# Patient Record
Sex: Female | Born: 1945 | Race: White | Hispanic: No | Marital: Single | Smoking: Former smoker
Health system: Southern US, Community
[De-identification: ages and names within clinical notes are randomized; demographics above are authoritative.]

## PROBLEM LIST (undated history)

## (undated) DIAGNOSIS — I2510 Atherosclerotic heart disease of native coronary artery without angina pectoris: Secondary | ICD-10-CM

## (undated) DIAGNOSIS — E7800 Pure hypercholesterolemia, unspecified: Secondary | ICD-10-CM

## (undated) DIAGNOSIS — I509 Heart failure, unspecified: Secondary | ICD-10-CM

## (undated) DIAGNOSIS — L409 Psoriasis, unspecified: Secondary | ICD-10-CM

## (undated) DIAGNOSIS — E119 Type 2 diabetes mellitus without complications: Secondary | ICD-10-CM

## (undated) DIAGNOSIS — I10 Essential (primary) hypertension: Secondary | ICD-10-CM

## (undated) HISTORY — PX: BREAST SURGERY: SHX581

## (undated) HISTORY — PX: BREAST EXCISIONAL BIOPSY: SUR124

---

## 1999-08-14 DIAGNOSIS — I428 Other cardiomyopathies: Secondary | ICD-10-CM | POA: Insufficient documentation

## 1999-08-14 DIAGNOSIS — Z7984 Long term (current) use of oral hypoglycemic drugs: Secondary | ICD-10-CM | POA: Insufficient documentation

## 1999-08-14 DIAGNOSIS — Z6830 Body mass index (BMI) 30.0-30.9, adult: Secondary | ICD-10-CM | POA: Insufficient documentation

## 1999-08-14 DIAGNOSIS — Z87891 Personal history of nicotine dependence: Secondary | ICD-10-CM | POA: Insufficient documentation

## 1999-08-14 DIAGNOSIS — I051 Rheumatic mitral insufficiency: Secondary | ICD-10-CM | POA: Insufficient documentation

## 1999-08-14 DIAGNOSIS — I2510 Atherosclerotic heart disease of native coronary artery without angina pectoris: Secondary | ICD-10-CM | POA: Insufficient documentation

## 1999-08-14 DIAGNOSIS — Z7901 Long term (current) use of anticoagulants: Secondary | ICD-10-CM | POA: Insufficient documentation

## 2014-08-25 DIAGNOSIS — B353 Tinea pedis: Secondary | ICD-10-CM | POA: Diagnosis not present

## 2014-08-25 DIAGNOSIS — L409 Psoriasis, unspecified: Secondary | ICD-10-CM | POA: Diagnosis not present

## 2014-08-25 DIAGNOSIS — R4189 Other symptoms and signs involving cognitive functions and awareness: Secondary | ICD-10-CM | POA: Diagnosis not present

## 2014-08-25 DIAGNOSIS — B372 Candidiasis of skin and nail: Secondary | ICD-10-CM | POA: Diagnosis not present

## 2014-08-25 DIAGNOSIS — E785 Hyperlipidemia, unspecified: Secondary | ICD-10-CM | POA: Diagnosis not present

## 2014-08-25 DIAGNOSIS — I8310 Varicose veins of unspecified lower extremity with inflammation: Secondary | ICD-10-CM | POA: Diagnosis not present

## 2014-08-25 DIAGNOSIS — I10 Essential (primary) hypertension: Secondary | ICD-10-CM | POA: Diagnosis not present

## 2014-08-25 DIAGNOSIS — E119 Type 2 diabetes mellitus without complications: Secondary | ICD-10-CM | POA: Diagnosis not present

## 2014-08-25 DIAGNOSIS — Z1389 Encounter for screening for other disorder: Secondary | ICD-10-CM | POA: Diagnosis not present

## 2014-09-30 DIAGNOSIS — L400 Psoriasis vulgaris: Secondary | ICD-10-CM | POA: Diagnosis not present

## 2014-09-30 DIAGNOSIS — L814 Other melanin hyperpigmentation: Secondary | ICD-10-CM | POA: Diagnosis not present

## 2014-09-30 DIAGNOSIS — D1801 Hemangioma of skin and subcutaneous tissue: Secondary | ICD-10-CM | POA: Diagnosis not present

## 2014-09-30 DIAGNOSIS — L821 Other seborrheic keratosis: Secondary | ICD-10-CM | POA: Diagnosis not present

## 2014-10-13 DIAGNOSIS — Z111 Encounter for screening for respiratory tuberculosis: Secondary | ICD-10-CM | POA: Diagnosis not present

## 2014-10-22 DIAGNOSIS — E119 Type 2 diabetes mellitus without complications: Secondary | ICD-10-CM | POA: Diagnosis not present

## 2014-10-22 DIAGNOSIS — H35372 Puckering of macula, left eye: Secondary | ICD-10-CM | POA: Diagnosis not present

## 2014-10-22 DIAGNOSIS — H2513 Age-related nuclear cataract, bilateral: Secondary | ICD-10-CM | POA: Diagnosis not present

## 2014-10-28 DIAGNOSIS — L400 Psoriasis vulgaris: Secondary | ICD-10-CM | POA: Diagnosis not present

## 2014-10-28 DIAGNOSIS — Z79899 Other long term (current) drug therapy: Secondary | ICD-10-CM | POA: Diagnosis not present

## 2014-11-11 ENCOUNTER — Encounter (HOSPITAL_COMMUNITY): Payer: Self-pay | Admitting: Emergency Medicine

## 2014-11-11 ENCOUNTER — Emergency Department (HOSPITAL_COMMUNITY)
Admission: EM | Admit: 2014-11-11 | Discharge: 2014-11-11 | Disposition: A | Discharge status: Home / Self Care | Payer: Medicare Other | Attending: Emergency Medicine | Admitting: Emergency Medicine

## 2014-11-11 DIAGNOSIS — E780 Pure hypercholesterolemia: Secondary | ICD-10-CM | POA: Diagnosis not present

## 2014-11-11 DIAGNOSIS — Z79899 Other long term (current) drug therapy: Secondary | ICD-10-CM | POA: Diagnosis not present

## 2014-11-11 DIAGNOSIS — L509 Urticaria, unspecified: Secondary | ICD-10-CM | POA: Insufficient documentation

## 2014-11-11 DIAGNOSIS — Z7982 Long term (current) use of aspirin: Secondary | ICD-10-CM | POA: Insufficient documentation

## 2014-11-11 DIAGNOSIS — I10 Essential (primary) hypertension: Secondary | ICD-10-CM | POA: Insufficient documentation

## 2014-11-11 DIAGNOSIS — R6889 Other general symptoms and signs: Secondary | ICD-10-CM | POA: Diagnosis not present

## 2014-11-11 DIAGNOSIS — E119 Type 2 diabetes mellitus without complications: Secondary | ICD-10-CM | POA: Diagnosis not present

## 2014-11-11 DIAGNOSIS — R220 Localized swelling, mass and lump, head: Secondary | ICD-10-CM | POA: Diagnosis present

## 2014-11-11 HISTORY — DX: Essential (primary) hypertension: I10

## 2014-11-11 HISTORY — DX: Type 2 diabetes mellitus without complications: E11.9

## 2014-11-11 HISTORY — DX: Pure hypercholesterolemia, unspecified: E78.00

## 2014-11-11 MED ORDER — DIPHENHYDRAMINE HCL 25 MG PO CAPS
ORAL_CAPSULE | ORAL | Status: AC
  Filled 2014-11-11: qty 1

## 2014-11-11 MED ORDER — OXYCODONE-ACETAMINOPHEN 5-325 MG PO TABS: 1.0000 | ORAL_TABLET | ORAL | Status: DC | PRN

## 2014-11-11 MED ORDER — DEXAMETHASONE 4 MG PO TABS
12.0000 mg | ORAL_TABLET | Freq: Once | ORAL | Status: AC
  Administered 2014-11-11: 12 mg via ORAL
  Filled 2014-11-11: qty 3

## 2014-11-11 MED ORDER — FAMOTIDINE 20 MG PO TABS
20.0000 mg | ORAL_TABLET | Freq: Once | ORAL | Status: AC
  Administered 2014-11-11: 20 mg via ORAL
  Filled 2014-11-11: qty 1

## 2014-11-11 MED ORDER — DIPHENHYDRAMINE HCL 25 MG PO CAPS
50.0000 mg | ORAL_CAPSULE | Freq: Once | ORAL | Status: AC
  Administered 2014-11-11: 50 mg via ORAL
  Filled 2014-11-11: qty 2

## 2014-11-11 NOTE — ED Notes (Signed)
Bed: WA20 Expected date:  Expected time:  Means of arrival:  Comments: EMS 

## 2014-11-11 NOTE — ED Provider Notes (Signed)
CSN: [id number]     Arrival date & time 11/11/14  1144 History   First MD Initiated Contact with Patient 11/11/14 1202     Chief Complaint  Patient presents with  . Facial Swelling    Swelling and redness underneath eyes.  . Allergic Reaction     (Consider location/radiation/quality/duration/timing/severity/associated sxs/prior Treatment) HPI   68yF with rash and facial swelling. Onset shortly before arrival. No new exposures identified. Denies any change in visual acuity. No drainage or tearing. Denies any pain. Splotchy red rash over her body.No noticeable forearms. No cough, wheezing or shortness of breath. No dizziness or lightheadedness. Denies any abdominal pain, cramping nausea or diarrhea.   Past Medical History  Diagnosis Date  . Hypertension   . Diabetes mellitus without complication   . High cholesterol    Past Surgical History  Procedure Laterality Date  . Breast surgery     No family history on file. History  Substance Use Topics  . Smoking status: Never Smoker   . Smokeless tobacco: Not on file  . Alcohol Use: No   OB History    No data available     Review of Systems  All systems reviewed and negative, other than as noted in HPI.   Allergies  Review of patient's allergies indicates no known allergies.  Home Medications   Prior to Admission medications   Medication Sig Start Date End Date Taking? Authorizing Provider  aspirin 81 MG tablet Take 81 mg by mouth daily.   Yes Historical Provider, MD  atorvastatin (LIPITOR) 80 MG tablet Take 80 mg by mouth at bedtime.   Yes Historical Provider, MD  beta carotene w/minerals (OCUVITE) tablet Take 1 tablet by mouth daily.   Yes Historical Provider, MD  diphenhydrAMINE (BENADRYL) 25 MG tablet Take 50 mg by mouth every 6 (six) hours as needed for allergies (allergic reaction).   Yes Historical Provider, MD  lisinopril (PRINIVIL,ZESTRIL) 20 MG tablet Take 20 mg by mouth at bedtime.   Yes Historical Provider, MD   metFORMIN (GLUCOPHAGE) 500 MG tablet Take 500 mg by mouth 2 (two) times daily with a meal.   Yes Historical Provider, MD  Multiple Vitamins-Minerals (CENTRUM SILVER ADULT 50+ PO) Take 1 tablet by mouth daily.   Yes Historical Provider, MD   BP 157/85 mmHg  Pulse 73  Temp(Src) 97.9 F (36.6 C) (Oral)  Resp 18  SpO2 98% Physical Exam  Constitutional: She appears well-developed and well-nourished. No distress.  HENT:  Head: Normocephalic and atraumatic.  Eyes: Right eye exhibits no discharge. Left eye exhibits no discharge.  Puffiness of both eyes. Splotchy erythema of face. Conjunctiva clear. No drainage. EOMI.   Neck: Neck supple.  Cardiovascular: Normal rate, regular rhythm and normal heart sounds.  Exam reveals no gallop and no friction rub.   No murmur heard. Pulmonary/Chest: Effort normal and breath sounds normal. No respiratory distress.  Abdominal: Soft. She exhibits no distension. There is no tenderness.  Musculoskeletal: She exhibits no edema or tenderness.  Neurological: She is alert.  Skin: Skin is warm and dry. Rash noted.  Diffuse urticarial rash.   Psychiatric: She has a normal mood and affect. Her behavior is normal. Thought content normal.  Nursing note and vitals reviewed.   ED Course  Procedures (including critical care time) Labs Review Labs Reviewed - No data to display  Imaging Review No results found.   EKG Interpretation None      MDM   Final diagnoses:  Urticarial rash  68yF with urticarial rash. No gi complaints. No respiratory complaints. HD stable. Symptomatic tx. No signs of serious reaction.     Virgel Manifold, MD 11/14/14 [phone]

## 2014-11-11 NOTE — ED Notes (Signed)
According to pt, pt was given benadryl en route by EMS.

## 2014-11-11 NOTE — ED Notes (Signed)
Upon assessment, pt has redness spreading up R arm. Pt also has red spots on L arm beginning to spread. Pt denies itchiness or pain.

## 2014-11-11 NOTE — ED Notes (Addendum)
Per EMS, Pt arrived at the adult [facility] for [facility] and seen by staff due to redness and swelling to both of her eyes. Called EMS for possible allergic reaction. Pt denies any new soaps, perfumes, etc. Pt denies recent sickness. Pt has no complaints or pain. A&Ox4. Pt ambulatory to stretcher. Pt sts she woke up this way.

## 2015-01-25 DIAGNOSIS — E119 Type 2 diabetes mellitus without complications: Secondary | ICD-10-CM | POA: Diagnosis not present

## 2015-01-25 DIAGNOSIS — H35372 Puckering of macula, left eye: Secondary | ICD-10-CM | POA: Diagnosis not present

## 2015-01-31 DIAGNOSIS — L400 Psoriasis vulgaris: Secondary | ICD-10-CM | POA: Diagnosis not present

## 2015-01-31 DIAGNOSIS — Z79899 Other long term (current) drug therapy: Secondary | ICD-10-CM | POA: Diagnosis not present

## 2015-02-07 DIAGNOSIS — H2513 Age-related nuclear cataract, bilateral: Secondary | ICD-10-CM | POA: Diagnosis not present

## 2015-02-07 DIAGNOSIS — H35372 Puckering of macula, left eye: Secondary | ICD-10-CM | POA: Diagnosis not present

## 2015-02-07 DIAGNOSIS — H1859 Other hereditary corneal dystrophies: Secondary | ICD-10-CM | POA: Diagnosis not present

## 2015-02-07 DIAGNOSIS — H04123 Dry eye syndrome of bilateral lacrimal glands: Secondary | ICD-10-CM | POA: Diagnosis not present

## 2015-02-24 DIAGNOSIS — H2511 Age-related nuclear cataract, right eye: Secondary | ICD-10-CM | POA: Diagnosis not present

## 2015-02-25 DIAGNOSIS — E119 Type 2 diabetes mellitus without complications: Secondary | ICD-10-CM | POA: Diagnosis not present

## 2015-02-25 DIAGNOSIS — L409 Psoriasis, unspecified: Secondary | ICD-10-CM | POA: Diagnosis not present

## 2015-02-25 DIAGNOSIS — R4189 Other symptoms and signs involving cognitive functions and awareness: Secondary | ICD-10-CM | POA: Diagnosis not present

## 2015-02-25 DIAGNOSIS — E785 Hyperlipidemia, unspecified: Secondary | ICD-10-CM | POA: Diagnosis not present

## 2015-02-25 DIAGNOSIS — I10 Essential (primary) hypertension: Secondary | ICD-10-CM | POA: Diagnosis not present

## 2015-04-22 DIAGNOSIS — B372 Candidiasis of skin and nail: Secondary | ICD-10-CM | POA: Diagnosis not present

## 2015-04-22 DIAGNOSIS — R42 Dizziness and giddiness: Secondary | ICD-10-CM | POA: Diagnosis not present

## 2015-05-02 DIAGNOSIS — L304 Erythema intertrigo: Secondary | ICD-10-CM | POA: Diagnosis not present

## 2015-05-02 DIAGNOSIS — L400 Psoriasis vulgaris: Secondary | ICD-10-CM | POA: Diagnosis not present

## 2015-05-24 DIAGNOSIS — Z23 Encounter for immunization: Secondary | ICD-10-CM | POA: Diagnosis not present

## 2015-08-01 DIAGNOSIS — L304 Erythema intertrigo: Secondary | ICD-10-CM | POA: Diagnosis not present

## 2015-08-01 DIAGNOSIS — L400 Psoriasis vulgaris: Secondary | ICD-10-CM | POA: Diagnosis not present

## 2015-08-30 DIAGNOSIS — Z0000 Encounter for general adult medical examination without abnormal findings: Secondary | ICD-10-CM | POA: Diagnosis not present

## 2015-08-30 DIAGNOSIS — R4189 Other symptoms and signs involving cognitive functions and awareness: Secondary | ICD-10-CM | POA: Diagnosis not present

## 2015-08-30 DIAGNOSIS — B372 Candidiasis of skin and nail: Secondary | ICD-10-CM | POA: Diagnosis not present

## 2015-08-30 DIAGNOSIS — E119 Type 2 diabetes mellitus without complications: Secondary | ICD-10-CM | POA: Diagnosis not present

## 2015-08-30 DIAGNOSIS — L409 Psoriasis, unspecified: Secondary | ICD-10-CM | POA: Diagnosis not present

## 2015-08-30 DIAGNOSIS — Z1211 Encounter for screening for malignant neoplasm of colon: Secondary | ICD-10-CM | POA: Diagnosis not present

## 2015-08-30 DIAGNOSIS — Z1389 Encounter for screening for other disorder: Secondary | ICD-10-CM | POA: Diagnosis not present

## 2015-08-30 DIAGNOSIS — E785 Hyperlipidemia, unspecified: Secondary | ICD-10-CM | POA: Diagnosis not present

## 2015-08-30 DIAGNOSIS — Z124 Encounter for screening for malignant neoplasm of cervix: Secondary | ICD-10-CM | POA: Diagnosis not present

## 2015-08-30 DIAGNOSIS — I10 Essential (primary) hypertension: Secondary | ICD-10-CM | POA: Diagnosis not present

## 2015-09-01 ENCOUNTER — Other Ambulatory Visit: Payer: Self-pay

## 2015-09-01 DIAGNOSIS — Z1231 Encounter for screening mammogram for malignant neoplasm of breast: Secondary | ICD-10-CM

## 2015-09-26 DIAGNOSIS — Z7984 Long term (current) use of oral hypoglycemic drugs: Secondary | ICD-10-CM | POA: Diagnosis not present

## 2015-09-26 DIAGNOSIS — E119 Type 2 diabetes mellitus without complications: Secondary | ICD-10-CM | POA: Diagnosis not present

## 2015-09-26 DIAGNOSIS — Z1211 Encounter for screening for malignant neoplasm of colon: Secondary | ICD-10-CM | POA: Diagnosis not present

## 2015-10-18 DIAGNOSIS — Z1211 Encounter for screening for malignant neoplasm of colon: Secondary | ICD-10-CM | POA: Diagnosis not present

## 2015-10-18 DIAGNOSIS — Z644 Discord with counselors: Secondary | ICD-10-CM | POA: Diagnosis not present

## 2015-10-31 ENCOUNTER — Ambulatory Visit
Admission: RE | Admit: 2015-10-31 | Discharge: 2015-10-31 | Disposition: A | Discharge status: Home / Self Care | Payer: Medicare Other | Source: Ambulatory Visit

## 2015-10-31 DIAGNOSIS — Z1231 Encounter for screening mammogram for malignant neoplasm of breast: Secondary | ICD-10-CM | POA: Diagnosis not present

## 2015-10-31 DIAGNOSIS — L304 Erythema intertrigo: Secondary | ICD-10-CM | POA: Diagnosis not present

## 2015-10-31 DIAGNOSIS — Z79899 Other long term (current) drug therapy: Secondary | ICD-10-CM | POA: Diagnosis not present

## 2015-10-31 DIAGNOSIS — L400 Psoriasis vulgaris: Secondary | ICD-10-CM | POA: Diagnosis not present

## 2015-12-05 DIAGNOSIS — Z961 Presence of intraocular lens: Secondary | ICD-10-CM | POA: Diagnosis not present

## 2015-12-05 DIAGNOSIS — H35372 Puckering of macula, left eye: Secondary | ICD-10-CM | POA: Diagnosis not present

## 2015-12-05 DIAGNOSIS — H1859 Other hereditary corneal dystrophies: Secondary | ICD-10-CM | POA: Diagnosis not present

## 2015-12-05 DIAGNOSIS — H2512 Age-related nuclear cataract, left eye: Secondary | ICD-10-CM | POA: Diagnosis not present

## 2015-12-17 DIAGNOSIS — N390 Urinary tract infection, site not specified: Secondary | ICD-10-CM | POA: Diagnosis not present

## 2015-12-17 DIAGNOSIS — R300 Dysuria: Secondary | ICD-10-CM | POA: Diagnosis not present

## 2016-02-02 DIAGNOSIS — Z79899 Other long term (current) drug therapy: Secondary | ICD-10-CM | POA: Diagnosis not present

## 2016-02-02 DIAGNOSIS — L400 Psoriasis vulgaris: Secondary | ICD-10-CM | POA: Diagnosis not present

## 2016-03-05 DIAGNOSIS — L409 Psoriasis, unspecified: Secondary | ICD-10-CM | POA: Diagnosis not present

## 2016-03-05 DIAGNOSIS — R4189 Other symptoms and signs involving cognitive functions and awareness: Secondary | ICD-10-CM | POA: Diagnosis not present

## 2016-03-05 DIAGNOSIS — E119 Type 2 diabetes mellitus without complications: Secondary | ICD-10-CM | POA: Diagnosis not present

## 2016-03-05 DIAGNOSIS — E785 Hyperlipidemia, unspecified: Secondary | ICD-10-CM | POA: Diagnosis not present

## 2016-03-05 DIAGNOSIS — I10 Essential (primary) hypertension: Secondary | ICD-10-CM | POA: Diagnosis not present

## 2016-03-05 DIAGNOSIS — Z7984 Long term (current) use of oral hypoglycemic drugs: Secondary | ICD-10-CM | POA: Diagnosis not present

## 2016-03-05 DIAGNOSIS — R21 Rash and other nonspecific skin eruption: Secondary | ICD-10-CM | POA: Diagnosis not present

## 2016-03-20 DIAGNOSIS — L400 Psoriasis vulgaris: Secondary | ICD-10-CM | POA: Diagnosis not present

## 2016-05-01 DIAGNOSIS — L400 Psoriasis vulgaris: Secondary | ICD-10-CM | POA: Diagnosis not present

## 2016-05-01 DIAGNOSIS — Z79899 Other long term (current) drug therapy: Secondary | ICD-10-CM | POA: Diagnosis not present

## 2016-06-11 DIAGNOSIS — Z23 Encounter for immunization: Secondary | ICD-10-CM | POA: Diagnosis not present

## 2016-06-18 DIAGNOSIS — Z1212 Encounter for screening for malignant neoplasm of rectum: Secondary | ICD-10-CM | POA: Diagnosis not present

## 2016-06-18 DIAGNOSIS — Z1211 Encounter for screening for malignant neoplasm of colon: Secondary | ICD-10-CM | POA: Diagnosis not present

## 2016-07-31 DIAGNOSIS — R195 Other fecal abnormalities: Secondary | ICD-10-CM | POA: Diagnosis not present

## 2016-08-01 DIAGNOSIS — Z79899 Other long term (current) drug therapy: Secondary | ICD-10-CM | POA: Diagnosis not present

## 2016-08-01 DIAGNOSIS — L400 Psoriasis vulgaris: Secondary | ICD-10-CM | POA: Diagnosis not present

## 2016-08-29 DIAGNOSIS — K644 Residual hemorrhoidal skin tags: Secondary | ICD-10-CM | POA: Diagnosis not present

## 2016-08-29 DIAGNOSIS — K648 Other hemorrhoids: Secondary | ICD-10-CM | POA: Diagnosis not present

## 2016-08-29 DIAGNOSIS — Q438 Other specified congenital malformations of intestine: Secondary | ICD-10-CM | POA: Diagnosis not present

## 2016-08-29 DIAGNOSIS — Z538 Procedure and treatment not carried out for other reasons: Secondary | ICD-10-CM | POA: Diagnosis not present

## 2016-08-29 DIAGNOSIS — R195 Other fecal abnormalities: Secondary | ICD-10-CM | POA: Diagnosis not present

## 2016-09-10 DIAGNOSIS — Z0000 Encounter for general adult medical examination without abnormal findings: Secondary | ICD-10-CM | POA: Diagnosis not present

## 2016-09-10 DIAGNOSIS — Z1389 Encounter for screening for other disorder: Secondary | ICD-10-CM | POA: Diagnosis not present

## 2016-09-10 DIAGNOSIS — R4189 Other symptoms and signs involving cognitive functions and awareness: Secondary | ICD-10-CM | POA: Diagnosis not present

## 2016-09-10 DIAGNOSIS — L409 Psoriasis, unspecified: Secondary | ICD-10-CM | POA: Diagnosis not present

## 2016-09-10 DIAGNOSIS — M25561 Pain in right knee: Secondary | ICD-10-CM | POA: Diagnosis not present

## 2016-09-10 DIAGNOSIS — E119 Type 2 diabetes mellitus without complications: Secondary | ICD-10-CM | POA: Diagnosis not present

## 2016-09-10 DIAGNOSIS — E785 Hyperlipidemia, unspecified: Secondary | ICD-10-CM | POA: Diagnosis not present

## 2016-09-10 DIAGNOSIS — I10 Essential (primary) hypertension: Secondary | ICD-10-CM | POA: Diagnosis not present

## 2016-09-10 DIAGNOSIS — I8310 Varicose veins of unspecified lower extremity with inflammation: Secondary | ICD-10-CM | POA: Diagnosis not present

## 2016-09-10 DIAGNOSIS — M85852 Other specified disorders of bone density and structure, left thigh: Secondary | ICD-10-CM | POA: Diagnosis not present

## 2016-09-25 ENCOUNTER — Other Ambulatory Visit: Payer: Self-pay | Admitting: Family Medicine

## 2016-09-25 ENCOUNTER — Other Ambulatory Visit: Payer: Self-pay

## 2016-09-25 DIAGNOSIS — Z1231 Encounter for screening mammogram for malignant neoplasm of breast: Secondary | ICD-10-CM

## 2016-10-23 DIAGNOSIS — M8588 Other specified disorders of bone density and structure, other site: Secondary | ICD-10-CM | POA: Diagnosis not present

## 2016-10-23 DIAGNOSIS — R195 Other fecal abnormalities: Secondary | ICD-10-CM | POA: Diagnosis not present

## 2016-10-29 DIAGNOSIS — Z79899 Other long term (current) drug therapy: Secondary | ICD-10-CM | POA: Diagnosis not present

## 2016-10-29 DIAGNOSIS — L400 Psoriasis vulgaris: Secondary | ICD-10-CM | POA: Diagnosis not present

## 2016-10-29 DIAGNOSIS — L570 Actinic keratosis: Secondary | ICD-10-CM | POA: Diagnosis not present

## 2016-10-29 DIAGNOSIS — D485 Neoplasm of uncertain behavior of skin: Secondary | ICD-10-CM | POA: Diagnosis not present

## 2016-11-05 ENCOUNTER — Ambulatory Visit
Admission: RE | Admit: 2016-11-05 | Discharge: 2016-11-05 | Disposition: A | Discharge status: Home / Self Care | Payer: Medicare Other | Source: Ambulatory Visit | Attending: Family Medicine | Admitting: Family Medicine

## 2016-11-05 DIAGNOSIS — M85852 Other specified disorders of bone density and structure, left thigh: Secondary | ICD-10-CM | POA: Diagnosis not present

## 2016-11-05 DIAGNOSIS — Z1231 Encounter for screening mammogram for malignant neoplasm of breast: Secondary | ICD-10-CM | POA: Diagnosis not present

## 2016-12-17 DIAGNOSIS — H1859 Other hereditary corneal dystrophies: Secondary | ICD-10-CM | POA: Diagnosis not present

## 2016-12-17 DIAGNOSIS — Z961 Presence of intraocular lens: Secondary | ICD-10-CM | POA: Diagnosis not present

## 2016-12-17 DIAGNOSIS — E119 Type 2 diabetes mellitus without complications: Secondary | ICD-10-CM | POA: Diagnosis not present

## 2016-12-17 DIAGNOSIS — H35372 Puckering of macula, left eye: Secondary | ICD-10-CM | POA: Diagnosis not present

## 2016-12-17 DIAGNOSIS — H2512 Age-related nuclear cataract, left eye: Secondary | ICD-10-CM | POA: Diagnosis not present

## 2017-01-28 DIAGNOSIS — D1801 Hemangioma of skin and subcutaneous tissue: Secondary | ICD-10-CM | POA: Diagnosis not present

## 2017-01-28 DIAGNOSIS — D225 Melanocytic nevi of trunk: Secondary | ICD-10-CM | POA: Diagnosis not present

## 2017-01-28 DIAGNOSIS — D2272 Melanocytic nevi of left lower limb, including hip: Secondary | ICD-10-CM | POA: Diagnosis not present

## 2017-01-28 DIAGNOSIS — L400 Psoriasis vulgaris: Secondary | ICD-10-CM | POA: Diagnosis not present

## 2017-01-28 DIAGNOSIS — L814 Other melanin hyperpigmentation: Secondary | ICD-10-CM | POA: Diagnosis not present

## 2017-01-28 DIAGNOSIS — L821 Other seborrheic keratosis: Secondary | ICD-10-CM | POA: Diagnosis not present

## 2017-02-19 DIAGNOSIS — K625 Hemorrhage of anus and rectum: Secondary | ICD-10-CM | POA: Diagnosis not present

## 2017-02-19 DIAGNOSIS — K602 Anal fissure, unspecified: Secondary | ICD-10-CM | POA: Diagnosis not present

## 2017-02-19 DIAGNOSIS — R195 Other fecal abnormalities: Secondary | ICD-10-CM | POA: Diagnosis not present

## 2017-02-20 DIAGNOSIS — I10 Essential (primary) hypertension: Secondary | ICD-10-CM | POA: Diagnosis not present

## 2017-02-20 DIAGNOSIS — E119 Type 2 diabetes mellitus without complications: Secondary | ICD-10-CM | POA: Diagnosis not present

## 2017-02-20 DIAGNOSIS — R21 Rash and other nonspecific skin eruption: Secondary | ICD-10-CM | POA: Diagnosis not present

## 2017-02-20 DIAGNOSIS — M85852 Other specified disorders of bone density and structure, left thigh: Secondary | ICD-10-CM | POA: Diagnosis not present

## 2017-02-20 DIAGNOSIS — E559 Vitamin D deficiency, unspecified: Secondary | ICD-10-CM | POA: Diagnosis not present

## 2017-02-20 DIAGNOSIS — G629 Polyneuropathy, unspecified: Secondary | ICD-10-CM | POA: Diagnosis not present

## 2017-02-20 DIAGNOSIS — Z7984 Long term (current) use of oral hypoglycemic drugs: Secondary | ICD-10-CM | POA: Diagnosis not present

## 2017-02-20 DIAGNOSIS — L409 Psoriasis, unspecified: Secondary | ICD-10-CM | POA: Diagnosis not present

## 2017-02-20 DIAGNOSIS — E785 Hyperlipidemia, unspecified: Secondary | ICD-10-CM | POA: Diagnosis not present

## 2017-02-20 DIAGNOSIS — R195 Other fecal abnormalities: Secondary | ICD-10-CM | POA: Diagnosis not present

## 2017-02-20 DIAGNOSIS — R4189 Other symptoms and signs involving cognitive functions and awareness: Secondary | ICD-10-CM | POA: Diagnosis not present

## 2017-02-21 DIAGNOSIS — I10 Essential (primary) hypertension: Secondary | ICD-10-CM | POA: Diagnosis not present

## 2017-02-21 DIAGNOSIS — G629 Polyneuropathy, unspecified: Secondary | ICD-10-CM | POA: Diagnosis not present

## 2017-02-21 DIAGNOSIS — E785 Hyperlipidemia, unspecified: Secondary | ICD-10-CM | POA: Diagnosis not present

## 2017-02-21 DIAGNOSIS — R4189 Other symptoms and signs involving cognitive functions and awareness: Secondary | ICD-10-CM | POA: Diagnosis not present

## 2017-02-21 DIAGNOSIS — K5909 Other constipation: Secondary | ICD-10-CM | POA: Diagnosis not present

## 2017-02-21 DIAGNOSIS — K625 Hemorrhage of anus and rectum: Secondary | ICD-10-CM | POA: Diagnosis not present

## 2017-02-21 DIAGNOSIS — M85852 Other specified disorders of bone density and structure, left thigh: Secondary | ICD-10-CM | POA: Diagnosis not present

## 2017-02-21 DIAGNOSIS — Z7984 Long term (current) use of oral hypoglycemic drugs: Secondary | ICD-10-CM | POA: Diagnosis not present

## 2017-02-21 DIAGNOSIS — L409 Psoriasis, unspecified: Secondary | ICD-10-CM | POA: Diagnosis not present

## 2017-02-21 DIAGNOSIS — E119 Type 2 diabetes mellitus without complications: Secondary | ICD-10-CM | POA: Diagnosis not present

## 2017-02-21 DIAGNOSIS — E559 Vitamin D deficiency, unspecified: Secondary | ICD-10-CM | POA: Diagnosis not present

## 2017-04-23 DIAGNOSIS — E119 Type 2 diabetes mellitus without complications: Secondary | ICD-10-CM | POA: Diagnosis not present

## 2017-04-23 DIAGNOSIS — K625 Hemorrhage of anus and rectum: Secondary | ICD-10-CM | POA: Diagnosis not present

## 2017-04-23 DIAGNOSIS — K5909 Other constipation: Secondary | ICD-10-CM | POA: Diagnosis not present

## 2017-04-23 DIAGNOSIS — I10 Essential (primary) hypertension: Secondary | ICD-10-CM | POA: Diagnosis not present

## 2017-04-23 DIAGNOSIS — G629 Polyneuropathy, unspecified: Secondary | ICD-10-CM | POA: Diagnosis not present

## 2017-04-23 DIAGNOSIS — E559 Vitamin D deficiency, unspecified: Secondary | ICD-10-CM | POA: Diagnosis not present

## 2017-04-23 DIAGNOSIS — Z7984 Long term (current) use of oral hypoglycemic drugs: Secondary | ICD-10-CM | POA: Diagnosis not present

## 2017-04-23 DIAGNOSIS — R4189 Other symptoms and signs involving cognitive functions and awareness: Secondary | ICD-10-CM | POA: Diagnosis not present

## 2017-04-23 DIAGNOSIS — L409 Psoriasis, unspecified: Secondary | ICD-10-CM | POA: Diagnosis not present

## 2017-04-23 DIAGNOSIS — M85852 Other specified disorders of bone density and structure, left thigh: Secondary | ICD-10-CM | POA: Diagnosis not present

## 2017-04-23 DIAGNOSIS — E785 Hyperlipidemia, unspecified: Secondary | ICD-10-CM | POA: Diagnosis not present

## 2017-04-29 DIAGNOSIS — L400 Psoriasis vulgaris: Secondary | ICD-10-CM | POA: Diagnosis not present

## 2017-04-29 DIAGNOSIS — Z79899 Other long term (current) drug therapy: Secondary | ICD-10-CM | POA: Diagnosis not present

## 2017-04-30 ENCOUNTER — Emergency Department (HOSPITAL_COMMUNITY)
Admission: EM | Admit: 2017-04-30 | Discharge: 2017-05-01 | Disposition: A | Discharge status: Home / Self Care | Payer: Medicare Other | Attending: Emergency Medicine | Admitting: Emergency Medicine

## 2017-04-30 ENCOUNTER — Emergency Department (HOSPITAL_COMMUNITY): Payer: Medicare Other

## 2017-04-30 ENCOUNTER — Encounter (HOSPITAL_COMMUNITY): Payer: Self-pay | Admitting: *Deleted

## 2017-04-30 DIAGNOSIS — E860 Dehydration: Secondary | ICD-10-CM | POA: Diagnosis not present

## 2017-04-30 DIAGNOSIS — I480 Paroxysmal atrial fibrillation: Secondary | ICD-10-CM | POA: Insufficient documentation

## 2017-04-30 DIAGNOSIS — Z79899 Other long term (current) drug therapy: Secondary | ICD-10-CM | POA: Diagnosis not present

## 2017-04-30 DIAGNOSIS — I10 Essential (primary) hypertension: Secondary | ICD-10-CM | POA: Diagnosis not present

## 2017-04-30 DIAGNOSIS — I959 Hypotension, unspecified: Secondary | ICD-10-CM | POA: Diagnosis not present

## 2017-04-30 DIAGNOSIS — R000 Tachycardia, unspecified: Secondary | ICD-10-CM | POA: Diagnosis not present

## 2017-04-30 DIAGNOSIS — E119 Type 2 diabetes mellitus without complications: Secondary | ICD-10-CM | POA: Insufficient documentation

## 2017-04-30 DIAGNOSIS — I4891 Unspecified atrial fibrillation: Secondary | ICD-10-CM | POA: Diagnosis not present

## 2017-04-30 DIAGNOSIS — Z7982 Long term (current) use of aspirin: Secondary | ICD-10-CM | POA: Diagnosis not present

## 2017-04-30 DIAGNOSIS — Z7984 Long term (current) use of oral hypoglycemic drugs: Secondary | ICD-10-CM | POA: Insufficient documentation

## 2017-04-30 DIAGNOSIS — R197 Diarrhea, unspecified: Secondary | ICD-10-CM | POA: Diagnosis not present

## 2017-04-30 LAB — BASIC METABOLIC PANEL
Anion gap: 11 (ref 5–15)
BUN: 20 mg/dL (ref 6–20)
CO2: 24 mmol/L (ref 22–32)
CREATININE: 1.19 mg/dL — AB (ref 0.44–1.00)
Calcium: 9.5 mg/dL (ref 8.9–10.3)
Chloride: 105 mmol/L (ref 101–111)
GFR calc non Af Amer: 45 mL/min — ABNORMAL LOW (ref >60)
GFR, EST AFRICAN AMERICAN: 52 mL/min — AB (ref >60)
Glucose, Bld: 163 mg/dL — ABNORMAL HIGH (ref 65–99)
POTASSIUM: 4 mmol/L (ref 3.5–5.1)
Sodium: 140 mmol/L (ref 135–145)

## 2017-04-30 LAB — CBC
HCT: 41.2 % (ref 36.0–46.0)
Hemoglobin: 13.7 g/dL (ref 12.0–15.0)
MCH: 29 pg (ref 26.0–34.0)
MCHC: 33.3 g/dL (ref 30.0–36.0)
MCV: 87.1 fL (ref 78.0–100.0)
Platelets: 265 10*3/uL (ref 150–400)
RBC: 4.73 MIL/uL (ref 3.87–5.11)
RDW: 13.3 % (ref 11.5–15.5)
WBC: 11.3 10*3/uL — ABNORMAL HIGH (ref 4.0–10.5)

## 2017-04-30 LAB — URINALYSIS, ROUTINE W REFLEX MICROSCOPIC
Bilirubin Urine: NEGATIVE
GLUCOSE, UA: NEGATIVE mg/dL
HGB URINE DIPSTICK: NEGATIVE
KETONES UR: 5 mg/dL — AB
Leukocytes, UA: NEGATIVE
Nitrite: NEGATIVE
Protein, ur: NEGATIVE mg/dL
Specific Gravity, Urine: 1.014 (ref 1.005–1.030)
pH: 5 (ref 5.0–8.0)

## 2017-04-30 LAB — LIPASE, BLOOD: LIPASE: 23 U/L (ref 11–51)

## 2017-04-30 LAB — I-STAT TROPONIN, ED
TROPONIN I, POC: 0.06 ng/mL (ref 0.00–0.08)
Troponin i, poc: 0.06 ng/mL (ref 0.00–0.08)

## 2017-04-30 LAB — HEPATIC FUNCTION PANEL
ALBUMIN: 3.5 g/dL (ref 3.5–5.0)
ALK PHOS: 56 U/L (ref 38–126)
ALT: 17 U/L (ref 14–54)
AST: 19 U/L (ref 15–41)
Bilirubin, Direct: 0.1 mg/dL (ref 0.1–0.5)
Indirect Bilirubin: 0.7 mg/dL (ref 0.3–0.9)
Total Bilirubin: 0.8 mg/dL (ref 0.3–1.2)
Total Protein: 5.9 g/dL — ABNORMAL LOW (ref 6.5–8.1)

## 2017-04-30 LAB — PROTIME-INR
INR: 1
Prothrombin Time: 13.1 seconds (ref 11.4–15.2)

## 2017-04-30 LAB — CBG MONITORING, ED: Glucose-Capillary: 142 mg/dL — ABNORMAL HIGH (ref 65–99)

## 2017-04-30 LAB — MAGNESIUM: MAGNESIUM: 1.9 mg/dL (ref 1.7–2.4)

## 2017-04-30 MED ORDER — RIVAROXABAN 15 MG PO TABS: 15.0000 mg | ORAL_TABLET | Freq: Every day | ORAL | Status: DC

## 2017-04-30 MED ORDER — SODIUM CHLORIDE 0.9 % IV BOLUS (SEPSIS)
1000.0000 mL | Freq: Once | INTRAVENOUS | Status: AC
  Administered 2017-04-30: 1000 mL via INTRAVENOUS

## 2017-04-30 NOTE — Discharge Instructions (Signed)
When you arrive today, you were in atrial fibrillation with RVR. This is the fast rhythm. We suspect it was due to your diarrhea and dehydration. We did not find evidence of other infections tonight. We discussed starting you on a blood thinner however, you did not what to stay and have the pharmacist begin that treatment. Please follow-up with your primary care physician in the next several days for reassessment and to discuss anticoagulation needs. If any symptoms change or worsen or you have return of your fast heart rate, please return to the nearest emergency department.

## 2017-04-30 NOTE — ED Triage Notes (Signed)
Pt is here with diarrhea and dehydration.  Pt is here with high heart rate, hypotension, dizziness.  Pt has right sided abdominal pain.  Pt going straight to ED room

## 2017-04-30 NOTE — ED Provider Notes (Signed)
[facility] DEPT Provider Note   CSN: [id number] Arrival date & time: 04/30/17  1545     History   Chief Complaint Chief Complaint  Patient presents with  . Abdominal Pain  . Weakness  . Dizziness    HPI Melissa Bishop is a 71 y.o. female.  The history is provided by the patient, a relative and medical records.  Diarrhea   This is a new problem. The current episode started more than 2 days ago. The problem occurs 5 to 10 times per day. The problem has not changed since onset.The stool consistency is described as watery. There has been no fever. Associated symptoms include abdominal pain, chills and sweats. Pertinent negatives include no vomiting, no headaches, no URI and no cough. She has tried nothing for the symptoms.    Past Medical History:  Diagnosis Date  . Diabetes mellitus without complication ([place])   . High cholesterol   . Hypertension     There are no active problems to display for this patient.   Past Surgical History:  Procedure Laterality Date  . BREAST EXCISIONAL BIOPSY Right ? more than 30 years  . BREAST SURGERY      OB History    No data available       Home Medications    Prior to Admission medications   Medication Sig Start Date End Date Taking? Authorizing Provider  aspirin 81 MG tablet Take 81 mg by mouth daily.    [provider]  atorvastatin (LIPITOR) 80 MG tablet Take 80 mg by mouth at bedtime.    [provider]  beta carotene w/minerals (OCUVITE) tablet Take 1 tablet by mouth daily.    [provider]  diphenhydrAMINE (BENADRYL) 25 MG tablet Take 50 mg by mouth every 6 (six) hours as needed for allergies (allergic reaction).    [provider]  lisinopril (PRINIVIL,ZESTRIL) 20 MG tablet Take 20 mg by mouth at bedtime.    [provider]  metFORMIN (GLUCOPHAGE) 500 MG tablet Take 500 mg by mouth 2 (two) times daily with a meal.    [provider]  Multiple Vitamins-Minerals  (CENTRUM SILVER ADULT 50+ PO) Take 1 tablet by mouth daily.    [provider]  oxyCODONE-acetaminophen (PERCOCET/ROXICET) 5-325 MG per tablet Take 1-2 tablets by mouth every 4 (four) hours as needed for severe pain. 11/11/14   Virgel Manifold, MD    Family History No family history on file.  Social History Social History  Substance Use Topics  . Smoking status: Never Smoker  . Smokeless tobacco: Never Used  . Alcohol use No     Allergies   Patient has no known allergies.   Review of Systems Review of Systems  Constitutional: Positive for chills, diaphoresis and fatigue. Negative for fever.  HENT: Negative for congestion.   Respiratory: Negative for cough, chest tightness, shortness of breath, wheezing and stridor.   Cardiovascular: Negative for chest pain, palpitations and leg swelling.  Gastrointestinal: Positive for abdominal pain, diarrhea and nausea. Negative for constipation and vomiting.  Genitourinary: Negative for enuresis and flank pain.  Musculoskeletal: Negative for back pain and neck pain.  Skin: Negative for rash.  Neurological: Positive for light-headedness. Negative for weakness, numbness and headaches.  Psychiatric/Behavioral: Negative for agitation and confusion.  All other systems reviewed and are negative.    Physical Exam Updated Vital Signs BP 105/65 (BP Location: Left Arm)   Pulse (!) 169   Temp (!) 97.4 F (36.3 C) (Oral)   Resp  18   SpO2 95%   Physical Exam  Constitutional: She appears well-developed and well-nourished. No distress.  HENT:  Head: Normocephalic and atraumatic.  Mouth/Throat: Oropharynx is clear and moist. No oropharyngeal exudate.  Eyes: Pupils are equal, round, and reactive to light. Conjunctivae are normal.  Neck: Normal range of motion. Neck supple.  Cardiovascular: Intact distal pulses.  An irregularly irregular rhythm present. Tachycardia present.   No murmur heard. A. fib with RVR on monitor and EKG.    Pulmonary/Chest: Effort normal and breath sounds normal. No respiratory distress. She has no wheezes. She exhibits no tenderness.  Abdominal: Soft. There is no tenderness.  Musculoskeletal: She exhibits no edema or tenderness.  Neurological: She is alert. No sensory deficit. She exhibits normal muscle tone.  Skin: Skin is warm and dry. Capillary refill takes less than 2 seconds. No rash noted. She is not diaphoretic. No erythema.  Psychiatric: She has a normal mood and affect.  Nursing note and vitals reviewed.    ED Treatments / Results  Labs (all labs ordered are listed, but only abnormal results are displayed) Labs Reviewed  BASIC METABOLIC PANEL - Abnormal; Notable for the following:       Result Value   Glucose, Bld 163 (*)    Creatinine, Ser 1.19 (*)    GFR calc non Af Amer 45 (*)    GFR calc Af Amer 52 (*)    All other components within normal limits  CBC - Abnormal; Notable for the following:    WBC 11.3 (*)    All other components within normal limits  URINALYSIS, ROUTINE W REFLEX MICROSCOPIC - Abnormal; Notable for the following:    Ketones, ur 5 (*)    All other components within normal limits  HEPATIC FUNCTION PANEL - Abnormal; Notable for the following:    Total Protein 5.9 (*)    All other components within normal limits  CBG MONITORING, ED - Abnormal; Notable for the following:    Glucose-Capillary 142 (*)    All other components within normal limits  URINE CULTURE  PROTIME-INR  LIPASE, BLOOD  MAGNESIUM  I-STAT TROPONIN, ED  I-STAT TROPONIN, ED  I-STAT TROPONIN, ED    EKG  EKG Interpretation  Date/Time:  Tuesday April 30 2017 17:22:53 EDT Ventricular Rate:  88 PR Interval:    QRS Duration: 123 QT Interval:  364 QTC Calculation: 441 R Axis:   -39 Text Interpretation:  Sinus rhythm Atrial premature complex Left bundle branch block When compared to prior, now sinus rhythm.  No STEMI Confirmed by Antony Blackbird [phone]) on 04/30/2017 5:34:08 PM        Radiology Dg Chest Portable 1 View  Result Date: 04/30/2017 CLINICAL DATA:  Knee admission and diarrhea. Tachycardia. Hypotension. EXAM: PORTABLE CHEST 1 VIEW COMPARISON:  No recent prior. FINDINGS: Heart size normal. No focal infiltrate. Lungs are clear. No pleural effusion or pneumothorax. No acute bony abnormality. IMPRESSION: 1.  Cardiomegaly.  No pulmonary venous congestion. 2.  No acute pulmonary abnormality identified . Electronically Signed   By: Marcello Moores  Register   On: 04/30/2017 17:30    Procedures Procedures (including critical care time)  CRITICAL CARE Performed by: Gwenyth Allegra Sulamita Lafountain Total critical care time: 35 minutes Critical care time was exclusive of separately billable procedures and treating other patients. Sustained heart rate in the 170s with atrial fibrillation and RVR. Critical care was necessary to treat or prevent imminent or life-threatening deterioration. Critical care was time spent personally by me on the following  activities: development of treatment plan with patient and/or surrogate as well as nursing, discussions with consultants, evaluation of patient's response to treatment, examination of patient, obtaining history from patient or surrogate, ordering and performing treatments and interventions, ordering and review of laboratory studies, ordering and review of radiographic studies, pulse oximetry and re-evaluation of patient's condition.    CHA2Ds2-VASc Score for Atrial Fibrillation    Patient Score  Age <65 = 0 65-74 = 1 > 75 = 2 1  Sex Female = 0 Female = 1 1  CHF History No = 0  Yes = 1 0  HTN History No = 0  Yes = 1 1  Stroke/TIA/TE History No = 0  Yes = 1 0  Vascular Disease History No = 0  Yes = 1 0  Diabetes History No = 0  Yes = 1 1  Total:  4   4.8 % stroke rate/year from a score of 4    Medications Ordered in ED Medications  Rivaroxaban (XARELTO) tablet 15 mg (not administered)  sodium chloride 0.9 % bolus 1,000 mL (0  mLs Intravenous Stopped 04/30/17 1918)  sodium chloride 0.9 % bolus 1,000 mL (0 mLs Intravenous Stopped 04/30/17 1918)     Initial Impression / Assessment and Plan / ED Course  I have reviewed the triage vital signs and the nursing notes.  Pertinent labs & imaging results that were available during my care of the patient were reviewed by me and considered in my medical decision making (see chart for details).     Ann Bohne is a 71 y.o. female with a past medical history significant for hypertension, diabetes, and hypercholesterolemia who presents from her PCP office for fatigue, diarrhea, urinary frequency, as well as tachycardia and hypotension. Patient is a comminuted by family who report that patient has been feeling bad for the last week. Patient has had 4 days of numerous episodes of watery diarrhea. Patient has had some abdominal cramping. Patient has also had urinary frequency but denies dysuria. She denies any chest pain, palpitations, shortness of breath, congestion, or cough. She does report feeling very fatigued. Patient denies any traumatic injuries. She denies any cardiac history.  On arrival, patient found to have a heart rate in the 170s. EKG was performed showing age of fibrillation with RVR. Patient denies history of this.  On exam, patient's lungs are clear. Patient has a tachycardic and irregular pulse on palpation. Patient has symmetric pulses however. Patient's chest and abdomen are nontender. No significant lower extremity edema. Patient's blood pressures in the 90s and low 100s on arrival. Normal oxygen saturation.  Based on her preceding symptoms, suspect her A. fib with RVR is secondary to her dehydration and possible infection. Patient will have workup to look for occult infection as well as for cardiac injury from her A. fib. As she does not have palpitations, chest pain, or shortness of breath, unsure as to time of onset of symptoms.  Patient will be given fluids  during her initial workup.  Wall awaiting lab testing, patient converted to sinus rhythm. Patient reports feeling no different. Patient's EKG showed a sinus rhythm with a rate in the 80s. Patient does have a left bundle branch block.  Cardiology was called and recommended patient be admitted to medicine for discussion of anticoagulation and for further workup. Anticipate speaking with medicine after workup is completed.    Diagnostic testing returned showing negative troponin. Magnesium normal. Urinalysis showed ketones likely dehydration but no evidence of infection. Electrolytes  grossly unremarkable. Chest x-ray shows no pneumonia.  Patient's ChadVASC score was 4. A discussion was held about starting anticoagulation and this was recommended. Initially, patient was receptive to starting an anticoagulant however, while awaiting for pharmacy to see them, they wanted to leave the emergency department. They did not want to start anticoagulation tonight and understanding the risks of stroke for not doing so. They will follow up with her PCP in the next few days to discuss starting blood thinners.   Patient and family had no other questions or concerns and patient was discharged in good condition.   Final Clinical Impressions(s) / ED Diagnoses   Final diagnoses:  Atrial fibrillation with RVR (HCC)  Diarrhea, unspecified type  Dehydration    New Prescriptions Discharge Medication List as of 04/30/2017 11:56 PM      Clinical Impression: 1. Atrial fibrillation with RVR ([place])   2. Diarrhea, unspecified type   3. Dehydration     Disposition: Discharge  Condition: Good  I have discussed the results, Dx and Tx plan with the pt(& family if present). He/she/they expressed understanding and agree(s) with the plan. Discharge instructions discussed at great length. Strict return precautions discussed and pt &/or family have verbalized understanding of the instructions. No further questions at time  of discharge.    Discharge Medication List as of 04/30/2017 11:56 PM      Follow Up: [name], [place] [address] [phone]  Schedule an appointment as soon as possible for a visit    [place] [address] [id number] [place] [phone]  If symptoms worsen     Ollen Rao, Gwenyth Allegra, MD 05/01/17 [phone]

## 2017-04-30 NOTE — ED Notes (Signed)
Xray at the bedside.

## 2017-05-01 ENCOUNTER — Telehealth (HOSPITAL_COMMUNITY): Payer: Self-pay | Admitting: *Deleted

## 2017-05-01 DIAGNOSIS — R4189 Other symptoms and signs involving cognitive functions and awareness: Secondary | ICD-10-CM | POA: Diagnosis not present

## 2017-05-01 DIAGNOSIS — I10 Essential (primary) hypertension: Secondary | ICD-10-CM | POA: Diagnosis not present

## 2017-05-01 DIAGNOSIS — E860 Dehydration: Secondary | ICD-10-CM | POA: Diagnosis not present

## 2017-05-01 DIAGNOSIS — Z7984 Long term (current) use of oral hypoglycemic drugs: Secondary | ICD-10-CM | POA: Diagnosis not present

## 2017-05-01 DIAGNOSIS — I480 Paroxysmal atrial fibrillation: Secondary | ICD-10-CM | POA: Diagnosis not present

## 2017-05-01 DIAGNOSIS — E785 Hyperlipidemia, unspecified: Secondary | ICD-10-CM | POA: Diagnosis not present

## 2017-05-01 DIAGNOSIS — E119 Type 2 diabetes mellitus without complications: Secondary | ICD-10-CM | POA: Diagnosis not present

## 2017-05-01 LAB — URINE CULTURE: Culture: NO GROWTH

## 2017-05-01 MED ORDER — RIVAROXABAN 20 MG PO TABS
20.0000 mg | ORAL_TABLET | Freq: Once | ORAL | Status: DC
  Filled 2017-05-01: qty 1

## 2017-05-01 MED ORDER — RIVAROXABAN (XARELTO) EDUCATION KIT FOR AFIB PATIENTS
PACK | Freq: Once | Status: DC
  Filled 2017-05-01: qty 1

## 2017-05-01 NOTE — Telephone Encounter (Signed)
Referral from ED for afib.  LMOM

## 2017-05-02 ENCOUNTER — Telehealth: Payer: Self-pay

## 2017-05-02 NOTE — Telephone Encounter (Signed)
SENT NOTES TO SCHEDULING 

## 2017-05-06 ENCOUNTER — Ambulatory Visit (HOSPITAL_COMMUNITY)
Admission: RE | Admit: 2017-05-06 | Discharge: 2017-05-06 | Disposition: A | Discharge status: Home / Self Care | Payer: Medicare Other | Source: Ambulatory Visit | Attending: Nurse Practitioner | Admitting: Nurse Practitioner

## 2017-05-06 ENCOUNTER — Encounter (HOSPITAL_COMMUNITY): Payer: Self-pay | Admitting: Nurse Practitioner

## 2017-05-06 VITALS — BP 126/64 | HR 67 | Ht 69.0 in | Wt 235.8 lb

## 2017-05-06 DIAGNOSIS — E7800 Pure hypercholesterolemia, unspecified: Secondary | ICD-10-CM | POA: Diagnosis not present

## 2017-05-06 DIAGNOSIS — I480 Paroxysmal atrial fibrillation: Secondary | ICD-10-CM | POA: Insufficient documentation

## 2017-05-06 DIAGNOSIS — I10 Essential (primary) hypertension: Secondary | ICD-10-CM | POA: Insufficient documentation

## 2017-05-06 DIAGNOSIS — E119 Type 2 diabetes mellitus without complications: Secondary | ICD-10-CM | POA: Diagnosis not present

## 2017-05-06 DIAGNOSIS — Z7982 Long term (current) use of aspirin: Secondary | ICD-10-CM | POA: Diagnosis not present

## 2017-05-06 DIAGNOSIS — Z7901 Long term (current) use of anticoagulants: Secondary | ICD-10-CM | POA: Diagnosis not present

## 2017-05-06 DIAGNOSIS — Z7984 Long term (current) use of oral hypoglycemic drugs: Secondary | ICD-10-CM | POA: Diagnosis not present

## 2017-05-06 DIAGNOSIS — Z79899 Other long term (current) drug therapy: Secondary | ICD-10-CM | POA: Insufficient documentation

## 2017-05-07 NOTE — Progress Notes (Signed)
Primary Care Physician: [name], MD Referring Physician: Dr. Fabio Neighbors Melissa Bishop is a 71 y.o. female with a h/o cognitive impairment, DM,HTN, that is in the afib clinic for f/u of new onset of afib in the setting of diarrhea/dehydration/hypokalemia. Pt converted in the ER with rehydration. She was placed on Eliquis 5 mg bid for a chadsvasc score of at least 4 by her PCP on f/u visit from ER, as pt did not want to wait any longer in the ER for a pharmacist  to discuss with her.She is here with her caregiver niece today as well as her 28 year old mother.  She is in SR today. Has not noted any further irregular heart beat. Diarrhea resolved. She does not drink large amounts of caffeine,no alcohol or tobacco. Does not snore per family.She feels well today.   Today, she denies symptoms of palpitations, chest pain, shortness of breath, orthopnea, PND, lower extremity edema, dizziness, presyncope, syncope, or neurologic sequela. The patient is tolerating medications without difficulties and is otherwise without complaint today.   Past Medical History:  Diagnosis Date  . Diabetes mellitus without complication ([place])   . High cholesterol   . Hypertension    Past Surgical History:  Procedure Laterality Date  . BREAST EXCISIONAL BIOPSY Right ? more than 30 years  . BREAST SURGERY      Current Outpatient Prescriptions  Medication Sig Dispense Refill  . apixaban (ELIQUIS) 5 MG TABS tablet Take 5 mg by mouth 2 (two) times daily.    Marland Kitchen aspirin 81 MG tablet Take 81 mg by mouth daily.    Marland Kitchen atorvastatin (LIPITOR) 80 MG tablet Take 80 mg by mouth at bedtime.    . beta carotene w/minerals (OCUVITE) tablet Take 1 tablet by mouth daily.    . cholecalciferol (VITAMIN D) 1000 units tablet Take 2,000 Units by mouth daily.    . diphenhydrAMINE (BENADRYL) 25 MG tablet Take 50 mg by mouth every 6 (six) hours as needed for allergies (allergic reaction).    Marland Kitchen lisinopril (PRINIVIL,ZESTRIL) 20 MG tablet  Take 20 mg by mouth at bedtime.    . metFORMIN (GLUCOPHAGE) 500 MG tablet Take 500 mg by mouth 2 (two) times daily with a meal.    . Multiple Vitamins-Minerals (CENTRUM SILVER ADULT 50+ PO) Take 1 tablet by mouth daily.    Marland Kitchen Ustekinumab (STELARA) 45 MG/0.5ML SOLN Inject 45 mg into the skin as directed. Every 90 days    . Wheat Dextrin (BENEFIBER DRINK MIX PO) Take 1 Package by mouth as needed (constipation).     No current facility-administered medications for this encounter.     No Known Allergies  Social History   Social History  . Marital status: Single    Spouse name: N/A  . Number of children: N/A  . Years of education: N/A   Occupational History  . Not on file.   Social History Main Topics  . Smoking status: Never Smoker  . Smokeless tobacco: Never Used  . Alcohol use No  . Drug use: No  . Sexual activity: Not on file   Other Topics Concern  . Not on file   Social History Narrative  . No narrative on file    No family history on file.  ROS- All systems are reviewed and negative except as per the HPI above  Physical Exam: Vitals:   05/06/17 1411  BP: 126/64  Pulse: 67  Weight: 235 lb 12.8 oz (107 kg)  Height: 5\' 9"  (  1.753 m)   Wt Readings from Last 3 Encounters:  05/06/17 235 lb 12.8 oz (107 kg)  04/30/17 232 lb (105.2 kg)    Labs: Lab Results  Component Value Date   NA 140 04/30/2017   K 4.0 04/30/2017   CL 105 04/30/2017   CO2 24 04/30/2017   GLUCOSE 163 (H) 04/30/2017   BUN 20 04/30/2017   CREATININE 1.19 (H) 04/30/2017   CALCIUM 9.5 04/30/2017   MG 1.9 04/30/2017   Lab Results  Component Value Date   INR 1.00 04/30/2017   No results found for: CHOL, HDL, LDLCALC, TRIG   GEN- The patient is well appearing, alert and oriented x 3 today.   Head- normocephalic, atraumatic Eyes-  Sclera clear, conjunctiva pink Ears- hearing intact Oropharynx- clear Neck- supple, no JVP Lymph- no cervical lymphadenopathy Lungs- Clear to ausculation  bilaterally, normal work of breathing Heart- Regular rate and rhythm, no murmurs, rubs or gallops, PMI not laterally displaced GI- soft, NT, ND, + BS Extremities- no clubbing, cyanosis, or edema MS- no significant deformity or atrophy Skin- no rash or lesion Psych- euthymic mood, full affect Neuro- strength and sensation are intact  EKG-NSR at 67 bpm, Pr int 136 ms, qrs int 102 ms, qtc 416 ms, IRBBB, LVH with repolarization abnormality Epic records reviewed    Assessment and Plan: 1. Paroxysmal new onset afib  Possibly secondary to dehydration and hypokalemia,resolved General education re afib Triggers discussed No rate control for now unless afib returns Echo   2. Chadsvasc score of at least 4(female,age,htn,dm) Continue eliquis 5 mg bid Denies bleeding history Bleeding precautions discussed Can stop 81 mg asa as it may increase bleeding tendencies  Once echo results are known, will decide if she needs referral to general cardiology or return to PCP  Melissa Bishop, [facility] [address] [place], [place] [address] [phone]

## 2017-05-13 ENCOUNTER — Other Ambulatory Visit (HOSPITAL_COMMUNITY): Payer: Medicare Other

## 2017-05-13 ENCOUNTER — Ambulatory Visit (HOSPITAL_COMMUNITY)
Admission: RE | Admit: 2017-05-13 | Discharge: 2017-05-13 | Disposition: A | Discharge status: Home / Self Care | Payer: Medicare Other | Source: Ambulatory Visit | Attending: Nurse Practitioner | Admitting: Nurse Practitioner

## 2017-05-13 DIAGNOSIS — I480 Paroxysmal atrial fibrillation: Secondary | ICD-10-CM | POA: Insufficient documentation

## 2017-05-13 DIAGNOSIS — E785 Hyperlipidemia, unspecified: Secondary | ICD-10-CM | POA: Insufficient documentation

## 2017-05-13 DIAGNOSIS — E119 Type 2 diabetes mellitus without complications: Secondary | ICD-10-CM | POA: Insufficient documentation

## 2017-05-13 DIAGNOSIS — I119 Hypertensive heart disease without heart failure: Secondary | ICD-10-CM | POA: Insufficient documentation

## 2017-05-17 ENCOUNTER — Encounter (HOSPITAL_COMMUNITY): Payer: Self-pay | Admitting: *Deleted

## 2017-06-11 DIAGNOSIS — Z23 Encounter for immunization: Secondary | ICD-10-CM | POA: Diagnosis not present

## 2017-07-29 DIAGNOSIS — Z79899 Other long term (current) drug therapy: Secondary | ICD-10-CM | POA: Diagnosis not present

## 2017-07-29 DIAGNOSIS — L400 Psoriasis vulgaris: Secondary | ICD-10-CM | POA: Diagnosis not present

## 2017-08-27 ENCOUNTER — Telehealth (HOSPITAL_COMMUNITY): Payer: Self-pay | Admitting: *Deleted

## 2017-08-27 NOTE — Telephone Encounter (Signed)
Pt daughter cld to find out if patient should follow up with general cards or if she should follow up with PCP.  Per Butch Penny if PCP is willing to follow her on her blood thinner she can see PCP and if the primary feels pt needs cardiologist, she will refer to one as well.  She  Understood and they prefer to see PCP as of now

## 2017-08-29 DIAGNOSIS — G629 Polyneuropathy, unspecified: Secondary | ICD-10-CM | POA: Diagnosis not present

## 2017-08-29 DIAGNOSIS — M85852 Other specified disorders of bone density and structure, left thigh: Secondary | ICD-10-CM | POA: Diagnosis not present

## 2017-08-29 DIAGNOSIS — R4189 Other symptoms and signs involving cognitive functions and awareness: Secondary | ICD-10-CM | POA: Diagnosis not present

## 2017-08-29 DIAGNOSIS — Z7984 Long term (current) use of oral hypoglycemic drugs: Secondary | ICD-10-CM | POA: Diagnosis not present

## 2017-08-29 DIAGNOSIS — K625 Hemorrhage of anus and rectum: Secondary | ICD-10-CM | POA: Diagnosis not present

## 2017-08-29 DIAGNOSIS — I10 Essential (primary) hypertension: Secondary | ICD-10-CM | POA: Diagnosis not present

## 2017-08-29 DIAGNOSIS — E119 Type 2 diabetes mellitus without complications: Secondary | ICD-10-CM | POA: Diagnosis not present

## 2017-08-29 DIAGNOSIS — E559 Vitamin D deficiency, unspecified: Secondary | ICD-10-CM | POA: Diagnosis not present

## 2017-08-29 DIAGNOSIS — L409 Psoriasis, unspecified: Secondary | ICD-10-CM | POA: Diagnosis not present

## 2017-08-29 DIAGNOSIS — K5909 Other constipation: Secondary | ICD-10-CM | POA: Diagnosis not present

## 2017-08-29 DIAGNOSIS — E785 Hyperlipidemia, unspecified: Secondary | ICD-10-CM | POA: Diagnosis not present

## 2017-09-03 DIAGNOSIS — Z79899 Other long term (current) drug therapy: Secondary | ICD-10-CM | POA: Diagnosis not present

## 2017-09-03 DIAGNOSIS — I480 Paroxysmal atrial fibrillation: Secondary | ICD-10-CM | POA: Diagnosis not present

## 2017-09-03 DIAGNOSIS — Z9189 Other specified personal risk factors, not elsewhere classified: Secondary | ICD-10-CM | POA: Diagnosis not present

## 2017-09-03 DIAGNOSIS — R4189 Other symptoms and signs involving cognitive functions and awareness: Secondary | ICD-10-CM | POA: Diagnosis not present

## 2017-09-03 DIAGNOSIS — I10 Essential (primary) hypertension: Secondary | ICD-10-CM | POA: Diagnosis not present

## 2017-09-03 DIAGNOSIS — H6121 Impacted cerumen, right ear: Secondary | ICD-10-CM | POA: Diagnosis not present

## 2017-09-03 DIAGNOSIS — E785 Hyperlipidemia, unspecified: Secondary | ICD-10-CM | POA: Diagnosis not present

## 2017-09-03 DIAGNOSIS — E1142 Type 2 diabetes mellitus with diabetic polyneuropathy: Secondary | ICD-10-CM | POA: Diagnosis not present

## 2017-09-03 DIAGNOSIS — M85852 Other specified disorders of bone density and structure, left thigh: Secondary | ICD-10-CM | POA: Diagnosis not present

## 2017-09-03 DIAGNOSIS — E559 Vitamin D deficiency, unspecified: Secondary | ICD-10-CM | POA: Diagnosis not present

## 2017-09-03 DIAGNOSIS — L409 Psoriasis, unspecified: Secondary | ICD-10-CM | POA: Diagnosis not present

## 2017-09-03 DIAGNOSIS — G629 Polyneuropathy, unspecified: Secondary | ICD-10-CM | POA: Diagnosis not present

## 2017-10-16 ENCOUNTER — Other Ambulatory Visit: Payer: Self-pay | Admitting: Family Medicine

## 2017-10-16 DIAGNOSIS — Z139 Encounter for screening, unspecified: Secondary | ICD-10-CM

## 2017-10-28 DIAGNOSIS — L400 Psoriasis vulgaris: Secondary | ICD-10-CM | POA: Diagnosis not present

## 2017-10-28 DIAGNOSIS — Z79899 Other long term (current) drug therapy: Secondary | ICD-10-CM | POA: Diagnosis not present

## 2017-11-12 ENCOUNTER — Ambulatory Visit
Admission: RE | Admit: 2017-11-12 | Discharge: 2017-11-12 | Disposition: A | Discharge status: Home / Self Care | Payer: Medicare Other | Source: Ambulatory Visit | Attending: Family Medicine | Admitting: Family Medicine

## 2017-11-12 DIAGNOSIS — Z139 Encounter for screening, unspecified: Secondary | ICD-10-CM

## 2017-11-12 DIAGNOSIS — Z1231 Encounter for screening mammogram for malignant neoplasm of breast: Secondary | ICD-10-CM | POA: Diagnosis not present

## 2017-11-13 ENCOUNTER — Other Ambulatory Visit: Payer: Self-pay | Admitting: Family Medicine

## 2017-11-13 DIAGNOSIS — R928 Other abnormal and inconclusive findings on diagnostic imaging of breast: Secondary | ICD-10-CM

## 2017-11-18 ENCOUNTER — Ambulatory Visit
Admission: RE | Admit: 2017-11-18 | Discharge: 2017-11-18 | Disposition: A | Discharge status: Home / Self Care | Payer: Medicare Other | Source: Ambulatory Visit | Attending: Family Medicine | Admitting: Family Medicine

## 2017-11-18 DIAGNOSIS — R928 Other abnormal and inconclusive findings on diagnostic imaging of breast: Secondary | ICD-10-CM

## 2017-11-18 DIAGNOSIS — N6001 Solitary cyst of right breast: Secondary | ICD-10-CM | POA: Diagnosis not present

## 2017-12-17 DIAGNOSIS — H35372 Puckering of macula, left eye: Secondary | ICD-10-CM | POA: Diagnosis not present

## 2017-12-17 DIAGNOSIS — E119 Type 2 diabetes mellitus without complications: Secondary | ICD-10-CM | POA: Diagnosis not present

## 2017-12-17 DIAGNOSIS — H1859 Other hereditary corneal dystrophies: Secondary | ICD-10-CM | POA: Diagnosis not present

## 2017-12-17 DIAGNOSIS — Z961 Presence of intraocular lens: Secondary | ICD-10-CM | POA: Diagnosis not present

## 2017-12-17 DIAGNOSIS — H2512 Age-related nuclear cataract, left eye: Secondary | ICD-10-CM | POA: Diagnosis not present

## 2017-12-30 DIAGNOSIS — H2512 Age-related nuclear cataract, left eye: Secondary | ICD-10-CM | POA: Diagnosis not present

## 2018-01-02 DIAGNOSIS — H2512 Age-related nuclear cataract, left eye: Secondary | ICD-10-CM | POA: Diagnosis not present

## 2018-01-30 DIAGNOSIS — Z79899 Other long term (current) drug therapy: Secondary | ICD-10-CM | POA: Diagnosis not present

## 2018-01-30 DIAGNOSIS — L400 Psoriasis vulgaris: Secondary | ICD-10-CM | POA: Diagnosis not present

## 2018-03-10 DIAGNOSIS — E785 Hyperlipidemia, unspecified: Secondary | ICD-10-CM | POA: Diagnosis not present

## 2018-03-10 DIAGNOSIS — M85852 Other specified disorders of bone density and structure, left thigh: Secondary | ICD-10-CM | POA: Diagnosis not present

## 2018-03-10 DIAGNOSIS — E1142 Type 2 diabetes mellitus with diabetic polyneuropathy: Secondary | ICD-10-CM | POA: Diagnosis not present

## 2018-03-10 DIAGNOSIS — I480 Paroxysmal atrial fibrillation: Secondary | ICD-10-CM | POA: Diagnosis not present

## 2018-03-10 DIAGNOSIS — L409 Psoriasis, unspecified: Secondary | ICD-10-CM | POA: Diagnosis not present

## 2018-03-10 DIAGNOSIS — R4189 Other symptoms and signs involving cognitive functions and awareness: Secondary | ICD-10-CM | POA: Diagnosis not present

## 2018-03-10 DIAGNOSIS — I10 Essential (primary) hypertension: Secondary | ICD-10-CM | POA: Diagnosis not present

## 2018-03-10 DIAGNOSIS — E559 Vitamin D deficiency, unspecified: Secondary | ICD-10-CM | POA: Diagnosis not present

## 2018-03-10 DIAGNOSIS — B353 Tinea pedis: Secondary | ICD-10-CM | POA: Diagnosis not present

## 2018-03-10 DIAGNOSIS — Z79899 Other long term (current) drug therapy: Secondary | ICD-10-CM | POA: Diagnosis not present

## 2018-03-17 DIAGNOSIS — Z961 Presence of intraocular lens: Secondary | ICD-10-CM | POA: Diagnosis not present

## 2018-03-17 DIAGNOSIS — H35372 Puckering of macula, left eye: Secondary | ICD-10-CM | POA: Diagnosis not present

## 2018-03-17 DIAGNOSIS — E119 Type 2 diabetes mellitus without complications: Secondary | ICD-10-CM | POA: Diagnosis not present

## 2018-03-25 DIAGNOSIS — L602 Onychogryphosis: Secondary | ICD-10-CM | POA: Diagnosis not present

## 2018-03-25 DIAGNOSIS — E1351 Other specified diabetes mellitus with diabetic peripheral angiopathy without gangrene: Secondary | ICD-10-CM | POA: Diagnosis not present

## 2018-03-25 DIAGNOSIS — B353 Tinea pedis: Secondary | ICD-10-CM | POA: Diagnosis not present

## 2018-03-25 DIAGNOSIS — M21962 Unspecified acquired deformity of left lower leg: Secondary | ICD-10-CM | POA: Diagnosis not present

## 2018-03-25 DIAGNOSIS — M21961 Unspecified acquired deformity of right lower leg: Secondary | ICD-10-CM | POA: Diagnosis not present

## 2018-04-29 DIAGNOSIS — Z79899 Other long term (current) drug therapy: Secondary | ICD-10-CM | POA: Diagnosis not present

## 2018-04-29 DIAGNOSIS — L400 Psoriasis vulgaris: Secondary | ICD-10-CM | POA: Diagnosis not present

## 2018-05-27 DIAGNOSIS — H6691 Otitis media, unspecified, right ear: Secondary | ICD-10-CM | POA: Diagnosis not present

## 2018-06-03 DIAGNOSIS — E1151 Type 2 diabetes mellitus with diabetic peripheral angiopathy without gangrene: Secondary | ICD-10-CM | POA: Diagnosis not present

## 2018-06-03 DIAGNOSIS — L602 Onychogryphosis: Secondary | ICD-10-CM | POA: Diagnosis not present

## 2018-07-02 DIAGNOSIS — Z23 Encounter for immunization: Secondary | ICD-10-CM | POA: Diagnosis not present

## 2018-07-29 DIAGNOSIS — Z79899 Other long term (current) drug therapy: Secondary | ICD-10-CM | POA: Diagnosis not present

## 2018-07-29 DIAGNOSIS — L400 Psoriasis vulgaris: Secondary | ICD-10-CM | POA: Diagnosis not present

## 2018-08-01 DIAGNOSIS — R296 Repeated falls: Secondary | ICD-10-CM | POA: Diagnosis not present

## 2018-08-19 DIAGNOSIS — E1151 Type 2 diabetes mellitus with diabetic peripheral angiopathy without gangrene: Secondary | ICD-10-CM | POA: Diagnosis not present

## 2018-08-19 DIAGNOSIS — L84 Corns and callosities: Secondary | ICD-10-CM | POA: Diagnosis not present

## 2018-08-19 DIAGNOSIS — L602 Onychogryphosis: Secondary | ICD-10-CM | POA: Diagnosis not present

## 2018-08-19 DIAGNOSIS — M21962 Unspecified acquired deformity of left lower leg: Secondary | ICD-10-CM | POA: Diagnosis not present

## 2018-08-19 DIAGNOSIS — R2689 Other abnormalities of gait and mobility: Secondary | ICD-10-CM | POA: Diagnosis not present

## 2018-08-19 DIAGNOSIS — M6281 Muscle weakness (generalized): Secondary | ICD-10-CM | POA: Diagnosis not present

## 2018-08-19 DIAGNOSIS — M21961 Unspecified acquired deformity of right lower leg: Secondary | ICD-10-CM | POA: Diagnosis not present

## 2018-08-26 DIAGNOSIS — L409 Psoriasis, unspecified: Secondary | ICD-10-CM | POA: Diagnosis not present

## 2018-08-26 DIAGNOSIS — Z79899 Other long term (current) drug therapy: Secondary | ICD-10-CM | POA: Diagnosis not present

## 2018-08-26 DIAGNOSIS — R293 Abnormal posture: Secondary | ICD-10-CM | POA: Diagnosis not present

## 2018-08-26 DIAGNOSIS — I10 Essential (primary) hypertension: Secondary | ICD-10-CM | POA: Diagnosis not present

## 2018-08-26 DIAGNOSIS — M85852 Other specified disorders of bone density and structure, left thigh: Secondary | ICD-10-CM | POA: Diagnosis not present

## 2018-08-26 DIAGNOSIS — B353 Tinea pedis: Secondary | ICD-10-CM | POA: Diagnosis not present

## 2018-08-26 DIAGNOSIS — R262 Difficulty in walking, not elsewhere classified: Secondary | ICD-10-CM | POA: Diagnosis not present

## 2018-08-26 DIAGNOSIS — I480 Paroxysmal atrial fibrillation: Secondary | ICD-10-CM | POA: Diagnosis not present

## 2018-08-26 DIAGNOSIS — R2681 Unsteadiness on feet: Secondary | ICD-10-CM | POA: Diagnosis not present

## 2018-08-26 DIAGNOSIS — R4189 Other symptoms and signs involving cognitive functions and awareness: Secondary | ICD-10-CM | POA: Diagnosis not present

## 2018-08-26 DIAGNOSIS — E559 Vitamin D deficiency, unspecified: Secondary | ICD-10-CM | POA: Diagnosis not present

## 2018-08-26 DIAGNOSIS — E785 Hyperlipidemia, unspecified: Secondary | ICD-10-CM | POA: Diagnosis not present

## 2018-08-26 DIAGNOSIS — E1142 Type 2 diabetes mellitus with diabetic polyneuropathy: Secondary | ICD-10-CM | POA: Diagnosis not present

## 2018-08-26 DIAGNOSIS — M6281 Muscle weakness (generalized): Secondary | ICD-10-CM | POA: Diagnosis not present

## 2018-09-01 DIAGNOSIS — M6281 Muscle weakness (generalized): Secondary | ICD-10-CM | POA: Diagnosis not present

## 2018-09-01 DIAGNOSIS — R2681 Unsteadiness on feet: Secondary | ICD-10-CM | POA: Diagnosis not present

## 2018-09-01 DIAGNOSIS — R293 Abnormal posture: Secondary | ICD-10-CM | POA: Diagnosis not present

## 2018-09-01 DIAGNOSIS — R262 Difficulty in walking, not elsewhere classified: Secondary | ICD-10-CM | POA: Diagnosis not present

## 2018-09-02 DIAGNOSIS — I10 Essential (primary) hypertension: Secondary | ICD-10-CM | POA: Diagnosis not present

## 2018-09-02 DIAGNOSIS — R2681 Unsteadiness on feet: Secondary | ICD-10-CM | POA: Diagnosis not present

## 2018-09-02 DIAGNOSIS — E1142 Type 2 diabetes mellitus with diabetic polyneuropathy: Secondary | ICD-10-CM | POA: Diagnosis not present

## 2018-09-02 DIAGNOSIS — L409 Psoriasis, unspecified: Secondary | ICD-10-CM | POA: Diagnosis not present

## 2018-09-02 DIAGNOSIS — R293 Abnormal posture: Secondary | ICD-10-CM | POA: Diagnosis not present

## 2018-09-02 DIAGNOSIS — R296 Repeated falls: Secondary | ICD-10-CM | POA: Diagnosis not present

## 2018-09-02 DIAGNOSIS — I480 Paroxysmal atrial fibrillation: Secondary | ICD-10-CM | POA: Diagnosis not present

## 2018-09-02 DIAGNOSIS — E785 Hyperlipidemia, unspecified: Secondary | ICD-10-CM | POA: Diagnosis not present

## 2018-09-02 DIAGNOSIS — I8310 Varicose veins of unspecified lower extremity with inflammation: Secondary | ICD-10-CM | POA: Diagnosis not present

## 2018-09-02 DIAGNOSIS — M6281 Muscle weakness (generalized): Secondary | ICD-10-CM | POA: Diagnosis not present

## 2018-09-02 DIAGNOSIS — M85852 Other specified disorders of bone density and structure, left thigh: Secondary | ICD-10-CM | POA: Diagnosis not present

## 2018-09-02 DIAGNOSIS — Z79899 Other long term (current) drug therapy: Secondary | ICD-10-CM | POA: Diagnosis not present

## 2018-09-02 DIAGNOSIS — E559 Vitamin D deficiency, unspecified: Secondary | ICD-10-CM | POA: Diagnosis not present

## 2018-09-02 DIAGNOSIS — R4189 Other symptoms and signs involving cognitive functions and awareness: Secondary | ICD-10-CM | POA: Diagnosis not present

## 2018-09-02 DIAGNOSIS — R262 Difficulty in walking, not elsewhere classified: Secondary | ICD-10-CM | POA: Diagnosis not present

## 2018-09-08 DIAGNOSIS — R262 Difficulty in walking, not elsewhere classified: Secondary | ICD-10-CM | POA: Diagnosis not present

## 2018-09-08 DIAGNOSIS — M6281 Muscle weakness (generalized): Secondary | ICD-10-CM | POA: Diagnosis not present

## 2018-09-08 DIAGNOSIS — R2681 Unsteadiness on feet: Secondary | ICD-10-CM | POA: Diagnosis not present

## 2018-09-08 DIAGNOSIS — R293 Abnormal posture: Secondary | ICD-10-CM | POA: Diagnosis not present

## 2018-09-09 DIAGNOSIS — R293 Abnormal posture: Secondary | ICD-10-CM | POA: Diagnosis not present

## 2018-09-09 DIAGNOSIS — M6281 Muscle weakness (generalized): Secondary | ICD-10-CM | POA: Diagnosis not present

## 2018-09-09 DIAGNOSIS — R2681 Unsteadiness on feet: Secondary | ICD-10-CM | POA: Diagnosis not present

## 2018-09-09 DIAGNOSIS — R262 Difficulty in walking, not elsewhere classified: Secondary | ICD-10-CM | POA: Diagnosis not present

## 2018-09-15 DIAGNOSIS — R293 Abnormal posture: Secondary | ICD-10-CM | POA: Diagnosis not present

## 2018-09-15 DIAGNOSIS — R2681 Unsteadiness on feet: Secondary | ICD-10-CM | POA: Diagnosis not present

## 2018-09-15 DIAGNOSIS — M6281 Muscle weakness (generalized): Secondary | ICD-10-CM | POA: Diagnosis not present

## 2018-09-15 DIAGNOSIS — R262 Difficulty in walking, not elsewhere classified: Secondary | ICD-10-CM | POA: Diagnosis not present

## 2018-09-16 DIAGNOSIS — M6281 Muscle weakness (generalized): Secondary | ICD-10-CM | POA: Diagnosis not present

## 2018-09-16 DIAGNOSIS — R293 Abnormal posture: Secondary | ICD-10-CM | POA: Diagnosis not present

## 2018-09-16 DIAGNOSIS — R262 Difficulty in walking, not elsewhere classified: Secondary | ICD-10-CM | POA: Diagnosis not present

## 2018-09-16 DIAGNOSIS — R2681 Unsteadiness on feet: Secondary | ICD-10-CM | POA: Diagnosis not present

## 2018-09-22 DIAGNOSIS — R293 Abnormal posture: Secondary | ICD-10-CM | POA: Diagnosis not present

## 2018-09-22 DIAGNOSIS — R262 Difficulty in walking, not elsewhere classified: Secondary | ICD-10-CM | POA: Diagnosis not present

## 2018-09-22 DIAGNOSIS — M6281 Muscle weakness (generalized): Secondary | ICD-10-CM | POA: Diagnosis not present

## 2018-09-22 DIAGNOSIS — R2681 Unsteadiness on feet: Secondary | ICD-10-CM | POA: Diagnosis not present

## 2018-09-23 DIAGNOSIS — R262 Difficulty in walking, not elsewhere classified: Secondary | ICD-10-CM | POA: Diagnosis not present

## 2018-09-23 DIAGNOSIS — R293 Abnormal posture: Secondary | ICD-10-CM | POA: Diagnosis not present

## 2018-09-23 DIAGNOSIS — M6281 Muscle weakness (generalized): Secondary | ICD-10-CM | POA: Diagnosis not present

## 2018-09-23 DIAGNOSIS — R2681 Unsteadiness on feet: Secondary | ICD-10-CM | POA: Diagnosis not present

## 2018-09-30 DIAGNOSIS — R293 Abnormal posture: Secondary | ICD-10-CM | POA: Diagnosis not present

## 2018-09-30 DIAGNOSIS — R262 Difficulty in walking, not elsewhere classified: Secondary | ICD-10-CM | POA: Diagnosis not present

## 2018-09-30 DIAGNOSIS — M6281 Muscle weakness (generalized): Secondary | ICD-10-CM | POA: Diagnosis not present

## 2018-09-30 DIAGNOSIS — R2681 Unsteadiness on feet: Secondary | ICD-10-CM | POA: Diagnosis not present

## 2018-10-06 DIAGNOSIS — R262 Difficulty in walking, not elsewhere classified: Secondary | ICD-10-CM | POA: Diagnosis not present

## 2018-10-06 DIAGNOSIS — M6281 Muscle weakness (generalized): Secondary | ICD-10-CM | POA: Diagnosis not present

## 2018-10-06 DIAGNOSIS — R2681 Unsteadiness on feet: Secondary | ICD-10-CM | POA: Diagnosis not present

## 2018-10-06 DIAGNOSIS — R293 Abnormal posture: Secondary | ICD-10-CM | POA: Diagnosis not present

## 2018-10-07 DIAGNOSIS — R293 Abnormal posture: Secondary | ICD-10-CM | POA: Diagnosis not present

## 2018-10-07 DIAGNOSIS — M6281 Muscle weakness (generalized): Secondary | ICD-10-CM | POA: Diagnosis not present

## 2018-10-07 DIAGNOSIS — R2681 Unsteadiness on feet: Secondary | ICD-10-CM | POA: Diagnosis not present

## 2018-10-07 DIAGNOSIS — R262 Difficulty in walking, not elsewhere classified: Secondary | ICD-10-CM | POA: Diagnosis not present

## 2018-10-10 ENCOUNTER — Other Ambulatory Visit: Payer: Self-pay | Admitting: Family Medicine

## 2018-10-10 DIAGNOSIS — Z1231 Encounter for screening mammogram for malignant neoplasm of breast: Secondary | ICD-10-CM

## 2018-10-13 DIAGNOSIS — Z83518 Family history of other specified eye disorder: Secondary | ICD-10-CM | POA: Diagnosis not present

## 2018-10-13 DIAGNOSIS — H35372 Puckering of macula, left eye: Secondary | ICD-10-CM | POA: Diagnosis not present

## 2018-10-13 DIAGNOSIS — E119 Type 2 diabetes mellitus without complications: Secondary | ICD-10-CM | POA: Diagnosis not present

## 2018-10-13 DIAGNOSIS — R293 Abnormal posture: Secondary | ICD-10-CM | POA: Diagnosis not present

## 2018-10-13 DIAGNOSIS — H5315 Visual distortions of shape and size: Secondary | ICD-10-CM | POA: Diagnosis not present

## 2018-10-13 DIAGNOSIS — R262 Difficulty in walking, not elsewhere classified: Secondary | ICD-10-CM | POA: Diagnosis not present

## 2018-10-13 DIAGNOSIS — M6281 Muscle weakness (generalized): Secondary | ICD-10-CM | POA: Diagnosis not present

## 2018-10-13 DIAGNOSIS — R2681 Unsteadiness on feet: Secondary | ICD-10-CM | POA: Diagnosis not present

## 2018-10-13 DIAGNOSIS — Z961 Presence of intraocular lens: Secondary | ICD-10-CM | POA: Diagnosis not present

## 2018-10-27 IMAGING — DX DG CHEST 1V PORT
1 series · 1 of 1 positions shown · non-contrast
Comparison: No recent prior.

CLINICAL DATA: Knee admission and diarrhea. Tachycardia.
Hypotension.

EXAM:
PORTABLE CHEST 1 VIEW

[chest]
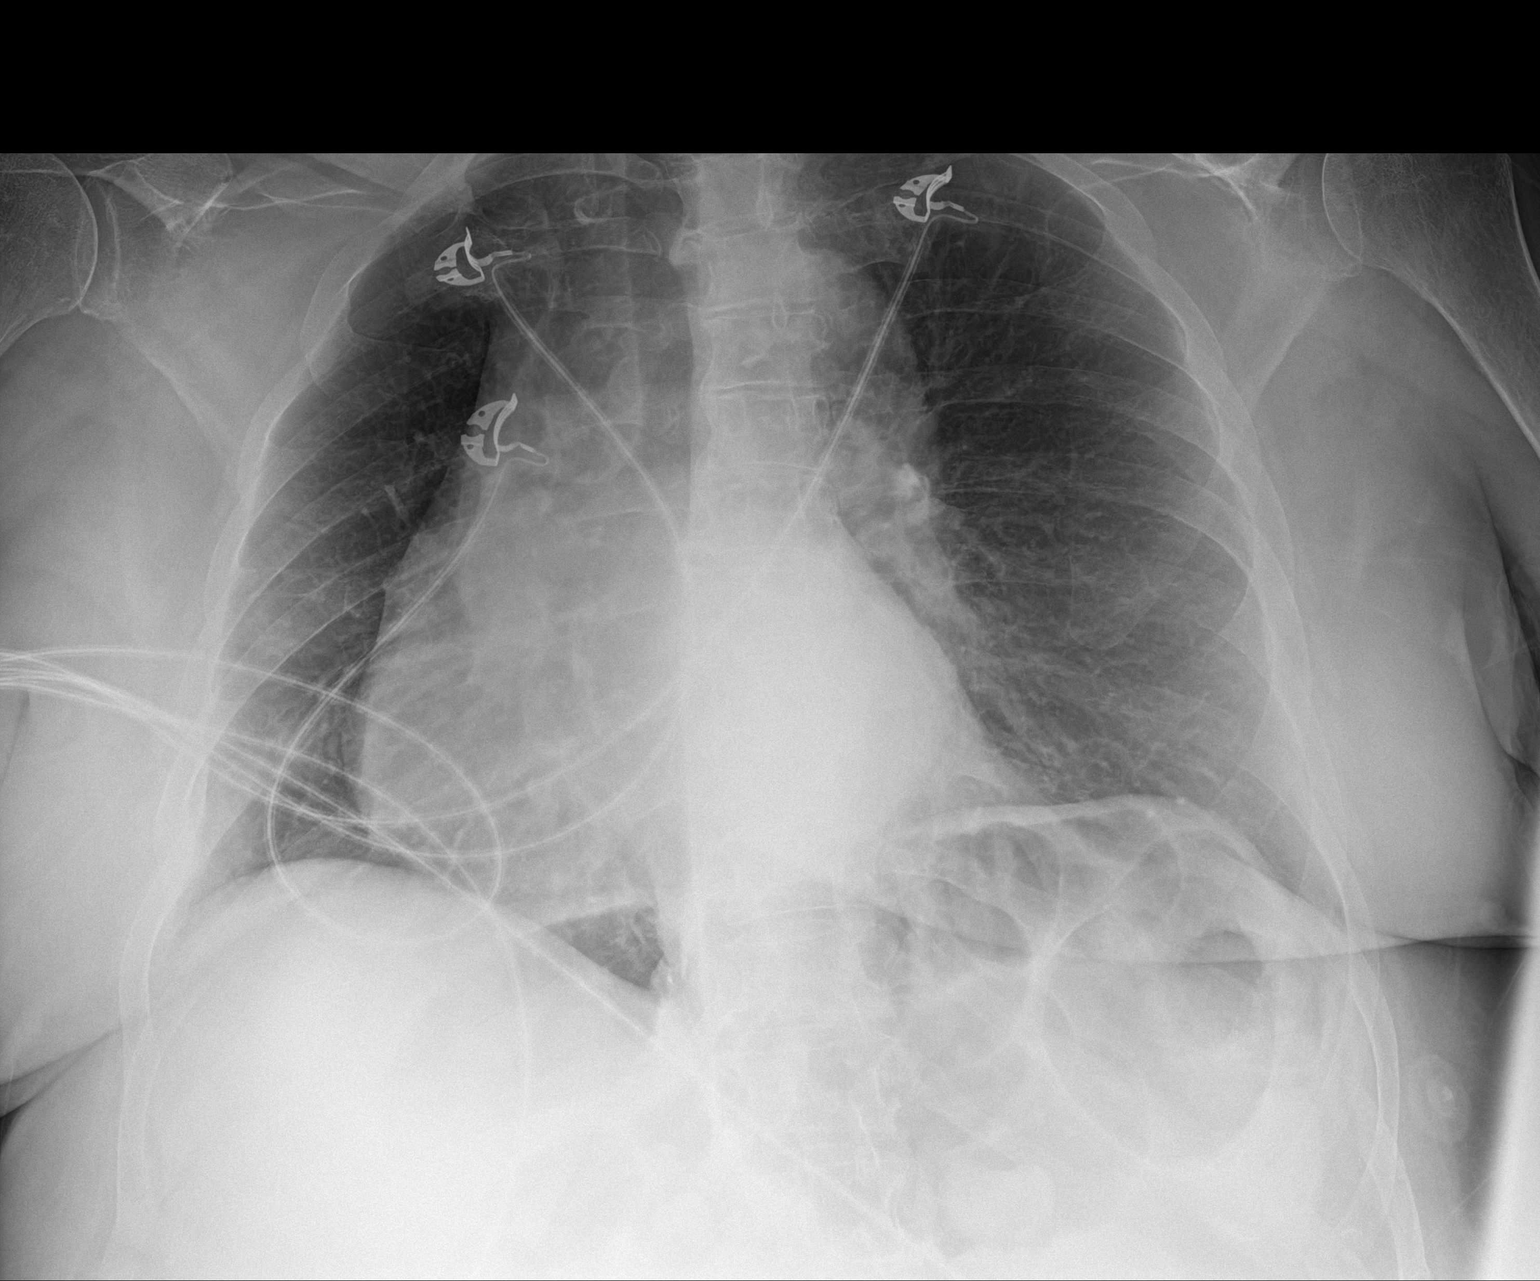

[1 of 1 positions shown; findings below may reference images not displayed]

FINDINGS: Heart size normal. No focal infiltrate. Lungs are clear. No pleural
effusion or pneumothorax. No acute bony abnormality.
IMPRESSION: 1.  Cardiomegaly.  No pulmonary venous congestion.

2.  No acute pulmonary abnormality identified .

## 2018-11-17 ENCOUNTER — Ambulatory Visit: Payer: Medicare Other

## 2018-11-17 DIAGNOSIS — E1151 Type 2 diabetes mellitus with diabetic peripheral angiopathy without gangrene: Secondary | ICD-10-CM | POA: Diagnosis not present

## 2018-11-17 DIAGNOSIS — Z79899 Other long term (current) drug therapy: Secondary | ICD-10-CM | POA: Diagnosis not present

## 2018-11-17 DIAGNOSIS — L602 Onychogryphosis: Secondary | ICD-10-CM | POA: Diagnosis not present

## 2018-11-17 DIAGNOSIS — L400 Psoriasis vulgaris: Secondary | ICD-10-CM | POA: Diagnosis not present

## 2018-11-19 DIAGNOSIS — L400 Psoriasis vulgaris: Secondary | ICD-10-CM | POA: Diagnosis not present

## 2018-11-20 ENCOUNTER — Emergency Department (HOSPITAL_COMMUNITY): Payer: Medicare Other

## 2018-11-20 ENCOUNTER — Encounter (HOSPITAL_COMMUNITY): Payer: Self-pay

## 2018-11-20 ENCOUNTER — Emergency Department (HOSPITAL_COMMUNITY)
Admission: EM | Admit: 2018-11-20 | Discharge: 2018-11-20 | Disposition: A | Discharge status: Home / Self Care | Payer: Medicare Other | Attending: Emergency Medicine | Admitting: Emergency Medicine

## 2018-11-20 ENCOUNTER — Other Ambulatory Visit: Payer: Self-pay

## 2018-11-20 DIAGNOSIS — Z7984 Long term (current) use of oral hypoglycemic drugs: Secondary | ICD-10-CM | POA: Insufficient documentation

## 2018-11-20 DIAGNOSIS — E119 Type 2 diabetes mellitus without complications: Secondary | ICD-10-CM | POA: Insufficient documentation

## 2018-11-20 DIAGNOSIS — N200 Calculus of kidney: Secondary | ICD-10-CM

## 2018-11-20 DIAGNOSIS — I10 Essential (primary) hypertension: Secondary | ICD-10-CM | POA: Insufficient documentation

## 2018-11-20 DIAGNOSIS — Z79899 Other long term (current) drug therapy: Secondary | ICD-10-CM | POA: Diagnosis not present

## 2018-11-20 DIAGNOSIS — R1031 Right lower quadrant pain: Secondary | ICD-10-CM | POA: Diagnosis present

## 2018-11-20 DIAGNOSIS — N201 Calculus of ureter: Secondary | ICD-10-CM | POA: Diagnosis not present

## 2018-11-20 DIAGNOSIS — R1110 Vomiting, unspecified: Secondary | ICD-10-CM | POA: Diagnosis not present

## 2018-11-20 DIAGNOSIS — Z7901 Long term (current) use of anticoagulants: Secondary | ICD-10-CM | POA: Insufficient documentation

## 2018-11-20 LAB — CBC WITH DIFFERENTIAL/PLATELET
Abs Immature Granulocytes: 0.01 10*3/uL (ref 0.00–0.07)
Basophils Absolute: 0 10*3/uL (ref 0.0–0.1)
Basophils Relative: 1 %
Eosinophils Absolute: 0.1 10*3/uL (ref 0.0–0.5)
Eosinophils Relative: 2 %
HCT: 43.6 % (ref 36.0–46.0)
Hemoglobin: 13.9 g/dL (ref 12.0–15.0)
Immature Granulocytes: 0 %
Lymphocytes Relative: 25 %
Lymphs Abs: 1.6 10*3/uL (ref 0.7–4.0)
MCH: 28.4 pg (ref 26.0–34.0)
MCHC: 31.9 g/dL (ref 30.0–36.0)
MCV: 89 fL (ref 80.0–100.0)
Monocytes Absolute: 0.3 10*3/uL (ref 0.1–1.0)
Monocytes Relative: 5 %
Neutro Abs: 4.3 10*3/uL (ref 1.7–7.7)
Neutrophils Relative %: 67 %
Platelets: 204 10*3/uL (ref 150–400)
RBC: 4.9 MIL/uL (ref 3.87–5.11)
RDW: 12.8 % (ref 11.5–15.5)
WBC: 6.3 10*3/uL (ref 4.0–10.5)
nRBC: 0 % (ref 0.0–0.2)

## 2018-11-20 LAB — COMPREHENSIVE METABOLIC PANEL
ALT: 21 U/L (ref 0–44)
AST: 17 U/L (ref 15–41)
Albumin: 4.6 g/dL (ref 3.5–5.0)
Alkaline Phosphatase: 84 U/L (ref 38–126)
Anion gap: 12 (ref 5–15)
BUN: 23 mg/dL (ref 8–23)
CO2: 22 mmol/L (ref 22–32)
Calcium: 9.6 mg/dL (ref 8.9–10.3)
Chloride: 111 mmol/L (ref 98–111)
Creatinine, Ser: 0.8 mg/dL (ref 0.44–1.00)
GFR calc Af Amer: >60 mL/min (ref >60)
GFR calc non Af Amer: >60 mL/min (ref >60)
Glucose, Bld: 144 mg/dL — ABNORMAL HIGH (ref 70–99)
Potassium: 3.8 mmol/L (ref 3.5–5.1)
Sodium: 145 mmol/L (ref 135–145)
Total Bilirubin: 0.7 mg/dL (ref 0.3–1.2)
Total Protein: 7.5 g/dL (ref 6.5–8.1)

## 2018-11-20 LAB — URINALYSIS, ROUTINE W REFLEX MICROSCOPIC
Bilirubin Urine: NEGATIVE
Glucose, UA: 50 mg/dL — AB
Ketones, ur: 20 mg/dL — AB
Leukocytes,Ua: NEGATIVE
Nitrite: NEGATIVE
Protein, ur: NEGATIVE mg/dL
Specific Gravity, Urine: 1.041 — ABNORMAL HIGH (ref 1.005–1.030)
pH: 6 (ref 5.0–8.0)

## 2018-11-20 LAB — LIPASE, BLOOD: Lipase: 26 U/L (ref 11–51)

## 2018-11-20 MED ORDER — ONDANSETRON HCL 4 MG/2ML IJ SOLN
4.0000 mg | Freq: Once | INTRAMUSCULAR | Status: AC
  Administered 2018-11-20: 17:00:00 4 mg via INTRAVENOUS
  Filled 2018-11-20: qty 2

## 2018-11-20 MED ORDER — SODIUM CHLORIDE 0.9 % IV BOLUS: 500.0000 mL | Freq: Once | INTRAVENOUS | Status: DC

## 2018-11-20 MED ORDER — ONDANSETRON HCL 4 MG PO TABS: 4.0000 mg | ORAL_TABLET | Freq: Four times a day (QID) | ORAL | 0 refills | Status: DC | PRN

## 2018-11-20 MED ORDER — HYDROCODONE-ACETAMINOPHEN 5-325 MG PO TABS: 1.0000 | ORAL_TABLET | Freq: Four times a day (QID) | ORAL | 0 refills | Status: DC | PRN

## 2018-11-20 MED ORDER — MORPHINE SULFATE (PF) 2 MG/ML IV SOLN
2.0000 mg | Freq: Once | INTRAVENOUS | Status: AC
  Administered 2018-11-20: 17:00:00 2 mg via INTRAVENOUS
  Filled 2018-11-20: qty 1

## 2018-11-20 MED ORDER — IOHEXOL 300 MG/ML  SOLN
100.0000 mL | Freq: Once | INTRAMUSCULAR | Status: AC | PRN
  Administered 2018-11-20: 100 mL via INTRAVENOUS

## 2018-11-20 NOTE — ED Notes (Signed)
Patient transported to CT 

## 2018-11-20 NOTE — ED Notes (Addendum)
Contact pt's niece Mickel Baas with updates at [phone]

## 2018-11-20 NOTE — ED Provider Notes (Signed)
[facility] DEPARTMENT Provider Note   CSN: [id number] Arrival date & time: 11/20/18  1638    History   Chief Complaint Chief Complaint  Patient presents with  . Flank Pain    right side  . Emesis    4x today    HPI Melissa Bishop is a 73 y.o. female.     HPI Patient presents with right-sided flank pain which started this morning.  Pain radiates to her right abdomen.  Associated with multiple episodes of vomiting.  Movement and palpation exacerbate the pain.  Patient denies diarrhea.  Normal bowel movement today.  No blood in the stool.  Patient did states she has noticed gross hematuria from time to time.  Denies dysuria or frequency.  No fever or chills.  Patient has a rash to the area which she thinks is due to her psoriasis. Past Medical History:  Diagnosis Date  . Diabetes mellitus without complication ([place])   . High cholesterol   . Hypertension     There are no active problems to display for this patient.   Past Surgical History:  Procedure Laterality Date  . BREAST EXCISIONAL BIOPSY Right ? more than 30 years  . BREAST SURGERY       OB History   No obstetric history on file.      Home Medications    Prior to Admission medications   Medication Sig Start Date End Date Taking? Authorizing Provider  apixaban (ELIQUIS) 5 MG TABS tablet Take 5 mg by mouth 2 (two) times daily.   Yes [provider]  atorvastatin (LIPITOR) 40 MG tablet Take 40 mg by mouth daily.   Yes [provider]  docusate sodium (COLACE) 250 MG capsule Take 250 mg by mouth daily as needed for constipation.   Yes [provider]  lisinopril (PRINIVIL,ZESTRIL) 20 MG tablet Take 20 mg by mouth daily.    Yes [provider]  metFORMIN (GLUCOPHAGE) 500 MG tablet Take 500 mg by mouth 2 (two) times daily with a meal.   Yes [provider]  Multiple Vitamins-Minerals (CENTRUM SILVER ADULT 50+ PO) Take 1 tablet by mouth daily.    Yes [provider]  Multiple Vitamins-Minerals (PRESERVISION AREDS 2) CAPS Take 1 capsule by mouth daily.    Yes [provider]  Ustekinumab (STELARA) 45 MG/0.5ML SOLN Inject 45 mg into the skin every 3 (three) months.    Yes [provider]  HYDROcodone-acetaminophen (NORCO) 5-325 MG tablet Take 1 tablet by mouth every 6 (six) hours as needed for severe pain. 11/20/18   Julianne Rice, MD  ondansetron (ZOFRAN) 4 MG tablet Take 1 tablet (4 mg total) by mouth every 6 (six) hours as needed for nausea or vomiting. 11/20/18   Julianne Rice, MD    Family History History reviewed. No pertinent family history.  Social History Social History   Tobacco Use  . Smoking status: Never Smoker  . Smokeless tobacco: Never Used  Substance Use Topics  . Alcohol use: No  . Drug use: No     Allergies   Patient has no known allergies.   Review of Systems Review of Systems  Constitutional: Negative for chills and fever.  Eyes: Negative for visual disturbance.  Respiratory: Negative for cough and shortness of breath.   Cardiovascular: Negative for chest pain.  Gastrointestinal: Positive for abdominal pain, nausea and vomiting. Negative for constipation and diarrhea.  Genitourinary: Positive for flank pain and hematuria. Negative for difficulty urinating, dysuria and  frequency.  Musculoskeletal: Positive for back pain and myalgias. Negative for neck pain.  Skin: Positive for rash. Negative for wound.  Neurological: Negative for dizziness, weakness, light-headedness, numbness and headaches.  All other systems reviewed and are negative.    Physical Exam Updated Vital Signs BP (!) 173/93   Pulse 70   Temp (!) 97.3 F (36.3 C) (Oral)   Resp 20   Ht 5\' 11"  (1.803 m)   Wt 81.6 kg   SpO2 93%   BMI 25.10 kg/m   Physical Exam Vitals signs and nursing note reviewed.  Constitutional:      General: She is not in acute distress.    Appearance: Normal appearance. She  is well-developed. She is not ill-appearing.  HENT:     Head: Normocephalic and atraumatic.     Mouth/Throat:     Mouth: Mucous membranes are moist.  Eyes:     Pupils: Pupils are equal, round, and reactive to light.  Neck:     Musculoskeletal: Normal range of motion and neck supple.  Cardiovascular:     Rate and Rhythm: Normal rate and regular rhythm.     Heart sounds: No murmur. No friction rub. No gallop.   Pulmonary:     Effort: Pulmonary effort is normal. No respiratory distress.     Breath sounds: Normal breath sounds. No wheezing or rales.  Abdominal:     General: Bowel sounds are normal. There is distension.     Palpations: Abdomen is soft.     Tenderness: There is abdominal tenderness.     Comments: Diffuse abdominal tenderness to palpation but appears to be most concentrated in the right upper and right lower quadrants.  There is no rebound or guarding.  Patient does have scaly rash maculopapular over the lateral side of her abdomen.  Musculoskeletal: Normal range of motion.        General: Tenderness present. No swelling or deformity.     Comments: Right CVA tenderness to palpation.  No midline thoracic or lumbar tenderness.  No lower extremity swelling, asymmetry or tenderness.  Skin:    General: Skin is warm and dry.     Findings: No erythema or rash.  Neurological:     General: No focal deficit present.     Mental Status: She is alert and oriented to person, place, and time.     Comments: 5/5 motor in all extremities.  Sensation fully intact.  Psychiatric:        Behavior: Behavior normal.      ED Treatments / Results  Labs (all labs ordered are listed, but only abnormal results are displayed) Labs Reviewed  COMPREHENSIVE METABOLIC PANEL - Abnormal; Notable for the following components:      Result Value   Glucose, Bld 144 (*)    All other components within normal limits  URINALYSIS, ROUTINE W REFLEX MICROSCOPIC - Abnormal; Notable for the following  components:   Color, Urine STRAW (*)    Specific Gravity, Urine 1.041 (*)    Glucose, UA 50 (*)    Hgb urine dipstick SMALL (*)    Ketones, ur 20 (*)    Bacteria, UA RARE (*)    All other components within normal limits  CBC WITH DIFFERENTIAL/PLATELET  LIPASE, BLOOD    EKG None  Radiology Ct Abdomen Pelvis W Contrast  Result Date: 11/20/2018 CLINICAL DATA:  Acute abdominal pain. Vomiting. EXAM: CT ABDOMEN AND PELVIS WITH CONTRAST TECHNIQUE: Multidetector CT imaging of the abdomen and pelvis was performed using  the standard protocol following bolus administration of intravenous contrast. CONTRAST:  179mL OMNIPAQUE IOHEXOL 300 MG/ML  SOLN COMPARISON:  None. FINDINGS: Lower chest: No pleural fluid or consolidation. Patulous distal esophagus without wall thickening. Hepatobiliary: No focal liver abnormality is seen. No gallstones, gallbladder wall thickening, or biliary dilatation. Pancreas: No ductal dilatation or inflammation. Spleen: Normal in size. Tiny subcentimeter hypodensity in the mid spleen is too small to characterize but likely small cyst. Adrenals/Urinary Tract: Normal adrenal glands. Mild right hydroureteronephrosis punctate obstructing stone at the right ureterovesicular junction, image 88 series 3 and image 130 series 6. Mild right perinephric edema. Absent excretion on delayed phase imaging. No left hydronephrosis. No evidence of additional nonobstructing renal stones. Urinary bladder is near completely empty. No bladder wall thickening. Stomach/Bowel: Significant tortuosity of the transverse colon with gaseous distension. Moderate stool in the ascending and descending colon. Mobile cecum located in the pelvis to the left of midline. Appendix courses from the left pelvis into the central abdomen, for example image 79 series 3, and is normal. No small bowel dilatation, inflammation, or obstruction. The stomach is nondistended. Vascular/Lymphatic: Normal caliber abdominal aorta with trace  atherosclerosis. No aneurysm. Few prominent periportal and retroperitoneal nodes which are likely reactive. Portal vein and mesenteric vessels are patent. Reproductive: Uterus and bilateral adnexa are unremarkable. Other: No free air, free fluid, or intra-abdominal fluid collection. Musculoskeletal: Bones are under mineralized with mild degenerative change in the spine. There are no acute or suspicious osseous abnormalities. IMPRESSION: 1. Punctate obstructing stone at the right ureterovesicular junction with mild hydronephrosis. 2. Mesenteric laxity with mobile cecum located in the left lower quadrant and significant transverse colonic tortuosity. No acute bowel inflammation or obstruction. Electronically Signed   By: Keith Rake M.D.   On: 11/20/2018 19:10    Procedures Procedures (including critical care time)  Medications Ordered in ED Medications  sodium chloride 0.9 % bolus 500 mL (has no administration in time range)  ondansetron (ZOFRAN) injection 4 mg (4 mg Intravenous Given 11/20/18 1715)  morphine 2 MG/ML injection 2 mg (2 mg Intravenous Given 11/20/18 1716)  iohexol (OMNIPAQUE) 300 MG/ML solution 100 mL (100 mLs Intravenous Contrast Given 11/20/18 1823)     Initial Impression / Assessment and Plan / ED Course  I have reviewed the triage vital signs and the nursing notes.  Pertinent labs & imaging results that were available during my care of the patient were reviewed by me and considered in my medical decision making (see chart for details).      Patient's pain is completely resolved.  CT demonstrate small right-sided UVJ stone.  No evidence of secondary infection.    Final Clinical Impressions(s) / ED Diagnoses   Final diagnoses:  Right renal stone    ED Discharge Orders         Ordered    HYDROcodone-acetaminophen (NORCO) 5-325 MG tablet  Every 6 hours PRN     11/20/18 2055    ondansetron (ZOFRAN) 4 MG tablet  Every 6 hours PRN     11/20/18 2055            Julianne Rice, MD 11/20/18 2059

## 2018-11-20 NOTE — ED Triage Notes (Signed)
Pain started this morning with right sided flank pain. Followed by 4x occurences of emesis. Took lisinopril this am.

## 2019-01-24 ENCOUNTER — Ambulatory Visit
Admission: RE | Admit: 2019-01-24 | Discharge: 2019-01-24 | Disposition: A | Discharge status: Home / Self Care | Payer: Medicare Other | Source: Ambulatory Visit | Attending: Family Medicine | Admitting: Family Medicine

## 2019-01-24 ENCOUNTER — Other Ambulatory Visit: Payer: Self-pay

## 2019-01-24 DIAGNOSIS — Z1231 Encounter for screening mammogram for malignant neoplasm of breast: Secondary | ICD-10-CM

## 2019-01-27 DIAGNOSIS — L84 Corns and callosities: Secondary | ICD-10-CM | POA: Diagnosis not present

## 2019-01-27 DIAGNOSIS — E1151 Type 2 diabetes mellitus with diabetic peripheral angiopathy without gangrene: Secondary | ICD-10-CM | POA: Diagnosis not present

## 2019-01-27 DIAGNOSIS — L602 Onychogryphosis: Secondary | ICD-10-CM | POA: Diagnosis not present

## 2019-02-16 DIAGNOSIS — Z79899 Other long term (current) drug therapy: Secondary | ICD-10-CM | POA: Diagnosis not present

## 2019-02-16 DIAGNOSIS — L400 Psoriasis vulgaris: Secondary | ICD-10-CM | POA: Diagnosis not present

## 2019-03-09 DIAGNOSIS — E119 Type 2 diabetes mellitus without complications: Secondary | ICD-10-CM | POA: Diagnosis not present

## 2019-03-09 DIAGNOSIS — L409 Psoriasis, unspecified: Secondary | ICD-10-CM | POA: Diagnosis not present

## 2019-03-09 DIAGNOSIS — R4189 Other symptoms and signs involving cognitive functions and awareness: Secondary | ICD-10-CM | POA: Diagnosis not present

## 2019-03-09 DIAGNOSIS — I8310 Varicose veins of unspecified lower extremity with inflammation: Secondary | ICD-10-CM | POA: Diagnosis not present

## 2019-03-09 DIAGNOSIS — E559 Vitamin D deficiency, unspecified: Secondary | ICD-10-CM | POA: Diagnosis not present

## 2019-03-09 DIAGNOSIS — B353 Tinea pedis: Secondary | ICD-10-CM | POA: Diagnosis not present

## 2019-03-09 DIAGNOSIS — I10 Essential (primary) hypertension: Secondary | ICD-10-CM | POA: Diagnosis not present

## 2019-03-09 DIAGNOSIS — M85852 Other specified disorders of bone density and structure, left thigh: Secondary | ICD-10-CM | POA: Diagnosis not present

## 2019-03-09 DIAGNOSIS — Z79899 Other long term (current) drug therapy: Secondary | ICD-10-CM | POA: Diagnosis not present

## 2019-03-09 DIAGNOSIS — E785 Hyperlipidemia, unspecified: Secondary | ICD-10-CM | POA: Diagnosis not present

## 2019-03-09 DIAGNOSIS — E1142 Type 2 diabetes mellitus with diabetic polyneuropathy: Secondary | ICD-10-CM | POA: Diagnosis not present

## 2019-03-09 DIAGNOSIS — I480 Paroxysmal atrial fibrillation: Secondary | ICD-10-CM | POA: Diagnosis not present

## 2019-03-24 DIAGNOSIS — E1142 Type 2 diabetes mellitus with diabetic polyneuropathy: Secondary | ICD-10-CM | POA: Diagnosis not present

## 2019-03-24 DIAGNOSIS — Z0000 Encounter for general adult medical examination without abnormal findings: Secondary | ICD-10-CM | POA: Diagnosis not present

## 2019-03-24 DIAGNOSIS — E1159 Type 2 diabetes mellitus with other circulatory complications: Secondary | ICD-10-CM | POA: Diagnosis not present

## 2019-03-24 DIAGNOSIS — I10 Essential (primary) hypertension: Secondary | ICD-10-CM | POA: Diagnosis not present

## 2019-03-24 DIAGNOSIS — Z1389 Encounter for screening for other disorder: Secondary | ICD-10-CM | POA: Diagnosis not present

## 2019-03-24 DIAGNOSIS — L409 Psoriasis, unspecified: Secondary | ICD-10-CM | POA: Diagnosis not present

## 2019-03-24 DIAGNOSIS — R4189 Other symptoms and signs involving cognitive functions and awareness: Secondary | ICD-10-CM | POA: Diagnosis not present

## 2019-03-24 DIAGNOSIS — R21 Rash and other nonspecific skin eruption: Secondary | ICD-10-CM | POA: Diagnosis not present

## 2019-03-24 DIAGNOSIS — N950 Postmenopausal bleeding: Secondary | ICD-10-CM | POA: Diagnosis not present

## 2019-03-24 DIAGNOSIS — I480 Paroxysmal atrial fibrillation: Secondary | ICD-10-CM | POA: Diagnosis not present

## 2019-03-24 DIAGNOSIS — H60541 Acute eczematoid otitis externa, right ear: Secondary | ICD-10-CM | POA: Diagnosis not present

## 2019-03-24 DIAGNOSIS — G629 Polyneuropathy, unspecified: Secondary | ICD-10-CM | POA: Diagnosis not present

## 2019-03-25 ENCOUNTER — Other Ambulatory Visit: Payer: Self-pay | Admitting: Family Medicine

## 2019-03-25 DIAGNOSIS — M85852 Other specified disorders of bone density and structure, left thigh: Secondary | ICD-10-CM

## 2019-04-07 ENCOUNTER — Ambulatory Visit (INDEPENDENT_AMBULATORY_CARE_PROVIDER_SITE_OTHER): Payer: Medicare Other | Admitting: Podiatry

## 2019-04-07 ENCOUNTER — Other Ambulatory Visit: Payer: Self-pay

## 2019-04-07 ENCOUNTER — Encounter: Payer: Self-pay | Admitting: Podiatry

## 2019-04-07 VITALS — BP 156/81 | HR 67 | Temp 98.4°F

## 2019-04-07 DIAGNOSIS — M79674 Pain in right toe(s): Secondary | ICD-10-CM

## 2019-04-07 DIAGNOSIS — M79675 Pain in left toe(s): Secondary | ICD-10-CM | POA: Diagnosis not present

## 2019-04-07 DIAGNOSIS — E119 Type 2 diabetes mellitus without complications: Secondary | ICD-10-CM | POA: Diagnosis not present

## 2019-04-07 DIAGNOSIS — B351 Tinea unguium: Secondary | ICD-10-CM

## 2019-04-07 NOTE — Patient Instructions (Signed)
Diabetes Mellitus and Foot Care Foot care is an important part of your health, especially when you have diabetes. Diabetes may cause you to have problems because of poor blood flow (circulation) to your feet and legs, which can cause your skin to:  Become thinner and drier.  Break more easily.  Heal more slowly.  Peel and crack. You may also have nerve damage (neuropathy) in your legs and feet, causing decreased feeling in them. This means that you may not notice minor injuries to your feet that could lead to more serious problems. Noticing and addressing any potential problems early is the best way to prevent future foot problems. How to care for your feet Foot hygiene  Wash your feet daily with warm water and mild soap. Do not use hot water. Then, pat your feet and the areas between your toes until they are completely dry. Do not soak your feet as this can dry your skin.  Trim your toenails straight across. Do not dig under them or around the cuticle. File the edges of your nails with an emery board or nail file.  Apply a moisturizing lotion or petroleum jelly to the skin on your feet and to dry, brittle toenails. Use lotion that does not contain alcohol and is unscented. Do not apply lotion between your toes. Shoes and socks  Wear clean socks or stockings every day. Make sure they are not too tight. Do not wear knee-high stockings since they may decrease blood flow to your legs.  Wear shoes that fit properly and have enough cushioning. Always look in your shoes before you put them on to be sure there are no objects inside.  To break in new shoes, wear them for just a few hours a day. This prevents injuries on your feet. Wounds, scrapes, corns, and calluses  Check your feet daily for blisters, cuts, bruises, sores, and redness. If you cannot see the bottom of your feet, use a mirror or ask someone for help.  Do not cut corns or calluses or try to remove them with medicine.  If you  find a minor scrape, cut, or break in the skin on your feet, keep it and the skin around it clean and dry. You may clean these areas with mild soap and water. Do not clean the area with peroxide, alcohol, or iodine.  If you have a wound, scrape, corn, or callus on your foot, look at it several times a day to make sure it is healing and not infected. Check for: ? Redness, swelling, or pain. ? Fluid or blood. ? Warmth. ? Pus or a bad smell. General instructions  Do not cross your legs. This may decrease blood flow to your feet.  Do not use heating pads or hot water bottles on your feet. They may burn your skin. If you have lost feeling in your feet or legs, you may not know this is happening until it is too late.  Protect your feet from hot and cold by wearing shoes, such as at the beach or on hot pavement.  Schedule a complete foot exam at least once a year (annually) or more often if you have foot problems. If you have foot problems, report any cuts, sores, or bruises to your health care provider immediately. Contact a health care provider if:  You have a medical condition that increases your risk of infection and you have any cuts, sores, or bruises on your feet.  You have an injury that is not   healing.  You have redness on your legs or feet.  You feel burning or tingling in your legs or feet.  You have pain or cramps in your legs and feet.  Your legs or feet are numb.  Your feet always feel cold.  You have pain around a toenail. Get help right away if:  You have a wound, scrape, corn, or callus on your foot and: ? You have pain, swelling, or redness that gets worse. ? You have fluid or blood coming from the wound, scrape, corn, or callus. ? Your wound, scrape, corn, or callus feels warm to the touch. ? You have pus or a bad smell coming from the wound, scrape, corn, or callus. ? You have a fever. ? You have a red line going up your leg. Summary  Check your feet every day  for cuts, sores, red spots, swelling, and blisters.  Moisturize feet and legs daily.  Wear shoes that fit properly and have enough cushioning.  If you have foot problems, report any cuts, sores, or bruises to your health care provider immediately.  Schedule a complete foot exam at least once a year (annually) or more often if you have foot problems. This information is not intended to replace advice given to you by your health care provider. Make sure you discuss any questions you have with your health care provider. Document Released: 07/27/2000 Document Revised: 09/11/2017 Document Reviewed: 08/31/2016 Elsevier Patient Education  2020 [employer]   Onychomycosis/Fungal Toenails  WHAT IS IT? An infection that lies within the keratin of your nail plate that is caused by a fungus.  WHY ME? Fungal infections affect all ages, sexes, races, and creeds.  There may be many factors that predispose you to a fungal infection such as age, coexisting medical conditions such as diabetes, or an autoimmune disease; stress, medications, fatigue, genetics, etc.  Bottom line: fungus thrives in a warm, moist environment and your shoes offer such a location.  IS IT CONTAGIOUS? Theoretically, yes.  You do not want to share shoes, nail clippers or files with someone who has fungal toenails.  Walking around barefoot in the same room or sleeping in the same bed is unlikely to transfer the organism.  It is important to realize, however, that fungus can spread easily from one nail to the next on the same foot.  HOW DO WE TREAT THIS?  There are several ways to treat this condition.  Treatment may depend on many factors such as age, medications, pregnancy, liver and kidney conditions, etc.  It is best to ask your doctor which options are available to you.  1. No treatment.   Unlike many other medical concerns, you can live with this condition.  However for many people this can be a painful condition and may lead to  ingrown toenails or a bacterial infection.  It is recommended that you keep the nails cut short to help reduce the amount of fungal nail. 2. Topical treatment.  These range from herbal remedies to prescription strength nail lacquers.  About 40-50% effective, topicals require twice daily application for approximately 9 to 12 months or until an entirely new nail has grown out.  The most effective topicals are medical grade medications available through physicians offices. 3. Oral antifungal medications.  With an 80-90% cure rate, the most common oral medication requires 3 to 4 months of therapy and stays in your system for a year as the new nail grows out.  Oral antifungal medications do require   blood work to make sure it is a safe drug for you.  A liver function panel will be performed prior to starting the medication and after the first month of treatment.  It is important to have the blood work performed to avoid any harmful side effects.  In general, this medication safe but blood work is required. 4. Laser Therapy.  This treatment is performed by applying a specialized laser to the affected nail plate.  This therapy is noninvasive, fast, and non-painful.  It is not covered by insurance and is therefore, out of pocket.  The results have been very good with a 80-95% cure rate.  The [facility] is the only practice in the area to offer this therapy. 5. Permanent Nail Avulsion.  Removing the entire nail so that a new nail will not grow back. 

## 2019-04-13 DIAGNOSIS — Z23 Encounter for immunization: Secondary | ICD-10-CM | POA: Diagnosis not present

## 2019-04-13 DIAGNOSIS — R21 Rash and other nonspecific skin eruption: Secondary | ICD-10-CM | POA: Diagnosis not present

## 2019-04-15 NOTE — Progress Notes (Signed)
Subjective: Melissa Bishop presents today referred by [name], MD for diabetic foot evaluation.  Patient relates 20 year history of diabetes.  Patient denies any history of foot wounds.  Patient denies any history of numbness, tingling, burning, pins/needles sensations.  Today, patient c/o of painful, discolored, thick toenails which interfere with daily activities.  Pain is aggravated when wearing enclosed shoe gear.   Past Medical History:  Diagnosis Date  . Diabetes mellitus without complication ([place])   . High cholesterol   . Hypertension     There are no active problems to display for this patient.   Past Surgical History:  Procedure Laterality Date  . BREAST EXCISIONAL BIOPSY Right ? more than 30 years  . BREAST SURGERY       Current Outpatient Medications:  .  apixaban (ELIQUIS) 5 MG TABS tablet, Take 5 mg by mouth 2 (two) times daily., Disp: , Rfl:  .  atorvastatin (LIPITOR) 40 MG tablet, Take 40 mg by mouth daily., Disp: , Rfl:  .  docusate sodium (COLACE) 250 MG capsule, Take 250 mg by mouth daily as needed for constipation., Disp: , Rfl:  .  HYDROcodone-acetaminophen (NORCO) 5-325 MG tablet, Take 1 tablet by mouth every 6 (six) hours as needed for severe pain., Disp: 10 tablet, Rfl: 0 .  lisinopril (PRINIVIL,ZESTRIL) 20 MG tablet, Take 20 mg by mouth daily. , Disp: , Rfl:  .  metFORMIN (GLUCOPHAGE) 500 MG tablet, Take 500 mg by mouth 2 (two) times daily with a meal., Disp: , Rfl:  .  Multiple Vitamins-Minerals (CENTRUM SILVER ADULT 50+ PO), Take 1 tablet by mouth daily., Disp: , Rfl:  .  Multiple Vitamins-Minerals (PRESERVISION AREDS 2) CAPS, Take 1 capsule by mouth daily. , Disp: , Rfl:  .  ondansetron (ZOFRAN) 4 MG tablet, Take 1 tablet (4 mg total) by mouth every 6 (six) hours as needed for nausea or vomiting., Disp: 12 tablet, Rfl: 0 .  Ustekinumab (STELARA) 45 MG/0.5ML SOLN, Inject 45 mg into the skin every 3 (three) months. , Disp: , Rfl:   No Known  Allergies  Social History   Occupational History  . Not on file  Tobacco Use  . Smoking status: Never Smoker  . Smokeless tobacco: Never Used  Substance and Sexual Activity  . Alcohol use: No  . Drug use: No  . Sexual activity: Not on file    History reviewed. No pertinent family history.   There is no immunization history on file for this patient.  Review of systems: Positive Findings in bold print.  Constitutional:  chills, fatigue, fever, sweats, weight change Communication: Optometrist, sign Ecologist, hand writing, iPad/Android device Head: headaches, head injury Eyes: changes in vision, eye pain, glaucoma, cataracts, macular degeneration, diplopia, glare,  light sensitivity, eyeglasses or contacts, blindness Ears nose mouth throat: hearing impaired, hearing aids,  ringing in ears, deaf, sign language,  vertigo, nosebleeds,  rhinitis,  cold sores, snoring, swollen glands Cardiovascular: HTN, edema, arrhythmia, pacemaker in place, defibrillator in place, chest pain/tightness, chronic anticoagulation, blood clot, heart failure, MI Peripheral Vascular: leg cramps, varicose veins, blood clots, lymphedema, varicosities Respiratory:  difficulty breathing, denies congestion, SOB, wheezing, cough, emphysema Gastrointestinal: change in appetite or weight, abdominal pain, constipation, diarrhea, nausea, vomiting, vomiting blood, change in bowel habits, abdominal pain, jaundice, rectal bleeding, hemorrhoids, GERD Genitourinary:  nocturia,  pain on urination, polyuria,  blood in urine, Foley catheter, urinary urgency, ESRD on hemodialysis Musculoskeletal: amputation, cramping, stiff joints, painful joints, decreased joint motion, fractures, OA, gout,  hemiplegia, paraplegia, uses cane, wheelchair bound, uses walker, uses rollator Skin: +changes in toenails, color change, dryness, itching, mole changes,  rash, wound(s) Neurological: headaches, numbness in feet, paresthesias in feet,  burning in feet, fainting,  seizures, change in speech,  headaches, memory problems/poor historian, cerebral palsy, weakness, paralysis, CVA, TIA Endocrine: diabetes, hypothyroidism, hyperthyroidism,  goiter, dry mouth, flushing, heat intolerance,  cold intolerance,  excessive thirst, denies polyuria,  nocturia Hematological:  easy bleeding, excessive bleeding, easy bruising, enlarged lymph nodes, on long term blood thinner, history of past transusions Allergy/immunological:  hives, eczema, frequent infections, multiple drug allergies, seasonal allergies, transplant recipient, multiple food allergies Psychiatric:  anxiety, depression, mood disorder, suicidal ideations, hallucinations, insomnia  Objective: Vitals:   04/07/19 1023  BP: (!) 156/81  Pulse: 67  Temp: 98.4 F (36.9 C)   Vascular Examination: Capillary refill time less than 3 seconds x 10 digits.  Dorsalis pedis pulses 2/4 b/l.  Posterior tibial pulses 2/4 b/l.  Digital hair sparse x 10 digits.  Skin temperature gradient WNL b/l.  Dermatological Examination: Skin with normal turgor, texture and tone b/l.  Toenails 1-5 b/l discolored, thick, dystrophic with subungual debris and pain with palpation to nailbeds due to thickness of nails.  Musculoskeletal: Muscle strength 5/5 to all LE muscle groups.  Mild hammertoe deformity b/l lesser digits.  No pain, crepitus with active/passive ROM b/l LE.  Neurological: Sensation intact 5/5 b/l with 10 gram monofilament.  Vibratory sensation intact b/l.  Assessment: 1. Painful onychomycosis toenails 1-5 b/l  2. NIDDM  Plan: 1. Discussed diabetic foot care principles. Literature dispensed on today. 2. Toenails 1-5 b/l were debrided in length and girth without iatrogenic bleeding.  Avoid self trimming due to use of blood thinner. 3. Patient to continue soft, supportive shoe gear 4. Patient to report any pedal injuries to medical professional immediately. 5. Follow up 3  months.  6. Patient/POA to call should there be a concern in the interim.

## 2019-05-06 ENCOUNTER — Other Ambulatory Visit: Payer: Self-pay

## 2019-05-06 ENCOUNTER — Ambulatory Visit
Admission: RE | Admit: 2019-05-06 | Discharge: 2019-05-06 | Disposition: A | Discharge status: Home / Self Care | Payer: Medicare Other | Source: Ambulatory Visit | Attending: Family Medicine | Admitting: Family Medicine

## 2019-05-06 DIAGNOSIS — M810 Age-related osteoporosis without current pathological fracture: Secondary | ICD-10-CM | POA: Diagnosis not present

## 2019-05-06 DIAGNOSIS — Z780 Asymptomatic menopausal state: Secondary | ICD-10-CM | POA: Diagnosis not present

## 2019-05-06 DIAGNOSIS — M85852 Other specified disorders of bone density and structure, left thigh: Secondary | ICD-10-CM

## 2019-05-06 DIAGNOSIS — M8588 Other specified disorders of bone density and structure, other site: Secondary | ICD-10-CM | POA: Diagnosis not present

## 2019-05-18 DIAGNOSIS — L400 Psoriasis vulgaris: Secondary | ICD-10-CM | POA: Diagnosis not present

## 2019-05-18 DIAGNOSIS — L738 Other specified follicular disorders: Secondary | ICD-10-CM | POA: Diagnosis not present

## 2019-05-18 DIAGNOSIS — Z79899 Other long term (current) drug therapy: Secondary | ICD-10-CM | POA: Diagnosis not present

## 2019-06-12 DIAGNOSIS — M810 Age-related osteoporosis without current pathological fracture: Secondary | ICD-10-CM | POA: Diagnosis not present

## 2019-07-07 ENCOUNTER — Ambulatory Visit: Payer: Medicare Other | Admitting: Podiatry

## 2019-07-20 ENCOUNTER — Ambulatory Visit (INDEPENDENT_AMBULATORY_CARE_PROVIDER_SITE_OTHER): Payer: Medicare Other | Admitting: Podiatry

## 2019-07-20 ENCOUNTER — Encounter: Payer: Self-pay | Admitting: Podiatry

## 2019-07-20 ENCOUNTER — Other Ambulatory Visit: Payer: Self-pay

## 2019-07-20 DIAGNOSIS — L84 Corns and callosities: Secondary | ICD-10-CM

## 2019-07-20 DIAGNOSIS — B351 Tinea unguium: Secondary | ICD-10-CM

## 2019-07-20 DIAGNOSIS — E119 Type 2 diabetes mellitus without complications: Secondary | ICD-10-CM | POA: Diagnosis not present

## 2019-07-20 DIAGNOSIS — M79674 Pain in right toe(s): Secondary | ICD-10-CM | POA: Diagnosis not present

## 2019-07-20 DIAGNOSIS — M79675 Pain in left toe(s): Secondary | ICD-10-CM | POA: Diagnosis not present

## 2019-07-26 NOTE — Progress Notes (Signed)
Subjective: Melissa Bishop is a 73 y.o. y.o. female who presents today with cc of painful, discolored, thick toenails and calluses b/l which interfere with daily activities. Pain is aggravated when wearing enclosed shoe gear and relieved with periodic professional debridement.  [name], MD is her PCP.   Medications reviewed in chart.  No Known Allergies  Objective: There were no vitals filed for this visit.  Vascular Examination: Capillary refill time to digits <3 seconds b/l.  Dorsalis pedis and posterior tibial pulses palpable b/l.  Digital hair sparse b/l.  Skin temperature gradient WNL b/l.  Dermatological Examination: Skin with normal turgor, texture and tone b/l.  Toenails 1-5 b/l discolored, thick, dystrophic with subungual debris and pain with palpation to nailbeds due to thickness of nails.  Hyperkeratotic lesion b/l hallux with tenderness to palpation. No edema, no erythema, no drainage, no flocculence.  Musculoskeletal: Muscle strength 5/5 to all LE muscle groups b/l.  Hammertoe deformity lesser digits b/l.   Neurological: Sensation intact 5/5 with 10 gram monofilament b/l.  Vibratory sensation intact b/l.  Assessment: 1. Painful onychomycosis toenails 1-5 b/l 2.  Calluses b/l hallux 3.  NIDDM   Plan: 1. Continue diabetic foot care principles. Literature dispensed on today. 2. Toenails 1-5 b/l were debrided in length and girth without iatrogenic bleeding. 3. Calluses pared b/l hallux utilizing sterile scalpel blade without incident.  4. Patient to continue soft, supportive shoe gear daily. 5. Patient to report any pedal injuries to medical professional immediately. 6. Follow up 3 months.  7. Patient/POA to call should there be a concern in the interim.

## 2019-08-20 DIAGNOSIS — H35372 Puckering of macula, left eye: Secondary | ICD-10-CM | POA: Diagnosis not present

## 2019-08-20 DIAGNOSIS — Z83518 Family history of other specified eye disorder: Secondary | ICD-10-CM | POA: Diagnosis not present

## 2019-08-20 DIAGNOSIS — H5315 Visual distortions of shape and size: Secondary | ICD-10-CM | POA: Diagnosis not present

## 2019-08-20 DIAGNOSIS — E119 Type 2 diabetes mellitus without complications: Secondary | ICD-10-CM | POA: Diagnosis not present

## 2019-08-20 DIAGNOSIS — Z961 Presence of intraocular lens: Secondary | ICD-10-CM | POA: Diagnosis not present

## 2019-09-04 ENCOUNTER — Ambulatory Visit: Payer: Medicare Other | Attending: Internal Medicine

## 2019-09-04 DIAGNOSIS — Z23 Encounter for immunization: Secondary | ICD-10-CM | POA: Insufficient documentation

## 2019-09-04 NOTE — Progress Notes (Signed)
   Covid-19 Vaccination Clinic  Name:  Hermine Depena    MRN: [id number] DOB: Aug 12, 1946  09/04/2019  Ms. Buttrum was observed post Covid-19 immunization for 15 minutes without incidence. She was provided with Vaccine Information Sheet and instruction to access the V-Safe system.   Ms. Grimsrud was instructed to call 911 with any severe reactions post vaccine: Marland Kitchen Difficulty breathing  . Swelling of your face and throat  . A fast heartbeat  . A bad rash all over your body  . Dizziness and weakness    Immunizations Administered    Name Date Dose VIS Date Route   Pfizer COVID-19 Vaccine 09/04/2019  6:01 PM 0.3 mL 07/24/2019 Intramuscular   Manufacturer: [place]   Lot: [id number]   [place]: [id number]

## 2019-09-25 ENCOUNTER — Ambulatory Visit: Payer: Medicare Other | Attending: Internal Medicine

## 2019-09-25 DIAGNOSIS — Z23 Encounter for immunization: Secondary | ICD-10-CM | POA: Insufficient documentation

## 2019-09-25 NOTE — Progress Notes (Signed)
   Covid-19 Vaccination Clinic  Name:  Melissa Bishop    MRN: [id number] DOB: 10-05-1945  09/25/2019  [name] was observed post Covid-19 immunization for 15 minutes without incidence. She was provided with Vaccine Information Sheet and instruction to access the V-Safe system.   [name] was instructed to call 911 with any severe reactions post vaccine: Marland Kitchen Difficulty breathing  . Swelling of your face and throat  . A fast heartbeat  . A bad rash all over your body  . Dizziness and weakness    Immunizations Administered    Name Date Dose VIS Date Route   Pfizer COVID-19 Vaccine 09/25/2019  4:24 PM 0.3 mL 07/24/2019 Intramuscular   Manufacturer: [place]   Lot: [id number]   [place]: [id number]

## 2019-09-29 DIAGNOSIS — I10 Essential (primary) hypertension: Secondary | ICD-10-CM | POA: Diagnosis not present

## 2019-09-29 DIAGNOSIS — E1142 Type 2 diabetes mellitus with diabetic polyneuropathy: Secondary | ICD-10-CM | POA: Diagnosis not present

## 2019-09-29 DIAGNOSIS — E785 Hyperlipidemia, unspecified: Secondary | ICD-10-CM | POA: Diagnosis not present

## 2019-09-29 DIAGNOSIS — Z79899 Other long term (current) drug therapy: Secondary | ICD-10-CM | POA: Diagnosis not present

## 2019-09-29 DIAGNOSIS — I480 Paroxysmal atrial fibrillation: Secondary | ICD-10-CM | POA: Diagnosis not present

## 2019-09-29 DIAGNOSIS — R4189 Other symptoms and signs involving cognitive functions and awareness: Secondary | ICD-10-CM | POA: Diagnosis not present

## 2019-09-29 DIAGNOSIS — M810 Age-related osteoporosis without current pathological fracture: Secondary | ICD-10-CM | POA: Diagnosis not present

## 2019-09-29 DIAGNOSIS — Z1159 Encounter for screening for other viral diseases: Secondary | ICD-10-CM | POA: Diagnosis not present

## 2019-09-29 DIAGNOSIS — G629 Polyneuropathy, unspecified: Secondary | ICD-10-CM | POA: Diagnosis not present

## 2019-09-29 DIAGNOSIS — Z7984 Long term (current) use of oral hypoglycemic drugs: Secondary | ICD-10-CM | POA: Diagnosis not present

## 2019-09-29 DIAGNOSIS — E559 Vitamin D deficiency, unspecified: Secondary | ICD-10-CM | POA: Diagnosis not present

## 2019-09-29 DIAGNOSIS — H9193 Unspecified hearing loss, bilateral: Secondary | ICD-10-CM | POA: Diagnosis not present

## 2019-09-29 DIAGNOSIS — E1159 Type 2 diabetes mellitus with other circulatory complications: Secondary | ICD-10-CM | POA: Diagnosis not present

## 2019-10-07 ENCOUNTER — Ambulatory Visit: Payer: Medicare Other | Admitting: Podiatry

## 2019-10-14 DIAGNOSIS — H903 Sensorineural hearing loss, bilateral: Secondary | ICD-10-CM | POA: Diagnosis not present

## 2019-10-19 ENCOUNTER — Encounter: Payer: Self-pay | Admitting: Podiatry

## 2019-10-19 ENCOUNTER — Ambulatory Visit (INDEPENDENT_AMBULATORY_CARE_PROVIDER_SITE_OTHER): Payer: Medicare Other | Admitting: Podiatry

## 2019-10-19 ENCOUNTER — Other Ambulatory Visit: Payer: Self-pay

## 2019-10-19 VITALS — Temp 97.3°F

## 2019-10-19 DIAGNOSIS — B351 Tinea unguium: Secondary | ICD-10-CM | POA: Diagnosis not present

## 2019-10-19 DIAGNOSIS — L84 Corns and callosities: Secondary | ICD-10-CM | POA: Diagnosis not present

## 2019-10-19 DIAGNOSIS — E119 Type 2 diabetes mellitus without complications: Secondary | ICD-10-CM

## 2019-10-19 DIAGNOSIS — M79675 Pain in left toe(s): Secondary | ICD-10-CM

## 2019-10-19 DIAGNOSIS — M79674 Pain in right toe(s): Secondary | ICD-10-CM | POA: Diagnosis not present

## 2019-10-19 NOTE — Patient Instructions (Signed)
Diabetes Mellitus and Foot Care Foot care is an important part of your health, especially when you have diabetes. Diabetes may cause you to have problems because of poor blood flow (circulation) to your feet and legs, which can cause your skin to:  Become thinner and drier.  Break more easily.  Heal more slowly.  Peel and crack. You may also have nerve damage (neuropathy) in your legs and feet, causing decreased feeling in them. This means that you may not notice minor injuries to your feet that could lead to more serious problems. Noticing and addressing any potential problems early is the best way to prevent future foot problems. How to care for your feet Foot hygiene  Wash your feet daily with warm water and mild soap. Do not use hot water. Then, pat your feet and the areas between your toes until they are completely dry. Do not soak your feet as this can dry your skin.  Trim your toenails straight across. Do not dig under them or around the cuticle. File the edges of your nails with an emery board or nail file.  Apply a moisturizing lotion or petroleum jelly to the skin on your feet and to dry, brittle toenails. Use lotion that does not contain alcohol and is unscented. Do not apply lotion between your toes. Shoes and socks  Wear clean socks or stockings every day. Make sure they are not too tight. Do not wear knee-high stockings since they may decrease blood flow to your legs.  Wear shoes that fit properly and have enough cushioning. Always look in your shoes before you put them on to be sure there are no objects inside.  To break in new shoes, wear them for just a few hours a day. This prevents injuries on your feet. Wounds, scrapes, corns, and calluses  Check your feet daily for blisters, cuts, bruises, sores, and redness. If you cannot see the bottom of your feet, use a mirror or ask someone for help.  Do not cut corns or calluses or try to remove them with medicine.  If you  find a minor scrape, cut, or break in the skin on your feet, keep it and the skin around it clean and dry. You may clean these areas with mild soap and water. Do not clean the area with peroxide, alcohol, or iodine.  If you have a wound, scrape, corn, or callus on your foot, look at it several times a day to make sure it is healing and not infected. Check for: ? Redness, swelling, or pain. ? Fluid or blood. ? Warmth. ? Pus or a bad smell. General instructions  Do not cross your legs. This may decrease blood flow to your feet.  Do not use heating pads or hot water bottles on your feet. They may burn your skin. If you have lost feeling in your feet or legs, you may not know this is happening until it is too late.  Protect your feet from hot and cold by wearing shoes, such as at the beach or on hot pavement.  Schedule a complete foot exam at least once a year (annually) or more often if you have foot problems. If you have foot problems, report any cuts, sores, or bruises to your health care provider immediately. Contact a health care provider if:  You have a medical condition that increases your risk of infection and you have any cuts, sores, or bruises on your feet.  You have an injury that is not   healing.  You have redness on your legs or feet.  You feel burning or tingling in your legs or feet.  You have pain or cramps in your legs and feet.  Your legs or feet are numb.  Your feet always feel cold.  You have pain around a toenail. Get help right away if:  You have a wound, scrape, corn, or callus on your foot and: ? You have pain, swelling, or redness that gets worse. ? You have fluid or blood coming from the wound, scrape, corn, or callus. ? Your wound, scrape, corn, or callus feels warm to the touch. ? You have pus or a bad smell coming from the wound, scrape, corn, or callus. ? You have a fever. ? You have a red line going up your leg. Summary  Check your feet every day  for cuts, sores, red spots, swelling, and blisters.  Moisturize feet and legs daily.  Wear shoes that fit properly and have enough cushioning.  If you have foot problems, report any cuts, sores, or bruises to your health care provider immediately.  Schedule a complete foot exam at least once a year (annually) or more often if you have foot problems. This information is not intended to replace advice given to you by your health care provider. Make sure you discuss any questions you have with your health care provider. Document Revised: 04/22/2019 Document Reviewed: 08/31/2016 Elsevier Patient Education  2020 Elsevier Inc.  Corns and Calluses Corns are small areas of thickened skin that occur on the top, sides, or tip of a toe. They contain a cone-shaped core with a point that can press on a nerve below. This causes pain.  Calluses are areas of thickened skin that can occur anywhere on the body, including the hands, fingers, palms, soles of the feet, and heels. Calluses are usually larger than corns. What are the causes? Corns and calluses are caused by rubbing (friction) or pressure, such as from shoes that are too tight or do not fit properly. What increases the risk? Corns are more likely to develop in people who have misshapen toes (toe deformities), such as hammer toes. Calluses can occur with friction to any area of the skin. They are more likely to develop in people who:  Work with their hands.  Wear shoes that fit poorly, are too tight, or are high-heeled.  Have toe deformities. What are the signs or symptoms? Symptoms of a corn or callus include:  A hard growth on the skin.  Pain or tenderness under the skin.  Redness and swelling.  Increased discomfort while wearing tight-fitting shoes, if your feet are affected. If a corn or callus becomes infected, symptoms may include:  Redness and swelling that gets worse.  Pain.  Fluid, blood, or pus draining from the corn or  callus. How is this diagnosed? Corns and calluses may be diagnosed based on your symptoms, your medical history, and a physical exam. How is this treated? Treatment for corns and calluses may include:  Removing the cause of the friction or pressure. This may involve: ? Changing your shoes. ? Wearing shoe inserts (orthotics) or other protective layers in your shoes, such as a corn pad. ? Wearing gloves.  Applying medicine to the skin (topical medicine) to help soften skin in the hardened, thickened areas.  Removing layers of dead skin with a file to reduce the size of the corn or callus.  Removing the corn or callus with a scalpel or laser.  Taking   antibiotic medicines, if your corn or callus is infected.  Having surgery, if a toe deformity is the cause. Follow these instructions at home:   Take over-the-counter and prescription medicines only as told by your health care provider.  If you were prescribed an antibiotic, take it as told by your health care provider. Do not stop taking it even if your condition starts to improve.  Wear shoes that fit well. Avoid wearing high-heeled shoes and shoes that are too tight or too loose.  Wear any padding, protective layers, gloves, or orthotics as told by your health care provider.  Soak your hands or feet and then use a file or pumice stone to soften your corn or callus. Do this as told by your health care provider.  Check your corn or callus every day for symptoms of infection. Contact a health care provider if you:  Notice that your symptoms do not improve with treatment.  Have redness or swelling that gets worse.  Notice that your corn or callus becomes painful.  Have fluid, blood, or pus coming from your corn or callus.  Have new symptoms. Summary  Corns are small areas of thickened skin that occur on the top, sides, or tip of a toe.  Calluses are areas of thickened skin that can occur anywhere on the body, including the  hands, fingers, palms, and soles of the feet. Calluses are usually larger than corns.  Corns and calluses are caused by rubbing (friction) or pressure, such as from shoes that are too tight or do not fit properly.  Treatment may include wearing any padding, protective layers, gloves, or orthotics as told by your health care provider. This information is not intended to replace advice given to you by your health care provider. Make sure you discuss any questions you have with your health care provider. Document Revised: 11/19/2018 Document Reviewed: 06/12/2017 Elsevier Patient Education  2020 Elsevier Inc.  

## 2019-10-22 NOTE — Progress Notes (Signed)
Subjective: Melissa Bishop presents today for follow up of preventative diabetic foot care and callus(es) b/l hallux and painful mycotic toenails b/l that are difficult to trim. Pain interferes with ambulation. Aggravating factors include wearing enclosed shoe gear. Pain is relieved with periodic professional debridement..   No Known Allergies   Objective: Vitals:   10/19/19 1340  Temp: (!) 97.3 F (36.3 C)    Pt 74 y.o. year old female  in NAD. AAO x 3.   Vascular Examination:  Capillary fill time to digits <3 seconds b/l. Palpable DP pulses b/l. Palpable PT pulses b/l. Pedal hair sparse b/l. Skin temperature gradient within normal limits b/l.  Dermatological Examination: Pedal skin with normal turgor, texture and tone bilaterally. No open wounds bilaterally. No interdigital macerations bilaterally. Toenails 1-5 b/l elongated, dystrophic, thickened, crumbly with subungual debris and tenderness to dorsal palpation. Hyperkeratotic lesion(s) b/l hallux IPJ.  No erythema, no edema, no drainage, no flocculence.  Musculoskeletal: Normal muscle strength 5/5 to all lower extremity muscle groups bilaterally, no pain crepitus or joint limitation noted with ROM b/l and hammertoes noted to the  2-5 bilaterally  Neurological: Protective sensation intact 5/5 intact bilaterally with 10g monofilament b/l Vibratory sensation intact b/l  Assessment: 1. Pain due to onychomycosis of toenails of both feet   2. Callus   3. Controlled type 2 diabetes mellitus without complication, without long-term current use of insulin ([place])    Plan: -Continue diabetic foot care principles. Literature dispensed on today.  -Toenails 1-5 b/l were debrided in length and girth with sterile nail nippers and dremel without iatrogenic bleeding.  -Calluses b/l hallux IPJs were debrided with sterile scalpel without complication or incident. Total number debrided =2. -Patient to continue soft, supportive shoe gear  daily. -Patient to report any pedal injuries to medical professional immediately. -Patient/POA to call should there be question/concern in the interim.  Return in about 3 months (around 01/19/2020) for diabetic nail and callus trim/ Eliquis.

## 2019-11-16 DIAGNOSIS — Z79899 Other long term (current) drug therapy: Secondary | ICD-10-CM | POA: Diagnosis not present

## 2019-11-16 DIAGNOSIS — L853 Xerosis cutis: Secondary | ICD-10-CM | POA: Diagnosis not present

## 2019-11-16 DIAGNOSIS — L400 Psoriasis vulgaris: Secondary | ICD-10-CM | POA: Diagnosis not present

## 2019-12-11 DIAGNOSIS — Z1379 Encounter for other screening for genetic and chromosomal anomalies: Secondary | ICD-10-CM | POA: Diagnosis not present

## 2019-12-11 DIAGNOSIS — Z8639 Personal history of other endocrine, nutritional and metabolic disease: Secondary | ICD-10-CM | POA: Diagnosis not present

## 2019-12-22 ENCOUNTER — Other Ambulatory Visit: Payer: Self-pay | Admitting: Family Medicine

## 2019-12-22 DIAGNOSIS — Z1231 Encounter for screening mammogram for malignant neoplasm of breast: Secondary | ICD-10-CM

## 2020-01-06 DIAGNOSIS — E785 Hyperlipidemia, unspecified: Secondary | ICD-10-CM | POA: Diagnosis not present

## 2020-01-13 ENCOUNTER — Encounter: Payer: Self-pay | Admitting: Podiatry

## 2020-01-13 ENCOUNTER — Ambulatory Visit (INDEPENDENT_AMBULATORY_CARE_PROVIDER_SITE_OTHER): Payer: Medicare Other | Admitting: Podiatry

## 2020-01-13 ENCOUNTER — Other Ambulatory Visit: Payer: Self-pay

## 2020-01-13 DIAGNOSIS — M79674 Pain in right toe(s): Secondary | ICD-10-CM | POA: Diagnosis not present

## 2020-01-13 DIAGNOSIS — L84 Corns and callosities: Secondary | ICD-10-CM

## 2020-01-13 DIAGNOSIS — E119 Type 2 diabetes mellitus without complications: Secondary | ICD-10-CM

## 2020-01-13 DIAGNOSIS — B351 Tinea unguium: Secondary | ICD-10-CM

## 2020-01-13 DIAGNOSIS — M79675 Pain in left toe(s): Secondary | ICD-10-CM

## 2020-01-13 NOTE — Patient Instructions (Addendum)
Apply Neosporin to left 2nd toe once daily for one week. Call office if you have any problems.     Diabetes Mellitus and Foot Care Foot care is an important part of your health, especially when you have diabetes. Diabetes may cause you to have problems because of poor blood flow (circulation) to your feet and legs, which can cause your skin to:  Become thinner and drier.  Break more easily.  Heal more slowly.  Peel and crack. You may also have nerve damage (neuropathy) in your legs and feet, causing decreased feeling in them. This means that you may not notice minor injuries to your feet that could lead to more serious problems. Noticing and addressing any potential problems early is the best way to prevent future foot problems. How to care for your feet Foot hygiene  Wash your feet daily with warm water and mild soap. Do not use hot water. Then, pat your feet and the areas between your toes until they are completely dry. Do not soak your feet as this can dry your skin.  Trim your toenails straight across. Do not dig under them or around the cuticle. File the edges of your nails with an emery board or nail file.  Apply a moisturizing lotion or petroleum jelly to the skin on your feet and to dry, brittle toenails. Use lotion that does not contain alcohol and is unscented. Do not apply lotion between your toes. Shoes and socks  Wear clean socks or stockings every day. Make sure they are not too tight. Do not wear knee-high stockings since they may decrease blood flow to your legs.  Wear shoes that fit properly and have enough cushioning. Always look in your shoes before you put them on to be sure there are no objects inside.  To break in new shoes, wear them for just a few hours a day. This prevents injuries on your feet. Wounds, scrapes, corns, and calluses  Check your feet daily for blisters, cuts, bruises, sores, and redness. If you cannot see the bottom of your feet, use a mirror  or ask someone for help.  Do not cut corns or calluses or try to remove them with medicine.  If you find a minor scrape, cut, or break in the skin on your feet, keep it and the skin around it clean and dry. You may clean these areas with mild soap and water. Do not clean the area with peroxide, alcohol, or iodine.  If you have a wound, scrape, corn, or callus on your foot, look at it several times a day to make sure it is healing and not infected. Check for: ? Redness, swelling, or pain. ? Fluid or blood. ? Warmth. ? Pus or a bad smell. General instructions  Do not cross your legs. This may decrease blood flow to your feet.  Do not use heating pads or hot water bottles on your feet. They may burn your skin. If you have lost feeling in your feet or legs, you may not know this is happening until it is too late.  Protect your feet from hot and cold by wearing shoes, such as at the beach or on hot pavement.  Schedule a complete foot exam at least once a year (annually) or more often if you have foot problems. If you have foot problems, report any cuts, sores, or bruises to your health care provider immediately. Contact a health care provider if:  You have a medical condition that increases your  risk of infection and you have any cuts, sores, or bruises on your feet.  You have an injury that is not healing.  You have redness on your legs or feet.  You feel burning or tingling in your legs or feet.  You have pain or cramps in your legs and feet.  Your legs or feet are numb.  Your feet always feel cold.  You have pain around a toenail. Get help right away if:  You have a wound, scrape, corn, or callus on your foot and: ? You have pain, swelling, or redness that gets worse. ? You have fluid or blood coming from the wound, scrape, corn, or callus. ? Your wound, scrape, corn, or callus feels warm to the touch. ? You have pus or a bad smell coming from the wound, scrape, corn, or  callus. ? You have a fever. ? You have a red line going up your leg. Summary  Check your feet every day for cuts, sores, red spots, swelling, and blisters.  Moisturize feet and legs daily.  Wear shoes that fit properly and have enough cushioning.  If you have foot problems, report any cuts, sores, or bruises to your health care provider immediately.  Schedule a complete foot exam at least once a year (annually) or more often if you have foot problems. This information is not intended to replace advice given to you by your health care provider. Make sure you discuss any questions you have with your health care provider. Document Revised: 04/22/2019 Document Reviewed: 08/31/2016 Elsevier Patient Education  [place].

## 2020-01-17 NOTE — Progress Notes (Signed)
Subjective: Melissa Bishop is a pleasant 74 y.o. female patient seen today preventative diabetic foot care and painful callus(es) b/l great toes and painful mycotic toenails b/l that are difficult to trim. Pain interferes with ambulation. Aggravating factors include wearing enclosed shoe gear. Pain is relieved with periodic professional debridement.  There are no problems to display for this patient.   Current Outpatient Medications on File Prior to Visit  Medication Sig Dispense Refill  . apixaban (ELIQUIS) 5 MG TABS tablet Take 5 mg by mouth 2 (two) times daily.    Marland Kitchen atorvastatin (LIPITOR) 40 MG tablet Take 40 mg by mouth daily.    Marland Kitchen docusate sodium (COLACE) 250 MG capsule Take 250 mg by mouth daily as needed for constipation.    . Fluocinolone Acetonide 0.01 % OIL     . HYDROcodone-acetaminophen (NORCO) 5-325 MG tablet Take 1 tablet by mouth every 6 (six) hours as needed for severe pain. 10 tablet 0  . lisinopril (PRINIVIL,ZESTRIL) 20 MG tablet Take 20 mg by mouth daily.     . metFORMIN (GLUCOPHAGE) 500 MG tablet Take 500 mg by mouth 2 (two) times daily with a meal.    . Multiple Vitamins-Minerals (CENTRUM SILVER ADULT 50+ PO) Take 1 tablet by mouth daily.    . Multiple Vitamins-Minerals (PRESERVISION AREDS 2) CAPS Take 1 capsule by mouth daily.     Marland Kitchen nystatin cream (MYCOSTATIN)     . ondansetron (ZOFRAN) 4 MG tablet Take 1 tablet (4 mg total) by mouth every 6 (six) hours as needed for nausea or vomiting. 12 tablet 0  . SHINGRIX injection     . tacrolimus (PROTOPIC) 0.1 % ointment     . Ustekinumab (STELARA) 45 MG/0.5ML SOLN Inject 45 mg into the skin every 3 (three) months.      No current facility-administered medications on file prior to visit.    No Known Allergies  Objective: Physical Exam  General: Melissa Bishop is a pleasant 74 y.o.  Caucasian female, in NAD. AAO x 3.   Vascular:  Neurovascular status unchanged b/l lower extremities. Capillary fill time to digits <3 seconds  b/l lower extremities. Palpable DP pulses b/l. Palpable PT pulses b/l. Pedal hair sparse b/l. Skin temperature gradient within normal limits b/l.  Dermatological:  Pedal skin with normal turgor, texture and tone bilaterally. No open wounds bilaterally. No interdigital macerations bilaterally. Toenails 1-5 b/l elongated, discolored, dystrophic, thickened, crumbly with subungual debris and tenderness to dorsal palpation. Hyperkeratotic lesion(s) L hallux and R hallux.  No erythema, no edema, no drainage, no flocculence.  Musculoskeletal:  Normal muscle strength 5/5 to all lower extremity muscle groups bilaterally. No pain crepitus or joint limitation noted with ROM b/l. No gross bony deformities bilaterally.  Neurological:  Protective sensation intact 5/5 intact bilaterally with 10g monofilament b/l. Vibratory sensation intact b/l.  Assessment and Plan:  1. Pain due to onychomycosis of toenails of both feet   2. Callus   3. Controlled type 2 diabetes mellitus without complication, without long-term current use of insulin ([facility])    -Examined patient. -Toenails 1-5 b/l were debrided in length and girth with sterile nail nippers and dremel without iatrogenic bleeding.  -Callus(es) L hallux and R hallux pared utilizing sterile scalpel blade without complication or incident. Total number debrided =2. -Patient to continue soft, supportive shoe gear daily. -Patient to report any pedal injuries to medical professional immediately. -Iatrogenic laceration sustained during L 2nd toe.  Treated with Lumicain Hemostatic Solution and alcohol. Patient instructed to apply Neosporin  to L 2nd toe once daily for 7 days. -Patient/POA to call should there be question/concern in the interim.  Return in about 3 months (around 04/14/2020) for diabetic nail and callus trim/ Eliquis.  Marzetta Board, DPM

## 2020-01-28 ENCOUNTER — Ambulatory Visit
Admission: RE | Admit: 2020-01-28 | Discharge: 2020-01-28 | Disposition: A | Discharge status: Home / Self Care | Payer: Medicare Other | Source: Ambulatory Visit | Attending: Family Medicine | Admitting: Family Medicine

## 2020-01-28 ENCOUNTER — Other Ambulatory Visit: Payer: Self-pay

## 2020-01-28 ENCOUNTER — Ambulatory Visit: Payer: Medicare Other

## 2020-01-28 DIAGNOSIS — Z1231 Encounter for screening mammogram for malignant neoplasm of breast: Secondary | ICD-10-CM

## 2020-02-03 ENCOUNTER — Other Ambulatory Visit: Payer: Self-pay | Admitting: Family Medicine

## 2020-02-03 DIAGNOSIS — R928 Other abnormal and inconclusive findings on diagnostic imaging of breast: Secondary | ICD-10-CM

## 2020-02-11 ENCOUNTER — Other Ambulatory Visit: Payer: Self-pay

## 2020-02-11 ENCOUNTER — Ambulatory Visit
Admission: RE | Admit: 2020-02-11 | Discharge: 2020-02-11 | Disposition: A | Discharge status: Home / Self Care | Payer: Medicare Other | Source: Ambulatory Visit | Attending: Family Medicine | Admitting: Family Medicine

## 2020-02-11 DIAGNOSIS — R928 Other abnormal and inconclusive findings on diagnostic imaging of breast: Secondary | ICD-10-CM

## 2020-02-16 DIAGNOSIS — E119 Type 2 diabetes mellitus without complications: Secondary | ICD-10-CM | POA: Diagnosis not present

## 2020-02-16 DIAGNOSIS — Z961 Presence of intraocular lens: Secondary | ICD-10-CM | POA: Diagnosis not present

## 2020-02-16 DIAGNOSIS — H35372 Puckering of macula, left eye: Secondary | ICD-10-CM | POA: Diagnosis not present

## 2020-02-16 DIAGNOSIS — H5315 Visual distortions of shape and size: Secondary | ICD-10-CM | POA: Diagnosis not present

## 2020-02-16 DIAGNOSIS — Z83518 Family history of other specified eye disorder: Secondary | ICD-10-CM | POA: Diagnosis not present

## 2020-03-17 DIAGNOSIS — Z79899 Other long term (current) drug therapy: Secondary | ICD-10-CM | POA: Diagnosis not present

## 2020-03-17 DIAGNOSIS — B353 Tinea pedis: Secondary | ICD-10-CM | POA: Diagnosis not present

## 2020-03-17 DIAGNOSIS — I10 Essential (primary) hypertension: Secondary | ICD-10-CM | POA: Diagnosis not present

## 2020-03-17 DIAGNOSIS — M810 Age-related osteoporosis without current pathological fracture: Secondary | ICD-10-CM | POA: Diagnosis not present

## 2020-03-17 DIAGNOSIS — R4189 Other symptoms and signs involving cognitive functions and awareness: Secondary | ICD-10-CM | POA: Diagnosis not present

## 2020-03-17 DIAGNOSIS — L409 Psoriasis, unspecified: Secondary | ICD-10-CM | POA: Diagnosis not present

## 2020-03-17 DIAGNOSIS — G629 Polyneuropathy, unspecified: Secondary | ICD-10-CM | POA: Diagnosis not present

## 2020-03-17 DIAGNOSIS — E1159 Type 2 diabetes mellitus with other circulatory complications: Secondary | ICD-10-CM | POA: Diagnosis not present

## 2020-03-17 DIAGNOSIS — E559 Vitamin D deficiency, unspecified: Secondary | ICD-10-CM | POA: Diagnosis not present

## 2020-03-17 DIAGNOSIS — E1142 Type 2 diabetes mellitus with diabetic polyneuropathy: Secondary | ICD-10-CM | POA: Diagnosis not present

## 2020-03-17 DIAGNOSIS — E785 Hyperlipidemia, unspecified: Secondary | ICD-10-CM | POA: Diagnosis not present

## 2020-03-17 DIAGNOSIS — I480 Paroxysmal atrial fibrillation: Secondary | ICD-10-CM | POA: Diagnosis not present

## 2020-03-22 DIAGNOSIS — L400 Psoriasis vulgaris: Secondary | ICD-10-CM | POA: Diagnosis not present

## 2020-03-22 DIAGNOSIS — Z79899 Other long term (current) drug therapy: Secondary | ICD-10-CM | POA: Diagnosis not present

## 2020-03-31 ENCOUNTER — Emergency Department (HOSPITAL_COMMUNITY): Payer: Medicare Other

## 2020-03-31 ENCOUNTER — Inpatient Hospital Stay (HOSPITAL_COMMUNITY)
Admission: EM | Admit: 2020-03-31 | Discharge: 2020-04-10 | DRG: 308 | Disposition: A | Discharge status: Home Health | Payer: Medicare Other | Source: Ambulatory Visit | Attending: Internal Medicine | Admitting: Internal Medicine

## 2020-03-31 DIAGNOSIS — I2721 Secondary pulmonary arterial hypertension: Secondary | ICD-10-CM | POA: Diagnosis present

## 2020-03-31 DIAGNOSIS — I2510 Atherosclerotic heart disease of native coronary artery without angina pectoris: Secondary | ICD-10-CM | POA: Diagnosis present

## 2020-03-31 DIAGNOSIS — R000 Tachycardia, unspecified: Secondary | ICD-10-CM | POA: Diagnosis not present

## 2020-03-31 DIAGNOSIS — E7800 Pure hypercholesterolemia, unspecified: Secondary | ICD-10-CM | POA: Diagnosis present

## 2020-03-31 DIAGNOSIS — I4891 Unspecified atrial fibrillation: Secondary | ICD-10-CM | POA: Diagnosis not present

## 2020-03-31 DIAGNOSIS — I700 Atherosclerosis of aorta: Secondary | ICD-10-CM | POA: Diagnosis present

## 2020-03-31 DIAGNOSIS — I10 Essential (primary) hypertension: Secondary | ICD-10-CM | POA: Diagnosis present

## 2020-03-31 DIAGNOSIS — Z20822 Contact with and (suspected) exposure to covid-19: Secondary | ICD-10-CM | POA: Diagnosis not present

## 2020-03-31 DIAGNOSIS — R0902 Hypoxemia: Secondary | ICD-10-CM | POA: Diagnosis not present

## 2020-03-31 DIAGNOSIS — E785 Hyperlipidemia, unspecified: Secondary | ICD-10-CM | POA: Diagnosis present

## 2020-03-31 DIAGNOSIS — I5021 Acute systolic (congestive) heart failure: Secondary | ICD-10-CM | POA: Diagnosis present

## 2020-03-31 DIAGNOSIS — Z87891 Personal history of nicotine dependence: Secondary | ICD-10-CM

## 2020-03-31 DIAGNOSIS — Z7984 Long term (current) use of oral hypoglycemic drugs: Secondary | ICD-10-CM

## 2020-03-31 DIAGNOSIS — J90 Pleural effusion, not elsewhere classified: Secondary | ICD-10-CM | POA: Diagnosis not present

## 2020-03-31 DIAGNOSIS — L409 Psoriasis, unspecified: Secondary | ICD-10-CM | POA: Diagnosis present

## 2020-03-31 DIAGNOSIS — I447 Left bundle-branch block, unspecified: Secondary | ICD-10-CM | POA: Diagnosis present

## 2020-03-31 DIAGNOSIS — Z79899 Other long term (current) drug therapy: Secondary | ICD-10-CM

## 2020-03-31 DIAGNOSIS — I110 Hypertensive heart disease with heart failure: Secondary | ICD-10-CM | POA: Diagnosis present

## 2020-03-31 DIAGNOSIS — Z7901 Long term (current) use of anticoagulants: Secondary | ICD-10-CM

## 2020-03-31 DIAGNOSIS — Z8249 Family history of ischemic heart disease and other diseases of the circulatory system: Secondary | ICD-10-CM

## 2020-03-31 DIAGNOSIS — E669 Obesity, unspecified: Secondary | ICD-10-CM | POA: Diagnosis present

## 2020-03-31 DIAGNOSIS — R808 Other proteinuria: Secondary | ICD-10-CM | POA: Diagnosis not present

## 2020-03-31 DIAGNOSIS — E119 Type 2 diabetes mellitus without complications: Secondary | ICD-10-CM | POA: Diagnosis present

## 2020-03-31 DIAGNOSIS — H9190 Unspecified hearing loss, unspecified ear: Secondary | ICD-10-CM | POA: Diagnosis present

## 2020-03-31 DIAGNOSIS — I480 Paroxysmal atrial fibrillation: Principal | ICD-10-CM | POA: Diagnosis present

## 2020-03-31 DIAGNOSIS — E1159 Type 2 diabetes mellitus with other circulatory complications: Secondary | ICD-10-CM | POA: Diagnosis not present

## 2020-03-31 DIAGNOSIS — R001 Bradycardia, unspecified: Secondary | ICD-10-CM | POA: Diagnosis not present

## 2020-03-31 DIAGNOSIS — E876 Hypokalemia: Secondary | ICD-10-CM | POA: Diagnosis not present

## 2020-03-31 DIAGNOSIS — R0602 Shortness of breath: Secondary | ICD-10-CM | POA: Diagnosis not present

## 2020-03-31 DIAGNOSIS — Z6831 Body mass index (BMI) 31.0-31.9, adult: Secondary | ICD-10-CM

## 2020-03-31 DIAGNOSIS — I4892 Unspecified atrial flutter: Secondary | ICD-10-CM | POA: Diagnosis present

## 2020-03-31 DIAGNOSIS — I959 Hypotension, unspecified: Secondary | ICD-10-CM | POA: Diagnosis not present

## 2020-03-31 DIAGNOSIS — I499 Cardiac arrhythmia, unspecified: Secondary | ICD-10-CM | POA: Diagnosis not present

## 2020-03-31 DIAGNOSIS — K5900 Constipation, unspecified: Secondary | ICD-10-CM | POA: Diagnosis not present

## 2020-03-31 DIAGNOSIS — R0600 Dyspnea, unspecified: Secondary | ICD-10-CM | POA: Diagnosis present

## 2020-03-31 HISTORY — DX: Atherosclerotic heart disease of native coronary artery without angina pectoris: I25.10

## 2020-03-31 HISTORY — DX: Heart failure, unspecified: I50.9

## 2020-03-31 HISTORY — DX: Psoriasis, unspecified: L40.9

## 2020-03-31 LAB — BASIC METABOLIC PANEL
Anion gap: 15 (ref 5–15)
BUN: 23 mg/dL (ref 8–23)
CO2: 21 mmol/L — ABNORMAL LOW (ref 22–32)
Calcium: 9.3 mg/dL (ref 8.9–10.3)
Chloride: 106 mmol/L (ref 98–111)
Creatinine, Ser: 0.69 mg/dL (ref 0.44–1.00)
GFR calc Af Amer: >60 mL/min (ref >60)
GFR calc non Af Amer: >60 mL/min (ref >60)
Glucose, Bld: 150 mg/dL — ABNORMAL HIGH (ref 70–99)
Potassium: 4 mmol/L (ref 3.5–5.1)
Sodium: 142 mmol/L (ref 135–145)

## 2020-03-31 LAB — TROPONIN I (HIGH SENSITIVITY)
Troponin I (High Sensitivity): 16 ng/L (ref <18)
Troponin I (High Sensitivity): 17 ng/L (ref <18)

## 2020-03-31 LAB — CBC
HCT: 43.8 % (ref 36.0–46.0)
Hemoglobin: 13.7 g/dL (ref 12.0–15.0)
MCH: 28.5 pg (ref 26.0–34.0)
MCHC: 31.3 g/dL (ref 30.0–36.0)
MCV: 91.1 fL (ref 80.0–100.0)
Platelets: 228 10*3/uL (ref 150–400)
RBC: 4.81 MIL/uL (ref 3.87–5.11)
RDW: 13.9 % (ref 11.5–15.5)
WBC: 5.8 10*3/uL (ref 4.0–10.5)
nRBC: 0 % (ref 0.0–0.2)

## 2020-03-31 NOTE — ED Triage Notes (Signed)
BIB EMS from PCP. Patient reports exertional SOB X2 weeks, noted to be in afib RVR and sent over for further eval. Denies pain.

## 2020-04-01 ENCOUNTER — Other Ambulatory Visit: Payer: Self-pay

## 2020-04-01 ENCOUNTER — Inpatient Hospital Stay (HOSPITAL_COMMUNITY): Payer: Medicare Other

## 2020-04-01 ENCOUNTER — Observation Stay (HOSPITAL_COMMUNITY): Payer: Medicare Other

## 2020-04-01 ENCOUNTER — Encounter (HOSPITAL_COMMUNITY): Payer: Self-pay | Admitting: Emergency Medicine

## 2020-04-01 DIAGNOSIS — I5021 Acute systolic (congestive) heart failure: Secondary | ICD-10-CM | POA: Diagnosis present

## 2020-04-01 DIAGNOSIS — Z8249 Family history of ischemic heart disease and other diseases of the circulatory system: Secondary | ICD-10-CM | POA: Diagnosis not present

## 2020-04-01 DIAGNOSIS — E785 Hyperlipidemia, unspecified: Secondary | ICD-10-CM | POA: Diagnosis present

## 2020-04-01 DIAGNOSIS — L308 Other specified dermatitis: Secondary | ICD-10-CM | POA: Insufficient documentation

## 2020-04-01 DIAGNOSIS — K5900 Constipation, unspecified: Secondary | ICD-10-CM | POA: Diagnosis present

## 2020-04-01 DIAGNOSIS — E119 Type 2 diabetes mellitus without complications: Secondary | ICD-10-CM | POA: Diagnosis present

## 2020-04-01 DIAGNOSIS — E876 Hypokalemia: Secondary | ICD-10-CM | POA: Diagnosis not present

## 2020-04-01 DIAGNOSIS — I447 Left bundle-branch block, unspecified: Secondary | ICD-10-CM | POA: Diagnosis present

## 2020-04-01 DIAGNOSIS — R0602 Shortness of breath: Secondary | ICD-10-CM | POA: Diagnosis not present

## 2020-04-01 DIAGNOSIS — I4891 Unspecified atrial fibrillation: Secondary | ICD-10-CM | POA: Diagnosis not present

## 2020-04-01 DIAGNOSIS — L409 Psoriasis, unspecified: Secondary | ICD-10-CM | POA: Diagnosis present

## 2020-04-01 DIAGNOSIS — H9190 Unspecified hearing loss, unspecified ear: Secondary | ICD-10-CM | POA: Diagnosis present

## 2020-04-01 DIAGNOSIS — R001 Bradycardia, unspecified: Secondary | ICD-10-CM | POA: Diagnosis not present

## 2020-04-01 DIAGNOSIS — I340 Nonrheumatic mitral (valve) insufficiency: Secondary | ICD-10-CM | POA: Diagnosis not present

## 2020-04-01 DIAGNOSIS — E7800 Pure hypercholesterolemia, unspecified: Secondary | ICD-10-CM | POA: Diagnosis present

## 2020-04-01 DIAGNOSIS — I110 Hypertensive heart disease with heart failure: Secondary | ICD-10-CM | POA: Diagnosis present

## 2020-04-01 DIAGNOSIS — Z6831 Body mass index (BMI) 31.0-31.9, adult: Secondary | ICD-10-CM | POA: Diagnosis not present

## 2020-04-01 DIAGNOSIS — I480 Paroxysmal atrial fibrillation: Secondary | ICD-10-CM | POA: Diagnosis present

## 2020-04-01 DIAGNOSIS — Z7901 Long term (current) use of anticoagulants: Secondary | ICD-10-CM | POA: Diagnosis not present

## 2020-04-01 DIAGNOSIS — R0600 Dyspnea, unspecified: Secondary | ICD-10-CM | POA: Diagnosis present

## 2020-04-01 DIAGNOSIS — I10 Essential (primary) hypertension: Secondary | ICD-10-CM | POA: Diagnosis present

## 2020-04-01 DIAGNOSIS — I5020 Unspecified systolic (congestive) heart failure: Secondary | ICD-10-CM

## 2020-04-01 DIAGNOSIS — I700 Atherosclerosis of aorta: Secondary | ICD-10-CM | POA: Diagnosis present

## 2020-04-01 DIAGNOSIS — I4892 Unspecified atrial flutter: Secondary | ICD-10-CM | POA: Diagnosis present

## 2020-04-01 DIAGNOSIS — I361 Nonrheumatic tricuspid (valve) insufficiency: Secondary | ICD-10-CM | POA: Diagnosis not present

## 2020-04-01 DIAGNOSIS — I2721 Secondary pulmonary arterial hypertension: Secondary | ICD-10-CM | POA: Diagnosis present

## 2020-04-01 DIAGNOSIS — Z87891 Personal history of nicotine dependence: Secondary | ICD-10-CM | POA: Diagnosis not present

## 2020-04-01 DIAGNOSIS — Z20822 Contact with and (suspected) exposure to covid-19: Secondary | ICD-10-CM | POA: Diagnosis present

## 2020-04-01 DIAGNOSIS — E669 Obesity, unspecified: Secondary | ICD-10-CM | POA: Diagnosis present

## 2020-04-01 DIAGNOSIS — I2510 Atherosclerotic heart disease of native coronary artery without angina pectoris: Secondary | ICD-10-CM | POA: Diagnosis present

## 2020-04-01 DIAGNOSIS — I959 Hypotension, unspecified: Secondary | ICD-10-CM | POA: Diagnosis not present

## 2020-04-01 DIAGNOSIS — Z7984 Long term (current) use of oral hypoglycemic drugs: Secondary | ICD-10-CM | POA: Diagnosis not present

## 2020-04-01 LAB — HEMOGLOBIN A1C
Hgb A1c MFr Bld: 7.5 % — ABNORMAL HIGH (ref 4.8–5.6)
Mean Plasma Glucose: 168.55 mg/dL

## 2020-04-01 LAB — MAGNESIUM: Magnesium: 2 mg/dL (ref 1.7–2.4)

## 2020-04-01 LAB — ECHOCARDIOGRAM COMPLETE
Area-P 1/2: 4.83 cm2
Height: 71 in
Weight: 2878.33 oz

## 2020-04-01 LAB — SARS CORONAVIRUS 2 BY RT PCR (HOSPITAL ORDER, PERFORMED IN ~~LOC~~ HOSPITAL LAB): SARS Coronavirus 2: NEGATIVE

## 2020-04-01 LAB — BRAIN NATRIURETIC PEPTIDE: B Natriuretic Peptide: 180.8 pg/mL — ABNORMAL HIGH (ref 0.0–100.0)

## 2020-04-01 LAB — PROCALCITONIN: Procalcitonin: <0.1 ng/mL

## 2020-04-01 LAB — GLUCOSE, CAPILLARY
Glucose-Capillary: 178 mg/dL — ABNORMAL HIGH (ref 70–99)
Glucose-Capillary: 142 mg/dL — ABNORMAL HIGH (ref 70–99)
Glucose-Capillary: 174 mg/dL — ABNORMAL HIGH (ref 70–99)

## 2020-04-01 LAB — TSH: TSH: 1.866 u[IU]/mL (ref 0.350–4.500)

## 2020-04-01 MED ORDER — LISINOPRIL 40 MG PO TABS
40.0000 mg | ORAL_TABLET | Freq: Every day | ORAL | Status: DC
  Administered 2020-04-03: 40 mg via ORAL
  Filled 2020-04-01 (×3): qty 1

## 2020-04-01 MED ORDER — DILTIAZEM HCL-DEXTROSE 125-5 MG/125ML-% IV SOLN (PREMIX)
5.0000 mg/h | INTRAVENOUS | Status: DC
  Administered 2020-04-01: 5 mg/h via INTRAVENOUS
  Filled 2020-04-01 (×2): qty 125

## 2020-04-01 MED ORDER — DILTIAZEM LOAD VIA INFUSION
10.0000 mg | Freq: Once | INTRAVENOUS | Status: AC
  Administered 2020-04-01: 10 mg via INTRAVENOUS
  Filled 2020-04-01: qty 10

## 2020-04-01 MED ORDER — SODIUM CHLORIDE 0.9% FLUSH
3.0000 mL | INTRAVENOUS | Status: DC | PRN
  Administered 2020-04-01: 3 mL via INTRAVENOUS

## 2020-04-01 MED ORDER — INSULIN ASPART 100 UNIT/ML ~~LOC~~ SOLN
0.0000 [IU] | Freq: Three times a day (TID) | SUBCUTANEOUS | Status: DC
  Administered 2020-04-01 – 2020-04-02 (×4): 3 [IU] via SUBCUTANEOUS
  Administered 2020-04-03: 2 [IU] via SUBCUTANEOUS
  Administered 2020-04-03 (×2): 3 [IU] via SUBCUTANEOUS
  Administered 2020-04-04: 5 [IU] via SUBCUTANEOUS
  Administered 2020-04-04: 2 [IU] via SUBCUTANEOUS
  Administered 2020-04-04: 3 [IU] via SUBCUTANEOUS
  Administered 2020-04-05: 8 [IU] via SUBCUTANEOUS
  Administered 2020-04-06: 2 [IU] via SUBCUTANEOUS
  Administered 2020-04-06 – 2020-04-07 (×2): 3 [IU] via SUBCUTANEOUS
  Administered 2020-04-07 – 2020-04-08 (×3): 2 [IU] via SUBCUTANEOUS
  Administered 2020-04-08: 3 [IU] via SUBCUTANEOUS

## 2020-04-01 MED ORDER — SODIUM CHLORIDE 0.9 % IV SOLN: 250.0000 mL | INTRAVENOUS | Status: DC | PRN

## 2020-04-01 MED ORDER — ACETAMINOPHEN 325 MG PO TABS: 650.0000 mg | ORAL_TABLET | ORAL | Status: DC | PRN

## 2020-04-01 MED ORDER — FUROSEMIDE 10 MG/ML IJ SOLN
80.0000 mg | Freq: Two times a day (BID) | INTRAMUSCULAR | Status: DC
  Administered 2020-04-01 – 2020-04-04 (×6): 80 mg via INTRAVENOUS
  Filled 2020-04-01 (×7): qty 8

## 2020-04-01 MED ORDER — SPIRONOLACTONE 12.5 MG HALF TABLET
12.5000 mg | ORAL_TABLET | Freq: Every day | ORAL | Status: DC
  Administered 2020-04-01 – 2020-04-03 (×2): 12.5 mg via ORAL
  Filled 2020-04-01 (×5): qty 1

## 2020-04-01 MED ORDER — PERFLUTREN LIPID MICROSPHERE
1.0000 mL | INTRAVENOUS | Status: AC | PRN
  Administered 2020-04-01: 3 mL via INTRAVENOUS
  Filled 2020-04-01: qty 10

## 2020-04-01 MED ORDER — DIGOXIN 0.25 MG/ML IJ SOLN
0.2500 mg | Freq: Once | INTRAMUSCULAR | Status: DC
  Filled 2020-04-01: qty 2

## 2020-04-01 MED ORDER — METOPROLOL TARTRATE 25 MG PO TABS
25.0000 mg | ORAL_TABLET | Freq: Four times a day (QID) | ORAL | Status: DC
  Administered 2020-04-01 – 2020-04-03 (×5): 25 mg via ORAL
  Filled 2020-04-01 (×6): qty 1

## 2020-04-01 MED ORDER — FUROSEMIDE 10 MG/ML IJ SOLN
40.0000 mg | Freq: Once | INTRAMUSCULAR | Status: AC
  Administered 2020-04-01: 40 mg via INTRAVENOUS
  Filled 2020-04-01: qty 4

## 2020-04-01 MED ORDER — ONDANSETRON HCL 4 MG/2ML IJ SOLN: 4.0000 mg | Freq: Four times a day (QID) | INTRAMUSCULAR | Status: DC | PRN

## 2020-04-01 MED ORDER — SODIUM CHLORIDE 0.9% FLUSH
3.0000 mL | Freq: Two times a day (BID) | INTRAVENOUS | Status: DC
  Administered 2020-04-01 – 2020-04-10 (×14): 3 mL via INTRAVENOUS

## 2020-04-01 MED ORDER — APIXABAN 5 MG PO TABS
5.0000 mg | ORAL_TABLET | Freq: Two times a day (BID) | ORAL | Status: DC
  Administered 2020-04-01 – 2020-04-10 (×19): 5 mg via ORAL
  Filled 2020-04-01 (×19): qty 1

## 2020-04-01 MED ORDER — FUROSEMIDE 10 MG/ML IJ SOLN
40.0000 mg | Freq: Two times a day (BID) | INTRAMUSCULAR | Status: DC
  Administered 2020-04-01: 40 mg via INTRAVENOUS
  Filled 2020-04-01: qty 4

## 2020-04-01 MED ORDER — IOHEXOL 350 MG/ML SOLN
80.0000 mL | Freq: Once | INTRAVENOUS | Status: AC | PRN
  Administered 2020-04-01: 58 mL via INTRAVENOUS

## 2020-04-01 MED ORDER — INSULIN ASPART 100 UNIT/ML ~~LOC~~ SOLN
0.0000 [IU] | Freq: Every day | SUBCUTANEOUS | Status: DC
  Administered 2020-04-02: 2 [IU] via SUBCUTANEOUS

## 2020-04-01 MED ORDER — ATORVASTATIN CALCIUM 80 MG PO TABS
80.0000 mg | ORAL_TABLET | Freq: Every day | ORAL | Status: DC
  Administered 2020-04-01 – 2020-04-10 (×10): 80 mg via ORAL
  Filled 2020-04-01 (×10): qty 1

## 2020-04-01 MED ORDER — INSULIN ASPART 100 UNIT/ML ~~LOC~~ SOLN: 0.0000 [IU] | Freq: Three times a day (TID) | SUBCUTANEOUS | Status: DC

## 2020-04-01 MED ORDER — LISINOPRIL 20 MG PO TABS
20.0000 mg | ORAL_TABLET | Freq: Every day | ORAL | Status: DC
  Administered 2020-04-01: 20 mg via ORAL
  Filled 2020-04-01: qty 1

## 2020-04-01 NOTE — Progress Notes (Signed)
Echocardiogram 2D Echocardiogram has been performed.  Oneal Deputy Aaran Enberg 04/01/2020, 12:40 PM

## 2020-04-01 NOTE — ED Notes (Signed)
Echo in process 

## 2020-04-01 NOTE — ED Notes (Signed)
Please update niece Mickel Baas with updates...[phone]

## 2020-04-01 NOTE — Progress Notes (Signed)
   04/01/20 1942  Assess: MEWS Score  Temp 97.8 F (36.6 C)  BP 106/74  Pulse Rate (!) 145  ECG Heart Rate (!) 117  Resp 14  SpO2 96 %  O2 Device Room Air  Assess: MEWS Score  MEWS Temp 0  MEWS Systolic 0  MEWS Pulse 2  MEWS RR 0  MEWS LOC 0  MEWS Score 2  MEWS Score Color Yellow  Assess: if the MEWS score is Yellow or Red  Were vital signs taken at a resting state? Yes  Focused Assessment No change from prior assessment  Early Detection of Sepsis Score *See Row Information* Low  MEWS guidelines implemented *See Row Information* No, previously yellow, continue vital signs every 4 hours  Treat  Pain Scale 0-10  Pain Score 0  Notify: Charge Nurse/RN  Name of Charge Nurse/RN Notified Jequetta RN  Date Charge Nurse/RN Notified 04/01/20  Time Charge Nurse/RN Notified 2000  Notify: Provider  Provider Name/Title n/a  Notify: Rapid Response  Name of Rapid Response RN Notified n/a  Document  Patient Outcome Not stable and remains on department  Progress note created (see row info) Yes   Previous yellow MEWS. Pt asymptomatic, resting comfortably in bed. Administered scheduled meds. No complains at this time. Will continue to monitor.

## 2020-04-01 NOTE — H&P (Signed)
History and Physical    Melissa Bishop HUD:[id number] DOB: 09-28-1945 DOA: 03/31/2020  Referring MD/NP/PA: Shela Leff, MD PCP: [name], MD  Patient coming from: PCP office via EMS  Chief Complaint: Shortness of breath  I have personally briefly reviewed patient's old medical records in [place]   HPI: Melissa Bishop is a 74 y.o. female with medical history significant of hypertension, hyperlipidemia, psoriasis on Stelara, and diabetes mellitus type 2 presents with complaints of progressively worsening shortness of breath over the last 2 weeks.  History is obtained from the patient, but seems to possibly be unreliable at times as she answers questions differently when repeatedly asked.  Notes associated symptoms of a nonproductive cough, leg swelling,  intermittent palpitations, and constipation.  Notes that she has not had a bowel movement in 2 weeks.  Denies any prior history of having atrial fibrillation before in the past, but is on Eliquis which she reports taking for her blood pressure.  Denies any recent fever, chills, significant change in weight, chest pain, nausea, vomiting, diarrhea, dysuria, headaches, or muscle aches.  She reports that she does not currently smoke and quit several years ago, but does not quantify.  She had been seen in her primary care's office yesterday for symptoms and sent in via EMS.   ED Course: Upon admission into the emergency department patient was seen to be afebrile, with heart rates elevated up to 160s and A. fib with RVR, respirations 19-29, and all other vital signs maintained.  Labs were relatively unremarkable CBC, BMP, and troponin.  Chest x-ray showed bilateral opacities right lung greater than left along with bilateral pleural effusions.  COVID-19 screening was negative.  She has been started on a Cardizem drip IV.  TRH called to admit.  Review of Systems  Constitutional: Negative for chills and fever.  HENT: Negative for  congestion and nosebleeds.   Eyes: Negative for double vision and photophobia.  Respiratory: Positive for cough and shortness of breath. Negative for sputum production.   Cardiovascular: Positive for leg swelling.  Gastrointestinal: Positive for constipation. Negative for abdominal pain, nausea and vomiting.  Genitourinary: Negative for dysuria and hematuria.  Musculoskeletal: Negative for falls and myalgias.  Skin: Positive for rash.  Neurological: Negative for loss of consciousness and headaches.  Psychiatric/Behavioral: Negative for substance abuse. The patient is not nervous/anxious.     Past Medical History:  Diagnosis Date  . Diabetes mellitus without complication ([place])   . High cholesterol   . Hypertension   . Psoriasis     Past Surgical History:  Procedure Laterality Date  . BREAST EXCISIONAL BIOPSY Right ? more than 30 years  . BREAST SURGERY       reports that she has quit smoking. She has never used smokeless tobacco. She reports that she does not drink alcohol and does not use drugs.  No Known Allergies  Family History  Problem Relation Age of Onset  . Heart disease Mother        Required pacemaker in her 39s    Prior to Admission medications   Medication Sig Start Date End Date Taking? Authorizing Provider  apixaban (ELIQUIS) 5 MG TABS tablet Take 5 mg by mouth 2 (two) times daily.   Yes [provider]  atorvastatin (LIPITOR) 80 MG tablet Take 80 mg by mouth daily. 12/24/19  Yes [provider]  lisinopril (PRINIVIL,ZESTRIL) 20 MG tablet Take 20 mg by mouth daily.    Yes [provider]  metFORMIN (GLUCOPHAGE) 500  MG tablet Take 500 mg by mouth 2 (two) times daily with a meal.   Yes [provider]  Multiple Vitamins-Minerals (CENTRUM SILVER ADULT 50+ PO) Take 1 tablet by mouth daily.   Yes [provider]  Multiple Vitamins-Minerals (PRESERVISION AREDS 2) CAPS Take 1 capsule by mouth daily.    Yes [provider]  Ustekinumab (STELARA) 45 MG/0.5ML SOLN Inject 45 mg into the skin every 3 (three) months.    Yes [provider]  HYDROcodone-acetaminophen (NORCO) 5-325 MG tablet Take 1 tablet by mouth every 6 (six) hours as needed for severe pain. Patient not taking: Reported on 04/01/2020 11/20/18   Julianne Rice, MD  ondansetron (ZOFRAN) 4 MG tablet Take 1 tablet (4 mg total) by mouth every 6 (six) hours as needed for nausea or vomiting. Patient not taking: Reported on 04/01/2020 11/20/18   Julianne Rice, MD    Physical Exam:  Constitutional: Elderly female who appears to be in no acute distress Vitals:   04/01/20 0645 04/01/20 0700 04/01/20 0730 04/01/20 0745  BP: (!) 131/104 116/85    Pulse: (!) 147 (!) 147 (!) 145 (!) 145  Resp: (!) 28 19 (!) 29   Temp:      TempSrc:      SpO2: 95% 94% 94% 95%  Weight:      Height:       Eyes: PERRL, lids and conjunctivae normal ENMT: Mucous membranes are moist. Posterior pharynx clear of any exudate or lesions.  Hard of hearing. Neck: normal, supple, no masses, no thyromegaly.  JVD appreciated. Respiratory: Mildly tachypneic with positive crackles appreciated both lung fields.  Currently maintaining O2 saturations on room air. Cardiovascular: Irregular irregular and fast, no murmurs / rubs / gallops.  At least 2+ pitting bilateral lower extremity edema. 2+ pedal pulses. No carotid bruits.  Abdomen: Protuberant abdomen.  No masses palpated. No hepatosplenomegaly.  Musculoskeletal: no clubbing / cyanosis. No joint deformity upper and lower extremities. Good ROM, no contractures. Normal muscle tone.  Skin: no rashes, lesions, ulcers. No induration Neurologic: CN 2-12 grossly intact. Sensation intact, DTR normal. Strength 5/5 in all 4.  Tremor present. Psychiatric: Alert and appears oriented x3.  However, mainly answers questions differently   Labs on Admission: I have personally reviewed following labs and imaging  studies  CBC: Recent Labs  Lab 03/31/20 1759  WBC 5.8  HGB 13.7  HCT 43.8  MCV 91.1  PLT 937   Basic Metabolic Panel: Recent Labs  Lab 03/31/20 1759  NA 142  K 4.0  CL 106  CO2 21*  GLUCOSE 150*  BUN 23  CREATININE 0.69  CALCIUM 9.3   GFR: Estimated Creatinine Clearance: 69 mL/min (by C-G formula based on SCr of 0.69 mg/dL). Liver Function Tests: No results for input(s): AST, ALT, ALKPHOS, BILITOT, PROT, ALBUMIN in the last 168 hours. No results for input(s): LIPASE, AMYLASE in the last 168 hours. No results for input(s): AMMONIA in the last 168 hours. Coagulation Profile: No results for input(s): INR, PROTIME in the last 168 hours. Cardiac Enzymes: No results for input(s): CKTOTAL, CKMB, CKMBINDEX, TROPONINI in the last 168 hours. BNP (last 3 results) No results for input(s): PROBNP in the last 8760 hours. HbA1C: No results for input(s): HGBA1C in the last 72 hours. CBG: No results for input(s): GLUCAP in the last 168 hours. Lipid Profile: No results for input(s): CHOL, HDL, LDLCALC, TRIG, CHOLHDL, LDLDIRECT in the last 72 hours. Thyroid Function Tests: No results for input(s): TSH, T4TOTAL,  FREET4, T3FREE, THYROIDAB in the last 72 hours. Anemia Panel: No results for input(s): VITAMINB12, FOLATE, FERRITIN, TIBC, IRON, RETICCTPCT in the last 72 hours. Urine analysis:    Component Value Date/Time   COLORURINE STRAW (A) 11/20/2018 1702   APPEARANCEUR CLEAR 11/20/2018 1702   LABSPEC 1.041 (H) 11/20/2018 1702   PHURINE 6.0 11/20/2018 1702   GLUCOSEU 50 (A) 11/20/2018 1702   HGBUR SMALL (A) 11/20/2018 1702   BILIRUBINUR NEGATIVE 11/20/2018 1702   KETONESUR 20 (A) 11/20/2018 1702   PROTEINUR NEGATIVE 11/20/2018 1702   NITRITE NEGATIVE 11/20/2018 1702   LEUKOCYTESUR NEGATIVE 11/20/2018 1702   Sepsis Labs: Recent Results (from the past 240 hour(s))  SARS Coronavirus 2 by RT PCR (hospital order, performed in [place] hospital lab) Nasopharyngeal  Nasopharyngeal Swab     Status: None   Collection Time: 04/01/20  5:49 AM   Specimen: Nasopharyngeal Swab  Result Value Ref Range Status   SARS Coronavirus 2 NEGATIVE NEGATIVE Final    Comment: (NOTE) SARS-CoV-2 target nucleic acids are NOT DETECTED.  The SARS-CoV-2 RNA is generally detectable in upper and lower respiratory specimens during the acute phase of infection. The lowest concentration of SARS-CoV-2 viral copies this assay can detect is 250 copies / mL. A negative result does not preclude SARS-CoV-2 infection and should not be used as the sole basis for treatment or other patient management decisions.  A negative result may occur with improper specimen collection / handling, submission of specimen other than nasopharyngeal swab, presence of viral mutation(s) within the areas targeted by this assay, and inadequate number of viral copies (<250 copies / mL). A negative result must be combined with clinical observations, patient history, and epidemiological information.  Fact Sheet for Patients:   StrictlyIdeas.no  Fact Sheet for Healthcare Providers: BankingDealers.co.za  This test is not yet approved or  cleared by the [place] FDA and has been authorized for detection and/or diagnosis of SARS-CoV-2 by FDA under an Emergency Use Authorization (EUA).  This EUA will remain in effect (meaning this test can be used) for the duration of the COVID-19 declaration under Section 564(b)(1) of the Act, 21 U.S.C. section 360bbb-3(b)(1), unless the authorization is terminated or revoked sooner.  Performed at [facility], [place] [address] [place], [place] [address]      Radiological Exams on Admission: DG Chest 2 View  Result Date: 03/31/2020 CLINICAL DATA:  Shortness of breath x2 weeks. EXAM: CHEST - 2 VIEW COMPARISON:  March 31, 2020 (3:41 p.m.) FINDINGS: Mild, diffuse, chronic appearing increased interstitial lung  markings are noted. Predominant stable mild to moderate severity areas of atelectasis and/or infiltrate are seen within the bilateral lung bases, right greater than left. Small bilateral pleural effusions are seen. No pneumothorax is identified. The heart size and mediastinal contours are within normal limits. The visualized skeletal structures are unremarkable. IMPRESSION: 1. Mild to moderate severity areas of atelectasis and/or infiltrate within the bilateral lung bases, right greater than left. 2. Small bilateral pleural effusions. Electronically Signed   By: Virgina Norfolk M.D.   On: 03/31/2020 17:38    EKG: Independently reviewed.  Atrial fibrillation with RVR at 165 bpm  Assessment/Plan  Atrial fibrillation with rapid ventricular response: Acute.  Patient presented in atrial fibrillation with heart rates into the 165s. CHA2DS2-VASc score = 5(age, gender, HTN, DM, what appears to be new onset CHF.  She was started on a diltiazem drip, but heart rate still elevated into the 130s.  It appears she has been  on Eliquis, but patient does not know the reason. -Admit to progressive bed -Continue diltiazem drip -Goal potassium 4 and magnesium 2 -Check TSH and BNP  -Check echocardiogram  -Give digoxin 0.25 mg IV x1 dose now -Continue Eliquis -Cardiology consulted, will follow for further recommendation  Diastolic CHF exacerbation: Acute.  Patient noted to have least 2+ pitting edema the bilateral lower extremities.  Last echocardiogram revealed EF of 60 -65% in 2018 with LVH.  CT angiogram of the chest showing small bilateral pleural effusions with moderate to severe areas of atelectasis and/or infiltrate with right side being larger than the left. -Heart failure order set utilized  -Intake and output -Daily weights -Follow-up BNP -Lasix 40 mg IV twice daily -Reassess for the need of diuresis  Dyspnea: Patient reported complaints of a cough over the last 2 weeks and shortness of breath.   Currently maintaining O2 saturations on room air.  Imaging studies concerning for atelectasis and/or possible infiltrates.  Otherwise patient appears to be afebrile without elevated white blood cell count -Add-on procalcitonin   Diabetes mellitus type 2, non-insulin-dependent: Chronic.  Patient presents with initial blood glucose of 150.  No recent hemoglobin A1c is available.  Home medications include Metformin 500 mg twice daily.  -Hypoglycemic protocol -Hold Metformin -Check hemoglobin A1c -CBGs before every meal and nightly with moderate SSI -Adjust insulin regimen as needed  Essential hypertension: Blood pressures currently maintained Home blood pressure medications include lisinopril 20 mg daily. -Continue lisinopril  Psoriasis on immunosuppressive therapy: Home medications include Stelara.  -Continue Stelara in outpatient setting   Constipation: Patient reportedly has not had a bowel movement in 2 weeks. -Check abdominal x-ray  DVT prophylaxis: Eliquis Code Status: Full Family Communication: Patient declined need to update any family at this time. Disposition Plan: Likely discharge home in 2 to 3 days Consults called: Cardiology Admission status: Inpatient  Norval Morton MD [facility] Pager [phone]   If 7PM-7AM, please contact night-coverage [email or web address] Password [email or web address]  04/01/2020, 8:04 AM

## 2020-04-01 NOTE — Progress Notes (Signed)
   04/01/20 1322  Assess: MEWS Score  Temp (!) 97.4 F (36.3 C)  BP (!) 117/94  Pulse Rate (!) 143  Resp 20  Level of Consciousness Alert  SpO2 95 %  O2 Device Room Air  Patient Activity (if Appropriate) In bed  Assess: MEWS Score  MEWS Temp 0  MEWS Systolic 0  MEWS Pulse 3  MEWS RR 0  MEWS LOC 0  MEWS Score 3  MEWS Score Color Yellow  Assess: if the MEWS score is Yellow or Red  Were vital signs taken at a resting state? Yes  Focused Assessment No change from prior assessment  Early Detection of Sepsis Score *See Row Information* Low  MEWS guidelines implemented *See Row Information* No, vital signs rechecked  Treat  Pain Scale 0-10  Pain Score 0  Take Vital Signs  Increase Vital Sign Frequency  Yellow: Q 2hr X 2 then Q 4hr X 2, if remains yellow, continue Q 4hrs  Escalate  MEWS: Escalate Red: discuss with charge nurse/RN and provider, consider discussing with RRT  Notify: Charge Nurse/RN  Name of Charge Nurse/RN Notified Eun Rn  Notify: Estate agent Name/Title n/a  Notify: Rapid Response  Name of Rapid Response RN Notified n/a  Document  Patient Outcome Not stable and remains on department  Progress note created (see row info) Yes  Patient was new admit with high Heart rate MD aware gave meds to help lower HR.

## 2020-04-01 NOTE — ED Notes (Signed)
Pt back from CT

## 2020-04-01 NOTE — Progress Notes (Signed)
Niece the patient POA states that pt has legal guardian papers in the chart.

## 2020-04-01 NOTE — Consult Note (Signed)
Cardiology Consultation:   Patient ID: Melissa Bishop MRN: [id number]; DOB: [birth year]  Admit date: 03/31/2020 Date of Consult: 04/01/2020  Primary Care Provider: [name], MD [facility] Cardiologist: No primary care provider on file.  [facility] Electrophysiologist:  None    Patient Profile:   Melissa Bishop is a 74 y.o. female with a hx of cognitive impairment, diabetes type 2, psoriasis, hypertension, hyperlipidemia, A. fib on Eliquis who is being seen today for the evaluation of CHF and A. fib at the request of Dr. Tamala Julian.  History of Present Illness:   Melissa Bishop is been seen in the A. fib clinic office in the past.  It appears she had an appointment in September 2018 for follow-up of new onset A. fib.  He was started on Eliquis with a CHA2DS2-VASc of 4.  At the follow-up patient was in sinus rhythm.  Echo was ordered which showed LVEF 60 to 65% mild LVH.    Patient presented to the ED 04/01/20 for coughing for the last 2 weeks. She says coughing has gotten worse and persistent and that's why she came in. She denies associated fever, chills. No abd pain, nausea, or vomiting. Denied chest pain, sob, palpitations, dizziness, lightheadedness. She did note some swelling on her lower legs. The patient lives with her mother who she helps care for. No tobacco, drug, use. No prior history of stroke, or MI. No bleeding issues on Eliquis, and denies missing doses.   In the ED blood pressure 151/97, pulse 75, afebrile, respiratory rate 17, 98% O2.  Labs showed sodium 142, potassium 4.0, chloride 106, CO2 21, glucose 150, creatinine 0.69, BUN 23, calcium 9.3.  WBC 5.8, hemoglobin 13.7, platelets 228. HS troponin 16>17.  BNP 180.  EKG showed A. fib RVR whith IVCD. Tele shows aflutter with rates in the 140s. CXR x-ray showed mild to moderate severity of atelectasis versus infiltrate right greater than left, small bilateral pleural effusions.  CTA chest was negative for PE no aortic aneurysm or  dissection possible pneumonia and pulmonary edema.  Patient was started on dilt drip and admitted for further work-up.    Past Medical History:  Diagnosis Date   Diabetes mellitus without complication (HCC)    High cholesterol    Hypertension    Psoriasis     Past Surgical History:  Procedure Laterality Date   BREAST EXCISIONAL BIOPSY Right ? more than 30 years   BREAST SURGERY       Home Medications:  Prior to Admission medications   Medication Sig Start Date End Date Taking? Authorizing Provider  apixaban (ELIQUIS) 5 MG TABS tablet Take 5 mg by mouth 2 (two) times daily.   Yes [provider]  atorvastatin (LIPITOR) 80 MG tablet Take 80 mg by mouth daily. 12/24/19  Yes [provider]  lisinopril (PRINIVIL,ZESTRIL) 20 MG tablet Take 20 mg by mouth daily.    Yes [provider]  metFORMIN (GLUCOPHAGE) 500 MG tablet Take 500 mg by mouth 2 (two) times daily with a meal.   Yes [provider]  Multiple Vitamins-Minerals (CENTRUM SILVER ADULT 50+ PO) Take 1 tablet by mouth daily.   Yes [provider]  Multiple Vitamins-Minerals (PRESERVISION AREDS 2) CAPS Take 1 capsule by mouth daily.    Yes [provider]  Ustekinumab (STELARA) 45 MG/0.5ML SOLN Inject 45 mg into the skin every 3 (three) months.    Yes [provider]  HYDROcodone-acetaminophen (NORCO) 5-325 MG tablet Take 1 tablet by mouth every 6 (  six) hours as needed for severe pain. Patient not taking: Reported on 04/01/2020 11/20/18   Julianne Rice, MD  ondansetron (ZOFRAN) 4 MG tablet Take 1 tablet (4 mg total) by mouth every 6 (six) hours as needed for nausea or vomiting. Patient not taking: Reported on 04/01/2020 11/20/18   Julianne Rice, MD    Inpatient Medications: Scheduled Meds:  apixaban  5 mg Oral BID   atorvastatin  80 mg Oral Daily   furosemide  40 mg Intravenous BID   insulin aspart  0-9 Units Subcutaneous TID WC   lisinopril  20 mg Oral  Daily   sodium chloride flush  3 mL Intravenous Q12H   Continuous Infusions:  sodium chloride     diltiazem (CARDIZEM) infusion 15 mg/hr (04/01/20 0702)   PRN Meds: sodium chloride, acetaminophen, ondansetron (ZOFRAN) IV, sodium chloride flush  Allergies:   No Known Allergies  Social History:   Social History   Socioeconomic History   Marital status: Single    Spouse name: Not on file   Number of children: Not on file   Years of education: Not on file   Highest education level: Not on file  Occupational History   Not on file  Tobacco Use   Smoking status: Former Smoker   Smokeless tobacco: Never Used  Substance and Sexual Activity   Alcohol use: No   Drug use: No   Sexual activity: Not on file  Other Topics Concern   Not on file  Social History Narrative   Not on file   Social Determinants of Health   Financial Resource Strain:    Difficulty of Paying Living Expenses: Not on file  Food Insecurity:    Worried About Estate manager/land agent of Food in the Last Year: Not on file   [employer] of Food in the Last Year: Not on file  Transportation Needs:    Lack of Transportation (Medical): Not on file   Lack of Transportation (Non-Medical): Not on file  Physical Activity:    Days of Exercise per Week: Not on file   Minutes of Exercise per Session: Not on file  Stress:    Feeling of Stress : Not on file  Social Connections:    Frequency of Communication with Friends and Family: Not on file   Frequency of Social Gatherings with Friends and Family: Not on file   Attends Religious Services: Not on file   Active Member of Clubs or Organizations: Not on file   Attends Archivist Meetings: Not on file   Marital Status: Not on file  Intimate Partner Violence:    Fear of Current or Ex-Partner: Not on file   Emotionally Abused: Not on file   Physically Abused: Not on file   Sexually Abused: Not on file    Family History:   *History reviewed.  No pertinent family history.   ROS:  Please see the history of present illness.  All other ROS reviewed and negative.     Physical Exam/Data:   Vitals:   04/01/20 0815 04/01/20 0915 04/01/20 1000 04/01/20 1030  BP: 129/81 (!) 128/95 (!) 112/49 119/74  Pulse: (!) 144 (!) 186 (!) 144 (!) 144  Resp: (!) 23 (!) 22 (!) 30 18  Temp:      TempSrc:      SpO2: 97% 94% 93% 93%  Weight:      Height:       No intake or output data in the 24 hours ending 04/01/20 1126 Last 3  Weights 03/31/2020 11/20/2018 05/06/2017  Weight (lbs) 179 lb 14.3 oz 180 lb 235 lb 12.8 oz  Weight (kg) 81.6 kg 81.647 kg 106.958 kg     Body mass index is 25.09 kg/m.  General:  Well nourished, well developed, in no acute distress HEENT: normal Lymph: no adenopathy Neck: no JVD Endocrine:  No thryomegaly Vascular: No carotid bruits; FA pulses 2+ bilaterally without bruits  Cardiac:  normal S1, S2; Reg Ireg, tachycardic; no murmur  Lungs:  Diminished at bases  Abd: soft, nontender, no hepatomegaly  Ext: 2+B/L edema Musculoskeletal:  No deformities, BUE and BLE strength normal and equal Skin: warm and dry  Neuro:  CNs 2-12 intact, no focal abnormalities noted Psych:  Normal affect   EKG:  The EKG was personally reviewed and demonstrates:  Afib, RVR 128, bpm, IVCD Telemetry:  Telemetry was personally reviewed and demonstrates:  Aflutter, HR 140s  Relevant CV Studies:  Echo 05/2017 Study Conclusions   - Left ventricle: The cavity size was normal. Wall thickness was  increased in a pattern of mild LVH. Systolic function was normal.  The estimated ejection fraction was in the range of 60% to 65%.  The study is not technically sufficient to allow evaluation of LV  diastolic function.   Laboratory Data:  High Sensitivity Troponin:   Recent Labs  Lab 03/31/20 1759 03/31/20 2028  TROPONINIHS 16 17     Chemistry Recent Labs  Lab 03/31/20 1759  NA 142  K 4.0  CL 106  CO2 21*  GLUCOSE 150*    BUN 23  CREATININE 0.69  CALCIUM 9.3  GFRNONAA >60  GFRAA >60  ANIONGAP 15    No results for input(s): PROT, ALBUMIN, AST, ALT, ALKPHOS, BILITOT in the last 168 hours. Hematology Recent Labs  Lab 03/31/20 1759  WBC 5.8  RBC 4.81  HGB 13.7  HCT 43.8  MCV 91.1  MCH 28.5  MCHC 31.3  RDW 13.9  PLT 228   BNP Recent Labs  Lab 04/01/20 0924  BNP 180.8*    DDimer No results for input(s): DDIMER in the last 168 hours.   Radiology/Studies:  DG Chest 2 View  Result Date: 03/31/2020 CLINICAL DATA:  Shortness of breath x2 weeks. EXAM: CHEST - 2 VIEW COMPARISON:  March 31, 2020 (3:41 p.m.) FINDINGS: Mild, diffuse, chronic appearing increased interstitial lung markings are noted. Predominant stable mild to moderate severity areas of atelectasis and/or infiltrate are seen within the bilateral lung bases, right greater than left. Small bilateral pleural effusions are seen. No pneumothorax is identified. The heart size and mediastinal contours are within normal limits. The visualized skeletal structures are unremarkable. IMPRESSION: 1. Mild to moderate severity areas of atelectasis and/or infiltrate within the bilateral lung bases, right greater than left. 2. Small bilateral pleural effusions. Electronically Signed   By: Virgina Norfolk M.D.   On: 03/31/2020 17:38   CT Angio Chest PE W and/or Wo Contrast  Result Date: 04/01/2020 CLINICAL DATA:  Shortness of breath with tachycardia EXAM: CT ANGIOGRAPHY CHEST WITH CONTRAST TECHNIQUE: Multidetector CT imaging of the chest was performed using the standard protocol during bolus administration of intravenous contrast. Multiplanar CT image reconstructions and MIPs were obtained to evaluate the vascular anatomy. CONTRAST:  9mL OMNIPAQUE IOHEXOL 350 MG/ML SOLN COMPARISON:  Chest radiograph March 31, 2020. Right breast mammogram and right breast ultrasound February 11, 2020 FINDINGS: Cardiovascular: There is no demonstrable pulmonary embolus. There is no  thoracic aortic aneurysm. No dissection seen. Note that the contrast bolus  in the aorta is not sufficient for potential thoracic aortic dissection assessment. The visualized great vessels appear unremarkable. There is aortic atherosclerosis. There are foci of coronary artery calcification. There is no pericardial effusion or pericardial thickening. Heart is enlarged. Mediastinum/Nodes: Thyroid is slightly inhomogeneous without well-defined focal mass. There are subcentimeter mediastinal lymph nodes. There is no frank adenopathy by size criteria in the thoracic region. There is a small hiatal hernia. Lungs/Pleura: There is underlying centrilobular emphysematous change. There are moderate free-flowing pleural effusions bilaterally. There is atelectatic change in both lower lung regions with patchy superimposed airspace opacity concerning for a degree of pneumonia in the lower lung regions as well. Elsewhere, there are areas of somewhat mosaic attenuation. Upper Abdomen: There is upper abdominal aortic atherosclerosis. There is slight reflux of contrast into the inferior vena cava and hepatic veins. Visualized upper abdominal structures otherwise appear normal. Musculoskeletal: There are foci of degenerative change in the thoracic spine. No blastic or lytic bone lesions. No chest wall lesions are evident. There is a focal area of asymmetric opacity in the upper outer quadrant of the right breast containing a small calcification. This opacity measures 2.4 x 1.8 cm. Patient has had recent mammography and ultrasound of the right breast. Review of the MIP images confirms the above findings. IMPRESSION: 1. No evident pulmonary embolus. No thoracic aortic aneurysm. No dissection evident. Note that the contrast bolus in the aorta is not sufficient for confident dissection assessment. There is aortic atherosclerosis as well as foci of coronary artery calcification. There is cardiomegaly. 2. Free-flowing pleural effusions  bilaterally with compressive atelectasis in both lower lung zones. There may well be associated pneumonia in these areas, particularly on the right. 3. Areas of patchy ill-defined mosaic attenuation may represent a degree of underlying pulmonary edema. Given the cardiomegaly and pleural effusions as well as this appearance, a degree of congestive heart failure is question. There may also be a degree of underlying small airways obstructive disease. 4. Slight reflux of contrast into the inferior vena cava and hepatic veins may be indicative of a degree of increase in right heart pressure. 5. There is a degree of underlying centrilobular emphysematous change. 6.  No adenopathy evident. 7.  Small hiatal hernia. Aortic Atherosclerosis (ICD10-I70.0) and Emphysema (ICD10-J43.9). Electronically Signed   By: Lowella Grip III M.D.   On: 04/01/2020 08:34    Assessment and Plan:   A. Fib/flutter with RVR -Diagnosed back in 2018 and started on Eliquis 5 mg twice daily. Echo at that time showed preserved EF -Patient presents with shortness of breath, lower leg edema, palpitations for the last 2 weeks found to be in A. fib RVR.  Troponin negative x2.  Imaging showing small bilateral pleural effusions possible infection.  Patient was started on IV diltiazem. - patient missed a dose of Eliquis yesterday - CHADSVASC = 4 (HTN, female x 2, age, DM2) - TSH normal. Mag 2, K+4 - Rates are still elevated despite IV dilt. Patient is asymptomatic.  - F/u echo pending>>unofficial read by Dr. Audie Box: EF 30%. Stop dilt. Start metoprolol for rate control. Likely needs cardioversion. Will need TEE before. Plan to diuresis over the weekend and cardiovert Monday.   Volume overload - in the setting of above. -Admission BNP 180.  Chest x-ray with possible pleural effusions vs infiltrates. WBC normal and pt is afebrile. Procal ordered -Started on IV Lasix 40 mg twice daily>>will increase -Echo in 2018 showed preserved EF. Repeat  echo showing LV dysfunction -Daily weights and  strict I's and O's -Creatinine stable  Hypertension - pta lisinopril 20 mg daily - IV diltiazem>will stop  Hyperlipidemia - pts atorvastatin 80 mg -Lipid panel tomorrow AM - pressures reasonable  Diabetes type 2 -SSI -Per primary team   For questions or updates, please contact [facility] Please consult [email or web address] for contact info under    Signed, Kymberlie Brazeau Ninfa Meeker, PA-C  04/01/2020 11:26 AM

## 2020-04-01 NOTE — ED Provider Notes (Addendum)
[facility] EMERGENCY DEPARTMENT Provider Note   CSN: [id number] Arrival date & time: 03/31/20  1705     History Chief Complaint  Patient presents with  . Atrial Fibrillation    Melissa Bishop is a 74 y.o. female.  The history is provided by the patient. The history is limited by the condition of the patient.  Shortness of Breath Severity:  Moderate Onset quality:  Gradual Duration:  2 weeks Timing:  Constant Progression:  Worsening Chronicity:  New Context comment:  Cough Relieved by:  Nothing Worsened by:  Nothing Ineffective treatments:  None tried Associated symptoms: cough   Associated symptoms: no abdominal pain, no chest pain, no diaphoresis, no fever, no rash and no wheezing   Risk factors: no recent alcohol use   Patient presents with 2 weeks of SOB and was seen by PMD yesterday and found to be in AFIB with RVR and had an abnormal chest Xray.  Was sent to the ED.  Patient is unsure when she went into AFIB.       Past Medical History:  Diagnosis Date  . Diabetes mellitus without complication ([place])   . High cholesterol   . Hypertension     Patient Active Problem List   Diagnosis Date Noted  . Atrial fibrillation with rapid ventricular response ([place]) 04/01/2020    Past Surgical History:  Procedure Laterality Date  . BREAST EXCISIONAL BIOPSY Right ? more than 30 years  . BREAST SURGERY       OB History   No obstetric history on file.     History reviewed. No pertinent family history.  Social History   Tobacco Use  . Smoking status: Never Smoker  . Smokeless tobacco: Never Used  Substance Use Topics  . Alcohol use: No  . Drug use: No    Home Medications Prior to Admission medications   Medication Sig Start Date End Date Taking? Authorizing Provider  apixaban (ELIQUIS) 5 MG TABS tablet Take 5 mg by mouth 2 (two) times daily.    [provider]  atorvastatin (LIPITOR) 40 MG tablet Take 40 mg by mouth daily.     [provider]  docusate sodium (COLACE) 250 MG capsule Take 250 mg by mouth daily as needed for constipation.    [provider]  Fluocinolone Acetonide 0.01 % OIL  03/25/19   [provider]  HYDROcodone-acetaminophen (NORCO) 5-325 MG tablet Take 1 tablet by mouth every 6 (six) hours as needed for severe pain. 11/20/18   Julianne Rice, MD  lisinopril (PRINIVIL,ZESTRIL) 20 MG tablet Take 20 mg by mouth daily.     [provider]  metFORMIN (GLUCOPHAGE) 500 MG tablet Take 500 mg by mouth 2 (two) times daily with a meal.    [provider]  Multiple Vitamins-Minerals (CENTRUM SILVER ADULT 50+ PO) Take 1 tablet by mouth daily.    [provider]  Multiple Vitamins-Minerals (PRESERVISION AREDS 2) CAPS Take 1 capsule by mouth daily.     [provider]  nystatin cream (MYCOSTATIN)  03/24/19   [provider]  ondansetron (ZOFRAN) 4 MG tablet Take 1 tablet (4 mg total) by mouth every 6 (six) hours as needed for nausea or vomiting. 11/20/18   Julianne Rice, MD  [facility] injection  03/24/19   [provider]  tacrolimus (PROTOPIC) 0.1 % ointment  03/02/19   [provider]  Ustekinumab (STELARA) 45 MG/0.5ML SOLN Inject 45 mg into the skin every 3 (three) months.  [provider]    Allergies    Patient has no known allergies.  Review of Systems   Review of Systems  Constitutional: Negative for diaphoresis and fever.  HENT: Negative for congestion.   Eyes: Negative for visual disturbance.  Respiratory: Positive for cough and shortness of breath. Negative for wheezing.   Cardiovascular: Negative for chest pain.  Gastrointestinal: Negative for abdominal pain.  Genitourinary: Negative for difficulty urinating.  Skin: Negative for rash.  Neurological: Negative for dizziness.  Psychiatric/Behavioral: Negative for agitation.  All other systems reviewed and are negative.   Physical Exam Updated  Vital Signs BP (!) 151/97   Pulse 75   Temp 98.9 F (37.2 C) (Oral)   Resp 17   Ht 5\' 11"  (1.803 m)   Wt 81.6 kg   SpO2 98%   BMI 25.09 kg/m   Physical Exam Vitals and nursing note reviewed.  Constitutional:      General: She is not in acute distress.    Appearance: Normal appearance.  HENT:     Head: Normocephalic and atraumatic.     Nose: Nose normal.  Eyes:     Conjunctiva/sclera: Conjunctivae normal.     Pupils: Pupils are equal, round, and reactive to light.  Cardiovascular:     Rate and Rhythm: Tachycardia present. Rhythm irregular.     Pulses: Normal pulses.     Heart sounds: Normal heart sounds.  Pulmonary:     Effort: Pulmonary effort is normal.     Breath sounds: Normal breath sounds.  Abdominal:     General: Abdomen is flat. Bowel sounds are normal.     Tenderness: There is no abdominal tenderness. There is no guarding or rebound.  Musculoskeletal:        General: No tenderness.     Cervical back: Normal range of motion and neck supple.  Skin:    General: Skin is warm and dry.     Capillary Refill: Capillary refill takes less than 2 seconds.  Neurological:     General: No focal deficit present.     Mental Status: She is alert.     Deep Tendon Reflexes: Reflexes normal.  Psychiatric:        Mood and Affect: Mood normal.        Behavior: Behavior normal.     ED Results / Procedures / Treatments   Labs (all labs ordered are listed, but only abnormal results are displayed) Results for orders placed or performed during the hospital encounter of 56/38/75  Basic metabolic panel  Result Value Ref Range   Sodium 142 135 - 145 mmol/L   Potassium 4.0 3.5 - 5.1 mmol/L   Chloride 106 98 - 111 mmol/L   CO2 21 (L) 22 - 32 mmol/L   Glucose, Bld 150 (H) 70 - 99 mg/dL   BUN 23 8 - 23 mg/dL   Creatinine, Ser 0.69 0.44 - 1.00 mg/dL   Calcium 9.3 8.9 - 10.3 mg/dL   GFR calc non Af Amer >60 >60 mL/min   GFR calc Af Amer >60 >60 mL/min   Anion gap 15 5 - 15  CBC    Result Value Ref Range   WBC 5.8 4.0 - 10.5 K/uL   RBC 4.81 3.87 - 5.11 MIL/uL   Hemoglobin 13.7 12.0 - 15.0 g/dL   HCT 43.8 36 - 46 %   MCV 91.1 80.0 - 100.0 fL   MCH 28.5 26.0 - 34.0 pg   MCHC 31.3 30.0 - 36.0 g/dL  RDW 13.9 11.5 - 15.5 %   Platelets 228 150 - 400 K/uL   nRBC 0.0 0.0 - 0.2 %  Troponin I (High Sensitivity)  Result Value Ref Range   Troponin I (High Sensitivity) 16 <18 ng/L  Troponin I (High Sensitivity)  Result Value Ref Range   Troponin I (High Sensitivity) 17 <18 ng/L   DG Chest 2 View  Result Date: 03/31/2020 CLINICAL DATA:  Shortness of breath x2 weeks. EXAM: CHEST - 2 VIEW COMPARISON:  March 31, 2020 (3:41 p.m.) FINDINGS: Mild, diffuse, chronic appearing increased interstitial lung markings are noted. Predominant stable mild to moderate severity areas of atelectasis and/or infiltrate are seen within the bilateral lung bases, right greater than left. Small bilateral pleural effusions are seen. No pneumothorax is identified. The heart size and mediastinal contours are within normal limits. The visualized skeletal structures are unremarkable. IMPRESSION: 1. Mild to moderate severity areas of atelectasis and/or infiltrate within the bilateral lung bases, right greater than left. 2. Small bilateral pleural effusions. Electronically Signed   By: Virgina Norfolk M.D.   On: 03/31/2020 17:38    EKG EKG Interpretation  Date/Time:  Thursday March 31 2020 17:41:30 EDT Ventricular Rate:  165 PR Interval:    QRS Duration: 124 QT Interval:  300 QTC Calculation: 497 R Axis:   -27 Text Interpretation: Atrial fibrillation WITH RVR Non-specific intra-ventricular conduction delay Minimal voltage criteria for LVH, may be normal variant ( Cornell product ) ST & T wave abnormality, consider lateral ischemia Abnormal ECG Reconfirmed by Addison Lank [phone]) on 04/01/2020 12:04:27 AM   Radiology DG Chest 2 View  Result Date: 03/31/2020 CLINICAL DATA:  Shortness of breath  x2 weeks. EXAM: CHEST - 2 VIEW COMPARISON:  March 31, 2020 (3:41 p.m.) FINDINGS: Mild, diffuse, chronic appearing increased interstitial lung markings are noted. Predominant stable mild to moderate severity areas of atelectasis and/or infiltrate are seen within the bilateral lung bases, right greater than left. Small bilateral pleural effusions are seen. No pneumothorax is identified. The heart size and mediastinal contours are within normal limits. The visualized skeletal structures are unremarkable. IMPRESSION: 1. Mild to moderate severity areas of atelectasis and/or infiltrate within the bilateral lung bases, right greater than left. 2. Small bilateral pleural effusions. Electronically Signed   By: Virgina Norfolk M.D.   On: 03/31/2020 17:38    Procedures Procedures (including critical care time)  Medications Ordered in ED Medications  diltiazem (CARDIZEM) 1 mg/mL load via infusion 10 mg (has no administration in time range)    And  diltiazem (CARDIZEM) 125 mg in dextrose 5% 125 mL (1 mg/mL) infusion (has no administration in time range)    ED Course  I have reviewed the triage vital signs and the nursing notes.  Pertinent labs & imaging results that were available during my care of the patient were reviewed by me and considered in my medical decision making (see chart for details).    MDM Reviewed: previous chart, nursing note and vitals Interpretation: labs, x-ray and ECG (no covid on CXR.  normal troponin) Total time providing critical care: 30-74 minutes (diltiazem bolus and drip ordered immediately by me ). This excludes time spent performing separately reportable procedures and services. Consults: admitting MD  CRITICAL CARE Performed by: Jerie Basford K Kathyrn Warmuth-Rasch Total critical care time: 60 minutes Critical care time was exclusive of separately billable procedures and treating other patients. Critical care was necessary to treat or prevent imminent or life-threatening  deterioration. Critical care was time spent personally by me  on the following activities: development of treatment plan with patient and/or surrogate as well as nursing, discussions with consultants, evaluation of patient's response to treatment, examination of patient, obtaining history from patient or surrogate, ordering and performing treatments and interventions, ordering and review of laboratory studies, ordering and review of radiographic studies, pulse oximetry and re-evaluation of patient's condition.  Final Clinical Impression(s) / ED Diagnoses Final diagnoses:  Atrial fibrillation with RVR ([place])    Admit to medicine, case d/w Dr. Joelene Millin, Sevin Langenbach, MD 04/01/20 9833    Veatrice Kells, MD 04/01/20 8250

## 2020-04-02 DIAGNOSIS — I5021 Acute systolic (congestive) heart failure: Secondary | ICD-10-CM

## 2020-04-02 LAB — BASIC METABOLIC PANEL
Anion gap: 10 (ref 5–15)
BUN: 20 mg/dL (ref 8–23)
CO2: 28 mmol/L (ref 22–32)
Calcium: 9.4 mg/dL (ref 8.9–10.3)
Chloride: 103 mmol/L (ref 98–111)
Creatinine, Ser: 0.77 mg/dL (ref 0.44–1.00)
GFR calc Af Amer: >60 mL/min (ref >60)
GFR calc non Af Amer: >60 mL/min (ref >60)
Glucose, Bld: 156 mg/dL — ABNORMAL HIGH (ref 70–99)
Potassium: 3.3 mmol/L — ABNORMAL LOW (ref 3.5–5.1)
Sodium: 141 mmol/L (ref 135–145)

## 2020-04-02 LAB — GLUCOSE, CAPILLARY
Glucose-Capillary: 159 mg/dL — ABNORMAL HIGH (ref 70–99)
Glucose-Capillary: 157 mg/dL — ABNORMAL HIGH (ref 70–99)
Glucose-Capillary: 182 mg/dL — ABNORMAL HIGH (ref 70–99)
Glucose-Capillary: 225 mg/dL — ABNORMAL HIGH (ref 70–99)

## 2020-04-02 LAB — LIPID PANEL
Cholesterol: 200 mg/dL (ref 0–200)
HDL: 37 mg/dL — ABNORMAL LOW (ref >40)
LDL Cholesterol: 142 mg/dL — ABNORMAL HIGH (ref 0–99)
Total CHOL/HDL Ratio: 5.4 RATIO
Triglycerides: 104 mg/dL (ref <150)
VLDL: 21 mg/dL (ref 0–40)

## 2020-04-02 LAB — CBC
HCT: 42.9 % (ref 36.0–46.0)
Hemoglobin: 13.9 g/dL (ref 12.0–15.0)
MCH: 28.8 pg (ref 26.0–34.0)
MCHC: 32.4 g/dL (ref 30.0–36.0)
MCV: 89 fL (ref 80.0–100.0)
Platelets: 214 10*3/uL (ref 150–400)
RBC: 4.82 MIL/uL (ref 3.87–5.11)
RDW: 13.7 % (ref 11.5–15.5)
WBC: 7 10*3/uL (ref 4.0–10.5)
nRBC: 0 % (ref 0.0–0.2)

## 2020-04-02 MED ORDER — METOPROLOL TARTRATE 5 MG/5ML IV SOLN
5.0000 mg | INTRAVENOUS | Status: DC | PRN
  Filled 2020-04-02: qty 5

## 2020-04-02 MED ORDER — POTASSIUM CHLORIDE CRYS ER 20 MEQ PO TBCR
40.0000 meq | EXTENDED_RELEASE_TABLET | ORAL | Status: AC
  Administered 2020-04-02 (×2): 40 meq via ORAL
  Filled 2020-04-02 (×2): qty 2

## 2020-04-02 MED ORDER — AMIODARONE HCL IN DEXTROSE 360-4.14 MG/200ML-% IV SOLN
30.0000 mg/h | INTRAVENOUS | Status: DC
  Administered 2020-04-02 – 2020-04-04 (×4): 30 mg/h via INTRAVENOUS
  Filled 2020-04-02 (×3): qty 200

## 2020-04-02 MED ORDER — AMIODARONE LOAD VIA INFUSION
150.0000 mg | Freq: Once | INTRAVENOUS | Status: AC
  Administered 2020-04-02: 150 mg via INTRAVENOUS
  Filled 2020-04-02: qty 83.34

## 2020-04-02 MED ORDER — AMIODARONE HCL IN DEXTROSE 360-4.14 MG/200ML-% IV SOLN
60.0000 mg/h | INTRAVENOUS | Status: DC
  Administered 2020-04-02 (×2): 60 mg/h via INTRAVENOUS
  Filled 2020-04-02: qty 200

## 2020-04-02 NOTE — Progress Notes (Signed)
Progress Note  Patient Name: Melissa Bishop Date of Encounter: 04/02/2020  [facility] Cardiologist: Dr Audie Box  Subjective   The patient feels short of breath this morning, sitting in chair using accessory muscles.  Oxygen saturation 93%.  Inpatient Medications    Scheduled Meds: . apixaban  5 mg Oral BID  . atorvastatin  80 mg Oral Daily  . furosemide  80 mg Intravenous BID  . insulin aspart  0-15 Units Subcutaneous TID WC  . insulin aspart  0-5 Units Subcutaneous QHS  . lisinopril  40 mg Oral Daily  . metoprolol tartrate  25 mg Oral Q6H  . sodium chloride flush  3 mL Intravenous Q12H  . spironolactone  12.5 mg Oral Daily   Continuous Infusions: . sodium chloride     PRN Meds: sodium chloride, acetaminophen, metoprolol tartrate, ondansetron (ZOFRAN) IV, sodium chloride flush   Vital Signs    Vitals:   04/02/20 0038 04/02/20 0500 04/02/20 0535 04/02/20 0835  BP: (!) 112/91 115/79 (!) 114/59   Pulse: (!) 144 (!) 141 (!) 143   Resp: 20 18 19    Temp: 98.1 F (36.7 C)  97.8 F (36.6 C) 98.7 F (37.1 C)  TempSrc: Oral  Oral Oral  SpO2: 93% 94% 97%   Weight:   101.8 kg   Height:        Intake/Output Summary (Last 24 hours) at 04/02/2020 1034 Last data filed at 04/01/2020 2330 Gross per 24 hour  Intake 240 ml  Output 1925 ml  Net -1685 ml   Last 3 Weights 04/02/2020 04/01/2020 03/31/2020  Weight (lbs) 224 lb 8 oz 229 lb 3.2 oz 179 lb 14.3 oz  Weight (kg) 101.833 kg 103.964 kg 81.6 kg      Telemetry    Atrial fibrillation with ventricular rates in 130s to 140s- Personally Reviewed  ECG    No new tracing- Personally Reviewed  Physical Exam  Mild respiratory distress oxygen saturation 93% Neck: No JVD when sitting upright Cardiac:  Irregular, no murmurs, rubs, or gallops.  Respiratory:  Crackles bilaterally. GI: Soft, nontender, non-distended  MS:  2+ bilateral edema; No deformity. Neuro:  Nonfocal  Psych: Normal affect   Labs    High Sensitivity  Troponin:   Recent Labs  Lab 03/31/20 1759 03/31/20 2028  TROPONINIHS 16 17     Chemistry Recent Labs  Lab 03/31/20 1759 04/02/20 0231  NA 142 141  K 4.0 3.3*  CL 106 103  CO2 21* 28  GLUCOSE 150* 156*  BUN 23 20  CREATININE 0.69 0.77  CALCIUM 9.3 9.4  GFRNONAA >60 >60  GFRAA >60 >60  ANIONGAP 15 10     Hematology Recent Labs  Lab 03/31/20 1759 04/02/20 0231  WBC 5.8 7.0  RBC 4.81 4.82  HGB 13.7 13.9  HCT 43.8 42.9  MCV 91.1 89.0  MCH 28.5 28.8  MCHC 31.3 32.4  RDW 13.9 13.7  PLT 228 214    BNP Recent Labs  Lab 04/01/20 0924  BNP 180.8*     DDimer No results for input(s): DDIMER in the last 168 hours.   Cardiac Studies   TTE: 04/01/2020  1. Left ventricular ejection fraction, by estimation, is 25%. The left  ventricle has severely decreased function. Global hypokinesis. Left  ventricular endocardial border not optimally defined to evaluate regional  wall motion despite the use of Definity  contrast. Left ventricular diastolic parameters are indeterminate.  2. Right ventricular systolic function is severely reduced. The right  ventricular size  is mildly enlarged. There is mildly elevated pulmonary  artery systolic pressure. The estimated right ventricular systolic  pressure is 56.3 mmHg.  3. Left atrial size was mildly dilated.  4. Right atrial size was mildly dilated.  5. The mitral valve is grossly normal. No evidence of mitral valve  regurgitation.  6. The aortic valve is grossly normal. Aortic valve regurgitation is not  visualized.  7. The inferior vena cava is dilated in size with <50% respiratory  variability, suggesting right atrial pressure of 15 mmHg.    Patient Profile     74 y.o. female with history of diabetes, psoriasis, hypertension, atrial fibrillation in the past on Eliquis who was admitted on 04/01/2020 for acute decompensated systolic heart failure and atrial fibrillation with RVR.  Assessment & Plan    1. New onset  systolic heart failure, ejection fraction 25-30%  -She is volume overloaded.  She diuresed 1.7 L overnight, her creatinine is stable, however she is hypotensive, we will hold all of her blood pressure medications but give her IV Lasix to continue diuresis. -Could consider switch to [facility], but more logically we should probably get her normal rhythm and see what her heart function does.  2.  Atrial flutter with RVR -She merits anticoagulation (chads vasc = at least 4) -Her Cardizem was stopped, she is tachycardic and hypotensive, I will start amiodarone bolus and drip  -I will add IV digoxin -She did miss a dose of Eliquis on 819.  If she had not missed this dose we could cardiovert her however, she will need to wait until she is back on several doses and we can plan for TEE/cardioversion on Monday.  Also, she is volume overloaded and I think we should get fluid off before we proceed with cardioversion.  This will help her maintain sinus rhythm.  Alternatively, she could also be considered for a flutter ablation.  For questions or updates, please contact [place] Please consult [email or web address] for contact info under     Signed, Ena Dawley, MD  04/02/2020, 10:34 AM

## 2020-04-02 NOTE — Progress Notes (Signed)
PROGRESS NOTE    Melissa Bishop  ZOX:[id number] DOB: [birth year] DOA: 03/31/2020 PCP: [name], MD    Brief Narrative:  Patient was admitted to the hospital with a working diagnosis of atrial fibrillation with rapid ventricular response, complicated with acute diastolic heart failure exacerbation..  74 year old female with past medical history for hypertension, dyslipidemia, psoriasis and type 2 diabetes mellitus who presented with progressive worsening dyspnea over the last 2 weeks.  Symptoms associated with nonproductive cough, lower extremity edema, intermittent palpitations and constipation.  She will physical examination her heart rate was 160 bpm, respiratory rate 29, blood pressure 116/85, oxygen saturation 95%.  Her lungs had Rales bilaterally, heart S1-S2, irregularly irregular, abdomen soft, +2+ pitting bilateral extremity edema. Sodium 142, potassium 4.0, chloride 106, bicarb 21, glucose 150, BUN 23, creatinine 0.695.8, hemoglobin 13.7, hematocrit 43.8, platelets 228, SARS COVID-19 negative. Chest radiograph with increased vascular congestion, small bilateral pleural effusions.  EKG 165 bpm, left axis deviation, left bundle branch block, atrial fibrillation rhythm, V4-V6 ST depressions, no significant T wave changes. CT chest negative for pulmonary embolism, positive bilateral pleural effusions and groundglass opacities.   Assessment & Plan:   Principal Problem:   Atrial fibrillation with rapid ventricular response (HCC) Active Problems:   Psoriasis   Constipation   Essential hypertension   Diabetes mellitus type II, non insulin dependent (HCC)   Dyspnea   1. Atrial fibrillation with rapid ventricular response, complicated with acute on chronic systolic heart failure. Patient with persistent rapid ventricular response with HR 811'B and systolic blood pressure 90 to 100's. She is awake and alert. Continue to have bilateral lower extremity edema. Echocardiogram with LV  systolic function 14%, with global hypokinesis.   Patient on metoprolol 25 mg q6 H, will add IV metoprolol 5 mg as needed. Due to decreased LV systolic function diltiazem not recommended. Patient will likely need amiodarone infusion and/ or digoxin. Will follow with cardiology recommendations.   Continue anticoagulation with apixaban.    2. Acute on chronic systolic heart failure exacerbation/ Ef 25% with global hypokinesis. Urine out put since admission 1.925 ml, patient continue clinically hypervolemic.   Continue diuresis with furosemide 80 mg IV q12h, to target further negative fluid balance.  Patient on metoprolol and lisinopril (40 mg), will add holding parameters to prevent hypotension.  Continue with spironolactone.   3. Uncontrolled T2DM Hgb A1c 7.5/ dyslipidemia. fasting glucose this am is 156 mg/dl, will continue insulin sliding scale for glucose cover and monitoring.   Continue with statin therapy.  4. Hypokalemia. Renal function with serum cr at 0,77, with K at 3,3 and serum bicarbonate 28.   Will add Kcl 40 meq kcl x2. Will follow with renal function in am.   5. Obesity class 1. Calculated BMI is 31,9. Will need outpatient follow up.   Patient continue to be at high risk for worsening atrial fibrillation and heart failure.   Status is: Inpatient  Remains inpatient appropriate because:IV treatments appropriate due to intensity of illness or inability to take PO   Dispo: The patient is from: Home              Anticipated d/c is to: Home              Anticipated d/c date is: 3 days              Patient currently is not medically stable to d/c.  DVT prophylaxis: apixaban   Code Status:   full  Family Communication:  No family  at the bedside      Consultants:   Cardiology     Subjective: Patient continue to have dyspnea, no chest pain but positive palpitation, positive lower extremity edema and PND.   Objective: Vitals:   04/02/20 0038 04/02/20 0500  04/02/20 0535 04/02/20 0835  BP: (!) 112/91 115/79 (!) 114/59   Pulse: (!) 144 (!) 141 (!) 143   Resp: 20 18 19    Temp: 98.1 F (36.7 C)  97.8 F (36.6 C) 98.7 F (37.1 C)  TempSrc: Oral  Oral Oral  SpO2: 93% 94% 97%   Weight:   101.8 kg   Height:        Intake/Output Summary (Last 24 hours) at 04/02/2020 0855 Last data filed at 04/01/2020 2330 Gross per 24 hour  Intake 240 ml  Output 1925 ml  Net -1685 ml   Filed Weights   03/31/20 1706 04/01/20 1322 04/02/20 0535  Weight: 81.6 kg 104 kg 101.8 kg    Examination:   General: Not in pain, positive dyspnea at rest.  Neurology: Awake and alert, non focal  E ENT: no pallor, no icterus, oral mucosa moist Cardiovascular: Positive JVD. S1-S2 present, tachycardic. +++ pitting bilateral lower extremity edema. Pulmonary: positive breath sounds bilaterally. Gastrointestinal. Abdomen soft and non tender Skin. No rashes Musculoskeletal: no joint deformities     Data Reviewed: I have personally reviewed following labs and imaging studies  CBC: Recent Labs  Lab 03/31/20 1759 04/02/20 0231  WBC 5.8 7.0  HGB 13.7 13.9  HCT 43.8 42.9  MCV 91.1 89.0  PLT 228 814   Basic Metabolic Panel: Recent Labs  Lab 03/31/20 1759 04/01/20 0923 04/02/20 0231  NA 142  --  141  K 4.0  --  3.3*  CL 106  --  103  CO2 21*  --  28  GLUCOSE 150*  --  156*  BUN 23  --  20  CREATININE 0.69  --  0.77  CALCIUM 9.3  --  9.4  MG  --  2.0  --    GFR: Estimated Creatinine Clearance: 81 mL/min (by C-G formula based on SCr of 0.77 mg/dL). Liver Function Tests: No results for input(s): AST, ALT, ALKPHOS, BILITOT, PROT, ALBUMIN in the last 168 hours. No results for input(s): LIPASE, AMYLASE in the last 168 hours. No results for input(s): AMMONIA in the last 168 hours. Coagulation Profile: No results for input(s): INR, PROTIME in the last 168 hours. Cardiac Enzymes: No results for input(s): CKTOTAL, CKMB, CKMBINDEX, TROPONINI in the last 168  hours. BNP (last 3 results) No results for input(s): PROBNP in the last 8760 hours. HbA1C: Recent Labs    04/01/20 0923  HGBA1C 7.5*   CBG: Recent Labs  Lab 04/01/20 1319 04/01/20 1641 04/01/20 2109 04/02/20 0619  GLUCAP 178* 142* 174* 159*   Lipid Profile: Recent Labs    04/02/20 0231  CHOL 200  HDL 37*  LDLCALC 142*  TRIG 104  CHOLHDL 5.4   Thyroid Function Tests: Recent Labs    04/01/20 0923  TSH 1.866   Anemia Panel: No results for input(s): VITAMINB12, FOLATE, FERRITIN, TIBC, IRON, RETICCTPCT in the last 72 hours.    Radiology Studies: I have reviewed all of the imaging during this hospital visit personally     Scheduled Meds: . apixaban  5 mg Oral BID  . atorvastatin  80 mg Oral Daily  . furosemide  80 mg Intravenous BID  . insulin aspart  0-15 Units Subcutaneous TID WC  .  insulin aspart  0-5 Units Subcutaneous QHS  . lisinopril  40 mg Oral Daily  . metoprolol tartrate  25 mg Oral Q6H  . sodium chloride flush  3 mL Intravenous Q12H  . spironolactone  12.5 mg Oral Daily   Continuous Infusions: . sodium chloride       LOS: 1 day        Ta Fair Gerome Apley, MD

## 2020-04-03 DIAGNOSIS — R0600 Dyspnea, unspecified: Secondary | ICD-10-CM

## 2020-04-03 DIAGNOSIS — I10 Essential (primary) hypertension: Secondary | ICD-10-CM

## 2020-04-03 LAB — BASIC METABOLIC PANEL
Anion gap: 12 (ref 5–15)
BUN: 25 mg/dL — ABNORMAL HIGH (ref 8–23)
CO2: 27 mmol/L (ref 22–32)
Calcium: 9.3 mg/dL (ref 8.9–10.3)
Chloride: 103 mmol/L (ref 98–111)
Creatinine, Ser: 0.93 mg/dL (ref 0.44–1.00)
GFR calc Af Amer: >60 mL/min (ref >60)
GFR calc non Af Amer: >60 mL/min (ref >60)
Glucose, Bld: 144 mg/dL — ABNORMAL HIGH (ref 70–99)
Potassium: 3.5 mmol/L (ref 3.5–5.1)
Sodium: 142 mmol/L (ref 135–145)

## 2020-04-03 LAB — GLUCOSE, CAPILLARY
Glucose-Capillary: 157 mg/dL — ABNORMAL HIGH (ref 70–99)
Glucose-Capillary: 181 mg/dL — ABNORMAL HIGH (ref 70–99)
Glucose-Capillary: 126 mg/dL — ABNORMAL HIGH (ref 70–99)
Glucose-Capillary: 173 mg/dL — ABNORMAL HIGH (ref 70–99)

## 2020-04-03 MED ORDER — DIGOXIN 125 MCG PO TABS
0.1250 mg | ORAL_TABLET | Freq: Every day | ORAL | Status: DC
  Administered 2020-04-03: 0.125 mg via ORAL
  Filled 2020-04-03 (×2): qty 1

## 2020-04-03 MED ORDER — LISINOPRIL 10 MG PO TABS
10.0000 mg | ORAL_TABLET | Freq: Every day | ORAL | Status: DC
  Filled 2020-04-03 (×2): qty 1

## 2020-04-03 MED ORDER — LACTATED RINGERS IV BOLUS
500.0000 mL | Freq: Once | INTRAVENOUS | Status: AC
  Administered 2020-04-04: 500 mL via INTRAVENOUS

## 2020-04-03 MED ORDER — POTASSIUM CHLORIDE CRYS ER 20 MEQ PO TBCR
40.0000 meq | EXTENDED_RELEASE_TABLET | Freq: Every day | ORAL | Status: DC
  Administered 2020-04-03 – 2020-04-05 (×3): 40 meq via ORAL
  Filled 2020-04-03 (×3): qty 2

## 2020-04-03 NOTE — Progress Notes (Signed)
Progress Note  Patient Name: Melissa Bishop Date of Encounter: 04/03/2020  [facility] Cardiologist: Dr Audie Box  Subjective   The patient is confused today, asking for her mother, denies palpitation, feels slightly short of breath.  Inpatient Medications    Scheduled Meds: . apixaban  5 mg Oral BID  . atorvastatin  80 mg Oral Daily  . furosemide  80 mg Intravenous BID  . insulin aspart  0-15 Units Subcutaneous TID WC  . insulin aspart  0-5 Units Subcutaneous QHS  . lisinopril  40 mg Oral Daily  . metoprolol tartrate  25 mg Oral Q6H  . sodium chloride flush  3 mL Intravenous Q12H  . spironolactone  12.5 mg Oral Daily   Continuous Infusions: . sodium chloride    . amiodarone 30 mg/hr (04/03/20 0752)   PRN Meds: sodium chloride, acetaminophen, metoprolol tartrate, ondansetron (ZOFRAN) IV, sodium chloride flush   Vital Signs    Vitals:   04/03/20 0035 04/03/20 0300 04/03/20 0800 04/03/20 0900  BP: 107/83 101/68 97/82 90/62   Pulse: (!) 136 (!) 135 (!) 138 (!) 139  Resp: 14 16 18    Temp: 98 F (36.7 C) 97.8 F (36.6 C)    TempSrc: Oral     SpO2: 96% 94% 96%   Weight:      Height:        Intake/Output Summary (Last 24 hours) at 04/03/2020 0957 Last data filed at 04/03/2020 0700 Gross per 24 hour  Intake 1127.2 ml  Output 450 ml  Net 677.2 ml   Last 3 Weights 04/02/2020 04/01/2020 03/31/2020  Weight (lbs) 224 lb 8 oz 229 lb 3.2 oz 179 lb 14.3 oz  Weight (kg) 101.833 kg 103.964 kg 81.6 kg      Telemetry    Atrial fibrillation with ventricular rates in 130s to 140s- Personally Reviewed  ECG    No new tracing- Personally Reviewed  Physical Exam  Not in acute distress Neck: No JVD when sitting upright Cardiac:  Irregular, no murmurs, rubs, or gallops.  Respiratory:  Mild crackles bilaterally. GI: Soft, nontender, non-distended  MS:  2+ bilateral edema; No deformity. Neuro:  Nonfocal  Psych: Normal affect   Labs    High Sensitivity Troponin:   Recent  Labs  Lab 03/31/20 1759 03/31/20 2028  TROPONINIHS 16 17     Chemistry Recent Labs  Lab 03/31/20 1759 04/02/20 0231 04/03/20 0548  NA 142 141 142  K 4.0 3.3* 3.5  CL 106 103 103  CO2 21* 28 27  GLUCOSE 150* 156* 144*  BUN 23 20 25*  CREATININE 0.69 0.77 0.93  CALCIUM 9.3 9.4 9.3  GFRNONAA >60 >60 >60  GFRAA >60 >60 >60  ANIONGAP 15 10 12      Hematology Recent Labs  Lab 03/31/20 1759 04/02/20 0231  WBC 5.8 7.0  RBC 4.81 4.82  HGB 13.7 13.9  HCT 43.8 42.9  MCV 91.1 89.0  MCH 28.5 28.8  MCHC 31.3 32.4  RDW 13.9 13.7  PLT 228 214    BNP Recent Labs  Lab 04/01/20 0924  BNP 180.8*     DDimer No results for input(s): DDIMER in the last 168 hours.   Cardiac Studies   TTE: 04/01/2020  1. Left ventricular ejection fraction, by estimation, is 25%. The left  ventricle has severely decreased function. Global hypokinesis. Left  ventricular endocardial border not optimally defined to evaluate regional  wall motion despite the use of Definity  contrast. Left ventricular diastolic parameters are indeterminate.  2.  Right ventricular systolic function is severely reduced. The right  ventricular size is mildly enlarged. There is mildly elevated pulmonary  artery systolic pressure. The estimated right ventricular systolic  pressure is 09.8 mmHg.  3. Left atrial size was mildly dilated.  4. Right atrial size was mildly dilated.  5. The mitral valve is grossly normal. No evidence of mitral valve  regurgitation.  6. The aortic valve is grossly normal. Aortic valve regurgitation is not  visualized.  7. The inferior vena cava is dilated in size with <50% respiratory  variability, suggesting right atrial pressure of 15 mmHg.    Patient Profile     74 y.o. female with history of diabetes, psoriasis, hypertension, atrial fibrillation in the past on Eliquis who was admitted on 04/01/2020 for acute decompensated systolic heart failure and atrial fibrillation with  RVR.  Assessment & Plan    1. New onset systolic heart failure, ejection fraction 25-30%  -She is volume overloaded.  Her I's and O's are not recorded properly, however her weight is down by 2 kg, she still has signs of fluid overload her creatinine and electrolytes are stable, I will replace potassium. -Could consider switch to [facility], but more logically we should probably get her normal rhythm and see what her heart function does.  Her blood pressure is low with atrial flutter with RVR I will decrease lisinopril to 10 mg daily.  2.  Atrial flutter with RVR -She merits anticoagulation (chads vasc = at least 4) -Her Cardizem was stopped, she is tachycardic and hypotensive, I will start amiodarone bolus and drip  -I will add IV digoxin -She remains in atrial flutter with 2-1 block, ventricular rate improved from yesterday from 1 60-140 BPM, I will continue loading with amiodarone drip. -She did miss a dose of Eliquis on 8/19.  If she had not missed this dose we could cardiovert her however, she will need to wait until she is back on several doses and we can plan for TEE/cardioversion on Monday.  Alternatively, she could also be considered for a flutter ablation.  For questions or updates, please contact [place] Please consult [email or web address] for contact info under     Signed, Ena Dawley, MD  04/03/2020, 9:57 AM

## 2020-04-03 NOTE — Progress Notes (Signed)
Called by RN who reported patient has a MEWS score in the red.  Called and discussed with nurse.  According to the chart patient has been with MEWS score of red since 8 AM this morning and this is not new.  MEWS scores elevated due to heart rate in the 09/02/1928 but patient is admitted with A. fib with RVR for low blood pressure which she has had intermittently throughout the day.  Was contacted and informed of this MEWS score which is based on vital signs are taken 2 hours ago.  Have asked nurse to get a new updated set of vital signs.  If blood pressure remains low we will give a small LR bolus.  Need to be careful with how much fluid to give due to her CHF and low EF of 25%.

## 2020-04-03 NOTE — Progress Notes (Addendum)
BP 90/62   Pulse (!) 139   Temp 97.8 F (36.6 C)   Resp 18   Ht 5\' 11"  (1.803 m)   Wt 101.8 kg   SpO2 96%   BMI 31.31 kg/m  Pt is a red mews  Asymptomatic  MD notified Will continue to monitor

## 2020-04-03 NOTE — H&P (View-Only) (Signed)
Progress Note  Patient Name: Melissa Bishop Date of Encounter: 04/03/2020  [facility] Cardiologist: Dr Audie Box  Subjective   The patient is confused today, asking for her mother, denies palpitation, feels slightly short of breath.  Inpatient Medications    Scheduled Meds: . apixaban  5 mg Oral BID  . atorvastatin  80 mg Oral Daily  . furosemide  80 mg Intravenous BID  . insulin aspart  0-15 Units Subcutaneous TID WC  . insulin aspart  0-5 Units Subcutaneous QHS  . lisinopril  40 mg Oral Daily  . metoprolol tartrate  25 mg Oral Q6H  . sodium chloride flush  3 mL Intravenous Q12H  . spironolactone  12.5 mg Oral Daily   Continuous Infusions: . sodium chloride    . amiodarone 30 mg/hr (04/03/20 0752)   PRN Meds: sodium chloride, acetaminophen, metoprolol tartrate, ondansetron (ZOFRAN) IV, sodium chloride flush   Vital Signs    Vitals:   04/03/20 0035 04/03/20 0300 04/03/20 0800 04/03/20 0900  BP: 107/83 101/68 97/82 90/62   Pulse: (!) 136 (!) 135 (!) 138 (!) 139  Resp: 14 16 18    Temp: 98 F (36.7 C) 97.8 F (36.6 C)    TempSrc: Oral     SpO2: 96% 94% 96%   Weight:      Height:        Intake/Output Summary (Last 24 hours) at 04/03/2020 0957 Last data filed at 04/03/2020 0700 Gross per 24 hour  Intake 1127.2 ml  Output 450 ml  Net 677.2 ml   Last 3 Weights 04/02/2020 04/01/2020 03/31/2020  Weight (lbs) 224 lb 8 oz 229 lb 3.2 oz 179 lb 14.3 oz  Weight (kg) 101.833 kg 103.964 kg 81.6 kg      Telemetry    Atrial fibrillation with ventricular rates in 130s to 140s- Personally Reviewed  ECG    No new tracing- Personally Reviewed  Physical Exam  Not in acute distress Neck: No JVD when sitting upright Cardiac:  Irregular, no murmurs, rubs, or gallops.  Respiratory:  Mild crackles bilaterally. GI: Soft, nontender, non-distended  MS:  2+ bilateral edema; No deformity. Neuro:  Nonfocal  Psych: Normal affect   Labs    High Sensitivity Troponin:   Recent  Labs  Lab 03/31/20 1759 03/31/20 2028  TROPONINIHS 16 17     Chemistry Recent Labs  Lab 03/31/20 1759 04/02/20 0231 04/03/20 0548  NA 142 141 142  K 4.0 3.3* 3.5  CL 106 103 103  CO2 21* 28 27  GLUCOSE 150* 156* 144*  BUN 23 20 25*  CREATININE 0.69 0.77 0.93  CALCIUM 9.3 9.4 9.3  GFRNONAA >60 >60 >60  GFRAA >60 >60 >60  ANIONGAP 15 10 12      Hematology Recent Labs  Lab 03/31/20 1759 04/02/20 0231  WBC 5.8 7.0  RBC 4.81 4.82  HGB 13.7 13.9  HCT 43.8 42.9  MCV 91.1 89.0  MCH 28.5 28.8  MCHC 31.3 32.4  RDW 13.9 13.7  PLT 228 214    BNP Recent Labs  Lab 04/01/20 0924  BNP 180.8*     DDimer No results for input(s): DDIMER in the last 168 hours.   Cardiac Studies   TTE: 04/01/2020  1. Left ventricular ejection fraction, by estimation, is 25%. The left  ventricle has severely decreased function. Global hypokinesis. Left  ventricular endocardial border not optimally defined to evaluate regional  wall motion despite the use of Definity  contrast. Left ventricular diastolic parameters are indeterminate.  2.  Right ventricular systolic function is severely reduced. The right  ventricular size is mildly enlarged. There is mildly elevated pulmonary  artery systolic pressure. The estimated right ventricular systolic  pressure is 38.7 mmHg.  3. Left atrial size was mildly dilated.  4. Right atrial size was mildly dilated.  5. The mitral valve is grossly normal. No evidence of mitral valve  regurgitation.  6. The aortic valve is grossly normal. Aortic valve regurgitation is not  visualized.  7. The inferior vena cava is dilated in size with <50% respiratory  variability, suggesting right atrial pressure of 15 mmHg.    Patient Profile     74 y.o. female with history of diabetes, psoriasis, hypertension, atrial fibrillation in the past on Eliquis who was admitted on 04/01/2020 for acute decompensated systolic heart failure and atrial fibrillation with  RVR.  Assessment & Plan    1. New onset systolic heart failure, ejection fraction 25-30%  -She is volume overloaded.  Her I's and O's are not recorded properly, however her weight is down by 2 kg, she still has signs of fluid overload her creatinine and electrolytes are stable, I will replace potassium. -Could consider switch to [facility], but more logically we should probably get her normal rhythm and see what her heart function does.  Her blood pressure is low with atrial flutter with RVR I will decrease lisinopril to 10 mg daily.  2.  Atrial flutter with RVR -She merits anticoagulation (chads vasc = at least 4) -Her Cardizem was stopped, she is tachycardic and hypotensive, I will start amiodarone bolus and drip  -I will add IV digoxin -She remains in atrial flutter with 2-1 block, ventricular rate improved from yesterday from 1 60-140 BPM, I will continue loading with amiodarone drip. -She did miss a dose of Eliquis on 8/19.  If she had not missed this dose we could cardiovert her however, she will need to wait until she is back on several doses and we can plan for TEE/cardioversion on Monday.  Alternatively, she could also be considered for a flutter ablation.  For questions or updates, please contact [place] Please consult [email or web address] for contact info under     Signed, Ena Dawley, MD  04/03/2020, 9:57 AM

## 2020-04-03 NOTE — Progress Notes (Signed)
PROGRESS NOTE    Melissa Bishop  ERX:[id number] DOB: 1946/02/14 DOA: 03/31/2020 PCP: [name], MD    Brief Narrative:  Patient was admitted to the hospital with a working diagnosis of atrial fibrillation with rapid ventricular response, complicated with acute systolic heart failure exacerbation..  74 year old female with past medical history for hypertension, dyslipidemia, psoriasis and type 2 diabetes mellitus who presented with progressive worsening dyspnea over the last 2 weeks.  Symptoms associated with nonproductive cough, lower extremity edema, intermittent palpitations and constipation.  On her initial physical examination her heart rate was 160 bpm, respiratory rate 29, blood pressure 116/85, oxygen saturation 95%.  Her lungs had rales bilaterally, heart S1-S2, irregularly irregular, abdomen soft, +2+ pitting bilateral extremity edema. Sodium 142, potassium 4.0, chloride 106, bicarb 21, glucose 150, BUN 23, creatinine 0.69, hemoglobin 13.7, hematocrit 43.8, platelets 228, SARS COVID-19 negative. Chest radiograph with increased vascular congestion, small bilateral pleural effusions.  EKG 165 bpm, left axis deviation, left bundle branch block, atrial fibrillation rhythm, V4-V6 ST depressions, no significant T wave changes. CT chest negative for pulmonary embolism, positive bilateral pleural effusions and groundglass opacities.  Patient continue to have rapid ventricular response despite AV blockade, she has been placed on amiodarone infusion.    Assessment & Plan:   Principal Problem:   Atrial fibrillation with rapid ventricular response (HCC) Active Problems:   Psoriasis   Constipation   Essential hypertension   Diabetes mellitus type II, non insulin dependent (HCC)   Dyspnea   Acute systolic heart failure ([place])    1. Atrial fibrillation/ flutter with rapid ventricular response, complicated with acute on chronic systolic heart failure. Echocardiogram with LV systolic  function 95%, with global hypokinesis.   Continue elevated HR in the 130 to 140's., on amiodarone infusion and metoprolol 25 mg q6 H. Added this am digoxin for better rate control. Continue anticoagulation with apixaban.      2. Acute on chronic systolic heart failure exacerbation/ Ef 25% with global hypokinesis. Urine out put over last 24 H is 1367 ml, clinically her volume has improved.   Tolerating well diuresis with furosemide and spironolactone. Continue b blockade with metoprolol and ace inh with lisinopril.    3. Uncontrolled T2DM Hgb A1c 7.5/ dyslipidemia. fasting glucose this am is 144 mg/dl, on insulin sliding scale for glucose cover and monitoring. Patient is tolerating po well.   On statin therapy.  4. Hypokalemia. This am renal function with serum cr at 0,93 with K at 3,5 and serum bicarbonate at 27.   Continue k correction with Kcl, and follow up renal function in am.   5. Obesity class 1. Calculated BMI is 31,9.    Patient continue to be at high risk for worsening atrial fibrillation/ flutter with heart failure/   Status is: Inpatient  Remains inpatient appropriate because:IV treatments appropriate due to intensity of illness or inability to take PO   Dispo: The patient is from: Home              Anticipated d/c is to: Home              Anticipated d/c date is: 2 days              Patient currently is not medically stable to d/c.   DVT prophylaxis: apixaban   Code Status:   full  Family Communication:  I spoke with patient's mother at the bedside, we talked in detail about patient's condition, plan of care and prognosis and all questions  were addressed.     Consultants:   Cardiology     Subjective: Patient with improved dyspnea but not yet back to baseline, continue to have lower extremity edema, no chest pain or palpitations.   Objective: Vitals:   04/03/20 0300 04/03/20 0800 04/03/20 0900 04/03/20 1107  BP: 101/68 97/82 90/62    Pulse: (!)  135 (!) 138 (!) 139   Resp: 16 18    Temp: 97.8 F (36.6 C)   97.6 F (36.4 C)  TempSrc:    Oral  SpO2: 94% 96%    Weight:      Height:        Intake/Output Summary (Last 24 hours) at 04/03/2020 1609 Last data filed at 04/03/2020 1300 Gross per 24 hour  Intake 1367.2 ml  Output 251 ml  Net 1116.2 ml   Filed Weights   03/31/20 1706 04/01/20 1322 04/02/20 0535  Weight: 81.6 kg 104 kg 101.8 kg    Examination:   General: Not in pain or dyspnea, deconditioned  Neurology: Awake and alert, non focal  E ENT: mild pallor, no icterus, oral mucosa moist Cardiovascular: mild JVD. S1-S2 present, rhythmic, no gallops, rubs, or murmurs. ++ pitting bilateral lower extremity edema. Pulmonary: vesicular breath sounds bilaterally, adequate air movement, no wheezing, rhonchi or rales. Gastrointestinal. Abdomen soft and non tender Skin. No rashes Musculoskeletal: no joint deformities     Data Reviewed: I have personally reviewed following labs and imaging studies  CBC: Recent Labs  Lab 03/31/20 1759 04/02/20 0231  WBC 5.8 7.0  HGB 13.7 13.9  HCT 43.8 42.9  MCV 91.1 89.0  PLT 228 270   Basic Metabolic Panel: Recent Labs  Lab 03/31/20 1759 04/01/20 0923 04/02/20 0231 04/03/20 0548  NA 142  --  141 142  K 4.0  --  3.3* 3.5  CL 106  --  103 103  CO2 21*  --  28 27  GLUCOSE 150*  --  156* 144*  BUN 23  --  20 25*  CREATININE 0.69  --  0.77 0.93  CALCIUM 9.3  --  9.4 9.3  MG  --  2.0  --   --    GFR: Estimated Creatinine Clearance: 69.7 mL/min (by C-G formula based on SCr of 0.93 mg/dL). Liver Function Tests: No results for input(s): AST, ALT, ALKPHOS, BILITOT, PROT, ALBUMIN in the last 168 hours. No results for input(s): LIPASE, AMYLASE in the last 168 hours. No results for input(s): AMMONIA in the last 168 hours. Coagulation Profile: No results for input(s): INR, PROTIME in the last 168 hours. Cardiac Enzymes: No results for input(s): CKTOTAL, CKMB, CKMBINDEX,  TROPONINI in the last 168 hours. BNP (last 3 results) No results for input(s): PROBNP in the last 8760 hours. HbA1C: Recent Labs    04/01/20 0923  HGBA1C 7.5*   CBG: Recent Labs  Lab 04/02/20 1135 04/02/20 1647 04/02/20 2102 04/03/20 0612 04/03/20 1102  GLUCAP 157* 182* 225* 157* 181*   Lipid Profile: Recent Labs    04/02/20 0231  CHOL 200  HDL 37*  LDLCALC 142*  TRIG 104  CHOLHDL 5.4   Thyroid Function Tests: Recent Labs    04/01/20 0923  TSH 1.866   Anemia Panel: No results for input(s): VITAMINB12, FOLATE, FERRITIN, TIBC, IRON, RETICCTPCT in the last 72 hours.    Radiology Studies: I have reviewed all of the imaging during this hospital visit personally     Scheduled Meds: . apixaban  5 mg Oral BID  . atorvastatin  80 mg Oral Daily  . digoxin  0.125 mg Oral Daily  . furosemide  80 mg Intravenous BID  . insulin aspart  0-15 Units Subcutaneous TID WC  . insulin aspart  0-5 Units Subcutaneous QHS  . [START ON 04/04/2020] lisinopril  10 mg Oral Daily  . metoprolol tartrate  25 mg Oral Q6H  . potassium chloride  40 mEq Oral Daily  . sodium chloride flush  3 mL Intravenous Q12H  . spironolactone  12.5 mg Oral Daily   Continuous Infusions: . sodium chloride    . amiodarone 30 mg/hr (04/03/20 0752)     LOS: 2 days        Kimm Sider Gerome Apley, MD

## 2020-04-04 ENCOUNTER — Other Ambulatory Visit: Payer: Self-pay

## 2020-04-04 ENCOUNTER — Encounter (HOSPITAL_COMMUNITY): Payer: Self-pay | Admitting: Internal Medicine

## 2020-04-04 ENCOUNTER — Inpatient Hospital Stay (HOSPITAL_COMMUNITY): Payer: Medicare Other | Admitting: Certified Registered"

## 2020-04-04 ENCOUNTER — Encounter (HOSPITAL_COMMUNITY)
Admission: EM | Disposition: A | Discharge status: Home Health | Payer: Self-pay | Source: Ambulatory Visit | Attending: Internal Medicine

## 2020-04-04 ENCOUNTER — Inpatient Hospital Stay (HOSPITAL_COMMUNITY): Payer: Medicare Other

## 2020-04-04 DIAGNOSIS — I340 Nonrheumatic mitral (valve) insufficiency: Secondary | ICD-10-CM

## 2020-04-04 DIAGNOSIS — I5021 Acute systolic (congestive) heart failure: Secondary | ICD-10-CM

## 2020-04-04 DIAGNOSIS — I4891 Unspecified atrial fibrillation: Secondary | ICD-10-CM

## 2020-04-04 DIAGNOSIS — I361 Nonrheumatic tricuspid (valve) insufficiency: Secondary | ICD-10-CM

## 2020-04-04 HISTORY — PX: TEE WITHOUT CARDIOVERSION: SHX5443

## 2020-04-04 HISTORY — PX: CARDIOVERSION: SHX1299

## 2020-04-04 LAB — BASIC METABOLIC PANEL
Anion gap: 11 (ref 5–15)
BUN: 32 mg/dL — ABNORMAL HIGH (ref 8–23)
CO2: 26 mmol/L (ref 22–32)
Calcium: 9.4 mg/dL (ref 8.9–10.3)
Chloride: 104 mmol/L (ref 98–111)
Creatinine, Ser: 1.07 mg/dL — ABNORMAL HIGH (ref 0.44–1.00)
GFR calc Af Amer: 59 mL/min — ABNORMAL LOW (ref >60)
GFR calc non Af Amer: 51 mL/min — ABNORMAL LOW (ref >60)
Glucose, Bld: 168 mg/dL — ABNORMAL HIGH (ref 70–99)
Potassium: 4.8 mmol/L (ref 3.5–5.1)
Sodium: 141 mmol/L (ref 135–145)

## 2020-04-04 LAB — PROTIME-INR
INR: 1.3 — ABNORMAL HIGH (ref 0.8–1.2)
Prothrombin Time: 15.6 seconds — ABNORMAL HIGH (ref 11.4–15.2)

## 2020-04-04 LAB — GLUCOSE, CAPILLARY
Glucose-Capillary: 157 mg/dL — ABNORMAL HIGH (ref 70–99)
Glucose-Capillary: 204 mg/dL — ABNORMAL HIGH (ref 70–99)
Glucose-Capillary: 131 mg/dL — ABNORMAL HIGH (ref 70–99)
Glucose-Capillary: 143 mg/dL — ABNORMAL HIGH (ref 70–99)

## 2020-04-04 SURGERY — ECHOCARDIOGRAM, TRANSESOPHAGEAL
Anesthesia: Monitor Anesthesia Care

## 2020-04-04 MED ORDER — METOPROLOL TARTRATE 25 MG PO TABS
25.0000 mg | ORAL_TABLET | Freq: Two times a day (BID) | ORAL | Status: DC
  Administered 2020-04-04: 25 mg via ORAL
  Filled 2020-04-04 (×2): qty 1

## 2020-04-04 MED ORDER — AMIODARONE HCL 200 MG PO TABS
400.0000 mg | ORAL_TABLET | Freq: Two times a day (BID) | ORAL | Status: DC
  Administered 2020-04-04 (×2): 400 mg via ORAL
  Filled 2020-04-04 (×3): qty 2

## 2020-04-04 MED ORDER — PROPOFOL 500 MG/50ML IV EMUL
INTRAVENOUS | Status: DC | PRN
  Administered 2020-04-04: 100 ug/kg/min via INTRAVENOUS

## 2020-04-04 MED ORDER — SODIUM CHLORIDE 0.9 % IV SOLN: INTRAVENOUS | Status: DC

## 2020-04-04 MED ORDER — LACTATED RINGERS IV SOLN: INTRAVENOUS | Status: DC | PRN

## 2020-04-04 NOTE — Anesthesia Preprocedure Evaluation (Addendum)
Anesthesia Evaluation  Patient identified by MRN, date of birth, ID band  Reviewed: Allergy & Precautions, NPO status , Patient's Chart, lab work & pertinent test results  Airway Mallampati: II  TM Distance: >3 FB Neck ROM: Limited    Dental  (+) Missing   Pulmonary former smoker,    breath sounds clear to auscultation       Cardiovascular Exercise Tolerance: Good hypertension,  Rhythm:Regular Rate:Tachycardia  Amiodarone gtt HR 130-140   Neuro/Psych negative neurological ROS  negative psych ROS   GI/Hepatic negative GI ROS, Neg liver ROS,   Endo/Other  diabetesMorbid obesity  Renal/GU negative Renal ROS  negative genitourinary   Musculoskeletal negative musculoskeletal ROS (+)   Abdominal (+) + obese,   Peds  Hematology   Anesthesia Other Findings   Reproductive/Obstetrics                            Anesthesia Physical Anesthesia Plan  ASA: IV  Anesthesia Plan: General   Post-op Pain Management:    Induction:   PONV Risk Score and Plan: 2 and Propofol infusion, TIVA and Treatment may vary due to age or medical condition  Airway Management Planned: Natural Airway, Simple Face Mask and Nasal Cannula  Additional Equipment:   Intra-op Plan:   Post-operative Plan:   Informed Consent: I have reviewed the patients History and Physical, chart, labs and discussed the procedure including the risks, benefits and alternatives for the proposed anesthesia with the patient or authorized representative who has indicated his/her understanding and acceptance.       Plan Discussed with:   Anesthesia Plan Comments:        Anesthesia Quick Evaluation

## 2020-04-04 NOTE — Interval H&P Note (Signed)
History and Physical Interval Note:  04/04/2020 8:13 AM  Melissa Bishop  has presented today for surgery, with the diagnosis of AFIB.  The various methods of treatment have been discussed with the patient and family. After consideration of risks, benefits and other options for treatment, the patient has consented to  Procedure(s): TRANSESOPHAGEAL ECHOCARDIOGRAM (TEE) (N/A) CARDIOVERSION (N/A) as a surgical intervention.  The patient's history has been reviewed, patient examined, no change in status, stable for surgery.  I have reviewed the patient's chart and labs.  Questions were answered to the patient's satisfaction.     [employer]

## 2020-04-04 NOTE — Transfer of Care (Signed)
Immediate Anesthesia Transfer of Care Note  Patient: Melissa Bishop  Procedure(s) Performed: TRANSESOPHAGEAL ECHOCARDIOGRAM (TEE) (N/A ) CARDIOVERSION (N/A )  Patient Location: Endoscopy Unit  Anesthesia Type:General  Level of Consciousness: awake, alert  and oriented  Airway & Oxygen Therapy: Patient Spontanous Breathing and Patient connected to nasal cannula oxygen  Post-op Assessment: Report given to RN  Post vital signs: Reviewed and stable  Last Vitals:  Vitals Value Taken Time  BP    Temp 36.6 C 04/04/20 0853  Pulse 55 04/04/20 0856  Resp 23 04/04/20 0856  SpO2 99 % 04/04/20 0856  Vitals shown include unvalidated device data.  Last Pain:  Vitals:   04/04/20 0853  TempSrc: Oral  PainSc: 0-No pain         Complications: No complications documented.

## 2020-04-04 NOTE — TOC Progression Note (Signed)
Transition of Care [facility]) - Progression Note    Patient Details  Name: Melissa Bishop MRN: [id number] Date of Birth: 21-Jan-1946  [facility] CM/SW Contact  Zenon Mayo, RN Phone Number: 04/04/2020, 3:55 PM  Clinical Narrative:    NCM spoke with patient regarding [facility] for CHF management, she states I will need to call her mother to talk to her about it.  NCM contacted mother, vm states not able to take call right now.         Expected Discharge Plan and Services                                                 Social Determinants of Health (SDOH) Interventions    Readmission Risk Interventions No flowsheet data found.

## 2020-04-04 NOTE — Anesthesia Procedure Notes (Signed)
Procedure Name: MAC Date/Time: 04/04/2020 8:19 AM Performed by: Barrington Ellison, CRNA Pre-anesthesia Checklist: Patient identified, Emergency Drugs available, Suction available, Patient being monitored and Timeout performed Patient Re-evaluated:Patient Re-evaluated prior to induction Oxygen Delivery Method: Nasal cannula

## 2020-04-04 NOTE — Progress Notes (Addendum)
Progress Note  Patient Name: Melissa Bishop Date of Encounter: 04/04/2020  [facility] Cardiologist: Evalina Field, MD   Subjective   No chest pain, still dyspneic with exertion but overall feels better.   Still on IV amiodarone.   Inpatient Medications    Scheduled Meds:  apixaban  5 mg Oral BID   atorvastatin  80 mg Oral Daily   digoxin  0.125 mg Oral Daily   furosemide  80 mg Intravenous BID   insulin aspart  0-15 Units Subcutaneous TID WC   insulin aspart  0-5 Units Subcutaneous QHS   lisinopril  10 mg Oral Daily   metoprolol tartrate  25 mg Oral Q6H   potassium chloride  40 mEq Oral Daily   sodium chloride flush  3 mL Intravenous Q12H   spironolactone  12.5 mg Oral Daily   Continuous Infusions:  sodium chloride     sodium chloride 20 mL/hr at 04/04/20 1020   amiodarone 30 mg/hr (04/04/20 0813)   PRN Meds: sodium chloride, acetaminophen, metoprolol tartrate, ondansetron (ZOFRAN) IV, sodium chloride flush   Vital Signs    Vitals:   04/04/20 1006 04/04/20 1021 04/04/20 1028 04/04/20 1110  BP: 92/64  (!) 93/51   Pulse: (!) 55 (!) 54    Resp: 17     Temp: 97.8 F (36.6 C)   (!) 97.3 F (36.3 C)  TempSrc: Oral   Oral  SpO2: 97%     Weight:      Height:        Intake/Output Summary (Last 24 hours) at 04/04/2020 1137 Last data filed at 04/04/2020 0844 Gross per 24 hour  Intake 1976.03 ml  Output 750 ml  Net 1226.03 ml   Last 3 Weights 04/04/2020 04/02/2020 04/01/2020  Weight (lbs) 227 lb 9.6 oz 224 lb 8 oz 229 lb 3.2 oz  Weight (kg) 103.239 kg 101.833 kg 103.964 kg      Telemetry    SR PACS  - Personally Reviewed  ECG    SR - Personally Reviewed  Physical Exam   GEN: No acute distress.   Neck: No JVD Cardiac: RRR, no murmurs, rubs, or gallops.  Respiratory: Clear to auscultation bilaterally. GI: Soft, nontender, non-distended  MS: No edema; No deformity. Neuro:  Nonfocal  Psych: Normal affect   Labs    High Sensitivity Troponin:     Recent Labs  Lab 03/31/20 1759 03/31/20 2028  TROPONINIHS 16 17      Chemistry Recent Labs  Lab 04/02/20 0231 04/03/20 0548 04/04/20 0541  NA 141 142 141  K 3.3* 3.5 4.8  CL 103 103 104  CO2 28 27 26   GLUCOSE 156* 144* 168*  BUN 20 25* 32*  CREATININE 0.77 0.93 1.07*  CALCIUM 9.4 9.3 9.4  GFRNONAA >60 >60 51*  GFRAA >60 >60 59*  ANIONGAP 10 12 11      Hematology Recent Labs  Lab 03/31/20 1759 04/02/20 0231  WBC 5.8 7.0  RBC 4.81 4.82  HGB 13.7 13.9  HCT 43.8 42.9  MCV 91.1 89.0  MCH 28.5 28.8  MCHC 31.3 32.4  RDW 13.9 13.7  PLT 228 214    BNP Recent Labs  Lab 04/01/20 0924  BNP 180.8*     DDimer No results for input(s): DDIMER in the last 168 hours.   Radiology    No results found.  Cardiac Studies   TTE: 04/01/2020    1. Left ventricular ejection fraction, by estimation, is 25%. The left  ventricle has  severely decreased function. Global hypokinesis. Left  ventricular endocardial border not optimally defined to evaluate regional  wall motion despite the use of Definity  contrast. Left ventricular diastolic parameters are indeterminate.   2. Right ventricular systolic function is severely reduced. The right  ventricular size is mildly enlarged. There is mildly elevated pulmonary  artery systolic pressure. The estimated right ventricular systolic  pressure is 08.6 mmHg.   3. Left atrial size was mildly dilated.   4. Right atrial size was mildly dilated.   5. The mitral valve is grossly normal. No evidence of mitral valve  regurgitation.   6. The aortic valve is grossly normal. Aortic valve regurgitation is not  visualized.   7. The inferior vena cava is dilated in size with <50% respiratory  variability, suggesting right atrial pressure of 15 mmHg.     04/04/20  TEE  Findings:   Left Ventricle: 20-25% EF   Mitral Valve: Mild to moderate MR   Aortic Valve: No AI   Tricuspid Valve: Mild TR   Left Atrium: No LAA thrombus   DCCV   04/04/20 Successful cardioversion of atrial fibrillation/flutter with one DCCV 120J  Patient Profile     74 y.o. female with history of diabetes, psoriasis, hypertension, atrial fibrillation in the past on Eliquis who was admitted on 04/01/2020 for acute decompensated systolic heart failure and atrial fibrillation with RVR.  Assessment & Plan    New acute systolic HF with EF 76-19% with free flowing plerual effusions bil. on admit. --positive 697 since admit now with increase of PO intake on lasix 80 IV BID  --wt 104 Kg on admit down to 101.8 but now 103.2 Kg  (her po intake was 2066 yesterday) add fluid restriction 1500cc IV lasix for one more day but defer to Dr. Harrington Challenger --ckd-3a with cr 1.07 up from 0.93   Atrial fib now post DCCV today in SR. Does have PACs  ( In A fib HR difficult to control)  On amiodarone drip, dig, and lopressor 25 mg every 6 hours though has only rec'd once a day due to hypotension.  --continue eliquis for Cha2DS2Vasc of 4 --decrease BB, stop dig, and change amiodarone to po 400 BID here.  Decrease BB to 25 BID and with parameters  HTN  BP 123/95 to 93/51   DM-2 per IM   CAD per CT scan of chest --There is aortic atherosclerosis as well as foci of coronary artery calcification.      For questions or updates, please contact [place] Please consult [email or web address] for contact info under        Signed, Cecilie Kicks, NP  04/04/2020, 11:37 AM    Pt seen and examined   I agree with findings as noted above by L Ingold Pt is s/p cardioversion today  Comfortable  eating lunch  On exam: Lungs are clear   Cardiac exam:   RRR   No S3   Ext are with Tr edema  Continue IV diuresis for now    Pt should be on low Na / 1500 cc restricted diet   Will have dietary come see pt Stop digoxin   Decreased b blocker   keep on 400 bid amio for now Will reassess in AM  for possible d/c  Dorris Carnes MD

## 2020-04-04 NOTE — CV Procedure (Signed)
° °  Transesophageal Echocardiogram  Indications: AFIB/FLUTTER  Time out performed  Propofol with anesthesia  Findings:  Left Ventricle: 20-25% EF  Mitral Valve: Mild to moderate MR  Aortic Valve: No AI  Tricuspid Valve: Mild TR  Left Atrium: No LAA thrombus   [name], MD     Electrical Cardioversion Procedure Note Melissa Bishop [id number] [birth year]  Procedure: Electrical Cardioversion Indications:  Atrial Fibrillation  Time Out: Verified patient identification, verified procedure,medications/allergies/relevent history reviewed, required imaging and test results available.  Performed  Procedure Details  The patient was NPO after midnight. Anesthesia was administered at the beside with propofol.  Cardioversion was performed with synchronized biphasic defibrillation via AP pads with 120 joules.  1 attempt(s) were performed.  The patient converted to normal sinus rhythm. The patient tolerated the procedure well   IMPRESSION:  Successful cardioversion of atrial fibrillation/flutter with one DCCV 120J   [name] 04/04/2020, 8:43 AM

## 2020-04-04 NOTE — Progress Notes (Signed)
PROGRESS NOTE    Melissa Bishop  HUD:[id number]  DOB: [birth year]  DOA: 03/31/2020 PCP: [name], MD Outpatient Specialists:   Hospital course:  74 year old female with HTN, HL, DM 2 and psoriasis was admitted on 04/01/2020 with progressive DOE.  Work-up revealed new onset A. fib with RVR and chest x-ray showed bilateral opacities with bilateral pleural effusions.  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  Patient was seen by cardiology who has started patient on Lasix and Eliquis but times a rate control were difficult.  Patient underwent DCCV cardioversion on 04/04/2020 and is presently in sinus rhythm.  Subjective:  Patient states she is doing okay.  Notes she tolerated procedure earlier today without difficulty as far she is aware.  Notes that her blood pressure is low but denies feeling dizzy or weak.  States she feels at baseline.  Denies new shortness of breath or chest pain.  Notes she still has some aching in her legs from the swelling.  Has not been walking around too much.  Denies orthopnea.   Objective: Vitals:   04/04/20 1216 04/04/20 1230 04/04/20 1245 04/04/20 1300  BP: (!) 109/57 (!) 86/73 (!) 79/50 (!) 88/58  Pulse: 60   (!) 59  Resp: 20 20 15 20   Temp:      TempSrc:      SpO2:      Weight:      Height:        Intake/Output Summary (Last 24 hours) at 04/04/2020 1541 Last data filed at 04/04/2020 1239 Gross per 24 hour  Intake 1916.03 ml  Output 1250 ml  Net 666.03 ml   Filed Weights   04/01/20 1322 04/02/20 0535 04/04/20 0512  Weight: 104 kg 101.8 kg 103.2 kg     Exam:  General: Appearing stated age sitting on side of bed in no acute distress.  She had slight on labored tachypnea. Eyes: sclera anicteric, conjuctiva mild injection bilaterally CVS: S1-S2, regular  Respiratory: She does have decreased breath sounds at bases with rales above it bilaterally.   GI: NABS, soft,  NT  LE: 2-3+ edema bilaterally.  Neuro: A/O x 3, Moving all extremities equally with normal strength, CN 3-12 intact, grossly nonfocal.  Psych: patient is logical and coherent, judgement and insight appear normal, mood and affect appropriate to situation.   Assessment & Plan:   New onset atrial fibrillation with RVR Patient underwent DCCV cardioversion today and is presently in sinus rhythm. Previous attempts at rate control of A. fib was difficult. Very much appreciate ongoing cardiology consultation, digoxin was discontinued and metoprolol was decreased to 25 twice daily due to hypotension. Presently on p.o. amiodarone  New diagnosis of acute on chronic systolic heart failure And is on Lasix 80 IV twice daily however weight has increased to 103 kg today thought to be secondary to decreased fluid intake.  Cardiology has added a fluid restriction  Acute worsening of kidney function Creatinine is increasing but is late with aggressive diuresis On admission creatinine was 0.8 it is now up to 1.07, will need to follow closely.  DM2 Sugars reasonably controlled on SSI AC at bedtime and carb controlled diet  HTN Pressure has been low, Toprol has been decreased to 25 twice daily as noted above    DVT prophylaxis: Apixaban Code Status: Full Family Communication: No family at bedside Disposition Plan:   Patient is from: Home  Anticipated Discharge Location:  Home  Barriers to Discharge: Status post cardioversion today  Is patient medically stable for Discharge: Not yet   Consultants:  Cardiology  Procedures:  Status post DCCV today 04/04/2020  Antimicrobials:  None   Data Reviewed:  Basic Metabolic Panel: Recent Labs  Lab 03/31/20 1759 04/01/20 0923 04/02/20 0231 04/03/20 0548 04/04/20 0541  NA 142  --  141 142 141  K 4.0  --  3.3* 3.5 4.8  CL 106  --  103 103 104  CO2 21*  --  28 27 26   GLUCOSE 150*  --  156* 144* 168*  BUN 23  --  20 25* 32*  CREATININE  0.69  --  0.77 0.93 1.07*  CALCIUM 9.3  --  9.4 9.3 9.4  MG  --  2.0  --   --   --    Liver Function Tests: No results for input(s): AST, ALT, ALKPHOS, BILITOT, PROT, ALBUMIN in the last 168 hours. No results for input(s): LIPASE, AMYLASE in the last 168 hours. No results for input(s): AMMONIA in the last 168 hours. CBC: Recent Labs  Lab 03/31/20 1759 04/02/20 0231  WBC 5.8 7.0  HGB 13.7 13.9  HCT 43.8 42.9  MCV 91.1 89.0  PLT 228 214   Cardiac Enzymes: No results for input(s): CKTOTAL, CKMB, CKMBINDEX, TROPONINI in the last 168 hours. BNP (last 3 results) No results for input(s): PROBNP in the last 8760 hours. CBG: Recent Labs  Lab 04/03/20 1102 04/03/20 1618 04/03/20 2118 04/04/20 0543 04/04/20 1135  GLUCAP 181* 126* 173* 157* 204*    Recent Results (from the past 240 hour(s))  SARS Coronavirus 2 by RT PCR (hospital order, performed in [facility] hospital lab) Nasopharyngeal Nasopharyngeal Swab     Status: None   Collection Time: 04/01/20  5:49 AM   Specimen: Nasopharyngeal Swab  Result Value Ref Range Status   SARS Coronavirus 2 NEGATIVE NEGATIVE Final    Comment: (NOTE) SARS-CoV-2 target nucleic acids are NOT DETECTED.  The SARS-CoV-2 RNA is generally detectable in upper and lower respiratory specimens during the acute phase of infection. The lowest concentration of SARS-CoV-2 viral copies this assay can detect is 250 copies / mL. A negative result does not preclude SARS-CoV-2 infection and should not be used as the sole basis for treatment or other patient management decisions.  A negative result may occur with improper specimen collection / handling, submission of specimen other than nasopharyngeal swab, presence of viral mutation(s) within the areas targeted by this assay, and inadequate number of viral copies (<250 copies / mL). A negative result must be combined with clinical observations, patient history, and epidemiological information.  Fact Sheet  for Patients:   StrictlyIdeas.no  Fact Sheet for Healthcare Providers: BankingDealers.co.za  This test is not yet approved or  cleared by the [place] FDA and has been authorized for detection and/or diagnosis of SARS-CoV-2 by FDA under an Emergency Use Authorization (EUA).  This EUA will remain in effect (meaning this test can be used) for the duration of the COVID-19 declaration under Section 564(b)(1) of the Act, 21 U.S.C. section 360bbb-3(b)(1), unless the authorization is terminated or revoked sooner.  Performed at [facility], [place] [address], [place], [place] [address]       Studies: ECHO TEE  Result Date: 04/04/2020    TRANSESOPHOGEAL ECHO REPORT   Patient Name:   EMIL KLASSEN Date of Exam: 04/04/2020 Medical Rec #:  [id number]     Height:  71.0 in Accession #:    [id number]    Weight:       227.6 lb Date of Birth:  11-04-1945     BSA:          2.228 m Patient Age:    39 years      BP:           95/64 mmHg Patient Gender: F             HR:           134 bpm. Exam Location:  Inpatient Procedure: Transesophageal Echo Indications:     427.31 atrial fibrillation  History:         Patient has prior history of Echocardiogram examinations, most                  recent 04/01/2020. Risk Factors:Hypertension, Diabetes,                  Dyslipidemia and Former Smoker.  Sonographer:     Jannett Celestine RDCS (AE) Referring Phys:  [id number] MARK C SKAINS Diagnosing Phys: [name] MD PROCEDURE: The transesophogeal probe was passed without difficulty through the esophogus of the patient. Sedation performed by different physician. The patient's vital signs; including heart rate, blood pressure, and oxygen saturation; remained stable throughout the procedure. The patient developed no complications during the procedure. IMPRESSIONS  1. Left ventricular ejection fraction, by estimation, is 20 to 25%. The left ventricle has severely decreased  function. The left ventricle has no regional wall motion abnormalities.  2. Right ventricular systolic function is normal. The right ventricular size is normal.  3. Left atrial size was moderately dilated. No left atrial/left atrial appendage thrombus was detected.  4. The mitral valve is normal in structure. Mild to moderate mitral valve regurgitation. No evidence of mitral stenosis.  5. Tricuspid valve regurgitation is mild to moderate.  6. The aortic valve is normal in structure. Aortic valve regurgitation is not visualized. No aortic stenosis is present.  7. The inferior vena cava is normal in size with greater than 50% respiratory variability, suggesting right atrial pressure of 3 mmHg. FINDINGS  Left Ventricle: Left ventricular ejection fraction, by estimation, is 20 to 25%. The left ventricle has severely decreased function. The left ventricle has no regional wall motion abnormalities. The left ventricular internal cavity size was normal in size. There is no left ventricular hypertrophy. Right Ventricle: The right ventricular size is normal. No increase in right ventricular wall thickness. Right ventricular systolic function is normal. Left Atrium: Left atrial size was moderately dilated. No left atrial/left atrial appendage thrombus was detected. Right Atrium: Right atrial size was normal in size. Pericardium: There is no evidence of pericardial effusion. Mitral Valve: The mitral valve is normal in structure. Normal mobility of the mitral valve leaflets. Mild to moderate mitral valve regurgitation. No evidence of mitral valve stenosis. Tricuspid Valve: The tricuspid valve is normal in structure. Tricuspid valve regurgitation is mild to moderate. No evidence of tricuspid stenosis. Aortic Valve: The aortic valve is normal in structure. Aortic valve regurgitation is not visualized. No aortic stenosis is present. Pulmonic Valve: The pulmonic valve was normal in structure. Pulmonic valve regurgitation is not  visualized. No evidence of pulmonic stenosis. Aorta: The aortic root is normal in size and structure. Venous: The inferior vena cava is normal in size with greater than 50% respiratory variability, suggesting right atrial pressure of 3 mmHg. IAS/Shunts: No atrial level shunt detected by color flow Doppler.  [name] MD Electronically signed by [name] MD Signature Date/Time: 04/04/2020/12:38:34 PM    Final (Updated)      Scheduled Meds: . amiodarone  400 mg Oral BID  . apixaban  5 mg Oral BID  . atorvastatin  80 mg Oral Daily  . furosemide  80 mg Intravenous BID  . insulin aspart  0-15 Units Subcutaneous TID WC  . insulin aspart  0-5 Units Subcutaneous QHS  . lisinopril  10 mg Oral Daily  . metoprolol tartrate  25 mg Oral BID  . potassium chloride  40 mEq Oral Daily  . sodium chloride flush  3 mL Intravenous Q12H  . spironolactone  12.5 mg Oral Daily   Continuous Infusions: . sodium chloride    . sodium chloride 20 mL/hr at 04/04/20 1020    Principal Problem:   Atrial fibrillation with rapid ventricular response (HCC) Active Problems:   Psoriasis   Constipation   Essential hypertension   Diabetes mellitus type II, non insulin dependent ([place])   Dyspnea   Acute systolic heart failure ([place])     Kendric Sindelar Derek Jack, [facility]  If 7PM-7AM, please contact night-coverage [email or web address] Password [email or web address] 04/04/2020, 3:41 PM    LOS: 3 days

## 2020-04-04 NOTE — Progress Notes (Signed)
Consent obtain from brother Draya Felker via telephone with Clarene Duke, CRNA, brother agreed to go ahead with procedure.

## 2020-04-04 NOTE — TOC Transition Note (Signed)
Transition of Care [facility]) - CM/SW Discharge Note   Patient Details  Name: Melissa Bishop MRN: [id number] Date of Birth: 1945-12-18  [facility] CM/SW Contact:  Zenon Mayo, RN Phone Number: 04/04/2020, 4:34 PM   Clinical Narrative:    NCM spoke with patient mom, she does not have a preference of home health agency. NCM made referral to [facility] with [facility] for [facility], she is able to take referral, soc will begin 24 to 48 hrs post dc.    Final next level of care: [place] Barriers to Discharge: No Barriers Identified   Patient Goals and CMS Choice Patient states their goals for this hospitalization and ongoing recovery are:: get better CMS Medicare.gov Compare Post Acute Care list provided to:: Patient Represenative (must comment) Choice offered to / list presented to : Parent  Discharge Placement                       Discharge Plan and Services                  DME Agency: NA       HH Arranged: RN, Disease Management [place] Agency: [place] Date [facility] Agency Contacted: 04/04/20 Time [place]: [phone] Representative spoke with at [place]: [place] Determinants of Health ([place]) Interventions     Readmission Risk Interventions No flowsheet data found.

## 2020-04-04 NOTE — Progress Notes (Signed)
  Echocardiogram Echocardiogram Transesophageal has been performed.  Melissa Bishop 04/04/2020, 8:53 AM

## 2020-04-04 NOTE — Anesthesia Postprocedure Evaluation (Signed)
Anesthesia Post Note  Patient: Melissa Bishop  Procedure(s) Performed: TRANSESOPHAGEAL ECHOCARDIOGRAM (TEE) (N/A ) CARDIOVERSION (N/A )     Patient location during evaluation: PACU Anesthesia Type: General Level of consciousness: awake and alert Pain management: pain level controlled Vital Signs Assessment: post-procedure vital signs reviewed and stable Respiratory status: spontaneous breathing, nonlabored ventilation, respiratory function stable and patient connected to nasal cannula oxygen Cardiovascular status: stable and blood pressure returned to baseline Postop Assessment: no apparent nausea or vomiting Anesthetic complications: no   No complications documented.  Last Vitals:  Vitals:   04/04/20 0900 04/04/20 0916  BP: (!) 123/95 (!) 91/56  Pulse: (!) 39 71  Resp: (!) 23 16  Temp:    SpO2: 98% 95%    Last Pain:  Vitals:   04/04/20 0853  TempSrc: Oral  PainSc: 0-No pain                 Merlinda [place]

## 2020-04-05 LAB — BASIC METABOLIC PANEL
Anion gap: 14 (ref 5–15)
BUN: 35 mg/dL — ABNORMAL HIGH (ref 8–23)
CO2: 24 mmol/L (ref 22–32)
Calcium: 9.4 mg/dL (ref 8.9–10.3)
Chloride: 102 mmol/L (ref 98–111)
Creatinine, Ser: 1.19 mg/dL — ABNORMAL HIGH (ref 0.44–1.00)
GFR calc Af Amer: 52 mL/min — ABNORMAL LOW (ref >60)
GFR calc non Af Amer: 45 mL/min — ABNORMAL LOW (ref >60)
Glucose, Bld: 245 mg/dL — ABNORMAL HIGH (ref 70–99)
Potassium: 4.1 mmol/L (ref 3.5–5.1)
Sodium: 140 mmol/L (ref 135–145)

## 2020-04-05 LAB — GLUCOSE, CAPILLARY
Glucose-Capillary: 267 mg/dL — ABNORMAL HIGH (ref 70–99)
Glucose-Capillary: 77 mg/dL (ref 70–99)
Glucose-Capillary: 113 mg/dL — ABNORMAL HIGH (ref 70–99)
Glucose-Capillary: 138 mg/dL — ABNORMAL HIGH (ref 70–99)

## 2020-04-05 LAB — BRAIN NATRIURETIC PEPTIDE: B Natriuretic Peptide: 57.9 pg/mL (ref 0.0–100.0)

## 2020-04-05 MED ORDER — METOPROLOL SUCCINATE ER 25 MG PO TB24
25.0000 mg | ORAL_TABLET | Freq: Two times a day (BID) | ORAL | Status: DC
  Administered 2020-04-05 – 2020-04-07 (×6): 25 mg via ORAL
  Filled 2020-04-05 (×6): qty 1

## 2020-04-05 MED ORDER — LISINOPRIL 5 MG PO TABS
2.5000 mg | ORAL_TABLET | Freq: Every day | ORAL | Status: DC
  Administered 2020-04-06 – 2020-04-07 (×2): 2.5 mg via ORAL
  Filled 2020-04-05 (×3): qty 1

## 2020-04-05 MED ORDER — AMIODARONE HCL 200 MG PO TABS
200.0000 mg | ORAL_TABLET | Freq: Two times a day (BID) | ORAL | Status: DC
  Administered 2020-04-06 – 2020-04-07 (×4): 200 mg via ORAL
  Filled 2020-04-05 (×4): qty 1

## 2020-04-05 NOTE — Progress Notes (Signed)
Nutrition Education Note  RD working remotely.  RD attempted to speak with pt via call to hospital room phone, however, no answer.   RD consulted for nutrition education regarding CHF and diabetes.   Lab Results  Component Value Date   HGBA1C 7.5 (H) 04/01/2020   PTA DM medications are 500 mg metformin BID. Per ADA's Standards of Medical Care for Diabetes, glycemic targets for elderly patients who are otherwise healthy (few medical impairments) and cognitively intact should be less stringent (Hgb A1c <7.5).   Labs reviewed: CBGS: 131-204 (inpatient orders for glycemic control are 0-15 units insulin aspart TID with meals and 0-5 units insulin aspart daly at bedtime).   RD provided "Heart Healthy, Consistent Carbohydrate Nutrition Therapy" handout from the Academy of Nutrition and Dietetics. Attached handout to AVS/ discharge summary.   Current diet order is heart healthy, patient is consuming approximately 50-100%% of meals at this time. Labs and medications reviewed. No further nutrition interventions warranted at this time. RD contact information provided. If additional nutrition issues arise, please re-consult RD.   Loistine Chance, RD, LDN, [place] Registered Dietitian II Certified Diabetes Care and Education Specialist Please refer to [facility] for RD and/or RD on-call/weekend/after hours pager

## 2020-04-05 NOTE — Progress Notes (Signed)
Inpatient Diabetes Program Recommendations  AACE/ADA: New Consensus Statement on Inpatient Glycemic Control (2015)  Target Ranges:  Prepandial:   less than 140 mg/dL      Peak postprandial:   less than 180 mg/dL (1-2 hours)      Critically ill patients:  140 - 180 mg/dL   Lab Results  Component Value Date   GLUCAP 77 04/05/2020   HGBA1C 7.5 (H) 04/01/2020    Review of Glycemic Control Results for Melissa Bishop, Melissa Bishop (MRN [id number]) as of 04/05/2020 12:49  Ref. Range 04/04/2020 11:35 04/04/2020 16:31 04/04/2020 21:31 04/05/2020 06:18 04/05/2020 11:48  Glucose-Capillary Latest Ref Range: 70 - 99 mg/dL 204 (H) 131 (H) 143 (H) 267 (H) 77   Diabetes history: DM 2 Outpatient Diabetes medications:  Metformin 500 mg bid Current orders for Inpatient glycemic control:  Novolog moderate tid with meals and HS Inpatient Diabetes Program Recommendations:    Consider reducing Novolog correction to sensitive tid with meals.   Thanks  Adah Perl, RN, BC-ADM Inpatient Diabetes Coordinator Pager [phone] (8a-5p)

## 2020-04-05 NOTE — Progress Notes (Signed)
Progress Note  Patient Name: Melissa Bishop Date of Encounter: 04/05/2020  [facility] Cardiologist: Evalina Field, MD   Subjective   Pt is breathing OK  No CP    Inpatient Medications    Scheduled Meds: . amiodarone  400 mg Oral BID  . apixaban  5 mg Oral BID  . atorvastatin  80 mg Oral Daily  . furosemide  80 mg Intravenous BID  . insulin aspart  0-15 Units Subcutaneous TID WC  . insulin aspart  0-5 Units Subcutaneous QHS  . lisinopril  10 mg Oral Daily  . metoprolol tartrate  25 mg Oral BID  . potassium chloride  40 mEq Oral Daily  . sodium chloride flush  3 mL Intravenous Q12H  . spironolactone  12.5 mg Oral Daily   Continuous Infusions: . sodium chloride    . sodium chloride 20 mL/hr at 04/05/20 0305   PRN Meds: sodium chloride, acetaminophen, metoprolol tartrate, ondansetron (ZOFRAN) IV, sodium chloride flush   Vital Signs    Vitals:   04/04/20 2217 04/05/20 0000 04/05/20 0400 04/05/20 0501  BP: 110/70 108/62 (!) 104/56   Pulse: 61 (!) 50 63   Resp:      Temp:  97.8 F (36.6 C) 97.7 F (36.5 C)   TempSrc:  Oral Oral   SpO2:  94% 96%   Weight:    103.3 kg  Height:        Intake/Output Summary (Last 24 hours) at 04/05/2020 0658 Last data filed at 04/05/2020 0305 Gross per 24 hour  Intake 904.57 ml  Output 500 ml  Net 404.57 ml   I/O  + 951 cc  Last 3 Weights 04/05/2020 04/04/2020 04/02/2020  Weight (lbs) 227 lb 11.2 oz 227 lb 9.6 oz 224 lb 8 oz  Weight (kg) 103.284 kg 103.239 kg 101.833 kg      Telemetry    SR   2 burst NSVT (5 and 7 beats)- Personally Reviewed  ECG    No new - Personally Reviewed  Physical Exam   GEN: No acute distress.   Neck: No JVD Cardiac: RRR, no murmurs, rubs, or gallops.  Respiratory: Clear to auscultation bilaterally. GI: Soft, nontender, non-distended  MS: Tr to 1+ edema; No deformity. Neuro:  Nonfocal  Psych: Normal affect   Labs    High Sensitivity Troponin:   Recent Labs  Lab 03/31/20 1759  03/31/20 2028  TROPONINIHS 16 17      Chemistry Recent Labs  Lab 04/02/20 0231 04/03/20 0548 04/04/20 0541  NA 141 142 141  K 3.3* 3.5 4.8  CL 103 103 104  CO2 28 27 26   GLUCOSE 156* 144* 168*  BUN 20 25* 32*  CREATININE 0.77 0.93 1.07*  CALCIUM 9.4 9.3 9.4  GFRNONAA >60 >60 51*  GFRAA >60 >60 59*  ANIONGAP 10 12 11      Hematology Recent Labs  Lab 03/31/20 1759 04/02/20 0231  WBC 5.8 7.0  RBC 4.81 4.82  HGB 13.7 13.9  HCT 43.8 42.9  MCV 91.1 89.0  MCH 28.5 28.8  MCHC 31.3 32.4  RDW 13.9 13.7  PLT 228 214    BNP Recent Labs  Lab 04/01/20 0924  BNP 180.8*     DDimer No results for input(s): DDIMER in the last 168 hours.   Radiology    ECHO TEE  Result Date: 04/04/2020    TRANSESOPHOGEAL ECHO REPORT   Patient Name:   Melissa Bishop Date of Exam: 04/04/2020 Medical Rec #:  [id number]  Height:       71.0 in Accession #:    [id number]    Weight:       227.6 lb Date of Birth:  1945-10-20     BSA:          2.228 m Patient Age:    12 years      BP:           95/64 mmHg Patient Gender: F             HR:           134 bpm. Exam Location:  Inpatient Procedure: Transesophageal Echo Indications:     427.31 atrial fibrillation  History:         Patient has prior history of Echocardiogram examinations, most                  recent 04/01/2020. Risk Factors:Hypertension, Diabetes,                  Dyslipidemia and Former Smoker.  Sonographer:     Jannett Celestine RDCS (AE) Referring Phys:  [id number] MARK C SKAINS Diagnosing Phys: [name] MD PROCEDURE: The transesophogeal probe was passed without difficulty through the esophogus of the patient. Sedation performed by different physician. The patient's vital signs; including heart rate, blood pressure, and oxygen saturation; remained stable throughout the procedure. The patient developed no complications during the procedure. IMPRESSIONS  1. Left ventricular ejection fraction, by estimation, is 20 to 25%. The left ventricle has severely  decreased function. The left ventricle has no regional wall motion abnormalities.  2. Right ventricular systolic function is normal. The right ventricular size is normal.  3. Left atrial size was moderately dilated. No left atrial/left atrial appendage thrombus was detected.  4. The mitral valve is normal in structure. Mild to moderate mitral valve regurgitation. No evidence of mitral stenosis.  5. Tricuspid valve regurgitation is mild to moderate.  6. The aortic valve is normal in structure. Aortic valve regurgitation is not visualized. No aortic stenosis is present.  7. The inferior vena cava is normal in size with greater than 50% respiratory variability, suggesting right atrial pressure of 3 mmHg. FINDINGS  Left Ventricle: Left ventricular ejection fraction, by estimation, is 20 to 25%. The left ventricle has severely decreased function. The left ventricle has no regional wall motion abnormalities. The left ventricular internal cavity size was normal in size. There is no left ventricular hypertrophy. Right Ventricle: The right ventricular size is normal. No increase in right ventricular wall thickness. Right ventricular systolic function is normal. Left Atrium: Left atrial size was moderately dilated. No left atrial/left atrial appendage thrombus was detected. Right Atrium: Right atrial size was normal in size. Pericardium: There is no evidence of pericardial effusion. Mitral Valve: The mitral valve is normal in structure. Normal mobility of the mitral valve leaflets. Mild to moderate mitral valve regurgitation. No evidence of mitral valve stenosis. Tricuspid Valve: The tricuspid valve is normal in structure. Tricuspid valve regurgitation is mild to moderate. No evidence of tricuspid stenosis. Aortic Valve: The aortic valve is normal in structure. Aortic valve regurgitation is not visualized. No aortic stenosis is present. Pulmonic Valve: The pulmonic valve was normal in structure. Pulmonic valve regurgitation  is not visualized. No evidence of pulmonic stenosis. Aorta: The aortic root is normal in size and structure. Venous: The inferior vena cava is normal in size with greater than 50% respiratory variability, suggesting right atrial pressure of 3 mmHg. IAS/Shunts: No atrial  level shunt detected by color flow Doppler. [name] MD Electronically signed by [name] MD Signature Date/Time: 04/04/2020/12:38:34 PM    Final (Updated)     Cardiac Studies   TTE: 04/01/2020    1. Left ventricular ejection fraction, by estimation, is 25%. The left  ventricle has severely decreased function. Global hypokinesis. Left  ventricular endocardial border not optimally defined to evaluate regional  wall motion despite the use of Definity  contrast. Left ventricular diastolic parameters are indeterminate.   2. Right ventricular systolic function is severely reduced. The right  ventricular size is mildly enlarged. There is mildly elevated pulmonary  artery systolic pressure. The estimated right ventricular systolic  pressure is 63.8 mmHg.   3. Left atrial size was mildly dilated.   4. Right atrial size was mildly dilated.   5. The mitral valve is grossly normal. No evidence of mitral valve  regurgitation.   6. The aortic valve is grossly normal. Aortic valve regurgitation is not  visualized.   7. The inferior vena cava is dilated in size with <50% respiratory  variability, suggesting right atrial pressure of 15 mmHg.     04/04/20  TEE  Findings:   Left Ventricle: 20-25% EF   Mitral Valve: Mild to moderate MR   Aortic Valve: No AI   Tricuspid Valve: Mild TR   Left Atrium: No LAA thrombus   DCCV  04/04/20 Successful cardioversion of atrial fibrillation/flutter with one DCCV 120J  Patient Profile     74 y.o. female with history of diabetes, psoriasis, hypertension, atrial fibrillation in the past on Eliquis who was admitted on 04/01/2020 for acute decompensated systolic heart failure and atrial  fibrillation with RVR.  Assessment & Plan     Atrial fib  Pt is s/p cardioversion yesterday  Maintaining SR    Will switch amiodarone to 200 bid    Continue Eliquis     Systolic CHF   New   LVEf 25 to 30%   May be related to afib with RVR BP this am soft  (87/)    Overall I/O do not sugg pt has diuresed   Pt still with sl pitting edema (legs always with some swelling she says, bigger now)  For now with low BP would hold lasix   Stop spironolactone   Cut back on lisinopril   Cut metoprolol to toprol XL        Check BNP     Follow I/O   HTN  BP low this am   Adjustments made     DM-2 per IM   CAD per CT scan of chest --There is aortic atherosclerosis as well as foci of coronary artery calcification.   On lipitor now   Lipids will need to be followed     For questions or updates, please contact [place] Please consult [email or web address] for contact info under     Signed, Dorris Carnes, MD  04/05/2020, 6:58 AM

## 2020-04-05 NOTE — Progress Notes (Signed)
PROGRESS NOTE    Almeter Westhoff  NTZ:[id number]  DOB: [birth year]  DOA: 03/31/2020 PCP: [name], MD Outpatient Specialists:   Hospital course:  74 year old female with HTN, HL, DM 2 and psoriasis was admitted on 04/01/2020 with progressive DOE.  Work-up revealed new onset A. fib with RVR and chest x-ray showed bilateral opacities with bilateral pleural effusions.  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  Patient was seen by cardiology who has started patient on Lasix and Eliquis but times a rate control were difficult.  Patient underwent DCCV cardioversion on 04/04/2020 and is presently in sinus rhythm.  Subjective:  Patient without any new complaints. States she cannot tell that she is no longer in atrial fibrillation. She notes that her blood pressure was low but does not feel dizzy. Has not walked around very much so is not sure if she is short of breath. Note she still has swelling in her legs. Thinks that she is at baseline but is not sure.  Objective: Vitals:   04/05/20 0838 04/05/20 1146 04/05/20 1641 04/05/20 1644  BP: (!) 87/53   118/66  Pulse: 66     Resp: 17     Temp:  97.7 F (36.5 C) 97.6 F (36.4 C)   TempSrc:  Oral Oral   SpO2: 97%     Weight:      Height:        Intake/Output Summary (Last 24 hours) at 04/05/2020 1721 Last data filed at 04/05/2020 1312 Gross per 24 hour  Intake 874.57 ml  Output 50 ml  Net 824.57 ml   Filed Weights   04/02/20 0535 04/04/20 0512 04/05/20 0501  Weight: 101.8 kg 103.2 kg 103.3 kg     Exam:  General: Appearing stated age sitting on bedside commode in no acute distress. Able to speak in full sentences without difficulty. No apparent tachypnea. Eyes: sclera anicteric, conjuctiva mild injection bilaterally CVS: S1-S2, regular  Respiratory: She does have decreased breath sounds at bases with rales above it bilaterally.   GI: NABS, soft, NT    LE: 2-3+ edema bilaterally.  Neuro: A/O x 3, Moving all extremities equally with normal strength, CN 3-12 intact, grossly nonfocal.  Psych: patient is logical and coherent, judgement and insight appear normal, mood and affect appropriate to situation.   Assessment & Plan:  Hypotension plicating treatment of acute on chronic systolic heart failure Treatment of patient's heart failure has been limited by hypotension with seems to be asymptomatic Patient is being closely followed by cardiology who've made multiple medication changes including discontinuing spironolactone and decreasing lisinopril. Lasix is being held. We'll need to see what blood pressures with medication changes initiated today by cardiology. Echocardiogram shows new LVEF of 25 to 30%.   New onset atrial fibrillation with RVR Patient underwent DCCV cardioversion today and is presently in sinus rhythm. Previous attempts at rate control of A. fib was difficult. She continues on p.o. amiodarone and metoprolol per cardiology  Acute worsening of kidney function Creatinine is increasing likely secondary to aggressive diuresis Creatinine continues to increase modestly, today is 1.2, despite holding of Lasix over the past 24 hours. Very possibly secondary to relatively low blood pressure and possibly low perfusion of kidneys in setting of lisinopril. Hopefully kidneys will improve with approved blood pressure medication changes  DM2 Sugars somewhat labile but reasonably controlled on SSI AC at bedtime and carb controlled diet   DVT  prophylaxis: Apixaban Code Status: Full Family Communication: No family at bedside Disposition Plan:   Patient is from: Home  Anticipated Discharge Location: Home  Barriers to Discharge: Status post cardioversion today  Is patient medically stable for Discharge: Not yet   Consultants:  Cardiology  Procedures:  Status post DCCV today 04/04/2020  Antimicrobials:  None   Data  Reviewed:  Basic Metabolic Panel: Recent Labs  Lab 03/31/20 1759 04/01/20 0923 04/02/20 0231 04/03/20 0548 04/04/20 0541 04/05/20 0632  NA 142  --  141 142 141 140  K 4.0  --  3.3* 3.5 4.8 4.1  CL 106  --  103 103 104 102  CO2 21*  --  28 27 26 24   GLUCOSE 150*  --  156* 144* 168* 245*  BUN 23  --  20 25* 32* 35*  CREATININE 0.69  --  0.77 0.93 1.07* 1.19*  CALCIUM 9.3  --  9.4 9.3 9.4 9.4  MG  --  2.0  --   --   --   --    Liver Function Tests: No results for input(s): AST, ALT, ALKPHOS, BILITOT, PROT, ALBUMIN in the last 168 hours. No results for input(s): LIPASE, AMYLASE in the last 168 hours. No results for input(s): AMMONIA in the last 168 hours. CBC: Recent Labs  Lab 03/31/20 1759 04/02/20 0231  WBC 5.8 7.0  HGB 13.7 13.9  HCT 43.8 42.9  MCV 91.1 89.0  PLT 228 214   Cardiac Enzymes: No results for input(s): CKTOTAL, CKMB, CKMBINDEX, TROPONINI in the last 168 hours. BNP (last 3 results) No results for input(s): PROBNP in the last 8760 hours. CBG: Recent Labs  Lab 04/04/20 1631 04/04/20 2131 04/05/20 0618 04/05/20 1148 04/05/20 1627  GLUCAP 131* 143* 267* 77 113*    Recent Results (from the past 240 hour(s))  SARS Coronavirus 2 by RT PCR (hospital order, performed in [facility] hospital lab) Nasopharyngeal Nasopharyngeal Swab     Status: None   Collection Time: 04/01/20  5:49 AM   Specimen: Nasopharyngeal Swab  Result Value Ref Range Status   SARS Coronavirus 2 NEGATIVE NEGATIVE Final    Comment: (NOTE) SARS-CoV-2 target nucleic acids are NOT DETECTED.  The SARS-CoV-2 RNA is generally detectable in upper and lower respiratory specimens during the acute phase of infection. The lowest concentration of SARS-CoV-2 viral copies this assay can detect is 250 copies / mL. A negative result does not preclude SARS-CoV-2 infection and should not be used as the sole basis for treatment or other patient management decisions.  A negative result may occur  with improper specimen collection / handling, submission of specimen other than nasopharyngeal swab, presence of viral mutation(s) within the areas targeted by this assay, and inadequate number of viral copies (<250 copies / mL). A negative result must be combined with clinical observations, patient history, and epidemiological information.  Fact Sheet for Patients:   StrictlyIdeas.no  Fact Sheet for Healthcare Providers: BankingDealers.co.za  This test is not yet approved or  cleared by the [place] FDA and has been authorized for detection and/or diagnosis of SARS-CoV-2 by FDA under an Emergency Use Authorization (EUA).  This EUA will remain in effect (meaning this test can be used) for the duration of the COVID-19 declaration under Section 564(b)(1) of the Act, 21 U.S.C. section 360bbb-3(b)(1), unless the authorization is terminated or revoked sooner.  Performed at [facility], [place] [address] [place], [place] [address]       Studies: ECHO TEE  Result Date: 04/04/2020    TRANSESOPHOGEAL ECHO REPORT   Patient Name:   GEORGEANN BRINKMAN Date of Exam: 04/04/2020 Medical Rec #:  [id number]     Height:       71.0 in Accession #:    [id number]    Weight:       227.6 lb Date of Birth:  1945-10-16     BSA:          2.228 m Patient Age:    32 years      BP:           95/64 mmHg Patient Gender: F             HR:           134 bpm. Exam Location:  Inpatient Procedure: Transesophageal Echo Indications:     427.31 atrial fibrillation  History:         Patient has prior history of Echocardiogram examinations, most                  recent 04/01/2020. Risk Factors:Hypertension, Diabetes,                  Dyslipidemia and Former Smoker.  Sonographer:     Jannett Celestine RDCS (AE) Referring Phys:  [id number] MARK C SKAINS Diagnosing Phys: [name] MD PROCEDURE: The transesophogeal probe was passed without difficulty through the esophogus of the patient.  Sedation performed by different physician. The patient's vital signs; including heart rate, blood pressure, and oxygen saturation; remained stable throughout the procedure. The patient developed no complications during the procedure. IMPRESSIONS  1. Left ventricular ejection fraction, by estimation, is 20 to 25%. The left ventricle has severely decreased function. The left ventricle has no regional wall motion abnormalities.  2. Right ventricular systolic function is normal. The right ventricular size is normal.  3. Left atrial size was moderately dilated. No left atrial/left atrial appendage thrombus was detected.  4. The mitral valve is normal in structure. Mild to moderate mitral valve regurgitation. No evidence of mitral stenosis.  5. Tricuspid valve regurgitation is mild to moderate.  6. The aortic valve is normal in structure. Aortic valve regurgitation is not visualized. No aortic stenosis is present.  7. The inferior vena cava is normal in size with greater than 50% respiratory variability, suggesting right atrial pressure of 3 mmHg. FINDINGS  Left Ventricle: Left ventricular ejection fraction, by estimation, is 20 to 25%. The left ventricle has severely decreased function. The left ventricle has no regional wall motion abnormalities. The left ventricular internal cavity size was normal in size. There is no left ventricular hypertrophy. Right Ventricle: The right ventricular size is normal. No increase in right ventricular wall thickness. Right ventricular systolic function is normal. Left Atrium: Left atrial size was moderately dilated. No left atrial/left atrial appendage thrombus was detected. Right Atrium: Right atrial size was normal in size. Pericardium: There is no evidence of pericardial effusion. Mitral Valve: The mitral valve is normal in structure. Normal mobility of the mitral valve leaflets. Mild to moderate mitral valve regurgitation. No evidence of mitral valve stenosis. Tricuspid Valve: The  tricuspid valve is normal in structure. Tricuspid valve regurgitation is mild to moderate. No evidence of tricuspid stenosis. Aortic Valve: The aortic valve is normal in structure. Aortic valve regurgitation is not visualized. No aortic stenosis is present. Pulmonic Valve: The pulmonic valve was normal in structure. Pulmonic valve regurgitation is not visualized. No evidence of pulmonic stenosis. Aorta: The aortic root is  normal in size and structure. Venous: The inferior vena cava is normal in size with greater than 50% respiratory variability, suggesting right atrial pressure of 3 mmHg. IAS/Shunts: No atrial level shunt detected by color flow Doppler. [name] MD Electronically signed by [name] MD Signature Date/Time: 04/04/2020/12:38:34 PM    Final (Updated)      Scheduled Meds: . amiodarone  200 mg Oral BID  . apixaban  5 mg Oral BID  . atorvastatin  80 mg Oral Daily  . insulin aspart  0-15 Units Subcutaneous TID WC  . insulin aspart  0-5 Units Subcutaneous QHS  . lisinopril  2.5 mg Oral Daily  . metoprolol succinate  25 mg Oral BID WC  . sodium chloride flush  3 mL Intravenous Q12H   Continuous Infusions: . sodium chloride    . sodium chloride 20 mL/hr at 04/05/20 7893    Principal Problem:   Atrial fibrillation with rapid ventricular response (HCC) Active Problems:   Psoriasis   Constipation   Essential hypertension   Diabetes mellitus type II, non insulin dependent ([place])   Dyspnea   Acute systolic heart failure ([place])     Alaine Loughney Derek Jack, [facility]  If 7PM-7AM, please contact night-coverage [email or web address] Password [email or web address] 04/05/2020, 5:21 PM    LOS: 4 days

## 2020-04-05 NOTE — Plan of Care (Signed)
  Problem: Activity: Goal: Risk for activity intolerance will decrease Outcome: Progressing   

## 2020-04-06 LAB — GLUCOSE, CAPILLARY
Glucose-Capillary: 158 mg/dL — ABNORMAL HIGH (ref 70–99)
Glucose-Capillary: 112 mg/dL — ABNORMAL HIGH (ref 70–99)
Glucose-Capillary: 138 mg/dL — ABNORMAL HIGH (ref 70–99)
Glucose-Capillary: 144 mg/dL — ABNORMAL HIGH (ref 70–99)

## 2020-04-06 LAB — BASIC METABOLIC PANEL
Anion gap: 10 (ref 5–15)
BUN: 26 mg/dL — ABNORMAL HIGH (ref 8–23)
CO2: 24 mmol/L (ref 22–32)
Calcium: 9.3 mg/dL (ref 8.9–10.3)
Chloride: 105 mmol/L (ref 98–111)
Creatinine, Ser: 0.91 mg/dL (ref 0.44–1.00)
GFR calc Af Amer: >60 mL/min (ref >60)
GFR calc non Af Amer: >60 mL/min (ref >60)
Glucose, Bld: 189 mg/dL — ABNORMAL HIGH (ref 70–99)
Potassium: 4.2 mmol/L (ref 3.5–5.1)
Sodium: 139 mmol/L (ref 135–145)

## 2020-04-06 MED ORDER — FUROSEMIDE 10 MG/ML IJ SOLN
80.0000 mg | Freq: Once | INTRAMUSCULAR | Status: AC
  Administered 2020-04-06: 80 mg via INTRAVENOUS
  Filled 2020-04-06: qty 8

## 2020-04-06 NOTE — Evaluation (Signed)
Physical Therapy Evaluation Patient Details Name: Melissa Bishop MRN: [id number] DOB: [birth year] Today's Date: 04/06/2020   History of Present Illness  74 y.o. female with history of diabetes, psoriasis, hypertension, atrial fibrillation in the past on Eliquis who was admitted on 04/01/2020 for acute decompensated systolic heart failure and atrial fibrillation with RVR.  Clinical Impression  Pt admitted with above diagnosis. Pt was able to ambulate with RW with min guard assist with no LOB.  Should progress well.   Pt currently with functional limitations due to the deficits listed below (see PT Problem List). Pt will benefit from skilled PT to increase their independence and safety with mobility to allow discharge to the venue listed below.      Follow Up Recommendations Home health PT;Supervision - Intermittent    Equipment Recommendations  None recommended by PT    Recommendations for Other Services       Precautions / Restrictions Precautions Precautions: Fall Restrictions Weight Bearing Restrictions: No      Mobility  Bed Mobility Overal bed mobility: Independent                Transfers Overall transfer level: Needs assistance Equipment used: Rolling walker (2 wheeled) Transfers: Sit to/from Stand Sit to Stand: Min guard            Ambulation/Gait Ambulation/Gait assistance: Min Gaffer (Feet): 350 Feet Assistive device: Rolling walker (2 wheeled) Gait Pattern/deviations: Step-through pattern;Decreased stride length   Gait velocity interpretation: <1.31 ft/sec, indicative of household ambulator General Gait Details: Pt was able to ambulate with RW wtih good safety.    Stairs            Wheelchair Mobility    Modified Rankin (Stroke Patients Only)       Balance Overall balance assessment: Needs assistance Sitting-balance support: No upper extremity supported;Feet supported Sitting balance-Leahy Scale: Fair      Standing balance support: Bilateral upper extremity supported;During functional activity Standing balance-Leahy Scale: Fair Standing balance comment: can stand statically wihtout UE support                             Pertinent Vitals/Pain Pain Assessment: No/denies pain    Home Living Family/patient expects to be discharged to:: Private residence Living Arrangements: Parent (lives with mom) Available Help at Discharge: Family;Available PRN/intermittently Type of Home: House Home Access: Ramped entrance     Home Layout: One level Home Equipment: Walker - 2 wheels Additional Comments: Helps her mom cook and take care of cat    Prior Function Level of Independence: Independent with assistive device(s)         Comments: Got around with RW without assist prior to admit.  I with ADLS     Hand Dominance   Dominant Hand: Right    Extremity/Trunk Assessment   Upper Extremity Assessment Upper Extremity Assessment: Defer to OT evaluation    Lower Extremity Assessment Lower Extremity Assessment: Generalized weakness    Cervical / Trunk Assessment Cervical / Trunk Assessment: Normal  Communication   Communication: No difficulties  Cognition Arousal/Alertness: Awake/alert Behavior During Therapy: WFL for tasks assessed/performed Overall Cognitive Status: Within Functional Limits for tasks assessed                                        General Comments General comments (skin integrity, edema, etc.): VSS  Exercises     Assessment/Plan    PT Assessment Patient needs continued PT services  PT Problem List Decreased balance;Decreased activity tolerance;Decreased mobility;Decreased knowledge of use of DME;Decreased safety awareness;Decreased knowledge of precautions;Cardiopulmonary status limiting activity       PT Treatment Interventions DME instruction;Gait training;Therapeutic activities;Therapeutic exercise;Balance  training;Patient/family education;Functional mobility training    PT Goals (Current goals can be found in the Care Plan section)  Acute Rehab PT Goals Patient Stated Goal: to go home PT Goal Formulation: With patient Time For Goal Achievement: 04/20/20 Potential to Achieve Goals: Good    Frequency Min 3X/week   Barriers to discharge        Co-evaluation               AM-PAC PT "6 Clicks" Mobility  Outcome Measure Help needed turning from your back to your side while in a flat bed without using bedrails?: None Help needed moving from lying on your back to sitting on the side of a flat bed without using bedrails?: None Help needed moving to and from a bed to a chair (including a wheelchair)?: None Help needed standing up from a chair using your arms (e.g., wheelchair or bedside chair)?: None Help needed to walk in hospital room?: A Little Help needed climbing 3-5 steps with a railing? : A Little 6 Click Score: 22    End of Session Equipment Utilized During Treatment: Gait belt Activity Tolerance: Patient limited by fatigue Patient left: in chair;with call bell/phone within reach;with chair alarm set Nurse Communication: Mobility status PT Visit Diagnosis: Muscle weakness (generalized) (M62.81);Unsteadiness on feet (R26.81)    Time: 1035-1100 PT Time Calculation (min) (ACUTE ONLY): 25 min   Charges:   PT Evaluation $PT Eval Moderate Complexity: 1 Mod PT Treatments $Gait Training: 8-22 mins        Tip Atienza W,PT Acute Rehabilitation Services Pager:  [phone]  Office:  [place] 04/06/2020, 1:21 PM

## 2020-04-06 NOTE — Progress Notes (Signed)
[facility] PROGRESS NOTE    Progress Note  Melissa Bishop  HRC:[id number] DOB: [birth year] DOA: 03/31/2020 PCP: [name], MD     Brief Narrative:   Melissa Bishop is an 74 y.o. female past medical history of essential hypertension hyperlipidemia diabetes mellitus type 2 psoriasis was admitted on 05/02/2020 with progressive dyspnea on exertion, was found to have been in A. fib with RVR chest x-ray showing bilateral opacities and effusion, 2D echo showed an EF of 25% with pulmonary hypertension.  Cardiology was consulted who started IV Lasix and Eliquis.  Patient underwent DCCV on 04/04/2020.  Assessment/Plan:   Newly diagnosed acute on chronic systolic heart failure: With an EF of 20 to 25% on 04/01/2020. TEE showing an EF of 25%, [facility] cardioversion on 04/04/2020. Cardiology is following closely Aldactone has been discontinued lisinopril has been DC Lasix was held, due to an episode of hypotension. Currently on low-dose lisinopril and metoprolol  New onset atrial fibrillation with rapid ventricular response ([place]) Underwent DCCV as above, she is currently on amiodarone and metoprolol. She is rate controlled in sinus rhythm on Eliquis.  Diabetes mellitus type 2: Continue sliding scale insulin.  Constipation Continue MiraLAX as needed.  Essential hypertension Blood pressure is essentially well controlled.   DVT prophylaxis: eliquis Family Communication:none Status is: Inpatient  Remains inpatient appropriate because:Hemodynamically unstable   Dispo: The patient is from: Home              Anticipated d/c is to: Home              Anticipated d/c date is: 2 days              Patient currently is not medically stable to d/c.        Code Status:     Code Status Orders  (From admission, onward)         Start     Ordered   04/01/20 0818  Full code  Continuous        04/01/20 0820        Code Status History    This patient has a current code status but no  historical code status.   Advance Care Planning Activity        IV Access:    Peripheral IV   Procedures and diagnostic studies:   ECHO TEE  Result Date: 04/04/2020    TRANSESOPHOGEAL ECHO REPORT   Patient Name:   Melissa Bishop Date of Exam: 04/04/2020 Medical Rec #:  [id number]     Height:       71.0 in Accession #:    [id number]    Weight:       227.6 lb Date of Birth:  03-06-1946     BSA:          2.228 m Patient Age:    68 years      BP:           95/64 mmHg Patient Gender: F             HR:           134 bpm. Exam Location:  Inpatient Procedure: Transesophageal Echo Indications:     427.31 atrial fibrillation  History:         Patient has prior history of Echocardiogram examinations, most                  recent 04/01/2020. Risk Factors:Hypertension, Diabetes,  Dyslipidemia and Former Smoker.  Sonographer:     Jannett Celestine RDCS (AE) Referring Phys:  [id number] MARK C SKAINS Diagnosing Phys: [name] MD PROCEDURE: The transesophogeal probe was passed without difficulty through the esophogus of the patient. Sedation performed by different physician. The patient's vital signs; including heart rate, blood pressure, and oxygen saturation; remained stable throughout the procedure. The patient developed no complications during the procedure. IMPRESSIONS  1. Left ventricular ejection fraction, by estimation, is 20 to 25%. The left ventricle has severely decreased function. The left ventricle has no regional wall motion abnormalities.  2. Right ventricular systolic function is normal. The right ventricular size is normal.  3. Left atrial size was moderately dilated. No left atrial/left atrial appendage thrombus was detected.  4. The mitral valve is normal in structure. Mild to moderate mitral valve regurgitation. No evidence of mitral stenosis.  5. Tricuspid valve regurgitation is mild to moderate.  6. The aortic valve is normal in structure. Aortic valve regurgitation is not visualized. No  aortic stenosis is present.  7. The inferior vena cava is normal in size with greater than 50% respiratory variability, suggesting right atrial pressure of 3 mmHg. FINDINGS  Left Ventricle: Left ventricular ejection fraction, by estimation, is 20 to 25%. The left ventricle has severely decreased function. The left ventricle has no regional wall motion abnormalities. The left ventricular internal cavity size was normal in size. There is no left ventricular hypertrophy. Right Ventricle: The right ventricular size is normal. No increase in right ventricular wall thickness. Right ventricular systolic function is normal. Left Atrium: Left atrial size was moderately dilated. No left atrial/left atrial appendage thrombus was detected. Right Atrium: Right atrial size was normal in size. Pericardium: There is no evidence of pericardial effusion. Mitral Valve: The mitral valve is normal in structure. Normal mobility of the mitral valve leaflets. Mild to moderate mitral valve regurgitation. No evidence of mitral valve stenosis. Tricuspid Valve: The tricuspid valve is normal in structure. Tricuspid valve regurgitation is mild to moderate. No evidence of tricuspid stenosis. Aortic Valve: The aortic valve is normal in structure. Aortic valve regurgitation is not visualized. No aortic stenosis is present. Pulmonic Valve: The pulmonic valve was normal in structure. Pulmonic valve regurgitation is not visualized. No evidence of pulmonic stenosis. Aorta: The aortic root is normal in size and structure. Venous: The inferior vena cava is normal in size with greater than 50% respiratory variability, suggesting right atrial pressure of 3 mmHg. IAS/Shunts: No atrial level shunt detected by color flow Doppler. [name] MD Electronically signed by [name] MD Signature Date/Time: 04/04/2020/12:38:34 PM    Final (Updated)      Medical Consultants:    None.  Anti-Infectives:   none  Subjective:    [name] she  relates her symptoms are improved, her dyspnea is resolved she feels better.  Objective:    Vitals:   04/05/20 1641 04/05/20 1644 04/05/20 1943 04/06/20 0446  BP:  118/66 126/80 (!) 137/98  Pulse:   (!) 56 63  Resp:   12 20  Temp: 97.6 F (36.4 C)  (!) 97.3 F (36.3 C) 97.8 F (36.6 C)  TempSrc: Oral  Oral Oral  SpO2:   96% 96%  Weight:    104.1 kg  Height:       SpO2: 96 % O2 Flow Rate (L/min): 6 L/min   Intake/Output Summary (Last 24 hours) at 04/06/2020 0731 Last data filed at 04/06/2020 0447 Gross per 24 hour  Intake 760 ml  Output 300 ml  Net 460 ml   Filed Weights   04/04/20 0512 04/05/20 0501 04/06/20 0446  Weight: 103.2 kg 103.3 kg 104.1 kg    Exam: General exam: In no acute distress. Respiratory system: Good air movement and clear to auscultation. Cardiovascular system: S1 & S2 heard, RRR. No JVD. Gastrointestinal system: Abdomen is nondistended, soft and nontender.  Extremities: No pedal edema. Skin: No rashes, lesions or ulcers Psychiatry: Judgement and insight appear normal. Mood & affect appropriate.    Data Reviewed:    Labs: Basic Metabolic Panel: Recent Labs  Lab 03/31/20 1759 03/31/20 1759 04/01/20 1937 04/02/20 0231 04/02/20 0231 04/03/20 9024 04/03/20 0548 04/04/20 0541 04/05/20 0973  NA 142  --   --  141  --  142  --  141 140  K 4.0   < >  --  3.3*   < > 3.5   < > 4.8 4.1  CL 106  --   --  103  --  103  --  104 102  CO2 21*  --   --  28  --  27  --  26 24  GLUCOSE 150*  --   --  156*  --  144*  --  168* 245*  BUN 23  --   --  20  --  25*  --  32* 35*  CREATININE 0.69  --   --  0.77  --  0.93  --  1.07* 1.19*  CALCIUM 9.3  --   --  9.4  --  9.3  --  9.4 9.4  MG  --   --  2.0  --   --   --   --   --   --    < > = values in this interval not displayed.   GFR Estimated Creatinine Clearance: 55.1 mL/min (A) (by C-G formula based on SCr of 1.19 mg/dL (H)). Liver Function Tests: No results for input(s): AST, ALT, ALKPHOS, BILITOT,  PROT, ALBUMIN in the last 168 hours. No results for input(s): LIPASE, AMYLASE in the last 168 hours. No results for input(s): AMMONIA in the last 168 hours. Coagulation profile Recent Labs  Lab 04/04/20 1027  INR 1.3*   COVID-19 Labs  No results for input(s): DDIMER, FERRITIN, LDH, CRP in the last 72 hours.  Lab Results  Component Value Date   [place] NEGATIVE 04/01/2020    CBC: Recent Labs  Lab 03/31/20 1759 04/02/20 0231  WBC 5.8 7.0  HGB 13.7 13.9  HCT 43.8 42.9  MCV 91.1 89.0  PLT 228 214   Cardiac Enzymes: No results for input(s): CKTOTAL, CKMB, CKMBINDEX, TROPONINI in the last 168 hours. BNP (last 3 results) No results for input(s): PROBNP in the last 8760 hours. CBG: Recent Labs  Lab 04/05/20 0618 04/05/20 1148 04/05/20 1627 04/05/20 2156 04/06/20 0603  GLUCAP 267* 77 113* 138* 158*   D-Dimer: No results for input(s): DDIMER in the last 72 hours. Hgb A1c: No results for input(s): HGBA1C in the last 72 hours. Lipid Profile: No results for input(s): CHOL, HDL, LDLCALC, TRIG, CHOLHDL, LDLDIRECT in the last 72 hours. Thyroid function studies: No results for input(s): TSH, T4TOTAL, T3FREE, THYROIDAB in the last 72 hours.  Invalid input(s): FREET3 Anemia work up: No results for input(s): VITAMINB12, FOLATE, FERRITIN, TIBC, IRON, RETICCTPCT in the last 72 hours. Sepsis Labs: Recent Labs  Lab 03/31/20 1759 04/01/20 0923 04/02/20 0231  PROCALCITON  --  <0.10  --   WBC 5.8  --  7.0   Microbiology Recent Results (from the past 240 hour(s))  SARS Coronavirus 2 by RT PCR (hospital order, performed in [facility] hospital lab) Nasopharyngeal Nasopharyngeal Swab     Status: None   Collection Time: 04/01/20  5:49 AM   Specimen: Nasopharyngeal Swab  Result Value Ref Range Status   SARS Coronavirus 2 NEGATIVE NEGATIVE Final    Comment: (NOTE) SARS-CoV-2 target nucleic acids are NOT DETECTED.  The SARS-CoV-2 RNA is generally detectable in upper and  lower respiratory specimens during the acute phase of infection. The lowest concentration of SARS-CoV-2 viral copies this assay can detect is 250 copies / mL. A negative result does not preclude SARS-CoV-2 infection and should not be used as the sole basis for treatment or other patient management decisions.  A negative result may occur with improper specimen collection / handling, submission of specimen other than nasopharyngeal swab, presence of viral mutation(s) within the areas targeted by this assay, and inadequate number of viral copies (<250 copies / mL). A negative result must be combined with clinical observations, patient history, and epidemiological information.  Fact Sheet for Patients:   StrictlyIdeas.no  Fact Sheet for Healthcare Providers: BankingDealers.co.za  This test is not yet approved or  cleared by the [place] FDA and has been authorized for detection and/or diagnosis of SARS-CoV-2 by FDA under an Emergency Use Authorization (EUA).  This EUA will remain in effect (meaning this test can be used) for the duration of the COVID-19 declaration under Section 564(b)(1) of the Act, 21 U.S.C. section 360bbb-3(b)(1), unless the authorization is terminated or revoked sooner.  Performed at [facility], [place] [address] [place], [place] [address]      Medications:   . amiodarone  200 mg Oral BID  . apixaban  5 mg Oral BID  . atorvastatin  80 mg Oral Daily  . insulin aspart  0-15 Units Subcutaneous TID WC  . insulin aspart  0-5 Units Subcutaneous QHS  . lisinopril  2.5 mg Oral Daily  . metoprolol succinate  25 mg Oral BID WC  . sodium chloride flush  3 mL Intravenous Q12H   Continuous Infusions: . sodium chloride    . sodium chloride 20 mL/hr at 04/05/20 0305      LOS: 5 days   Charlynne Cousins  [facility]  04/06/2020, 7:31 AM

## 2020-04-06 NOTE — TOC Benefit Eligibility Note (Signed)
Transition of Care [facility]) Benefit Eligibility Note    Patient Details  Name: Melissa Bishop MRN: [id number] Date of Birth: 01-Sep-1945   Medication/Dose: ELIQUIS  2.5 MG BID    and   ELIQUIS 5 MG BID  Covered?: Yes  Tier: 3 Drug  Prescription Coverage Preferred Pharmacy: [facility] with Person/Company/Phone Number:: PEGGY  @ [employer] # [phone]  Co-Pay: $ 24.62  FOR EACH PRESCRIPTION  Prior Approval: No  Deductible: Met  Additional Notes: APIXABAN : Crecencio Mc Phone Number: 04/06/2020, 4:59 PM

## 2020-04-06 NOTE — Plan of Care (Signed)
  Problem: Clinical Measurements: Goal: Will remain free from infection Outcome: Progressing   Problem: Activity: Goal: Risk for activity intolerance will decrease Outcome: Progressing   

## 2020-04-06 NOTE — Progress Notes (Addendum)
Progress Note  Patient Name: Melissa Bishop Date of Encounter: 04/06/2020  Primary Cardiologist: Evalina Field, MD   Subjective   Feeling well today with no specific complaints.   Inpatient Medications    Scheduled Meds: . amiodarone  200 mg Oral BID  . apixaban  5 mg Oral BID  . atorvastatin  80 mg Oral Daily  . insulin aspart  0-15 Units Subcutaneous TID WC  . insulin aspart  0-5 Units Subcutaneous QHS  . lisinopril  2.5 mg Oral Daily  . metoprolol succinate  25 mg Oral BID WC  . sodium chloride flush  3 mL Intravenous Q12H   Continuous Infusions: . sodium chloride    . sodium chloride 20 mL/hr at 04/05/20 0305   PRN Meds: sodium chloride, acetaminophen, ondansetron (ZOFRAN) IV, sodium chloride flush   Vital Signs    Vitals:   04/05/20 1641 04/05/20 1644 04/05/20 1943 04/06/20 0446  BP:  118/66 126/80 (!) 137/98  Pulse:   (!) 56 63  Resp:   12 20  Temp: 97.6 F (36.4 C)  (!) 97.3 F (36.3 C) 97.8 F (36.6 C)  TempSrc: Oral  Oral Oral  SpO2:   96% 96%  Weight:    104.1 kg  Height:        Intake/Output Summary (Last 24 hours) at 04/06/2020 0735 Last data filed at 04/06/2020 0447 Gross per 24 hour  Intake 760 ml  Output 300 ml  Net 460 ml   Filed Weights   04/04/20 0512 04/05/20 0501 04/06/20 0446  Weight: 103.2 kg 103.3 kg 104.1 kg    Physical Exam   General: Elderly, NAD Neck: Negative for carotid bruits. No JVD Lungs:Clear to ausculation bilaterally. Breathing is unlabored. Cardiovascular: RRR with S1 S2. No murmurs Abdomen: Soft, non-tender, non-distended. No obvious abdominal masses. Extremities: 1-2+ BLE edema. Radial pulses 2+ bilaterally Neuro: Alert and oriented. No focal deficits. No facial asymmetry. MAE spontaneously. Psych: Responds to questions appropriately with normal affect.    Labs    Chemistry Recent Labs  Lab 04/03/20 0548 04/04/20 0541 04/05/20 0632  NA 142 141 140  K 3.5 4.8 4.1  CL 103 104 102  CO2 27 26 24     GLUCOSE 144* 168* 245*  BUN 25* 32* 35*  CREATININE 0.93 1.07* 1.19*  CALCIUM 9.3 9.4 9.4  GFRNONAA >60 51* 45*  GFRAA >60 59* 52*  ANIONGAP 12 11 14      Hematology Recent Labs  Lab 03/31/20 1759 04/02/20 0231  WBC 5.8 7.0  RBC 4.81 4.82  HGB 13.7 13.9  HCT 43.8 42.9  MCV 91.1 89.0  MCH 28.5 28.8  MCHC 31.3 32.4  RDW 13.9 13.7  PLT 228 214    Cardiac EnzymesNo results for input(s): TROPONINI in the last 168 hours. No results for input(s): TROPIPOC in the last 168 hours.   BNP Recent Labs  Lab 04/01/20 0924 04/05/20 0949  BNP 180.8* 57.9     DDimer No results for input(s): DDIMER in the last 168 hours.   Radiology    ECHO TEE  Result Date: 04/04/2020    TRANSESOPHOGEAL ECHO REPORT   Patient Name:   Melissa Bishop Date of Exam: 04/04/2020 Medical Rec #:  [id number]     Height:       71.0 in Accession #:    [id number]    Weight:       227.6 lb Date of Birth:  09/25/1945     BSA:  2.228 m Patient Age:    74 years      BP:           95/64 mmHg Patient Gender: F             HR:           134 bpm. Exam Location:  Inpatient Procedure: Transesophageal Echo Indications:     427.31 atrial fibrillation  History:         Patient has prior history of Echocardiogram examinations, most                  recent 04/01/2020. Risk Factors:Hypertension, Diabetes,                  Dyslipidemia and Former Smoker.  Sonographer:     Jannett Celestine RDCS (AE) Referring Phys:  [id number] MARK C SKAINS Diagnosing Phys: [name] MD PROCEDURE: The transesophogeal probe was passed without difficulty through the esophogus of the patient. Sedation performed by different physician. The patient's vital signs; including heart rate, blood pressure, and oxygen saturation; remained stable throughout the procedure. The patient developed no complications during the procedure. IMPRESSIONS  1. Left ventricular ejection fraction, by estimation, is 20 to 25%. The left ventricle has severely decreased function. The left  ventricle has no regional wall motion abnormalities.  2. Right ventricular systolic function is normal. The right ventricular size is normal.  3. Left atrial size was moderately dilated. No left atrial/left atrial appendage thrombus was detected.  4. The mitral valve is normal in structure. Mild to moderate mitral valve regurgitation. No evidence of mitral stenosis.  5. Tricuspid valve regurgitation is mild to moderate.  6. The aortic valve is normal in structure. Aortic valve regurgitation is not visualized. No aortic stenosis is present.  7. The inferior vena cava is normal in size with greater than 50% respiratory variability, suggesting right atrial pressure of 3 mmHg. FINDINGS  Left Ventricle: Left ventricular ejection fraction, by estimation, is 20 to 25%. The left ventricle has severely decreased function. The left ventricle has no regional wall motion abnormalities. The left ventricular internal cavity size was normal in size. There is no left ventricular hypertrophy. Right Ventricle: The right ventricular size is normal. No increase in right ventricular wall thickness. Right ventricular systolic function is normal. Left Atrium: Left atrial size was moderately dilated. No left atrial/left atrial appendage thrombus was detected. Right Atrium: Right atrial size was normal in size. Pericardium: There is no evidence of pericardial effusion. Mitral Valve: The mitral valve is normal in structure. Normal mobility of the mitral valve leaflets. Mild to moderate mitral valve regurgitation. No evidence of mitral valve stenosis. Tricuspid Valve: The tricuspid valve is normal in structure. Tricuspid valve regurgitation is mild to moderate. No evidence of tricuspid stenosis. Aortic Valve: The aortic valve is normal in structure. Aortic valve regurgitation is not visualized. No aortic stenosis is present. Pulmonic Valve: The pulmonic valve was normal in structure. Pulmonic valve regurgitation is not visualized. No evidence  of pulmonic stenosis. Aorta: The aortic root is normal in size and structure. Venous: The inferior vena cava is normal in size with greater than 50% respiratory variability, suggesting right atrial pressure of 3 mmHg. IAS/Shunts: No atrial level shunt detected by color flow Doppler. [name] MD Electronically signed by [name] MD Signature Date/Time: 04/04/2020/12:38:34 PM    Final (Updated)    Telemetry    04/06/20 NSR HR 60's - Personally Reviewed  ECG    No new  tracing as of 04/06/20- Personally Reviewed  Cardiac Studies   TTE: 04/01/2020  1. Left ventricular ejection fraction, by estimation, is 25%. The left  ventricle has severely decreased function. Global hypokinesis. Left  ventricular endocardial border not optimally defined to evaluate regional  wall motion despite the use of Definity  contrast. Left ventricular diastolic parameters are indeterminate.  2. Right ventricular systolic function is severely reduced. The right  ventricular size is mildly enlarged. There is mildly elevated pulmonary  artery systolic pressure. The estimated right ventricular systolic  pressure is 00.7 mmHg.  3. Left atrial size was mildly dilated.  4. Right atrial size was mildly dilated.  5. The mitral valve is grossly normal. No evidence of mitral valve  regurgitation.  6. The aortic valve is grossly normal. Aortic valve regurgitation is not  visualized.  7. The inferior vena cava is dilated in size with <50% respiratory  variability, suggesting right atrial pressure of 15 mmHg.   04/04/20  TEE  Findings:  Left Ventricle:20-25% EF  Mitral Valve:Mild to moderate MR  Aortic Valve:No AI  Tricuspid Valve:Mild TR  Left Atrium:No LAA thrombus  DCCV  04/04/20 Successful cardioversion of atrial fibrillation/flutter with one DCCV 120J  Patient Profile     74 y.o. female  with history of diabetes, psoriasis, hypertension, atrial fibrillation in the past on Eliquis who  was admitted on 04/01/2020 for acute decompensated systolic heart failure and atrial fibrillation with RVR.  Assessment & Plan    1. Atrial fibrillation: -Underwent TEE/DCCV to NSR yesterday>>maintaining SR    -Continue amiodarone to 200 BID -Continue Eliquis     2. Systolic CHF: -LVEF found to be reduced to 25 to 30% felt to be secondary to AF with RVR  -BNP, 57 -Weight, 229lb  -I&O, net positive 1.3L -Lasix and Arlyce Harman held yesterday due to soft BP -Metoprolol transitioned to Toprol  Give lasix once today and follow response 3. HTN:   -Stable this AM, 137/98>126/80>118/66 -Plan as above   4. DM2: -Per IM    5. CAD: -Aortic atherosclerosis/focal coronary artery calcifications found on CT -Continue atorvastatin   -LDL, 142 -Lipids will need to be followed  6. HLD: -LDL, 142 -Continue atorvastatin as above   Signed, Kathyrn Drown NP-C [facility] Pager: [phone] 04/06/2020, 7:35 AM    Pt seen and examined  I agree with findings as noted by Tonny Branch above  Modified slightly    She remains in SR On exam: Lungs are CTA  Cardiac RRR  Occasional skip  No S3 Ext with 1+ edema (worse than a couple days ago)  Impresison Afib  Remains in SR  Keep on same regine  CHF   VOlume appears increased  Increased LE edema over past couple days   I did not expect with conversion to SR  I wuld give 80 lasix once and follow response  Will continue to follow   Dorris Carnes MD   For questions or updates, please contact   Please consult [email or web address] for contact info under Cardiology/STEMI.

## 2020-04-07 LAB — BASIC METABOLIC PANEL
Anion gap: 9 (ref 5–15)
BUN: 20 mg/dL (ref 8–23)
CO2: 26 mmol/L (ref 22–32)
Calcium: 9.5 mg/dL (ref 8.9–10.3)
Chloride: 106 mmol/L (ref 98–111)
Creatinine, Ser: 0.85 mg/dL (ref 0.44–1.00)
GFR calc Af Amer: >60 mL/min (ref >60)
GFR calc non Af Amer: >60 mL/min (ref >60)
Glucose, Bld: 136 mg/dL — ABNORMAL HIGH (ref 70–99)
Potassium: 3.8 mmol/L (ref 3.5–5.1)
Sodium: 141 mmol/L (ref 135–145)

## 2020-04-07 LAB — GLUCOSE, CAPILLARY
Glucose-Capillary: 126 mg/dL — ABNORMAL HIGH (ref 70–99)
Glucose-Capillary: 128 mg/dL — ABNORMAL HIGH (ref 70–99)
Glucose-Capillary: 143 mg/dL — ABNORMAL HIGH (ref 70–99)
Glucose-Capillary: 132 mg/dL — ABNORMAL HIGH (ref 70–99)

## 2020-04-07 MED ORDER — AMIODARONE HCL IN DEXTROSE 360-4.14 MG/200ML-% IV SOLN
60.0000 mg/h | INTRAVENOUS | Status: AC
  Administered 2020-04-07: 60 mg/h via INTRAVENOUS
  Filled 2020-04-07: qty 200

## 2020-04-07 MED ORDER — AMIODARONE HCL IN DEXTROSE 360-4.14 MG/200ML-% IV SOLN
60.0000 mg/h | INTRAVENOUS | Status: DC
  Administered 2020-04-07 – 2020-04-08 (×2): 30 mg/h via INTRAVENOUS
  Administered 2020-04-08 – 2020-04-09 (×6): 60 mg/h via INTRAVENOUS
  Filled 2020-04-07 (×7): qty 200

## 2020-04-07 NOTE — Plan of Care (Signed)
Nutrition Education Note  RD consulted for nutrition education regarding CHF and diabetes.  Lab Results  Component Value Date   HGBA1C 7.5 (H) 04/01/2020  PTA DM medications are 500 mg metformin BID. Per ADA's Standards of Medical Care for Diabetes, glycemic targets for elderly patients who are otherwise healthy (few medical impairments) and cognitively intact should be less stringent (Hgb A1c <7.5).   Spoke with pt and mother at bedside. Pt reports she is upset because she was told she cannot have bread or potatoes. Per mom, they try to not add salt to the table and use a lot of Mrs Deliah Boston at the table. Pt typically consumes 3 meals per day (Breakfast: peanut butter and banana sandwich; Lunch: bag of light microwave popcorn; Dinner: chicken and fish, potato, and frozen vegetable). Per pt mom, they occasionally eat hot dogs and baked beans about once per month. Pt reports that she has been diagnosed with DM, but does not check CBGS as she does not have a glucometer available to her. She is compliant with her medications.   Focus on education is how how to decrease sodium in diet and substitute more complex carbohydrates (pt uses Margarita Grizzle Wheat bread for sandwiches).   RD provided "Heart Healthy, Consistent Carbohydrate Nutrition Therapy" handout from the Academy of Nutrition and Dietetics. Reviewed patient's dietary recall. Provided examples on ways to decrease sodium intake in diet. Discouraged intake of processed foods and use of salt shaker. Encouraged fresh fruits and vegetables as well as whole grain sources of carbohydrates to maximize fiber intake.   RD discussed why it is important for patient to adhere to diet recommendations, and emphasized the role of fluids, foods to avoid, and importance of weighing self daily.   Discussed different food groups and their effects on blood sugar, emphasizing carbohydrate-containing foods. Provided list of carbohydrates and recommended serving sizes of  common foods.  Discussed importance of controlled and consistent carbohydrate intake throughout the day. Provided examples of ways to balance meals/snacks and encouraged intake of high-fiber, whole grain complex carbohydrates. Teach back method used.  Expect fair to good compliance.  Current diet order is Heart Healthy, patient is consuming approximately 100% of meals at this time. Labs and medications reviewed. No further nutrition interventions warranted at this time. RD contact information provided. If additional nutrition issues arise, please re-consult RD.   Loistine Chance, RD, LDN, [place] Registered Dietitian II Certified Diabetes Care and Education Specialist Please refer to [facility] for RD and/or RD on-call/weekend/after hours pager

## 2020-04-07 NOTE — TOC Benefit Eligibility Note (Signed)
Transition of Care [facility]) Benefit Eligibility Note    Patient Details  Name: Melissa Bishop MRN: [id number] Date of Birth: [birth year]   Medication/Dose: JARDIANCE 10 MG    and     ENTRESTO 24-26 MG BID  COVER-YES  CO-PAY- $30.00 TIER- 3 DRUG  P/A- NO  Covered?: Yes  Tier: 3 Drug  Prescription Coverage [facility]erred [facility] with Person/Company/Phone Number:: ROXANNE and  MAL   @ [employer] # [phone]  Co-Pay: $ 27.06 FOR  JARDIANCE 10 MG  Prior Approval: No  Deductible: Unmet  Additional Notes: FARXIGA  5 MG  DAILY  OR  FARXIGA 10 MG : BOTH  NON-FORMULARY and NOT COVER    Memory Argue Phone Number: 04/07/2020, 11:06 AM

## 2020-04-07 NOTE — Progress Notes (Signed)
[facility] PROGRESS NOTE    Progress Note  Melissa Bishop  QQP:[id number] DOB: Jun 25, 1946 DOA: 03/31/2020 PCP: [name], MD     Brief Narrative:   Melissa Bishop is an 74 y.o. female past medical history of essential hypertension hyperlipidemia diabetes mellitus type 2 psoriasis was admitted on 05/02/2020 with progressive dyspnea on exertion, was found to have been in A. fib with RVR chest x-ray showing bilateral opacities and effusion, 2D echo showed an EF of 25% with pulmonary hypertension.  Cardiology was consulted who started IV Lasix and Eliquis.  Patient underwent DCCV on 04/04/2020.  Assessment/Plan:   Newly diagnosed acute on chronic systolic heart failure: With an EF of 20 to 25% on 04/01/2020. TEE showing an EF of 25%, [facility] cardioversion on 04/04/2020. Appreciate cardiology's assistance, Lasix and Aldactone were held on 04/05/2020. She was restarted on oral Lasix on 02/22/4579, basic metabolic panel is pending.  New onset atrial fibrillation with rapid ventricular response (HCC) Underwent DCCV as above, she is currently on amiodarone and metoprolol. Now back in RVR this started yesterday in the afternoon, will await cardiology recommendations.  Diabetes mellitus type 2: Continue sliding scale insulin.  Constipation Continue MiraLAX as needed.  Essential hypertension Blood pressure is essentially well controlled.   DVT prophylaxis: eliquis Family Communication:none Status is: Inpatient  Remains inpatient appropriate because:Hemodynamically unstable   Dispo: The patient is from: Home              Anticipated d/c is to: Home              Anticipated d/c date is: 2 days              Patient currently is not medically stable to d/c.  Once rate controlled we can start thinking about discharging her.        Code Status:     Code Status Orders  (From admission, onward)         Start     Ordered   04/01/20 0818  Full code  Continuous        04/01/20  0820        Code Status History    This patient has a current code status but no historical code status.   Advance Care Planning Activity        IV Access:    Peripheral IV   Procedures and diagnostic studies:   No results found.   Medical Consultants:    None.  Anti-Infectives:   none  Subjective:    Curlene Dolphin asymptomatic. Objective:    Vitals:   04/06/20 2300 04/07/20 0000 04/07/20 0507 04/07/20 0734  BP: 125/81 123/69 (!) 95/45 115/84  Pulse:  (!) 56 (!) 52 98  Resp: (!) 22 18 17 16   Temp:  98.2 F (36.8 C) 97.8 F (36.6 C)   TempSrc:  Oral Oral   SpO2: 94% 94% 94%   Weight:  102.1 kg    Height:       SpO2: 94 % O2 Flow Rate (L/min): 6 L/min   Intake/Output Summary (Last 24 hours) at 04/07/2020 0753 Last data filed at 04/07/2020 0511 Gross per 24 hour  Intake 940 ml  Output 1100 ml  Net -160 ml   Filed Weights   04/05/20 0501 04/06/20 0446 04/07/20 0000  Weight: 103.3 kg 104.1 kg 102.1 kg    Exam: General exam: In no acute distress. Respiratory system: Good air movement and clear to auscultation. Cardiovascular system: S1 & S2 heard, RRR.  No JVD. Gastrointestinal system: Abdomen is nondistended, soft and nontender.  Extremities: No pedal edema. Skin: No rashes, lesions or ulcers Psychiatry: Judgement and insight appear normal. Mood & affect appropriate.   Data Reviewed:    Labs: Basic Metabolic Panel: Recent Labs  Lab 03/31/20 1759 04/01/20 0272 04/02/20 0231 04/02/20 0231 04/03/20 5366 04/03/20 4403 04/04/20 0541 04/04/20 0541 04/05/20 4742 04/06/20 0801  NA   < >  --  141  --  142  --  141  --  140 139  K   < >  --  3.3*   < > 3.5   < > 4.8   < > 4.1 4.2  CL   < >  --  103  --  103  --  104  --  102 105  CO2   < >  --  28  --  27  --  26  --  24 24  GLUCOSE   < >  --  156*  --  144*  --  168*  --  245* 189*  BUN   < >  --  20  --  25*  --  32*  --  35* 26*  CREATININE   < >  --  0.77  --  0.93  --  1.07*  --   1.19* 0.91  CALCIUM   < >  --  9.4  --  9.3  --  9.4  --  9.4 9.3  MG  --  2.0  --   --   --   --   --   --   --   --    < > = values in this interval not displayed.   GFR Estimated Creatinine Clearance: 71.3 mL/min (by C-G formula based on SCr of 0.91 mg/dL). Liver Function Tests: No results for input(s): AST, ALT, ALKPHOS, BILITOT, PROT, ALBUMIN in the last 168 hours. No results for input(s): LIPASE, AMYLASE in the last 168 hours. No results for input(s): AMMONIA in the last 168 hours. Coagulation profile Recent Labs  Lab 04/04/20 1027  INR 1.3*   COVID-19 Labs  No results for input(s): DDIMER, FERRITIN, LDH, CRP in the last 72 hours.  Lab Results  Component Value Date   [place] NEGATIVE 04/01/2020    CBC: Recent Labs  Lab 03/31/20 1759 04/02/20 0231  WBC 5.8 7.0  HGB 13.7 13.9  HCT 43.8 42.9  MCV 91.1 89.0  PLT 228 214   Cardiac Enzymes: No results for input(s): CKTOTAL, CKMB, CKMBINDEX, TROPONINI in the last 168 hours. BNP (last 3 results) No results for input(s): PROBNP in the last 8760 hours. CBG: Recent Labs  Lab 04/06/20 0603 04/06/20 1111 04/06/20 1650 04/06/20 2050 04/07/20 0611  GLUCAP 158* 112* 138* 144* 126*   D-Dimer: No results for input(s): DDIMER in the last 72 hours. Hgb A1c: No results for input(s): HGBA1C in the last 72 hours. Lipid Profile: No results for input(s): CHOL, HDL, LDLCALC, TRIG, CHOLHDL, LDLDIRECT in the last 72 hours. Thyroid function studies: No results for input(s): TSH, T4TOTAL, T3FREE, THYROIDAB in the last 72 hours.  Invalid input(s): FREET3 Anemia work up: No results for input(s): VITAMINB12, FOLATE, FERRITIN, TIBC, IRON, RETICCTPCT in the last 72 hours. Sepsis Labs: Recent Labs  Lab 03/31/20 1759 04/01/20 0923 04/02/20 0231  PROCALCITON  --  <0.10  --   WBC 5.8  --  7.0   Microbiology Recent Results (from the past 240 hour(s))  SARS Coronavirus 2 by  RT PCR (hospital order, performed in [facility]  hospital lab) Nasopharyngeal Nasopharyngeal Swab     Status: None   Collection Time: 04/01/20  5:49 AM   Specimen: Nasopharyngeal Swab  Result Value Ref Range Status   SARS Coronavirus 2 NEGATIVE NEGATIVE Final    Comment: (NOTE) SARS-CoV-2 target nucleic acids are NOT DETECTED.  The SARS-CoV-2 RNA is generally detectable in upper and lower respiratory specimens during the acute phase of infection. The lowest concentration of SARS-CoV-2 viral copies this assay can detect is 250 copies / mL. A negative result does not preclude SARS-CoV-2 infection and should not be used as the sole basis for treatment or other patient management decisions.  A negative result may occur with improper specimen collection / handling, submission of specimen other than nasopharyngeal swab, presence of viral mutation(s) within the areas targeted by this assay, and inadequate number of viral copies (<250 copies / mL). A negative result must be combined with clinical observations, patient history, and epidemiological information.  Fact Sheet for Patients:   StrictlyIdeas.no  Fact Sheet for Healthcare Providers: BankingDealers.co.za  This test is not yet approved or  cleared by the [place] FDA and has been authorized for detection and/or diagnosis of SARS-CoV-2 by FDA under an Emergency Use Authorization (EUA).  This EUA will remain in effect (meaning this test can be used) for the duration of the COVID-19 declaration under Section 564(b)(1) of the Act, 21 U.S.C. section 360bbb-3(b)(1), unless the authorization is terminated or revoked sooner.  Performed at [facility], [place] [address], [place], [place] [address]      Medications:   . amiodarone  200 mg Oral BID  . apixaban  5 mg Oral BID  . atorvastatin  80 mg Oral Daily  . insulin aspart  0-15 Units Subcutaneous TID WC  . insulin aspart  0-5 Units Subcutaneous QHS  . lisinopril  2.5 mg  Oral Daily  . metoprolol succinate  25 mg Oral BID WC  . sodium chloride flush  3 mL Intravenous Q12H   Continuous Infusions: . sodium chloride    . sodium chloride 20 mL/hr at 04/05/20 0305      LOS: 6 days   Charlynne Cousins  [facility]  04/07/2020, 7:53 AM

## 2020-04-07 NOTE — Progress Notes (Signed)
PT. HR elevated, Sustaining 130s. No distress noted. On call for [facility] paged to make aware.

## 2020-04-07 NOTE — Progress Notes (Addendum)
Progress Note  Patient Name: Melissa Bishop Date of Encounter: 04/07/2020  Primary Cardiologist: Evalina Field, MD  Subjective   Feeling well today with no specific complaints however patient unfortunately found to be back in AF with relatively good rate control   Inpatient Medications    Scheduled Meds: . amiodarone  200 mg Oral BID  . apixaban  5 mg Oral BID  . atorvastatin  80 mg Oral Daily  . insulin aspart  0-15 Units Subcutaneous TID WC  . insulin aspart  0-5 Units Subcutaneous QHS  . lisinopril  2.5 mg Oral Daily  . metoprolol succinate  25 mg Oral BID WC  . sodium chloride flush  3 mL Intravenous Q12H   Continuous Infusions: . sodium chloride    . sodium chloride 20 mL/hr at 04/05/20 0305   PRN Meds: sodium chloride, acetaminophen, ondansetron (ZOFRAN) IV, sodium chloride flush   Vital Signs    Vitals:   04/06/20 2200 04/06/20 2300 04/07/20 0000 04/07/20 0507  BP:  125/81 123/69 (!) 95/45  Pulse:   (!) 56 (!) 52  Resp: 14 (!) 22 18 17   Temp:   98.2 F (36.8 C) 97.8 F (36.6 C)  TempSrc:   Oral Oral  SpO2:  94% 94% 94%  Weight:   102.1 kg   Height:        Intake/Output Summary (Last 24 hours) at 04/07/2020 0629 Last data filed at 04/07/2020 0511 Gross per 24 hour  Intake 940 ml  Output 1100 ml  Net -160 ml   Filed Weights   04/05/20 0501 04/06/20 0446 04/07/20 0000  Weight: 103.3 kg 104.1 kg 102.1 kg    Physical Exam   General: Well developed, well nourished, NAD Neck: Negative for carotid bruits. No JVD Lungs:Clear to ausculation bilaterally. No wheezes. Breathing is unlabored. Cardiovascular: Irregularly irregular with S1 S2. No murmurs Abdomen: Soft, non-tender, non-distended. No obvious abdominal masses. Extremities: No edema. Radial pulses 2+ bilaterally Neuro: Alert and oriented. No focal deficits. No facial asymmetry. MAE spontaneously. Psych: Responds to questions appropriately with normal affect.    Labs     Chemistry Recent Labs  Lab 04/04/20 0541 04/05/20 0632 04/06/20 0801  NA 141 140 139  K 4.8 4.1 4.2  CL 104 102 105  CO2 26 24 24   GLUCOSE 168* 245* 189*  BUN 32* 35* 26*  CREATININE 1.07* 1.19* 0.91  CALCIUM 9.4 9.4 9.3  GFRNONAA 51* 45* >60  GFRAA 59* 52* >60  ANIONGAP 11 14 10      Hematology Recent Labs  Lab 03/31/20 1759 04/02/20 0231  WBC 5.8 7.0  RBC 4.81 4.82  HGB 13.7 13.9  HCT 43.8 42.9  MCV 91.1 89.0  MCH 28.5 28.8  MCHC 31.3 32.4  RDW 13.9 13.7  PLT 228 214    Cardiac EnzymesNo results for input(s): TROPONINI in the last 168 hours. No results for input(s): TROPIPOC in the last 168 hours.   BNP Recent Labs  Lab 04/01/20 0924 04/05/20 0949  BNP 180.8* 57.9     DDimer No results for input(s): DDIMER in the last 168 hours.   Radiology    No results found.  Telemetry    04/07/20 AF with rates in the 90-100's - Personally Reviewed  ECG    No new tracing as of 04/07/20 - Personally Reviewed  Cardiac Studies   TTE: 04/01/2020  1. Left ventricular ejection fraction, by estimation, is 25%. The left  ventricle has severely decreased function. Global hypokinesis.  Left  ventricular endocardial border not optimally defined to evaluate regional  wall motion despite the use of Definity  contrast. Left ventricular diastolic parameters are indeterminate.  2. Right ventricular systolic function is severely reduced. The right  ventricular size is mildly enlarged. There is mildly elevated pulmonary  artery systolic pressure. The estimated right ventricular systolic  pressure is 52.8 mmHg.  3. Left atrial size was mildly dilated.  4. Right atrial size was mildly dilated.  5. The mitral valve is grossly normal. No evidence of mitral valve  regurgitation.  6. The aortic valve is grossly normal. Aortic valve regurgitation is not  visualized.  7. The inferior vena cava is dilated in size with <50% respiratory  variability, suggesting right atrial  pressure of 15 mmHg.   04/04/20 TEE   Left Ventricle:20-25% EF  Mitral Valve:Mild to moderate MR  Aortic Valve:No AI  Tricuspid Valve:Mild TR  Left Atrium:No LAA thrombus  DCCV 04/04/20 Successful cardioversion of atrial fibrillation/flutter with one DCCV 120J  Patient Profile     74 y.o. female with history of diabetes, psoriasis, hypertension, atrial fibrillation in the past on Eliquis who was admitted on 04/01/2020 for acute decompensated systolic heart failure and atrial fibrillation with RVR.  Assessment & Plan    1. Atrial fibrillation: -Underwent TEE/DCCV to NSR 04/05/20 and maintained NSR for 36 hours with return to AF -Currently on po amio 200 bid    I would switch back to IV 1mg /min loading    -Continue Eliquis    2. Systolic CHF: -LVEF found to be reduced to 25 to 30% felt to be secondary to AF with RVR  -I&O, net positive 1.2L>>poor UO>>creatinine stable yesterday at 0.91>>labs pending this AM>>may need additional dose give fluid positive, poor UO and stable renal function  -Lasix and Arlyce Harman held yesterday due to soft BP -Metoprolol transitioned to Toprol  -Given lasix x1 yesterday>>amy need to repeat     3. HTN: -Stable this AM, 95/45>123/69>125/81 -Plan as above   4. DM2: -Per IM    5. CAD: -Aortic atherosclerosis/focal coronary artery calcifications found on CT -Continue atorvastatin  -LDL, 142 -Lipids will need to be followed  6. HLD: -LDL, 142 -Continue atorvastatin as above   Signed, Kathyrn Drown NP-C [facility] Pager: [phone] 04/07/2020, 6:29 AM    Pattient seen and examined  I agree with findings as noted above by Tonny Branch.  I did addend this some  Pt fairly comfortable   Mad about afib Lungs are CTA  Cardiac Irreg irreg  No S3  Ext are with Tr to 1+ edema   Currently back in in afib   Will switch amiodarone to IV to hasten load  Hopefully she will convert with this  Volume is probably up some   + I/O and  volume up   Pt overall not that uncomfortable Will continue to follow   Dorris Carnes MD   For questions or updates, please contact   Please consult [email or web address] for contact info under Cardiology/STEMI.

## 2020-04-08 LAB — GLUCOSE, CAPILLARY
Glucose-Capillary: 142 mg/dL — ABNORMAL HIGH (ref 70–99)
Glucose-Capillary: 112 mg/dL — ABNORMAL HIGH (ref 70–99)
Glucose-Capillary: 156 mg/dL — ABNORMAL HIGH (ref 70–99)
Glucose-Capillary: 141 mg/dL — ABNORMAL HIGH (ref 70–99)

## 2020-04-08 LAB — BASIC METABOLIC PANEL WITH GFR
Anion gap: 10 (ref 5–15)
BUN: 19 mg/dL (ref 8–23)
CO2: 28 mmol/L (ref 22–32)
Calcium: 9.5 mg/dL (ref 8.9–10.3)
Chloride: 104 mmol/L (ref 98–111)
Creatinine, Ser: 0.93 mg/dL (ref 0.44–1.00)
GFR calc Af Amer: >60 mL/min
GFR calc non Af Amer: >60 mL/min
Glucose, Bld: 142 mg/dL — ABNORMAL HIGH (ref 70–99)
Potassium: 3.8 mmol/L (ref 3.5–5.1)
Sodium: 142 mmol/L (ref 135–145)

## 2020-04-08 MED ORDER — INSULIN ASPART 100 UNIT/ML ~~LOC~~ SOLN
0.0000 [IU] | Freq: Three times a day (TID) | SUBCUTANEOUS | Status: DC
  Administered 2020-04-09 (×2): 3 [IU] via SUBCUTANEOUS
  Administered 2020-04-10: 2 [IU] via SUBCUTANEOUS

## 2020-04-08 MED ORDER — METOPROLOL SUCCINATE ER 50 MG PO TB24: 50.0000 mg | ORAL_TABLET | Freq: Two times a day (BID) | ORAL | Status: DC

## 2020-04-08 MED ORDER — LISINOPRIL 5 MG PO TABS
5.0000 mg | ORAL_TABLET | Freq: Every day | ORAL | Status: DC
  Administered 2020-04-08 – 2020-04-09 (×2): 5 mg via ORAL
  Filled 2020-04-08 (×2): qty 1

## 2020-04-08 MED ORDER — METOPROLOL SUCCINATE ER 50 MG PO TB24: 75.0000 mg | ORAL_TABLET | Freq: Two times a day (BID) | ORAL | Status: DC

## 2020-04-08 MED ORDER — METOPROLOL SUCCINATE ER 100 MG PO TB24
100.0000 mg | ORAL_TABLET | Freq: Two times a day (BID) | ORAL | Status: DC
  Administered 2020-04-08 – 2020-04-09 (×2): 100 mg via ORAL
  Filled 2020-04-08 (×2): qty 1

## 2020-04-08 NOTE — Progress Notes (Signed)
PT Cancellation Note  Patient Details Name: Melissa Bishop MRN: [id number] DOB: 02/02/1946   Cancelled Treatment:    Reason Eval/Treat Not Completed: Medical issues which prohibited therapy.  Spoke with cardiologist about pt's HR readings which trend from 130-154 currently.  Hold per MD and will recheck at another time.   Ramond Dial 04/08/2020, 1:05 PM   Mee Hives, PT MS Acute Rehab Dept. Number: [place] [facility] [place]

## 2020-04-08 NOTE — Progress Notes (Addendum)
Progress Note  Patient Name: Briaunna Grindstaff Date of Encounter: 04/08/2020  Primary Cardiologist: Evalina Field, MD  Subjective  Breathing is OK   No CP    Inpatient Medications    Scheduled Meds: . apixaban  5 mg Oral BID  . atorvastatin  80 mg Oral Daily  . insulin aspart  0-15 Units Subcutaneous TID WC  . insulin aspart  0-5 Units Subcutaneous QHS  . lisinopril  2.5 mg Oral Daily  . metoprolol succinate  50 mg Oral BID WC  . sodium chloride flush  3 mL Intravenous Q12H   Continuous Infusions: . sodium chloride    . sodium chloride Stopped (04/06/20 1139)  . amiodarone 30 mg/hr (04/08/20 0424)   PRN Meds: sodium chloride, acetaminophen, ondansetron (ZOFRAN) IV, sodium chloride flush   Vital Signs    Vitals:   04/08/20 0100 04/08/20 0201 04/08/20 0344 04/08/20 0739  BP:  (!) 135/98  132/90  Pulse:      Resp:  (!) 23    Temp: 97.7 F (36.5 C) (!) 97.4 F (36.3 C)    TempSrc: Oral Oral    SpO2:  95%    Weight:   101.8 kg   Height:        Intake/Output Summary (Last 24 hours) at 04/08/2020 0855 Last data filed at 04/08/2020 0834 Gross per 24 hour  Intake 1579 ml  Output 1200 ml  Net 379 ml   Filed Weights   04/06/20 0446 04/07/20 0000 04/08/20 0344  Weight: 104.1 kg 102.1 kg 101.8 kg    Physical Exam   General: Well developed, well nourished, NAD Neck: Negative for carotid bruits. No JVD Lungs:Clear to ausculation bilaterally. No wheezes. Breathing is unlabored. Cardiovascular: Irregularly irregular with S1 S2. No murmurs Abdomen: Soft, non-tender, non-distended. No obvious abdominal masses. Extremities: Triv edema.  Neuro: Alert and oriented. No focal deficits. Labs    Chemistry Recent Labs  Lab 04/06/20 0801 04/07/20 0808 04/08/20 0518  NA 139 141 142  K 4.2 3.8 3.8  CL 105 106 104  CO2 24 26 28   GLUCOSE 189* 136* 142*  BUN 26* 20 19  CREATININE 0.91 0.85 0.93  CALCIUM 9.3 9.5 9.5  GFRNONAA >60 >60 >60  GFRAA >60 >60 >60    ANIONGAP 10 9 10      Hematology Recent Labs  Lab 04/02/20 0231  WBC 7.0  RBC 4.82  HGB 13.9  HCT 42.9  MCV 89.0  MCH 28.8  MCHC 32.4  RDW 13.7  PLT 214    Cardiac EnzymesNo results for input(s): TROPONINI in the last 168 hours. No results for input(s): TROPIPOC in the last 168 hours.   BNP Recent Labs  Lab 04/01/20 0924 04/05/20 0949  BNP 180.8* 57.9     DDimer No results for input(s): DDIMER in the last 168 hours.   Radiology    No results found.  Telemetry    04/07/20 AF with rates in the 90-100's - Personally Reviewed  ECG    No new tracing as of 04/07/20 - Personally Reviewed  Cardiac Studies   TTE: 04/01/2020  1. Left ventricular ejection fraction, by estimation, is 25%. The left  ventricle has severely decreased function. Global hypokinesis. Left  ventricular endocardial border not optimally defined to evaluate regional  wall motion despite the use of Definity  contrast. Left ventricular diastolic parameters are indeterminate.  2. Right ventricular systolic function is severely reduced. The right  ventricular size is mildly enlarged. There is mildly  elevated pulmonary  artery systolic pressure. The estimated right ventricular systolic  pressure is 37.8 mmHg.  3. Left atrial size was mildly dilated.  4. Right atrial size was mildly dilated.  5. The mitral valve is grossly normal. No evidence of mitral valve  regurgitation.  6. The aortic valve is grossly normal. Aortic valve regurgitation is not  visualized.  7. The inferior vena cava is dilated in size with <50% respiratory  variability, suggesting right atrial pressure of 15 mmHg.   04/04/20 TEE   Left Ventricle:20-25% EF  Mitral Valve:Mild to moderate MR  Aortic Valve:No AI  Tricuspid Valve:Mild TR  Left Atrium:No LAA thrombus  DCCV 04/04/20 Successful cardioversion of atrial fibrillation/flutter with one DCCV 120J  Patient Profile     74 y.o. female with  history of diabetes, psoriasis, hypertension, atrial fibrillation in the past on Eliquis who was admitted on 04/01/2020 for acute decompensated systolic heart failure and atrial fibrillation with RVR.  Assessment & Plan    1. Atrial fibrillation: -Underwent TEE/DCCV to NSR 04/05/20 and maintained NSR for 36 hours with return to AF   Currently on amio IV at 60 mg / min  Would continue to see if converts   He will do better in  SR  Increase Toprol sl as well     Keep on IV amiodarone 60 mg / hour  Keep on Eliquis   2. Systolic CHF: -LVEF found to be reduced to 25 to 30% felt to be secondary to AF with RVR  Volume does not look bad on exam  Follow  He is not on a regular diuretic  3. HTN: Diastolic pressure up     Will increase lisinopril to 5 mg   Keep on Toprol XL 50   4. DM2: -Per IM    5. CAD: -Aortic atherosclerosis/focal coronary artery calcifications found on CT  6. HLD: -LDL, 142 -Continue atorvastatin as above     Dorris Carnes MD   For questions or updates, please contact   Please consult [email or web address] for contact info under Cardiology/STEMI.

## 2020-04-08 NOTE — Progress Notes (Signed)
[facility] PROGRESS NOTE    Progress Note  Melissa Bishop  MOQ:[id number] DOB: 1945/10/23 DOA: 03/31/2020 PCP: [name], MD     Brief Narrative:   Melissa Bishop is an 74 y.o. female past medical history of essential hypertension hyperlipidemia diabetes mellitus type 2 psoriasis was admitted on 05/02/2020 with progressive dyspnea on exertion, was found to have been in A. fib with RVR chest x-ray showing bilateral opacities and effusion, 2D echo showed an EF of 25% with pulmonary hypertension.  Cardiology was consulted who started IV Lasix and Eliquis.  Patient underwent DCCV on 04/04/2020.  Assessment/Plan:   Newly diagnosed acute on chronic systolic heart failure: With an EF of 20 to 25% on 04/01/2020. TEE showing an EF of 25%, [facility] cardioversion on 04/04/2020. Appreciate cardiology's assistance, Lasix and Aldactone were held on 04/05/2020. Continue oral Lasix, electrolytes have been stable, she is only positive but a liter.  New onset atrial fibrillation with rapid ventricular response (HCC) Underwent DCCV as above, she is currently on amiodarone and metoprolol. And has remained with a fast heart rate, she was started on IV amiodarone drip and bolus.  She continues to be persistently above 100.  Diabetes mellitus type 2: Continue sliding scale insulin.  Constipation Continue MiraLAX as needed.  Essential hypertension Blood pressure is essentially well controlled.   DVT prophylaxis: eliquis Family Communication:none Status is: Inpatient  Remains inpatient appropriate because:Hemodynamically unstable   Dispo: The patient is from: Home              Anticipated d/c is to: Home              Anticipated d/c date is: 2 days              Patient currently is not medically stable to d/c.  Once rate controlled we can start thinking about discharging her.        Code Status:     Code Status Orders  (From admission, onward)         Start     Ordered   04/01/20  0818  Full code  Continuous        04/01/20 0820        Code Status History    This patient has a current code status but no historical code status.   Advance Care Planning Activity        IV Access:    Peripheral IV   Procedures and diagnostic studies:   No results found.   Medical Consultants:    None.  Anti-Infectives:   none  Subjective:    Melissa Bishop asymptomatic. Objective:    Vitals:   04/08/20 0100 04/08/20 0201 04/08/20 0344 04/08/20 0739  BP:  (!) 135/98  132/90  Pulse:      Resp:  (!) 23    Temp: 97.7 F (36.5 C) (!) 97.4 F (36.3 C)    TempSrc: Oral Oral    SpO2:  95%    Weight:   101.8 kg   Height:       SpO2: 95 % O2 Flow Rate (L/min): 6 L/min   Intake/Output Summary (Last 24 hours) at 04/08/2020 0747 Last data filed at 04/08/2020 0202 Gross per 24 hour  Intake 1359 ml  Output 1200 ml  Net 159 ml   Filed Weights   04/06/20 0446 04/07/20 0000 04/08/20 0344  Weight: 104.1 kg 102.1 kg 101.8 kg    Exam: General exam: In no acute distress. Respiratory system: Good air movement  and clear to auscultation. Cardiovascular system: S1 & S2 heard, RRR. No JVD. Gastrointestinal system: Abdomen is nondistended, soft and nontender.  Extremities: No pedal edema. Skin: No rashes, lesions or ulcers Psychiatry: Judgement and insight appear normal. Mood & affect appropriate.   Data Reviewed:    Labs: Basic Metabolic Panel: Recent Labs  Lab 04/01/20 0923 04/02/20 0231 04/04/20 0541 04/04/20 0541 04/05/20 0539 04/05/20 7673 04/06/20 0801 04/06/20 0801 04/07/20 0808 04/08/20 0518  NA  --    < > 141  --  140  --  139  --  141 142  K  --    < > 4.8   < > 4.1   < > 4.2   < > 3.8 3.8  CL  --    < > 104  --  102  --  105  --  106 104  CO2  --    < > 26  --  24  --  24  --  26 28  GLUCOSE  --    < > 168*  --  245*  --  189*  --  136* 142*  BUN  --    < > 32*  --  35*  --  26*  --  20 19  CREATININE  --    < > 1.07*  --  1.19*  --   0.91  --  0.85 0.93  CALCIUM  --    < > 9.4  --  9.4  --  9.3  --  9.5 9.5  MG 2.0  --   --   --   --   --   --   --   --   --    < > = values in this interval not displayed.   GFR Estimated Creatinine Clearance: 69.7 mL/min (by C-G formula based on SCr of 0.93 mg/dL). Liver Function Tests: No results for input(s): AST, ALT, ALKPHOS, BILITOT, PROT, ALBUMIN in the last 168 hours. No results for input(s): LIPASE, AMYLASE in the last 168 hours. No results for input(s): AMMONIA in the last 168 hours. Coagulation profile Recent Labs  Lab 04/04/20 1027  INR 1.3*   COVID-19 Labs  No results for input(s): DDIMER, FERRITIN, LDH, CRP in the last 72 hours.  Lab Results  Component Value Date   [place] NEGATIVE 04/01/2020    CBC: Recent Labs  Lab 04/02/20 0231  WBC 7.0  HGB 13.9  HCT 42.9  MCV 89.0  PLT 214   Cardiac Enzymes: No results for input(s): CKTOTAL, CKMB, CKMBINDEX, TROPONINI in the last 168 hours. BNP (last 3 results) No results for input(s): PROBNP in the last 8760 hours. CBG: Recent Labs  Lab 04/07/20 0611 04/07/20 1145 04/07/20 1618 04/07/20 2130 04/08/20 0618  GLUCAP 126* 128* 143* 132* 142*   D-Dimer: No results for input(s): DDIMER in the last 72 hours. Hgb A1c: No results for input(s): HGBA1C in the last 72 hours. Lipid Profile: No results for input(s): CHOL, HDL, LDLCALC, TRIG, CHOLHDL, LDLDIRECT in the last 72 hours. Thyroid function studies: No results for input(s): TSH, T4TOTAL, T3FREE, THYROIDAB in the last 72 hours.  Invalid input(s): FREET3 Anemia work up: No results for input(s): VITAMINB12, FOLATE, FERRITIN, TIBC, IRON, RETICCTPCT in the last 72 hours. Sepsis Labs: Recent Labs  Lab 04/01/20 0923 04/02/20 0231  PROCALCITON <0.10  --   WBC  --  7.0   Microbiology Recent Results (from the past 240 hour(s))  SARS Coronavirus 2 by RT PCR (  hospital order, performed in [facility] hospital lab) Nasopharyngeal Nasopharyngeal Swab      Status: None   Collection Time: 04/01/20  5:49 AM   Specimen: Nasopharyngeal Swab  Result Value Ref Range Status   SARS Coronavirus 2 NEGATIVE NEGATIVE Final    Comment: (NOTE) SARS-CoV-2 target nucleic acids are NOT DETECTED.  The SARS-CoV-2 RNA is generally detectable in upper and lower respiratory specimens during the acute phase of infection. The lowest concentration of SARS-CoV-2 viral copies this assay can detect is 250 copies / mL. A negative result does not preclude SARS-CoV-2 infection and should not be used as the sole basis for treatment or other patient management decisions.  A negative result may occur with improper specimen collection / handling, submission of specimen other than nasopharyngeal swab, presence of viral mutation(s) within the areas targeted by this assay, and inadequate number of viral copies (<250 copies / mL). A negative result must be combined with clinical observations, patient history, and epidemiological information.  Fact Sheet for Patients:   StrictlyIdeas.no  Fact Sheet for Healthcare Providers: BankingDealers.co.za  This test is not yet approved or  cleared by the [place] FDA and has been authorized for detection and/or diagnosis of SARS-CoV-2 by FDA under an Emergency Use Authorization (EUA).  This EUA will remain in effect (meaning this test can be used) for the duration of the COVID-19 declaration under Section 564(b)(1) of the Act, 21 U.S.C. section 360bbb-3(b)(1), unless the authorization is terminated or revoked sooner.  Performed at [facility], [place] [address] [place], [place] [address]      Medications:   . apixaban  5 mg Oral BID  . atorvastatin  80 mg Oral Daily  . insulin aspart  0-15 Units Subcutaneous TID WC  . insulin aspart  0-5 Units Subcutaneous QHS  . lisinopril  2.5 mg Oral Daily  . metoprolol succinate  25 mg Oral BID WC  . sodium chloride flush  3 mL  Intravenous Q12H   Continuous Infusions: . sodium chloride    . sodium chloride Stopped (04/06/20 1139)  . amiodarone 30 mg/hr (04/08/20 0424)      LOS: 7 days   Charlynne Cousins  [facility]  04/08/2020, 7:47 AM

## 2020-04-08 NOTE — Plan of Care (Signed)
°  Problem: Clinical Measurements: °Goal: Will remain free from infection °Outcome: Progressing °  °Problem: Activity: °Goal: Risk for activity intolerance will decrease °Outcome: Progressing °  °Problem: Nutrition: °Goal: Adequate nutrition will be maintained °Outcome: Progressing °  °

## 2020-04-09 ENCOUNTER — Encounter (HOSPITAL_COMMUNITY): Payer: Self-pay | Admitting: Internal Medicine

## 2020-04-09 LAB — BASIC METABOLIC PANEL
Anion gap: 12 (ref 5–15)
BUN: 18 mg/dL (ref 8–23)
CO2: 23 mmol/L (ref 22–32)
Calcium: 9.4 mg/dL (ref 8.9–10.3)
Chloride: 105 mmol/L (ref 98–111)
Creatinine, Ser: 0.9 mg/dL (ref 0.44–1.00)
GFR calc Af Amer: >60 mL/min (ref >60)
GFR calc non Af Amer: >60 mL/min (ref >60)
Glucose, Bld: 135 mg/dL — ABNORMAL HIGH (ref 70–99)
Potassium: 3.5 mmol/L (ref 3.5–5.1)
Sodium: 140 mmol/L (ref 135–145)

## 2020-04-09 LAB — GLUCOSE, CAPILLARY
Glucose-Capillary: 151 mg/dL — ABNORMAL HIGH (ref 70–99)
Glucose-Capillary: 115 mg/dL — ABNORMAL HIGH (ref 70–99)
Glucose-Capillary: 164 mg/dL — ABNORMAL HIGH (ref 70–99)
Glucose-Capillary: 176 mg/dL — ABNORMAL HIGH (ref 70–99)

## 2020-04-09 MED ORDER — FUROSEMIDE 10 MG/ML IJ SOLN
120.0000 mg | Freq: Two times a day (BID) | INTRAVENOUS | Status: AC
  Administered 2020-04-09 (×2): 120 mg via INTRAVENOUS
  Filled 2020-04-09 (×2): qty 10

## 2020-04-09 MED ORDER — FUROSEMIDE 10 MG/ML IJ SOLN: 160.0000 mg | Freq: Two times a day (BID) | INTRAVENOUS | Status: DC

## 2020-04-09 MED ORDER — METOPROLOL SUCCINATE ER 100 MG PO TB24
100.0000 mg | ORAL_TABLET | Freq: Two times a day (BID) | ORAL | Status: DC
  Administered 2020-04-09: 100 mg via ORAL
  Filled 2020-04-09: qty 1

## 2020-04-09 MED ORDER — METOPROLOL SUCCINATE ER 50 MG PO TB24: 75.0000 mg | ORAL_TABLET | Freq: Two times a day (BID) | ORAL | Status: DC

## 2020-04-09 MED ORDER — SPIRONOLACTONE 12.5 MG HALF TABLET
12.5000 mg | ORAL_TABLET | Freq: Every day | ORAL | Status: DC
  Administered 2020-04-09 – 2020-04-10 (×2): 12.5 mg via ORAL
  Filled 2020-04-09 (×2): qty 1

## 2020-04-09 MED ORDER — FUROSEMIDE 10 MG/ML IJ SOLN: 120.0000 mg | Freq: Two times a day (BID) | INTRAVENOUS | Status: DC

## 2020-04-09 MED ORDER — POTASSIUM CHLORIDE CRYS ER 20 MEQ PO TBCR
40.0000 meq | EXTENDED_RELEASE_TABLET | Freq: Two times a day (BID) | ORAL | Status: AC
  Administered 2020-04-09 (×2): 40 meq via ORAL
  Filled 2020-04-09 (×2): qty 2

## 2020-04-09 NOTE — Progress Notes (Signed)
[facility] PROGRESS NOTE    Progress Note  Melissa Bishop  DDU:[id number] DOB: 1945-12-24 DOA: 03/31/2020 PCP: [name], MD     Brief Narrative:   Melissa Bishop is an 74 y.o. female past medical history of essential hypertension hyperlipidemia diabetes mellitus type 2 psoriasis was admitted on 05/02/2020 with progressive dyspnea on exertion, was found to have been in A. fib with RVR chest x-ray showing bilateral opacities and effusion, 2D echo showed an EF of 25% with pulmonary hypertension.  Cardiology was consulted who started IV Lasix and Eliquis.  Patient underwent DCCV on 04/04/2020.  Assessment/Plan:   Newly diagnosed acute on chronic systolic heart failure: With an EF of 20 to 25% on 04/01/2020. TEE showing an EF of 25%, [facility] cardioversion on 04/04/2020. Appreciate cardiology's assistance, Lasix and Aldactone were held on 04/05/2020. Continue oral Lasix, electrolytes have been stable, she is only positive but a liter.  New onset atrial fibrillation with rapid ventricular response (HCC) Underwent DCCV as above, she is currently on amiodarone and metoprolol. Currently on amiodarone drip and metoprolol 100 her heart rate has been below 100, but she has had episodes of in the 40s, will continue to monitor on telemetry, further management per cardiology.  Diabetes mellitus type 2: Continue sliding scale insulin.  Constipation: Continue MiraLAX as needed.  Essential hypertension Blood pressure is essentially well controlled.   DVT prophylaxis: eliquis Family Communication:none Status is: Inpatient  Remains inpatient appropriate because:Hemodynamically unstable   Dispo: The patient is from: Home              Anticipated d/c is to: Home              Anticipated d/c date is: 2 days              Patient currently is not medically stable to d/c.  Once rate controlled we can start thinking about discharging her.   Code Status:     Code Status Orders  (From  admission, onward)         Start     Ordered   04/01/20 0818  Full code  Continuous        04/01/20 0820        Code Status History    This patient has a current code status but no historical code status.   Advance Care Planning Activity        IV Access:    Peripheral IV   Procedures and diagnostic studies:   No results found.   Medical Consultants:    None.  Anti-Infectives:   none  Subjective:    Melissa Bishop asymptomatic. Objective:    Vitals:   04/09/20 0417 04/09/20 0428 04/09/20 0500 04/09/20 0600  BP: (!) 141/68     Pulse:      Resp: 18     Temp:  97.8 F (36.6 C)    TempSrc:      SpO2: 96% 91% 96% 96%  Weight:  102 kg    Height:       SpO2: 96 % O2 Flow Rate (L/min): 6 L/min   Intake/Output Summary (Last 24 hours) at 04/09/2020 0807 Last data filed at 04/09/2020 0645 Gross per 24 hour  Intake 1604.06 ml  Output 1150 ml  Net 454.06 ml   Filed Weights   04/07/20 0000 04/08/20 0344 04/09/20 0428  Weight: 102.1 kg 101.8 kg 102 kg    Exam: General exam: In no acute distress. Respiratory system: Good air movement and clear  to auscultation. Cardiovascular system: S1 & S2 heard, RRR. No JVD. Gastrointestinal system: Abdomen is nondistended, soft and nontender.  Extremities: No pedal edema. Skin: No rashes, lesions or ulcers Psychiatry: Judgement and insight appear normal. Mood & affect appropriate.   Data Reviewed:    Labs: Basic Metabolic Panel: Recent Labs  Lab 04/05/20 5093 04/05/20 2671 04/06/20 0801 04/06/20 0801 04/07/20 2458 04/07/20 0808 04/08/20 0518 04/09/20 0238  NA 140  --  139  --  141  --  142 140  K 4.1   < > 4.2   < > 3.8   < > 3.8 3.5  CL 102  --  105  --  106  --  104 105  CO2 24  --  24  --  26  --  28 23  GLUCOSE 245*  --  189*  --  136*  --  142* 135*  BUN 35*  --  26*  --  20  --  19 18  CREATININE 1.19*  --  0.91  --  0.85  --  0.93 0.90  CALCIUM 9.4  --  9.3  --  9.5  --  9.5 9.4   < > =  values in this interval not displayed.   GFR Estimated Creatinine Clearance: 72.1 mL/min (by C-G formula based on SCr of 0.9 mg/dL). Liver Function Tests: No results for input(s): AST, ALT, ALKPHOS, BILITOT, PROT, ALBUMIN in the last 168 hours. No results for input(s): LIPASE, AMYLASE in the last 168 hours. No results for input(s): AMMONIA in the last 168 hours. Coagulation profile Recent Labs  Lab 04/04/20 1027  INR 1.3*   COVID-19 Labs  No results for input(s): DDIMER, FERRITIN, LDH, CRP in the last 72 hours.  Lab Results  Component Value Date   [place] NEGATIVE 04/01/2020    CBC: No results for input(s): WBC, NEUTROABS, HGB, HCT, MCV, PLT in the last 168 hours. Cardiac Enzymes: No results for input(s): CKTOTAL, CKMB, CKMBINDEX, TROPONINI in the last 168 hours. BNP (last 3 results) No results for input(s): PROBNP in the last 8760 hours. CBG: Recent Labs  Lab 04/08/20 0618 04/08/20 1112 04/08/20 1549 04/08/20 2041 04/09/20 0558  GLUCAP 142* 112* 156* 141* 151*   D-Dimer: No results for input(s): DDIMER in the last 72 hours. Hgb A1c: No results for input(s): HGBA1C in the last 72 hours. Lipid Profile: No results for input(s): CHOL, HDL, LDLCALC, TRIG, CHOLHDL, LDLDIRECT in the last 72 hours. Thyroid function studies: No results for input(s): TSH, T4TOTAL, T3FREE, THYROIDAB in the last 72 hours.  Invalid input(s): FREET3 Anemia work up: No results for input(s): VITAMINB12, FOLATE, FERRITIN, TIBC, IRON, RETICCTPCT in the last 72 hours. Sepsis Labs: No results for input(s): PROCALCITON, WBC, LATICACIDVEN in the last 168 hours. Microbiology Recent Results (from the past 240 hour(s))  SARS Coronavirus 2 by RT PCR (hospital order, performed in [facility] hospital lab) Nasopharyngeal Nasopharyngeal Swab     Status: None   Collection Time: 04/01/20  5:49 AM   Specimen: Nasopharyngeal Swab  Result Value Ref Range Status   SARS Coronavirus 2 NEGATIVE NEGATIVE  Final    Comment: (NOTE) SARS-CoV-2 target nucleic acids are NOT DETECTED.  The SARS-CoV-2 RNA is generally detectable in upper and lower respiratory specimens during the acute phase of infection. The lowest concentration of SARS-CoV-2 viral copies this assay can detect is 250 copies / mL. A negative result does not preclude SARS-CoV-2 infection and should not be used as the sole basis for  treatment or other patient management decisions.  A negative result may occur with improper specimen collection / handling, submission of specimen other than nasopharyngeal swab, presence of viral mutation(s) within the areas targeted by this assay, and inadequate number of viral copies (<250 copies / mL). A negative result must be combined with clinical observations, patient history, and epidemiological information.  Fact Sheet for Patients:   StrictlyIdeas.no  Fact Sheet for Healthcare Providers: BankingDealers.co.za  This test is not yet approved or  cleared by the [place] FDA and has been authorized for detection and/or diagnosis of SARS-CoV-2 by FDA under an Emergency Use Authorization (EUA).  This EUA will remain in effect (meaning this test can be used) for the duration of the COVID-19 declaration under Section 564(b)(1) of the Act, 21 U.S.C. section 360bbb-3(b)(1), unless the authorization is terminated or revoked sooner.  Performed at [facility], [place] [address] [place], [place] [address]      Medications:   . apixaban  5 mg Oral BID  . atorvastatin  80 mg Oral Daily  . insulin aspart  0-15 Units Subcutaneous TID WC  . insulin aspart  0-5 Units Subcutaneous QHS  . lisinopril  5 mg Oral Daily  . metoprolol succinate  100 mg Oral BID WC  . sodium chloride flush  3 mL Intravenous Q12H  . spironolactone  12.5 mg Oral Daily   Continuous Infusions: . sodium chloride    . amiodarone 60 mg/hr (04/09/20 0645)      LOS:  8 days   Charlynne Cousins  [facility]  04/09/2020, 8:07 AM

## 2020-04-09 NOTE — Progress Notes (Signed)
Cardiology Progress Note  Patient ID: Melissa Bishop MRN: [id number] DOB: [birth year] Date of Encounter: 04/09/2020  Primary Cardiologist: Evalina Field, MD  Subjective   Chief Complaint: Still short of breath.  HPI: Remains in A. fib.  On amnio drip.  Rate controlled.  ROS:  All other ROS reviewed and negative. Pertinent positives noted in the HPI.     Inpatient Medications  Scheduled Meds: . apixaban  5 mg Oral BID  . atorvastatin  80 mg Oral Daily  . insulin aspart  0-15 Units Subcutaneous TID WC  . insulin aspart  0-5 Units Subcutaneous QHS  . lisinopril  5 mg Oral Daily  . metoprolol succinate  100 mg Oral BID WC  . potassium chloride  40 mEq Oral BID  . sodium chloride flush  3 mL Intravenous Q12H  . spironolactone  12.5 mg Oral Daily   Continuous Infusions: . sodium chloride    . amiodarone 60 mg/hr (04/09/20 0645)  . furosemide     PRN Meds: sodium chloride, acetaminophen, ondansetron (ZOFRAN) IV, sodium chloride flush   Vital Signs   Vitals:   04/09/20 0428 04/09/20 0500 04/09/20 0600 04/09/20 0846  BP:    116/79  Pulse:    (!) 143  Resp:    14  Temp: 97.8 F (36.6 C)   97.9 F (36.6 C)  TempSrc:    Oral  SpO2: 91% 96% 96% 95%  Weight: 102 kg     Height:        Intake/Output Summary (Last 24 hours) at 04/09/2020 0855 Last data filed at 04/09/2020 0645 Gross per 24 hour  Intake 1384.06 ml  Output 1150 ml  Net 234.06 ml   Last 3 Weights 04/09/2020 04/08/2020 04/07/2020  Weight (lbs) 224 lb 12.8 oz 224 lb 6.4 oz 225 lb  Weight (kg) 101.969 kg 101.787 kg 102.059 kg      Telemetry  Overnight telemetry shows atrial fibrillation in the 90-100 range, which I personally reviewed.   ECG  The most recent ECG shows sinus bradycardia with deep T wave inversions anterior leads, which I personally reviewed.   Physical Exam   Vitals:   04/09/20 0428 04/09/20 0500 04/09/20 0600 04/09/20 0846  BP:    116/79  Pulse:    (!) 143  Resp:    14  Temp: 97.8 F  (36.6 C)   97.9 F (36.6 C)  TempSrc:    Oral  SpO2: 91% 96% 96% 95%  Weight: 102 kg     Height:         Intake/Output Summary (Last 24 hours) at 04/09/2020 0855 Last data filed at 04/09/2020 0645 Gross per 24 hour  Intake 1384.06 ml  Output 1150 ml  Net 234.06 ml    Last 3 Weights 04/09/2020 04/08/2020 04/07/2020  Weight (lbs) 224 lb 12.8 oz 224 lb 6.4 oz 225 lb  Weight (kg) 101.969 kg 101.787 kg 102.059 kg    Body mass index is 31.35 kg/m.   General: Well nourished, well developed, in no acute distress Head: Atraumatic, normal size  Eyes: PEERLA, EOMI  Neck: Supple, + JVD Endocrine: No thryomegaly Cardiac: Normal S1, S2; RRR; no murmurs, rubs, or gallops Lungs: +rales Abd: Soft, nontender, no hepatomegaly  Ext: 2+ pitting edema  Musculoskeletal: No deformities, BUE and BLE strength normal and equal Skin: Warm and dry, no rashes   Neuro: Alert and oriented to person, place, time, and situation, CNII-XII grossly intact, no focal deficits  Psych: Normal mood and affect  Labs  High Sensitivity Troponin:   Recent Labs  Lab 03/31/20 1759 03/31/20 2028  TROPONINIHS 16 17     Cardiac EnzymesNo results for input(s): TROPONINI in the last 168 hours. No results for input(s): TROPIPOC in the last 168 hours.  Chemistry Recent Labs  Lab 04/07/20 0808 04/08/20 0518 04/09/20 0238  NA 141 142 140  K 3.8 3.8 3.5  CL 106 104 105  CO2 26 28 23   GLUCOSE 136* 142* 135*  BUN 20 19 18   CREATININE 0.85 0.93 0.90  CALCIUM 9.5 9.5 9.4  GFRNONAA >60 >60 >60  GFRAA >60 >60 >60  ANIONGAP 9 10 12     HematologyNo results for input(s): WBC, RBC, HGB, HCT, MCV, MCH, MCHC, RDW, PLT in the last 168 hours. BNP Recent Labs  Lab 04/05/20 0949  BNP 57.9    DDimer No results for input(s): DDIMER in the last 168 hours.   Radiology  No results found.  Cardiac Studies  TTE 04/01/2020 1. Left ventricular ejection fraction, by estimation, is 25%. The left  ventricle has severely  decreased function. Global hypokinesis. Left  ventricular endocardial border not optimally defined to evaluate regional  wall motion despite the use of Definity  contrast. Left ventricular diastolic parameters are indeterminate.  2. Right ventricular systolic function is severely reduced. The right  ventricular size is mildly enlarged. There is mildly elevated pulmonary  artery systolic pressure. The estimated right ventricular systolic  pressure is 09.6 mmHg.  3. Left atrial size was mildly dilated.  4. Right atrial size was mildly dilated.  5. The mitral valve is grossly normal. No evidence of mitral valve  regurgitation.  6. The aortic valve is grossly normal. Aortic valve regurgitation is not  visualized.  7. The inferior vena cava is dilated in size with <50% respiratory  variability, suggesting right atrial pressure of 15 mmHg.   Patient Profile  Melissa Bishop is a 74 y.o. female with hypertension, diabetes was admitted on 2/83/6629 for acute systolic heart failure and atrial fibrillation with RVR.  Assessment & Plan   1.  Atrial fibrillation with RVR -Admitted with new onset systolic heart failure thought to be arrhythmia related.  Underwent TEE/cardioversion on 04/05/2020.  Had recurrence of atrial fibrillation on 04/07/2020. -Started on IV amiodarone.  We will continue this for 1 more day IV.  Will transition to oral amiodarone tomorrow.  I do favor aggressive amiodarone load and then reattempt cardioversion as an outpatient. -Still has volume on board.  Diuretics below. -Continue Eliquis 5 mg twice daily  2.  New onset systolic heart failure, ejection fraction 25%, possibly arrhythmia related -EF 25%.  In A. fib with RVR. -Possibly this is related to arrhythmia.  Continue IV amnio today.  Transition to p.o. amnio tomorrow. -I think she still volume up.  Has not had good diuresis in my opinion.  We will increase her Lasix to 120 mg IV twice daily.  40 mEq of potassium  twice daily as well.  If we can get good volume we will today.  Likely transition to oral Lasix tomorrow.  Possibly home tomorrow or Monday. -She is on metoprolol succinate 100 mg twice daily. -She is on lisinopril 5 mg daily -She is on Aldactone 12.5 mg daily. -Issues with BP.  Unlikely to tolerate Entresto. -When she was in sinus rhythm she did have deep T wave inversions.  No symptoms of chest pain.  Troponins were also negative when she was in A. fib with RVR on admission.  Overall I do not feel she has ischemic heart disease.  For questions or updates, please contact [place] Please consult [email or web address] for contact info under   Time Spent with Patient: I have spent a total of 25 minutes with patient reviewing hospital notes, telemetry, EKGs, labs and examining the patient as well as establishing an assessment and plan that was discussed with the patient.  > 50% of time was spent in direct patient care.    Signed, Addison Naegeli. Audie Box, [place]  04/09/2020 8:55 AM

## 2020-04-10 ENCOUNTER — Inpatient Hospital Stay (HOSPITAL_COMMUNITY): Payer: Medicare Other

## 2020-04-10 LAB — BASIC METABOLIC PANEL
Anion gap: 13 (ref 5–15)
BUN: 18 mg/dL (ref 8–23)
CO2: 24 mmol/L (ref 22–32)
Calcium: 9.5 mg/dL (ref 8.9–10.3)
Chloride: 104 mmol/L (ref 98–111)
Creatinine, Ser: 1.11 mg/dL — ABNORMAL HIGH (ref 0.44–1.00)
GFR calc Af Amer: 57 mL/min — ABNORMAL LOW (ref >60)
GFR calc non Af Amer: 49 mL/min — ABNORMAL LOW (ref >60)
Glucose, Bld: 146 mg/dL — ABNORMAL HIGH (ref 70–99)
Potassium: 4.4 mmol/L (ref 3.5–5.1)
Sodium: 141 mmol/L (ref 135–145)

## 2020-04-10 LAB — GLUCOSE, CAPILLARY: Glucose-Capillary: 122 mg/dL — ABNORMAL HIGH (ref 70–99)

## 2020-04-10 MED ORDER — METOPROLOL SUCCINATE ER 25 MG PO TB24
25.0000 mg | ORAL_TABLET | Freq: Every day | ORAL | Status: DC
  Filled 2020-04-10: qty 1

## 2020-04-10 MED ORDER — AMIODARONE HCL 200 MG PO TABS: 200.0000 mg | ORAL_TABLET | Freq: Two times a day (BID) | ORAL | 0 refills | Status: DC

## 2020-04-10 MED ORDER — FUROSEMIDE 40 MG PO TABS
40.0000 mg | ORAL_TABLET | Freq: Every day | ORAL | 0 refills | Status: DC
Start: 2020-04-11 — End: 2020-04-22

## 2020-04-10 MED ORDER — AMIODARONE HCL 200 MG PO TABS
200.0000 mg | ORAL_TABLET | Freq: Two times a day (BID) | ORAL | Status: DC
  Administered 2020-04-10: 200 mg via ORAL
  Filled 2020-04-10: qty 1

## 2020-04-10 MED ORDER — AMIODARONE HCL 200 MG PO TABS: 200.0000 mg | ORAL_TABLET | Freq: Every day | ORAL | Status: DC

## 2020-04-10 MED ORDER — FUROSEMIDE 40 MG PO TABS
40.0000 mg | ORAL_TABLET | Freq: Every day | ORAL | Status: DC
  Administered 2020-04-10: 40 mg via ORAL
  Filled 2020-04-10: qty 1

## 2020-04-10 MED ORDER — METOPROLOL SUCCINATE ER 25 MG PO TB24
25.0000 mg | ORAL_TABLET | Freq: Every day | ORAL | 3 refills | Status: DC
Start: 2020-04-11 — End: 2020-04-22

## 2020-04-10 MED ORDER — SPIRONOLACTONE 25 MG PO TABS
12.5000 mg | ORAL_TABLET | Freq: Every day | ORAL | 3 refills | Status: DC
Start: 2020-04-11 — End: 2020-04-22

## 2020-04-10 NOTE — TOC Transition Note (Signed)
Transition of Care [facility]) - CM/SW Discharge Note   Patient Details  Name: Melissa Bishop MRN: [id number] Date of Birth: [birth year]  Transition of Care [facility] CM/SW Contact:  Bethena Roys, RN Phone Number: 04/10/2020, 8:58 AM   Clinical Narrative: Patient will transition home with [place] via [facility]. Case Manager did call [facility] and start of care will begin on Monday. No further needs from Case Manager at this time.     Final next level of care: [place] Barriers to Discharge: No Barriers Identified   Patient Goals and CMS Choice Patient states their goals for this hospitalization and ongoing recovery are:: get better CMS Medicare.gov Compare Post Acute Care list provided to:: Patient Represenative (must comment) Choice offered to / list presented to : Parent  HH Arranged: RN, Disease Management [place] Agency: [place] Date [facility] Agency Contacted: 04/04/20 Time [place]: [phone] Representative spoke with at [place]: Malachy Mood    Readmission Risk Interventions No flowsheet data found.

## 2020-04-10 NOTE — Progress Notes (Signed)
Cardiology Progress Note  Patient ID: Melissa Bishop MRN: [id number] DOB: Nov 08, 1945 Date of Encounter: 04/10/2020  Primary Cardiologist: Evalina Field, MD  Subjective   Chief Complaint: Breathing improved.  Still short of breath.  HPI: Converted back to sinus rhythm this morning.  Still with symptoms of shortness of breath.  ROS:  All other ROS reviewed and negative. Pertinent positives noted in the HPI.     Inpatient Medications  Scheduled Meds: . amiodarone  200 mg Oral BID   Followed by  . [START ON 04/17/2020] amiodarone  200 mg Oral Daily  . apixaban  5 mg Oral BID  . atorvastatin  80 mg Oral Daily  . furosemide  40 mg Oral Daily  . insulin aspart  0-15 Units Subcutaneous TID WC  . insulin aspart  0-5 Units Subcutaneous QHS  . lisinopril  5 mg Oral Daily  . metoprolol succinate  25 mg Oral Daily  . sodium chloride flush  3 mL Intravenous Q12H  . spironolactone  12.5 mg Oral Daily   Continuous Infusions: . sodium chloride     PRN Meds: sodium chloride, acetaminophen, ondansetron (ZOFRAN) IV, sodium chloride flush   Vital Signs   Vitals:   04/10/20 0130 04/10/20 0230 04/10/20 0330 04/10/20 0430  BP: (!) 92/59 (!) 100/54 94/62 120/68  Pulse: (!) 40 (!) 42 (!) 46 (!) 47  Resp: 14 14 18 15   Temp:    98.1 F (36.7 C)  TempSrc:    Oral  SpO2: 98% 98%  97%  Weight:      Height:        Intake/Output Summary (Last 24 hours) at 04/10/2020 0805 Last data filed at 04/09/2020 2104 Gross per 24 hour  Intake 840 ml  Output 1475 ml  Net -635 ml   Last 3 Weights 04/10/2020 04/09/2020 04/08/2020  Weight (lbs) 222 lb 9.6 oz 224 lb 12.8 oz 224 lb 6.4 oz  Weight (kg) 100.971 kg 101.969 kg 101.787 kg      Telemetry  Overnight telemetry shows sinus bradycardia with heart rate in the 40s, converted to normal sinus rhythm around 1 AM, which I personally reviewed.   ECG  The most recent ECG shows sinus bradycardia, heart rate 47, narrow QRS, diffuse inferior and anterior  lateral T wave inversions, which I personally reviewed.   Physical Exam   Vitals:   04/10/20 0130 04/10/20 0230 04/10/20 0330 04/10/20 0430  BP: (!) 92/59 (!) 100/54 94/62 120/68  Pulse: (!) 40 (!) 42 (!) 46 (!) 47  Resp: 14 14 18 15   Temp:    98.1 F (36.7 C)  TempSrc:    Oral  SpO2: 98% 98%  97%  Weight:      Height:         Intake/Output Summary (Last 24 hours) at 04/10/2020 0805 Last data filed at 04/09/2020 2104 Gross per 24 hour  Intake 840 ml  Output 1475 ml  Net -635 ml    Last 3 Weights 04/10/2020 04/09/2020 04/08/2020  Weight (lbs) 222 lb 9.6 oz 224 lb 12.8 oz 224 lb 6.4 oz  Weight (kg) 100.971 kg 101.969 kg 101.787 kg    Body mass index is 31.05 kg/m.  General: Well nourished, well developed, in no acute distress Head: Atraumatic, normal size  Eyes: PEERLA, EOMI  Neck: Supple, no JVD Endocrine: No thryomegaly Cardiac: Normal S1, S2; RRR; no murmurs, rubs, or gallops Lungs: Clear to auscultation bilaterally, no wheezing, rhonchi or rales  Abd: Soft, nontender, no hepatomegaly  Ext: Trace edema Musculoskeletal: No deformities, BUE and BLE strength normal and equal Skin: Warm and dry, no rashes   Neuro: Alert and oriented to person, place, time, and situation, CNII-XII grossly intact, no focal deficits  Psych: Normal mood and affect   Labs  High Sensitivity Troponin:   Recent Labs  Lab 03/31/20 1759 03/31/20 2028  TROPONINIHS 16 17     Cardiac EnzymesNo results for input(s): TROPONINI in the last 168 hours. No results for input(s): TROPIPOC in the last 168 hours.  Chemistry Recent Labs  Lab 04/07/20 0808 04/08/20 0518 04/09/20 0238  NA 141 142 140  K 3.8 3.8 3.5  CL 106 104 105  CO2 26 28 23   GLUCOSE 136* 142* 135*  BUN 20 19 18   CREATININE 0.85 0.93 0.90  CALCIUM 9.5 9.5 9.4  GFRNONAA >60 >60 >60  GFRAA >60 >60 >60  ANIONGAP 9 10 12     HematologyNo results for input(s): WBC, RBC, HGB, HCT, MCV, MCH, MCHC, RDW, PLT in the last 168  hours. BNP Recent Labs  Lab 04/05/20 0949  BNP 57.9    DDimer No results for input(s): DDIMER in the last 168 hours.   Radiology  No results found.  Cardiac Studies  TTE 04/01/2020  1. Left ventricular ejection fraction, by estimation, is 25%. The left  ventricle has severely decreased function. Global hypokinesis. Left  ventricular endocardial border not optimally defined to evaluate regional  wall motion despite the use of Definity  contrast. Left ventricular diastolic parameters are indeterminate.  2. Right ventricular systolic function is severely reduced. The right  ventricular size is mildly enlarged. There is mildly elevated pulmonary  artery systolic pressure. The estimated right ventricular systolic  pressure is 55.7 mmHg.  3. Left atrial size was mildly dilated.  4. Right atrial size was mildly dilated.  5. The mitral valve is grossly normal. No evidence of mitral valve  regurgitation.  6. The aortic valve is grossly normal. Aortic valve regurgitation is not  visualized.  7. The inferior vena cava is dilated in size with <50% respiratory  variability, suggesting right atrial pressure of 15 mmHg.   Patient Profile  Melissa Bishop is a 74 y.o. female with hypertension, diabetes who admitted on 11/01/00 for acute systolic heart failure with atrial fibrillation with RVR.  Assessment & Plan   1.  Atrial fibrillation with RVR -Failed TEE/cardioversion on 04/05/2020 -On amiodarone and converted back to sinus rhythm -With sinus bradycardia but this is okay -We will plan for oral amiodarone 200 mg twice daily for 7 days and then 200 mg daily.  I will then see her back in the office and get her over to EP for ablation consideration.  Given that she is had drop in EF with A. fib she would merit evaluation for A. fib ablation. -I have dropped her metoprolol succinate to 20 mg daily.  Continue Eliquis 5 mg twice daily.  2.  Acute systolic heart failure, ejection fraction  25%, possibly arrhythmia related -I did review her echocardiogram again.  It does appear she has basal hypokinesis and possibly a stress-induced pattern. -Her EKG also demonstrates sinus bradycardia with profound T wave inversions.  This could go with a stress-induced myopathy. -Repeat a limited echo today. -Reduce metoprolol succinate 20 mg daily.  Continue lisinopril 5 mg daily.  Continue Aldactone 12 mg daily -We will repeat a limited echocardiogram today.  Possibly discharge home today or tomorrow. -She does appear euvolemic.  3.  Sinus bradycardia -Heart rate  in the 40-50 range.  I have reduced her metoprolol.  Continue amnio as above.  We will have her walk around to make sure her heart rate can increase appropriately.  As long as she has no symptoms I am okay to continue her AV nodal agents.  For questions or updates, please contact [place] Please consult [email or web address] for contact info under   Time Spent with Patient: I have spent a total of 25 minutes with patient reviewing hospital notes, telemetry, EKGs, labs and examining the patient as well as establishing an assessment and plan that was discussed with the patient.  > 50% of time was spent in direct patient care.    Signed, Addison Naegeli. Audie Box, [place]  04/10/2020 8:05 AM

## 2020-04-10 NOTE — Discharge Summary (Addendum)
Physician Discharge Summary  Melissa Bishop IHK:[id number] DOB: 11/17/1945 DOA: 03/31/2020  PCP: [name], MD  Admit date: 03/31/2020 Discharge date: 04/10/2020  Admitted From: Home Disposition:  Home  Recommendations for Outpatient Follow-up:  Follow up with Cards in 1-2 weeks Please obtain BMP/CBC in one week   Home Health:Yes Equipment/Devices:None  Discharge Condition:Stable CODE STATUS:Full Diet recommendation: Heart Healthy   Brief/Interim Summary:  74 y.o. female past medical history of essential hypertension hyperlipidemia diabetes mellitus type 2 psoriasis was admitted on 05/02/2020 with progressive dyspnea on exertion, was found to have been in A. fib with RVR chest x-ray showing bilateral opacities and effusion, 2D echo showed an EF of 25% with pulmonary hypertension.  Cardiology was consulted who started IV Lasix and Eliquis.  Patient underwent DCCV on 04/04/2020  Discharge Diagnoses:  Principal Problem:   Atrial fibrillation with rapid ventricular response (HCC) Active Problems:   Psoriasis   Constipation   Essential hypertension   Diabetes mellitus type II, non insulin dependent (HCC)   Dyspnea   Acute systolic heart failure ([place])  Newly diagnosed acute on chronic systolic heart failure: With an EF of 20% on 04/02/2020, TEE showed an EF of 25% no thrombus TCC on 04/04/2020. Cardiology was consulted she was restarted on Lasix Aldactone and beta-blockers which were held on admission. She continues to carry medication and reassess as an outpatient.  Paroxysmal atrial fibrillation/atrial fibrillation with RVR: Underwent DCCV, with hard to control she was started on amiodarone and metoprolol which she will continue as an outpatient. Physical therapy evaluated the patient and recommended physical therapy at home. She was also started on Eliquis which she will continue as an outpatient.  Diabetes mellitus type 2 no change made to his  medication.  Constipation: Continue MiraLAX as needed.  Essential hypertension: Well controlled.  Discharge Instructions  Discharge Instructions     Amb referral to [facility]   Complete by: As directed    Diet - low sodium heart healthy   Complete by: As directed    Increase activity slowly   Complete by: As directed       Allergies as of 04/10/2020   No Known Allergies      Medication List     TAKE these medications    amiodarone 200 MG tablet Commonly known as: PACERONE Take 1 tablet (200 mg total) by mouth 2 (two) times daily.   atorvastatin 80 MG tablet Commonly known as: LIPITOR Take 80 mg by mouth daily.   CENTRUM SILVER ADULT 50+ PO Take 1 tablet by mouth daily.   PreserVision AREDS 2 Caps Take 1 capsule by mouth daily.   Eliquis 5 MG Tabs tablet Generic drug: apixaban Take 5 mg by mouth 2 (two) times daily.   furosemide 40 MG tablet Commonly known as: LASIX Take 1 tablet (40 mg total) by mouth daily. Start taking on: April 11, 2020   HYDROcodone-acetaminophen 5-325 MG tablet Commonly known as: Norco Take 1 tablet by mouth every 6 (six) hours as needed for severe pain.   lisinopril 20 MG tablet Commonly known as: ZESTRIL Take 20 mg by mouth daily.   metFORMIN 500 MG tablet Commonly known as: GLUCOPHAGE Take 500 mg by mouth 2 (two) times daily with a meal.   metoprolol succinate 25 MG 24 hr tablet Commonly known as: TOPROL-XL Take 1 tablet (25 mg total) by mouth daily. Start taking on: April 11, 2020   ondansetron 4 MG tablet Commonly known as: ZOFRAN Take 1 tablet (4 mg total)  by mouth every 6 (six) hours as needed for nausea or vomiting.   spironolactone 25 MG tablet Commonly known as: ALDACTONE Take 0.5 tablets (12.5 mg total) by mouth daily. Start taking on: April 11, 2020   Stelara 45 MG/0.5ML Soln Generic drug: Ustekinumab Inject 45 mg into the skin every 3 (three) months.         No Known  Allergies  Consultations: Cards   Procedures/Studies: DG Chest 2 View  Result Date: 03/31/2020 CLINICAL DATA:  Shortness of breath x2 weeks. EXAM: CHEST - 2 VIEW COMPARISON:  March 31, 2020 (3:41 p.m.) FINDINGS: Mild, diffuse, chronic appearing increased interstitial lung markings are noted. Predominant stable mild to moderate severity areas of atelectasis and/or infiltrate are seen within the bilateral lung bases, right greater than left. Small bilateral pleural effusions are seen. No pneumothorax is identified. The heart size and mediastinal contours are within normal limits. The visualized skeletal structures are unremarkable. IMPRESSION: 1. Mild to moderate severity areas of atelectasis and/or infiltrate within the bilateral lung bases, right greater than left. 2. Small bilateral pleural effusions. Electronically Signed   By: Virgina Norfolk M.D.   On: 03/31/2020 17:38   CT Angio Chest PE W and/or Wo Contrast  Result Date: 04/01/2020 CLINICAL DATA:  Shortness of breath with tachycardia EXAM: CT ANGIOGRAPHY CHEST WITH CONTRAST TECHNIQUE: Multidetector CT imaging of the chest was performed using the standard protocol during bolus administration of intravenous contrast. Multiplanar CT image reconstructions and MIPs were obtained to evaluate the vascular anatomy. CONTRAST:  62mL OMNIPAQUE IOHEXOL 350 MG/ML SOLN COMPARISON:  Chest radiograph March 31, 2020. Right breast mammogram and right breast ultrasound February 11, 2020 FINDINGS: Cardiovascular: There is no demonstrable pulmonary embolus. There is no thoracic aortic aneurysm. No dissection seen. Note that the contrast bolus in the aorta is not sufficient for potential thoracic aortic dissection assessment. The visualized great vessels appear unremarkable. There is aortic atherosclerosis. There are foci of coronary artery calcification. There is no pericardial effusion or pericardial thickening. Heart is enlarged. Mediastinum/Nodes: Thyroid is  slightly inhomogeneous without well-defined focal mass. There are subcentimeter mediastinal lymph nodes. There is no frank adenopathy by size criteria in the thoracic region. There is a small hiatal hernia. Lungs/Pleura: There is underlying centrilobular emphysematous change. There are moderate free-flowing pleural effusions bilaterally. There is atelectatic change in both lower lung regions with patchy superimposed airspace opacity concerning for a degree of pneumonia in the lower lung regions as well. Elsewhere, there are areas of somewhat mosaic attenuation. Upper Abdomen: There is upper abdominal aortic atherosclerosis. There is slight reflux of contrast into the inferior vena cava and hepatic veins. Visualized upper abdominal structures otherwise appear normal. Musculoskeletal: There are foci of degenerative change in the thoracic spine. No blastic or lytic bone lesions. No chest wall lesions are evident. There is a focal area of asymmetric opacity in the upper outer quadrant of the right breast containing a small calcification. This opacity measures 2.4 x 1.8 cm. Patient has had recent mammography and ultrasound of the right breast. Review of the MIP images confirms the above findings. IMPRESSION: 1. No evident pulmonary embolus. No thoracic aortic aneurysm. No dissection evident. Note that the contrast bolus in the aorta is not sufficient for confident dissection assessment. There is aortic atherosclerosis as well as foci of coronary artery calcification. There is cardiomegaly. 2. Free-flowing pleural effusions bilaterally with compressive atelectasis in both lower lung zones. There may well be associated pneumonia in these areas, particularly on the right. 3.  Areas of patchy ill-defined mosaic attenuation may represent a degree of underlying pulmonary edema. Given the cardiomegaly and pleural effusions as well as this appearance, a degree of congestive heart failure is question. There may also be a degree of  underlying small airways obstructive disease. 4. Slight reflux of contrast into the inferior vena cava and hepatic veins may be indicative of a degree of increase in right heart pressure. 5. There is a degree of underlying centrilobular emphysematous change. 6.  No adenopathy evident. 7.  Small hiatal hernia. Aortic Atherosclerosis (ICD10-I70.0) and Emphysema (ICD10-J43.9). Electronically Signed   By: Lowella Grip III M.D.   On: 04/01/2020 08:34   DG Abd Portable 1V  Result Date: 04/01/2020 CLINICAL DATA:  Constipation EXAM: PORTABLE ABDOMEN - 1 VIEW COMPARISON:  11/20/2018 FINDINGS: Two supine frontal views of the abdomen and pelvis demonstrate chronic gaseous distention of the colon. Moderate retained stool within the distal colon. No evidence of bowel obstruction or ileus. No masses or abnormal calcifications. IMPRESSION: 1. Redundant distended gas-filled colon unchanged since prior CT. Moderate fecal retention. Electronically Signed   By: Randa Ngo M.D.   On: 04/01/2020 15:10   ECHOCARDIOGRAM COMPLETE  Result Date: 04/01/2020    ECHOCARDIOGRAM REPORT   Patient Name:   Melissa Bishop Date of Exam: 04/01/2020 Medical Rec #:  [id number]     Height:       71.0 in Accession #:    [id number]    Weight:       179.9 lb Date of Birth:  [birth year]     BSA:          2.016 m Patient Age:    68 years      BP:           129/88 mmHg Patient Gender: F             HR:           144 bpm. Exam Location:  Inpatient Procedure: 2D Echo, Color Doppler, Cardiac Doppler and Intracardiac            Opacification Agent Indications:    I48.91* Unspecified atrial fibrillation  History:        Patient has prior history of Echocardiogram examinations, most                 recent 05/13/2017. Arrythmias:Atrial Fibrillation; Risk                 Factors:Hypertension, Diabetes and Dyslipidemia.  Sonographer:    Bernadene Person RDCS Referring Phys: [id number] RONDELL A Mcevers  Sonographer Comments: Technically difficult study due to poor  echo windows. Technically difficult due to patient body habitus. IMPRESSIONS  1. Left ventricular ejection fraction, by estimation, is 25%. The left ventricle has severely decreased function. Global hypokinesis. Left ventricular endocardial border not optimally defined to evaluate regional wall motion despite the use of Definity contrast. Left ventricular diastolic parameters are indeterminate.  2. Right ventricular systolic function is severely reduced. The right ventricular size is mildly enlarged. There is mildly elevated pulmonary artery systolic pressure. The estimated right ventricular systolic pressure is 48.1 mmHg.  3. Left atrial size was mildly dilated.  4. Right atrial size was mildly dilated.  5. The mitral valve is grossly normal. No evidence of mitral valve regurgitation.  6. The aortic valve is grossly normal. Aortic valve regurgitation is not visualized.  7. The inferior vena cava is dilated in size with <50% respiratory variability, suggesting right atrial pressure of 15 mmHg.  FINDINGS  Left Ventricle: Left ventricular ejection fraction, by estimation, is 25%. The left ventricle has severely decreased function. Left ventricular endocardial border not optimally defined to evaluate regional wall motion. Definity contrast agent was given IV to delineate the left ventricular endocardial borders. Left ventricular diastolic parameters are indeterminate. Right Ventricle: The right ventricular size is mildly enlarged. Right vetricular wall thickness was not assessed. Right ventricular systolic function is severely reduced. There is mildly elevated pulmonary artery systolic pressure. The tricuspid regurgitant velocity is 2.50 m/s, and with an assumed right atrial pressure of 15 mmHg, the estimated right ventricular systolic pressure is 28.4 mmHg. Left Atrium: Left atrial size was mildly dilated. Right Atrium: Right atrial size was mildly dilated. Pericardium: The pericardium was not well visualized. Mitral  Valve: The mitral valve is grossly normal. No evidence of mitral valve regurgitation. Tricuspid Valve: The tricuspid valve is normal in structure. Tricuspid valve regurgitation is trivial. Aortic Valve: The aortic valve is grossly normal. Aortic valve regurgitation is not visualized. Pulmonic Valve: The pulmonic valve was not well visualized. Pulmonic valve regurgitation is trivial. Aorta: The aortic root was not well visualized and the ascending aorta was not well visualized. Venous: The inferior vena cava is dilated in size with less than 50% respiratory variability, suggesting right atrial pressure of 15 mmHg. IAS/Shunts: The interatrial septum was not well visualized.  LEFT VENTRICLE PLAX 2D LVOT diam:     2.00 cm  Diastology LV SV:         22       LV e' lateral:   6.96 cm/s LV SV Index:   11       LV E/e' lateral: 10.8 LVOT Area:     3.14 cm LV e' medial:    2.83 cm/s                         LV E/e' medial:  26.6  RIGHT VENTRICLE RV S prime:     12.00 cm/s TAPSE (M-mode): 1.1 cm LEFT ATRIUM             Index       RIGHT ATRIUM           Index LA Vol (A2C):   82.3 ml 40.83 ml/m RA Area:     18.30 cm LA Vol (A4C):   59.7 ml 29.62 ml/m RA Volume:   49.20 ml  24.41 ml/m LA Biplane Vol: 73.7 ml 36.56 ml/m  AORTIC VALVE LVOT Vmax:   53.90 cm/s LVOT Vmean:  33.700 cm/s LVOT VTI:    0.071 m  AORTA Ao Root diam: 3.20 cm Ao Asc diam:  2.80 cm MITRAL VALVE               TRICUSPID VALVE MV Area (PHT): 4.83 cm    TR Peak grad:   25.0 mmHg MV Decel Time: 157 msec    TR Vmax:        250.00 cm/s MV E velocity: 75.20 cm/s                            SHUNTS                            Systemic VTI:  0.07 m  Systemic Diam: 2.00 cm Cherlynn Kaiser MD Electronically signed by Cherlynn Kaiser MD Signature Date/Time: 04/01/2020/4:33:03 PM    Final    ECHO TEE  Result Date: 04/04/2020    TRANSESOPHOGEAL ECHO REPORT   Patient Name:   Melissa Bishop Date of Exam: 04/04/2020 Medical Rec #:  [id number]      Height:       71.0 in Accession #:    [id number]    Weight:       227.6 lb Date of Birth:  [birth year]     BSA:          2.228 m Patient Age:    30 years      BP:           95/64 mmHg Patient Gender: F             HR:           134 bpm. Exam Location:  Inpatient Procedure: Transesophageal Echo Indications:     427.31 atrial fibrillation  History:         Patient has prior history of Echocardiogram examinations, most                  recent 04/01/2020. Risk Factors:Hypertension, Diabetes,                  Dyslipidemia and Former Smoker.  Sonographer:     Jannett Celestine RDCS (AE) Referring Phys:  [id number] MARK C SKAINS Diagnosing Phys: [name] MD PROCEDURE: The transesophogeal probe was passed without difficulty through the esophogus of the patient. Sedation performed by different physician. The patient's vital signs; including heart rate, blood pressure, and oxygen saturation; remained stable throughout the procedure. The patient developed no complications during the procedure. IMPRESSIONS  1. Left ventricular ejection fraction, by estimation, is 20 to 25%. The left ventricle has severely decreased function. The left ventricle has no regional wall motion abnormalities.  2. Right ventricular systolic function is normal. The right ventricular size is normal.  3. Left atrial size was moderately dilated. No left atrial/left atrial appendage thrombus was detected.  4. The mitral valve is normal in structure. Mild to moderate mitral valve regurgitation. No evidence of mitral stenosis.  5. Tricuspid valve regurgitation is mild to moderate.  6. The aortic valve is normal in structure. Aortic valve regurgitation is not visualized. No aortic stenosis is present.  7. The inferior vena cava is normal in size with greater than 50% respiratory variability, suggesting right atrial pressure of 3 mmHg. FINDINGS  Left Ventricle: Left ventricular ejection fraction, by estimation, is 20 to 25%. The left ventricle has severely decreased  function. The left ventricle has no regional wall motion abnormalities. The left ventricular internal cavity size was normal in size. There is no left ventricular hypertrophy. Right Ventricle: The right ventricular size is normal. No increase in right ventricular wall thickness. Right ventricular systolic function is normal. Left Atrium: Left atrial size was moderately dilated. No left atrial/left atrial appendage thrombus was detected. Right Atrium: Right atrial size was normal in size. Pericardium: There is no evidence of pericardial effusion. Mitral Valve: The mitral valve is normal in structure. Normal mobility of the mitral valve leaflets. Mild to moderate mitral valve regurgitation. No evidence of mitral valve stenosis. Tricuspid Valve: The tricuspid valve is normal in structure. Tricuspid valve regurgitation is mild to moderate. No evidence of tricuspid stenosis. Aortic Valve: The aortic valve is normal in structure. Aortic valve regurgitation is not visualized. No aortic stenosis  is present. Pulmonic Valve: The pulmonic valve was normal in structure. Pulmonic valve regurgitation is not visualized. No evidence of pulmonic stenosis. Aorta: The aortic root is normal in size and structure. Venous: The inferior vena cava is normal in size with greater than 50% respiratory variability, suggesting right atrial pressure of 3 mmHg. IAS/Shunts: No atrial level shunt detected by color flow Doppler. [name] MD Electronically signed by [name] MD Signature Date/Time: 04/04/2020/12:38:34 PM    Final (Updated)     Subjective: No new complaints.  Discharge Exam: Vitals:   04/10/20 0816 04/10/20 0827  BP:  (!) 89/50  Pulse:  (!) 44  Resp:    Temp: 97.7 F (36.5 C)   SpO2:     Vitals:   04/10/20 0330 04/10/20 0430 04/10/20 0816 04/10/20 0827  BP: 94/62 120/68  (!) 89/50  Pulse: (!) 46 (!) 47  (!) 44  Resp: 18 15    Temp:  98.1 F (36.7 C) 97.7 F (36.5 C)   TempSrc:  Oral Oral   SpO2:  97%     Weight:      Height:        General: Pt is alert, awake, not in acute distress Cardiovascular: RRR, S1/S2 +, no rubs, no gallops Respiratory: CTA bilaterally, no wheezing, no rhonchi Abdominal: Soft, NT, ND, bowel sounds + Extremities: no edema, no cyanosis    The results of significant diagnostics from this hospitalization (including imaging, microbiology, ancillary and laboratory) are listed below for reference.     Microbiology: Recent Results (from the past 240 hour(s))  SARS Coronavirus 2 by RT PCR (hospital order, performed in [facility] hospital lab) Nasopharyngeal Nasopharyngeal Swab     Status: None   Collection Time: 04/01/20  5:49 AM   Specimen: Nasopharyngeal Swab  Result Value Ref Range Status   SARS Coronavirus 2 NEGATIVE NEGATIVE Final    Comment: (NOTE) SARS-CoV-2 target nucleic acids are NOT DETECTED.  The SARS-CoV-2 RNA is generally detectable in upper and lower respiratory specimens during the acute phase of infection. The lowest concentration of SARS-CoV-2 viral copies this assay can detect is 250 copies / mL. A negative result does not preclude SARS-CoV-2 infection and should not be used as the sole basis for treatment or other patient management decisions.  A negative result may occur with improper specimen collection / handling, submission of specimen other than nasopharyngeal swab, presence of viral mutation(s) within the areas targeted by this assay, and inadequate number of viral copies (<250 copies / mL). A negative result must be combined with clinical observations, patient history, and epidemiological information.  Fact Sheet for Patients:   StrictlyIdeas.no  Fact Sheet for Healthcare Providers: BankingDealers.co.za  This test is not yet approved or  cleared by the [place] FDA and has been authorized for detection and/or diagnosis of SARS-CoV-2 by FDA under an Emergency Use Authorization  (EUA).  This EUA will remain in effect (meaning this test can be used) for the duration of the COVID-19 declaration under Section 564(b)(1) of the Act, 21 U.S.C. section 360bbb-3(b)(1), unless the authorization is terminated or revoked sooner.  Performed at [facility], [place] [address], [place], [place] [address]      Labs: BNP (last 3 results) Recent Labs    04/01/20 0924 04/05/20 0949  BNP 180.8* 60.6   Basic Metabolic Panel: Recent Labs  Lab 04/05/20 0632 04/06/20 0801 04/07/20 0808 04/08/20 0518 04/09/20 0238  NA 140 139 141 142 140  K 4.1 4.2  3.8 3.8 3.5  CL 102 105 106 104 105  CO2 24 24 26 28 23   GLUCOSE 245* 189* 136* 142* 135*  BUN 35* 26* 20 19 18   CREATININE 1.19* 0.91 0.85 0.93 0.90  CALCIUM 9.4 9.3 9.5 9.5 9.4   Liver Function Tests: No results for input(s): AST, ALT, ALKPHOS, BILITOT, PROT, ALBUMIN in the last 168 hours. No results for input(s): LIPASE, AMYLASE in the last 168 hours. No results for input(s): AMMONIA in the last 168 hours. CBC: No results for input(s): WBC, NEUTROABS, HGB, HCT, MCV, PLT in the last 168 hours. Cardiac Enzymes: No results for input(s): CKTOTAL, CKMB, CKMBINDEX, TROPONINI in the last 168 hours. BNP: Invalid input(s): POCBNP CBG: Recent Labs  Lab 04/09/20 0558 04/09/20 1121 04/09/20 1651 04/09/20 2109 04/10/20 0619  GLUCAP 151* 115* 164* 176* 122*   D-Dimer No results for input(s): DDIMER in the last 72 hours. Hgb A1c No results for input(s): HGBA1C in the last 72 hours. Lipid Profile No results for input(s): CHOL, HDL, LDLCALC, TRIG, CHOLHDL, LDLDIRECT in the last 72 hours. Thyroid function studies No results for input(s): TSH, T4TOTAL, T3FREE, THYROIDAB in the last 72 hours.  Invalid input(s): FREET3 Anemia work up No results for input(s): VITAMINB12, FOLATE, FERRITIN, TIBC, IRON, RETICCTPCT in the last 72 hours. Urinalysis    Component Value Date/Time   COLORURINE STRAW (A) 11/20/2018 1702    APPEARANCEUR CLEAR 11/20/2018 1702   LABSPEC 1.041 (H) 11/20/2018 1702   PHURINE 6.0 11/20/2018 1702   GLUCOSEU 50 (A) 11/20/2018 1702   HGBUR SMALL (A) 11/20/2018 1702   BILIRUBINUR NEGATIVE 11/20/2018 1702   KETONESUR 20 (A) 11/20/2018 1702   PROTEINUR NEGATIVE 11/20/2018 1702   NITRITE NEGATIVE 11/20/2018 1702   LEUKOCYTESUR NEGATIVE 11/20/2018 1702   Sepsis Labs Invalid input(s): PROCALCITONIN,  WBC,  LACTICIDVEN Microbiology Recent Results (from the past 240 hour(s))  SARS Coronavirus 2 by RT PCR (hospital order, performed in [facility] hospital lab) Nasopharyngeal Nasopharyngeal Swab     Status: None   Collection Time: 04/01/20  5:49 AM   Specimen: Nasopharyngeal Swab  Result Value Ref Range Status   SARS Coronavirus 2 NEGATIVE NEGATIVE Final    Comment: (NOTE) SARS-CoV-2 target nucleic acids are NOT DETECTED.  The SARS-CoV-2 RNA is generally detectable in upper and lower respiratory specimens during the acute phase of infection. The lowest concentration of SARS-CoV-2 viral copies this assay can detect is 250 copies / mL. A negative result does not preclude SARS-CoV-2 infection and should not be used as the sole basis for treatment or other patient management decisions.  A negative result may occur with improper specimen collection / handling, submission of specimen other than nasopharyngeal swab, presence of viral mutation(s) within the areas targeted by this assay, and inadequate number of viral copies (<250 copies / mL). A negative result must be combined with clinical observations, patient history, and epidemiological information.  Fact Sheet for Patients:   StrictlyIdeas.no  Fact Sheet for Healthcare Providers: BankingDealers.co.za  This test is not yet approved or  cleared by the [place] FDA and has been authorized for detection and/or diagnosis of SARS-CoV-2 by FDA under an Emergency Use Authorization (EUA).   This EUA will remain in effect (meaning this test can be used) for the duration of the COVID-19 declaration under Section 564(b)(1) of the Act, 21 U.S.C. section 360bbb-3(b)(1), unless the authorization is terminated or revoked sooner.  Performed at [facility], [place] [address], [place], [place] [address]  Time coordinating discharge: Over 40 minutes  SIGNED:   Charlynne Cousins, MD  [facility] 04/10/2020, 8:32 AM Pager   If 7PM-7AM, please contact night-coverage [email or web address] Password [email or web address]

## 2020-04-10 NOTE — Plan of Care (Signed)
  Problem: Education: Goal: Knowledge of General Education information will improve Description: Including pain rating scale, medication(s)/side effects and non-pharmacologic comfort measures Outcome: Adequate for Discharge   Problem: Health Behavior/Discharge Planning: Goal: Ability to manage health-related needs will improve Outcome: Adequate for Discharge   Problem: Clinical Measurements: Goal: Ability to maintain clinical measurements within normal limits will improve Outcome: Adequate for Discharge Goal: Will remain free from infection Outcome: Adequate for Discharge Goal: Diagnostic test results will improve Outcome: Adequate for Discharge Goal: Respiratory complications will improve Outcome: Adequate for Discharge Goal: Cardiovascular complication will be avoided Outcome: Adequate for Discharge   Problem: Activity: Goal: Risk for activity intolerance will decrease Outcome: Adequate for Discharge   Problem: Nutrition: Goal: Adequate nutrition will be maintained Outcome: Adequate for Discharge   Problem: Coping: Goal: Level of anxiety will decrease Outcome: Adequate for Discharge   Problem: Elimination: Goal: Will not experience complications related to bowel motility Outcome: Adequate for Discharge Goal: Will not experience complications related to urinary retention Outcome: Adequate for Discharge   Problem: Pain Managment: Goal: General experience of comfort will improve Outcome: Adequate for Discharge   Problem: Safety: Goal: Ability to remain free from injury will improve Outcome: Adequate for Discharge   Problem: Skin Integrity: Goal: Risk for impaired skin integrity will decrease Outcome: Adequate for Discharge   Problem: Education: Goal: Knowledge of disease or condition will improve Outcome: Adequate for Discharge Goal: Understanding of medication regimen will improve Outcome: Adequate for Discharge Goal: Individualized Educational  Video(s) Outcome: Adequate for Discharge   Problem: Activity: Goal: Ability to tolerate increased activity will improve Outcome: Adequate for Discharge   Problem: Cardiac: Goal: Ability to achieve and maintain adequate cardiopulmonary perfusion will improve Outcome: Adequate for Discharge   Problem: Health Behavior/Discharge Planning: Goal: Ability to safely manage health-related needs after discharge will improve Outcome: Adequate for Discharge   Problem: Education: Goal: Ability to demonstrate management of disease process will improve Outcome: Adequate for Discharge Goal: Ability to verbalize understanding of medication therapies will improve Outcome: Adequate for Discharge Goal: Individualized Educational Video(s) Outcome: Adequate for Discharge   Problem: Activity: Goal: Capacity to carry out activities will improve Outcome: Adequate for Discharge   Problem: Cardiac: Goal: Ability to achieve and maintain adequate cardiopulmonary perfusion will improve Outcome: Adequate for Discharge

## 2020-04-10 NOTE — Progress Notes (Signed)
D/C instructions given and reviewed. No questions asked. Encouraged to call with any questions. Tele and IV removed, tolerated well.

## 2020-04-10 NOTE — Progress Notes (Signed)
[facility] PROGRESS NOTE    Progress Note  Melissa Bishop  ENI:[id number] DOB: 1946-08-09 DOA: 03/31/2020 PCP: [name], MD     Brief Narrative:   Melissa Bishop is an 74 y.o. female past medical history of essential hypertension hyperlipidemia diabetes mellitus type 2 psoriasis was admitted on 05/02/2020 with progressive dyspnea on exertion, was found to have been in A. fib with RVR chest x-ray showing bilateral opacities and effusion, 2D echo showed an EF of 25% with pulmonary hypertension.  Cardiology was consulted who started IV Lasix and Eliquis.  Patient underwent DCCV on 04/04/2020.  Assessment/Plan:   Newly diagnosed acute on chronic systolic heart failure: With an EF of 20 to 25% on 04/01/2020. TEE showing an EF of 25%, [facility] cardioversion on 04/04/2020. Appreciate cardiology's assistance, Lasix and Aldactone were held on 04/05/2020. Resume oral Lasix, electrolytes have been stable, she is only positive but a liter.  New onset atrial fibrillation with rapid ventricular response (HCC) Underwent DCCV as above, she is currently on amiodarone and metoprolol. Now transition to oral amiodarone and metoprolol her heart rate has been in the 40s when sleeping wonder if we should backed off slightly on the metoprolol.  Will discuss with cardiology. Physical therapy evaluated the patient and recommended home health PT.  Diabetes mellitus type 2: Continue sliding scale insulin.  Constipation: Continue MiraLAX as needed.  Essential hypertension Blood pressure is essentially well controlled.   DVT prophylaxis: eliquis Family Communication:none Status is: Inpatient  Remains inpatient appropriate because:Hemodynamically unstable   Dispo: The patient is from: Home              Anticipated d/c is to: Home              Anticipated d/c date is: 1 days              Patient currently is not medically stable to d/c.  Once rate controlled we can start thinking about discharging  her.   Code Status:     Code Status Orders  (From admission, onward)         Start     Ordered   04/01/20 0818  Full code  Continuous        04/01/20 0820        Code Status History    This patient has a current code status but no historical code status.   Advance Care Planning Activity        IV Access:    Peripheral IV   Procedures and diagnostic studies:   No results found.   Medical Consultants:    None.  Anti-Infectives:   none  Subjective:    Curlene Dolphin no events overnight she relates she feels fine with ambulating and going to the bathroom she had a good night sleep. Objective:    Vitals:   04/10/20 0130 04/10/20 0230 04/10/20 0330 04/10/20 0430  BP: (!) 92/59 (!) 100/54 94/62 120/68  Pulse: (!) 40 (!) 42 (!) 46 (!) 47  Resp: 14 14 18 15   Temp:    98.1 F (36.7 C)  TempSrc:    Oral  SpO2: 98% 98%  97%  Weight:      Height:       SpO2: 97 % O2 Flow Rate (L/min): 6 L/min   Intake/Output Summary (Last 24 hours) at 04/10/2020 0656 Last data filed at 04/09/2020 2104 Gross per 24 hour  Intake 840 ml  Output 1475 ml  Net -635 ml   [employer]  04/08/20 0344 04/09/20 0428 04/10/20 0100  Weight: 101.8 kg 102 kg 101 kg    Exam: General exam: In no acute distress. Respiratory system: Good air movement and clear to auscultation. Cardiovascular system: S1 & S2 heard, RRR. No JVD. Gastrointestinal system: Abdomen is nondistended, soft and nontender.  Extremities: No pedal edema. Skin: No rashes, lesions or ulcers  Data Reviewed:    Labs: Basic Metabolic Panel: Recent Labs  Lab 04/05/20 0632 04/05/20 8144 04/06/20 0801 04/06/20 0801 04/07/20 0808 04/07/20 0808 04/08/20 0518 04/09/20 0238  NA 140  --  139  --  141  --  142 140  K 4.1   < > 4.2   < > 3.8   < > 3.8 3.5  CL 102  --  105  --  106  --  104 105  CO2 24  --  24  --  26  --  28 23  GLUCOSE 245*  --  189*  --  136*  --  142* 135*  BUN 35*  --  26*  --  20   --  19 18  CREATININE 1.19*  --  0.91  --  0.85  --  0.93 0.90  CALCIUM 9.4  --  9.3  --  9.5  --  9.5 9.4   < > = values in this interval not displayed.   GFR Estimated Creatinine Clearance: 71.8 mL/min (by C-G formula based on SCr of 0.9 mg/dL). Liver Function Tests: No results for input(s): AST, ALT, ALKPHOS, BILITOT, PROT, ALBUMIN in the last 168 hours. No results for input(s): LIPASE, AMYLASE in the last 168 hours. No results for input(s): AMMONIA in the last 168 hours. Coagulation profile Recent Labs  Lab 04/04/20 1027  INR 1.3*   COVID-19 Labs  No results for input(s): DDIMER, FERRITIN, LDH, CRP in the last 72 hours.  Lab Results  Component Value Date   [place] NEGATIVE 04/01/2020    CBC: No results for input(s): WBC, NEUTROABS, HGB, HCT, MCV, PLT in the last 168 hours. Cardiac Enzymes: No results for input(s): CKTOTAL, CKMB, CKMBINDEX, TROPONINI in the last 168 hours. BNP (last 3 results) No results for input(s): PROBNP in the last 8760 hours. CBG: Recent Labs  Lab 04/09/20 0558 04/09/20 1121 04/09/20 1651 04/09/20 2109 04/10/20 0619  GLUCAP 151* 115* 164* 176* 122*   D-Dimer: No results for input(s): DDIMER in the last 72 hours. Hgb A1c: No results for input(s): HGBA1C in the last 72 hours. Lipid Profile: No results for input(s): CHOL, HDL, LDLCALC, TRIG, CHOLHDL, LDLDIRECT in the last 72 hours. Thyroid function studies: No results for input(s): TSH, T4TOTAL, T3FREE, THYROIDAB in the last 72 hours.  Invalid input(s): FREET3 Anemia work up: No results for input(s): VITAMINB12, FOLATE, FERRITIN, TIBC, IRON, RETICCTPCT in the last 72 hours. Sepsis Labs: No results for input(s): PROCALCITON, WBC, LATICACIDVEN in the last 168 hours. Microbiology Recent Results (from the past 240 hour(s))  SARS Coronavirus 2 by RT PCR (hospital order, performed in [facility] hospital lab) Nasopharyngeal Nasopharyngeal Swab     Status: None   Collection Time: 04/01/20   5:49 AM   Specimen: Nasopharyngeal Swab  Result Value Ref Range Status   SARS Coronavirus 2 NEGATIVE NEGATIVE Final    Comment: (NOTE) SARS-CoV-2 target nucleic acids are NOT DETECTED.  The SARS-CoV-2 RNA is generally detectable in upper and lower respiratory specimens during the acute phase of infection. The lowest concentration of SARS-CoV-2 viral copies this assay can detect is 250 copies /  mL. A negative result does not preclude SARS-CoV-2 infection and should not be used as the sole basis for treatment or other patient management decisions.  A negative result may occur with improper specimen collection / handling, submission of specimen other than nasopharyngeal swab, presence of viral mutation(s) within the areas targeted by this assay, and inadequate number of viral copies (<250 copies / mL). A negative result must be combined with clinical observations, patient history, and epidemiological information.  Fact Sheet for Patients:   StrictlyIdeas.no  Fact Sheet for Healthcare Providers: BankingDealers.co.za  This test is not yet approved or  cleared by the [place] FDA and has been authorized for detection and/or diagnosis of SARS-CoV-2 by FDA under an Emergency Use Authorization (EUA).  This EUA will remain in effect (meaning this test can be used) for the duration of the COVID-19 declaration under Section 564(b)(1) of the Act, 21 U.S.C. section 360bbb-3(b)(1), unless the authorization is terminated or revoked sooner.  Performed at [facility], [place] [address] [place], [place] [address]      Medications:   . amiodarone  200 mg Oral BID   Followed by  . [START ON 04/17/2020] amiodarone  200 mg Oral Daily  . apixaban  5 mg Oral BID  . atorvastatin  80 mg Oral Daily  . furosemide  40 mg Oral Daily  . insulin aspart  0-15 Units Subcutaneous TID WC  . insulin aspart  0-5 Units Subcutaneous QHS  . lisinopril  5  mg Oral Daily  . metoprolol succinate  25 mg Oral Daily  . sodium chloride flush  3 mL Intravenous Q12H  . spironolactone  12.5 mg Oral Daily   Continuous Infusions: . sodium chloride        LOS: 9 days   Charlynne Cousins  [facility]  04/10/2020, 6:56 AM

## 2020-04-10 NOTE — Progress Notes (Signed)
Pt HR 30-40's sustaining. BP 103/51. Pt asymptomatic. EKG obtained showing sinus brady. Spoke with cardiology, was told to hold amio drip at this time, will re-evaluate in the morning, unless HR goes back up to 100's. Pt has no complains at this time. Will continue to monitor.

## 2020-04-11 ENCOUNTER — Telehealth: Payer: Self-pay

## 2020-04-11 DIAGNOSIS — K5900 Constipation, unspecified: Secondary | ICD-10-CM | POA: Diagnosis not present

## 2020-04-11 DIAGNOSIS — E119 Type 2 diabetes mellitus without complications: Secondary | ICD-10-CM | POA: Diagnosis not present

## 2020-04-11 DIAGNOSIS — I270 Primary pulmonary hypertension: Secondary | ICD-10-CM | POA: Diagnosis not present

## 2020-04-11 DIAGNOSIS — Z7984 Long term (current) use of oral hypoglycemic drugs: Secondary | ICD-10-CM | POA: Diagnosis not present

## 2020-04-11 DIAGNOSIS — I5023 Acute on chronic systolic (congestive) heart failure: Secondary | ICD-10-CM | POA: Diagnosis not present

## 2020-04-11 DIAGNOSIS — E7800 Pure hypercholesterolemia, unspecified: Secondary | ICD-10-CM | POA: Diagnosis not present

## 2020-04-11 DIAGNOSIS — I110 Hypertensive heart disease with heart failure: Secondary | ICD-10-CM | POA: Diagnosis not present

## 2020-04-11 DIAGNOSIS — L409 Psoriasis, unspecified: Secondary | ICD-10-CM | POA: Diagnosis not present

## 2020-04-11 DIAGNOSIS — I4891 Unspecified atrial fibrillation: Secondary | ICD-10-CM | POA: Diagnosis not present

## 2020-04-11 DIAGNOSIS — Z7901 Long term (current) use of anticoagulants: Secondary | ICD-10-CM | POA: Diagnosis not present

## 2020-04-11 NOTE — Telephone Encounter (Signed)
Called patient to get scheduled with Dr.O'Neal- I advised to call back to speak to me so I could get her set up to see him in the next few weeks.  Left call back number.

## 2020-04-13 DIAGNOSIS — K5900 Constipation, unspecified: Secondary | ICD-10-CM | POA: Diagnosis not present

## 2020-04-13 DIAGNOSIS — I4891 Unspecified atrial fibrillation: Secondary | ICD-10-CM | POA: Diagnosis not present

## 2020-04-13 DIAGNOSIS — E119 Type 2 diabetes mellitus without complications: Secondary | ICD-10-CM | POA: Diagnosis not present

## 2020-04-13 DIAGNOSIS — I270 Primary pulmonary hypertension: Secondary | ICD-10-CM | POA: Diagnosis not present

## 2020-04-13 DIAGNOSIS — I110 Hypertensive heart disease with heart failure: Secondary | ICD-10-CM | POA: Diagnosis not present

## 2020-04-13 DIAGNOSIS — I5023 Acute on chronic systolic (congestive) heart failure: Secondary | ICD-10-CM | POA: Diagnosis not present

## 2020-04-14 DIAGNOSIS — I110 Hypertensive heart disease with heart failure: Secondary | ICD-10-CM | POA: Diagnosis not present

## 2020-04-14 DIAGNOSIS — I4891 Unspecified atrial fibrillation: Secondary | ICD-10-CM | POA: Diagnosis not present

## 2020-04-14 DIAGNOSIS — I5023 Acute on chronic systolic (congestive) heart failure: Secondary | ICD-10-CM | POA: Diagnosis not present

## 2020-04-14 DIAGNOSIS — K5900 Constipation, unspecified: Secondary | ICD-10-CM | POA: Diagnosis not present

## 2020-04-14 DIAGNOSIS — E119 Type 2 diabetes mellitus without complications: Secondary | ICD-10-CM | POA: Diagnosis not present

## 2020-04-14 DIAGNOSIS — I270 Primary pulmonary hypertension: Secondary | ICD-10-CM | POA: Diagnosis not present

## 2020-04-15 DIAGNOSIS — Z23 Encounter for immunization: Secondary | ICD-10-CM | POA: Diagnosis not present

## 2020-04-15 DIAGNOSIS — I4891 Unspecified atrial fibrillation: Secondary | ICD-10-CM | POA: Diagnosis not present

## 2020-04-15 DIAGNOSIS — E1159 Type 2 diabetes mellitus with other circulatory complications: Secondary | ICD-10-CM | POA: Diagnosis not present

## 2020-04-15 DIAGNOSIS — I5021 Acute systolic (congestive) heart failure: Secondary | ICD-10-CM | POA: Diagnosis not present

## 2020-04-15 DIAGNOSIS — Z79899 Other long term (current) drug therapy: Secondary | ICD-10-CM | POA: Diagnosis not present

## 2020-04-15 DIAGNOSIS — N289 Disorder of kidney and ureter, unspecified: Secondary | ICD-10-CM | POA: Diagnosis not present

## 2020-04-15 DIAGNOSIS — L409 Psoriasis, unspecified: Secondary | ICD-10-CM | POA: Diagnosis not present

## 2020-04-15 DIAGNOSIS — I10 Essential (primary) hypertension: Secondary | ICD-10-CM | POA: Diagnosis not present

## 2020-04-15 DIAGNOSIS — E785 Hyperlipidemia, unspecified: Secondary | ICD-10-CM | POA: Diagnosis not present

## 2020-04-15 DIAGNOSIS — M810 Age-related osteoporosis without current pathological fracture: Secondary | ICD-10-CM | POA: Diagnosis not present

## 2020-04-19 DIAGNOSIS — K5900 Constipation, unspecified: Secondary | ICD-10-CM | POA: Diagnosis not present

## 2020-04-19 DIAGNOSIS — I110 Hypertensive heart disease with heart failure: Secondary | ICD-10-CM | POA: Diagnosis not present

## 2020-04-19 DIAGNOSIS — E119 Type 2 diabetes mellitus without complications: Secondary | ICD-10-CM | POA: Diagnosis not present

## 2020-04-19 DIAGNOSIS — I270 Primary pulmonary hypertension: Secondary | ICD-10-CM | POA: Diagnosis not present

## 2020-04-19 DIAGNOSIS — I5023 Acute on chronic systolic (congestive) heart failure: Secondary | ICD-10-CM | POA: Diagnosis not present

## 2020-04-19 DIAGNOSIS — I4891 Unspecified atrial fibrillation: Secondary | ICD-10-CM | POA: Diagnosis not present

## 2020-04-20 NOTE — Telephone Encounter (Signed)
Appointment with Dr.O'Neal on Friday 09/10.  Patient aware of date and time.

## 2020-04-21 DIAGNOSIS — I4891 Unspecified atrial fibrillation: Secondary | ICD-10-CM | POA: Diagnosis not present

## 2020-04-21 DIAGNOSIS — I110 Hypertensive heart disease with heart failure: Secondary | ICD-10-CM | POA: Diagnosis not present

## 2020-04-21 DIAGNOSIS — E119 Type 2 diabetes mellitus without complications: Secondary | ICD-10-CM | POA: Diagnosis not present

## 2020-04-21 DIAGNOSIS — I270 Primary pulmonary hypertension: Secondary | ICD-10-CM | POA: Diagnosis not present

## 2020-04-21 DIAGNOSIS — I5023 Acute on chronic systolic (congestive) heart failure: Secondary | ICD-10-CM | POA: Diagnosis not present

## 2020-04-21 DIAGNOSIS — K5900 Constipation, unspecified: Secondary | ICD-10-CM | POA: Diagnosis not present

## 2020-04-21 NOTE — Progress Notes (Signed)
Cardiology Office Note:   Date:  04/22/2020  NAME:  Melissa Bishop    MRN: [id number] DOB:  1945-08-27   PCP:  [name], MD  Cardiologist:  Evalina Field, MD   Referring MD: [name], MD   Chief Complaint  Patient presents with   Follow-up   History of Present Illness:   Melissa Bishop is a 74 y.o. female with a hx of HTN, DM, CHF, Afib who presents for follow-up. Recent admission to the hospital with Afib with RVR and new onset systolic HF. Failed DCCV and converted to NSR on amiodarone.   She presents for follow-up.  She reports has been doing well.  Her weights continue to remain stable.  She is not gaining weight.  She is had no increased lower extremity edema.  She is working with physical therapy at home 2 times per week.  These include 1 hour sessions of walking and exercise.  She denies any chest pain or shortness of breath with this level of activity.  Her EKG does demonstrate that she is back in atrial fibrillation.  She is unaware of this.  She remains on amiodarone and metoprolol.  Heart rate is around 96.  She is working her diabetes.  She is also working on losing weight.  We did discuss the possibility of sleep apnea.  She reports she does not snore and has no apneic episodes.  She presents with her mother who reports that this is also true.  Her mother does have A. fib and a pacemaker.  Overall seems to be stable just back in A. fib.  Problem List 1. Atrial fibrillation, persistent -TEE/DCCV 04/05/2020 failed -converted to [facility] on amiodarone  2. Systolic HF, EF 40-98% -arrhythmia related  3. Diabetes -A1c 7.5 4. HLD -T chol 200, HDL 37, LDL 142, TG 104  Past Medical History: Past Medical History:  Diagnosis Date   CHF (congestive heart failure) (HCC)    Coronary artery disease    Diabetes mellitus without complication (HCC)    High cholesterol    Hypertension    Psoriasis     Past Surgical History: Past Surgical History:  Procedure  Laterality Date   BREAST EXCISIONAL BIOPSY Right ? more than 30 years   BREAST SURGERY     CARDIOVERSION N/A 04/04/2020   Procedure: CARDIOVERSION;  Surgeon: Jerline Pain, MD;  Location: [place];  Service: Cardiovascular;  Laterality: N/A;   TEE WITHOUT CARDIOVERSION N/A 04/04/2020   Procedure: TRANSESOPHAGEAL ECHOCARDIOGRAM (TEE);  Surgeon: Jerline Pain, MD;  [facility] ENDOSCOPY;  Service: Cardiovascular;  Laterality: N/A;    Current Medications: Current Meds  Medication Sig   amiodarone (PACERONE) 200 MG tablet Take 1 tablet (200 mg total) by mouth daily.   apixaban (ELIQUIS) 5 MG TABS tablet Take 1 tablet (5 mg total) by mouth 2 (two) times daily.   atorvastatin (LIPITOR) 80 MG tablet Take 80 mg by mouth daily.   furosemide (LASIX) 40 MG tablet Take 1 tablet (40 mg total) by mouth daily.   lisinopril (PRINIVIL,ZESTRIL) 20 MG tablet Take 20 mg by mouth daily.    metFORMIN (GLUCOPHAGE) 500 MG tablet Take 500 mg by mouth 2 (two) times daily with a meal.   metoprolol succinate (TOPROL-XL) 50 MG 24 hr tablet Take 1 tablet (50 mg total) by mouth daily.   Multiple Vitamins-Minerals (CENTRUM SILVER ADULT 50+ PO) Take 1 tablet by mouth daily.   Multiple Vitamins-Minerals (PRESERVISION AREDS 2) CAPS Take 1 capsule by mouth daily.  spironolactone (ALDACTONE) 25 MG tablet Take 0.5 tablets (12.5 mg total) by mouth daily.   Ustekinumab (STELARA) 45 MG/0.5ML SOLN Inject 45 mg into the skin every 3 (three) months.    [DISCONTINUED] amiodarone (PACERONE) 200 MG tablet Take 1 tablet (200 mg total) by mouth 2 (two) times daily.   [DISCONTINUED] apixaban (ELIQUIS) 5 MG TABS tablet Take 5 mg by mouth 2 (two) times daily.   [DISCONTINUED] furosemide (LASIX) 40 MG tablet Take 1 tablet (40 mg total) by mouth daily.   [DISCONTINUED] metoprolol succinate (TOPROL-XL) 25 MG 24 hr tablet Take 1 tablet (25 mg total) by mouth daily.   [DISCONTINUED] spironolactone (ALDACTONE) 25 MG  tablet Take 0.5 tablets (12.5 mg total) by mouth daily.     Allergies:    Patient has no known allergies.   Social History: Social History   Socioeconomic History   Marital status: Single    Spouse name: Not on file   Number of children: Not on file   Years of education: Not on file   Highest education level: Not on file  Occupational History   Not on file  Tobacco Use   Smoking status: Former Smoker   Smokeless tobacco: Never Used  Substance and Sexual Activity   Alcohol use: No   Drug use: No   Sexual activity: Not on file  Other Topics Concern   Not on file  Social History Narrative   Not on file   Social Determinants of Health   Financial Resource Strain:    Difficulty of Paying Living Expenses: Not on file  Food Insecurity:    Worried About [place] in the Last Year: Not on file   [employer] of Food in the Last Year: Not on file  Transportation Needs:    Lack of Transportation (Medical): Not on file   Lack of Transportation (Non-Medical): Not on file  Physical Activity:    Days of Exercise per Week: Not on file   Minutes of Exercise per Session: Not on file  Stress:    Feeling of Stress : Not on file  Social Connections:    Frequency of Communication with Friends and Family: Not on file   Frequency of Social Gatherings with Friends and Family: Not on file   Attends Religious Services: Not on file   Active Member of Clubs or Organizations: Not on file   Attends Archivist Meetings: Not on file   Marital Status: Not on file     Family History: The patient's family history includes Heart disease in her mother.  ROS:   All other ROS reviewed and negative. Pertinent positives noted in the HPI.     EKGs/Labs/Other Studies Reviewed:   The following studies were personally reviewed by me today:  EKG:  EKG is ordered today.  The ekg ordered today demonstrates atrial fibrillation with heart rate 96, right bundle  branch block, left anterior fascicular block noted, and was personally reviewed by me.   TTE 04/01/2020 1. Left ventricular ejection fraction, by estimation, is 25%. The left  ventricle has severely decreased function. Global hypokinesis. Left  ventricular endocardial border not optimally defined to evaluate regional  wall motion despite the use of Definity  contrast. Left ventricular diastolic parameters are indeterminate.  2. Right ventricular systolic function is severely reduced. The right  ventricular size is mildly enlarged. There is mildly elevated pulmonary  artery systolic pressure. The estimated right ventricular systolic  pressure is 93.8 mmHg.  3. Left atrial  size was mildly dilated.  4. Right atrial size was mildly dilated.  5. The mitral valve is grossly normal. No evidence of mitral valve  regurgitation.  6. The aortic valve is grossly normal. Aortic valve regurgitation is not  visualized.  7. The inferior vena cava is dilated in size with <50% respiratory  variability, suggesting right atrial pressure of 15 mmHg.   Recent Labs: 04/01/2020: Magnesium 2.0; TSH 1.866 04/02/2020: Hemoglobin 13.9; Platelets 214 04/05/2020: B Natriuretic Peptide 57.9 04/10/2020: BUN 18; Creatinine, Ser 1.11; Potassium 4.4; Sodium 141   Recent Lipid Panel    Component Value Date/Time   CHOL 200 04/02/2020 0231   TRIG 104 04/02/2020 0231   HDL 37 (L) 04/02/2020 0231   CHOLHDL 5.4 04/02/2020 0231   VLDL 21 04/02/2020 0231   LDLCALC 142 (H) 04/02/2020 0231    Physical Exam:   VS:  BP 114/80    Pulse 96    Ht 5\' 11"  (1.803 m)    Wt 221 lb 12.8 oz (100.6 kg)    SpO2 97%    BMI 30.93 kg/m    Wt Readings from Last 3 Encounters:  04/22/20 221 lb 12.8 oz (100.6 kg)  04/10/20 222 lb 9.6 oz (101 kg)  11/20/18 180 lb (81.6 kg)    General: Well nourished, well developed, in no acute distress Heart: Atraumatic, normal size  Eyes: PEERLA, EOMI  Neck: Supple, no JVD Endocrine: No  thryomegaly Cardiac: Normal S1, S2; irregular rhythm, no murmurs rubs or gallops Lungs: Clear to auscultation bilaterally, no wheezing, rhonchi or rales  Abd: Soft, nontender, no hepatomegaly  Ext: Trace edema Musculoskeletal: No deformities, BUE and BLE strength normal and equal Skin: Warm and dry, no rashes   Neuro: Alert and oriented to person, place, time, and situation, CNII-XII grossly intact, no focal deficits  Psych: Normal mood and affect   ASSESSMENT:   Melissa Bishop is a 74 y.o. female who presents for the following: 1. Persistent atrial fibrillation ([place])   2. Chronic systolic heart failure ([place])   3. Essential hypertension   4. Mixed hyperlipidemia     PLAN:   1. Persistent atrial fibrillation ([place]) 2. Chronic systolic heart failure ([place]) -She was recently admitted with A. fib with RVR and found to have congestive heart failure.  She failed TEE/cardioversion.  She was then placed on amiodarone and converted back to sinus rhythm. -She presents today back in A. fib.  This is despite being on amiodarone.  She did have bradycardia in the hospital as well when she was in sinus rhythm.  Overall I think she needs to be considered for cardiac ablation.  We will refer her to EP.  Given that she has failed 2 cardioversions and is on amiodarone we may need to help.  They will weigh in on her candidacy for ablation.  Other option would be to ablate and pace.  We will see what they say. -I would also like for her to complete a cardiac stress test to exclude any ischemia for the etiology of her heart failure.  I highly suspect this is all arrhythmia related but we will make sure. -She is euvolemic on exam.  She will continue 40 mg of Lasix daily.  Continue lisinopril 20 mg daily and Aldactone 12.5 mg daily.  Increase metoprolol succinate to 50 mg for better rate control. -She will continue Eliquis 5 mg twice daily.  No bleeding issues reported. -I will see her back in 2 months.  3.  Essential hypertension -  Continue current medications.  Blood pressure well controlled.  4. Mixed hyperlipidemia -We will recheck lipid profile in a few months.  Continue Lipitor 80 mg daily.  Disposition: Return in about 2 months (around 06/22/2020).  Medication Adjustments/Labs and Tests Ordered: Current medicines are reviewed at length with the patient today.  Concerns regarding medicines are outlined above.  Orders Placed This Encounter  Procedures   Ambulatory referral to Cardiac Electrophysiology   MYOCARDIAL PERFUSION IMAGING   EKG 12-Lead   Meds ordered this encounter  Medications   amiodarone (PACERONE) 200 MG tablet    Sig: Take 1 tablet (200 mg total) by mouth daily.    Dispense:  90 tablet    Refill:  1   apixaban (ELIQUIS) 5 MG TABS tablet    Sig: Take 1 tablet (5 mg total) by mouth 2 (two) times daily.    Dispense:  90 tablet    Refill:  1   metoprolol succinate (TOPROL-XL) 50 MG 24 hr tablet    Sig: Take 1 tablet (50 mg total) by mouth daily.    Dispense:  90 tablet    Refill:  1   furosemide (LASIX) 40 MG tablet    Sig: Take 1 tablet (40 mg total) by mouth daily.    Dispense:  90 tablet    Refill:  1   spironolactone (ALDACTONE) 25 MG tablet    Sig: Take 0.5 tablets (12.5 mg total) by mouth daily.    Dispense:  30 tablet    Refill:  3    Patient Instructions  Medication Instructions:  Increase Metoprolol to 50 mg daily  Decrease Amiodarone to 200 mg daily   *If you need a refill on your cardiac medications before your next appointment, please call your pharmacy*  Testing:  Your physician has requested that you have a lexiscan myoview. A cardiac stress test is a cardiological test that measures the heart's ability to respond to external stress in a controlled clinical environment. The stress response is induced by intravenous pharmacological stimulation.     Follow-Up: At [facility], you and your health needs are our priority.  As part of  our continuing mission to provide you with exceptional heart care, we have created designated Provider Care Teams.  These Care Teams include your primary Cardiologist (physician) and Advanced Practice Providers (APPs -  Physician Assistants and Nurse Practitioners) who all work together to provide you with the care you need, when you need it.  We recommend signing up for the patient portal called "MyChart".  Sign up information is provided on this After Visit Summary.  MyChart is used to connect with patients for Virtual Visits (Telemedicine).  Patients are able to view lab/test results, encounter notes, upcoming appointments, etc.  Non-urgent messages can be sent to your provider as well.   To learn more about what you can do with MyChart, go to [email or web address].    Your next appointment:   2 month(s)  The format for your next appointment:   In Person  Provider:   Eleonore Chiquito, MD   Other Instructions Referral to EP- they will call to set up an appointment.     Time Spent with Patient: I have spent a total of 35 minutes with patient reviewing hospital notes, telemetry, EKGs, labs and examining the patient as well as establishing an assessment and plan that was discussed with the patient.  > 50% of time was spent in direct patient care.  Signed, Addison Naegeli. Audie Box, [place]  [facility]  [address], [place], [place] [address] [phone]  04/22/2020 11:22 AM

## 2020-04-22 ENCOUNTER — Other Ambulatory Visit: Payer: Self-pay

## 2020-04-22 ENCOUNTER — Ambulatory Visit (INDEPENDENT_AMBULATORY_CARE_PROVIDER_SITE_OTHER): Payer: Medicare Other | Admitting: Cardiovascular Disease

## 2020-04-22 ENCOUNTER — Encounter: Payer: Self-pay | Admitting: Cardiovascular Disease

## 2020-04-22 VITALS — BP 114/80 | HR 96 | Ht 71.0 in | Wt 221.8 lb

## 2020-04-22 DIAGNOSIS — E782 Mixed hyperlipidemia: Secondary | ICD-10-CM | POA: Diagnosis not present

## 2020-04-22 DIAGNOSIS — I10 Essential (primary) hypertension: Secondary | ICD-10-CM | POA: Diagnosis not present

## 2020-04-22 DIAGNOSIS — I4819 Other persistent atrial fibrillation: Secondary | ICD-10-CM | POA: Diagnosis not present

## 2020-04-22 DIAGNOSIS — I5022 Chronic systolic (congestive) heart failure: Secondary | ICD-10-CM

## 2020-04-22 MED ORDER — SPIRONOLACTONE 25 MG PO TABS
12.5000 mg | ORAL_TABLET | Freq: Every day | ORAL | 3 refills | Status: DC
Start: 2020-04-22 — End: 2020-10-27

## 2020-04-22 MED ORDER — APIXABAN 5 MG PO TABS: 5.0000 mg | ORAL_TABLET | Freq: Two times a day (BID) | ORAL | 1 refills | Status: AC

## 2020-04-22 MED ORDER — AMIODARONE HCL 200 MG PO TABS
200.0000 mg | ORAL_TABLET | Freq: Every day | ORAL | 1 refills | Status: DC
Start: 2020-04-22 — End: 2020-05-17

## 2020-04-22 MED ORDER — METOPROLOL SUCCINATE ER 50 MG PO TB24: 50.0000 mg | ORAL_TABLET | Freq: Every day | ORAL | 1 refills | Status: DC

## 2020-04-22 MED ORDER — FUROSEMIDE 40 MG PO TABS
40.0000 mg | ORAL_TABLET | Freq: Every day | ORAL | 1 refills | Status: DC
Start: 2020-04-22 — End: 2020-05-12

## 2020-04-22 NOTE — Patient Instructions (Addendum)
Medication Instructions:  Increase Metoprolol to 50 mg daily  Decrease Amiodarone to 200 mg daily   *If you need a refill on your cardiac medications before your next appointment, please call your pharmacy*  Testing:  Your physician has requested that you have a lexiscan myoview. A cardiac stress test is a cardiological test that measures the heart's ability to respond to external stress in a controlled clinical environment. The stress response is induced by intravenous pharmacological stimulation.     Follow-Up: At [facility], you and your health needs are our priority.  As part of our continuing mission to provide you with exceptional heart care, we have created designated Provider Care Teams.  These Care Teams include your primary Cardiologist (physician) and Advanced Practice Providers (APPs -  Physician Assistants and Nurse Practitioners) who all work together to provide you with the care you need, when you need it.  We recommend signing up for the patient portal called "MyChart".  Sign up information is provided on this After Visit Summary.  MyChart is used to connect with patients for Virtual Visits (Telemedicine).  Patients are able to view lab/test results, encounter notes, upcoming appointments, etc.  Non-urgent messages can be sent to your provider as well.   To learn more about what you can do with MyChart, go to [email or web address].    Your next appointment:   2 month(s)  The format for your next appointment:   In Person  Provider:   Eleonore Chiquito, MD   Other Instructions Referral to EP- they will call to set up an appointment.

## 2020-04-25 DIAGNOSIS — I5023 Acute on chronic systolic (congestive) heart failure: Secondary | ICD-10-CM | POA: Diagnosis not present

## 2020-04-25 DIAGNOSIS — I4891 Unspecified atrial fibrillation: Secondary | ICD-10-CM | POA: Diagnosis not present

## 2020-04-25 DIAGNOSIS — K5900 Constipation, unspecified: Secondary | ICD-10-CM | POA: Diagnosis not present

## 2020-04-25 DIAGNOSIS — E119 Type 2 diabetes mellitus without complications: Secondary | ICD-10-CM | POA: Diagnosis not present

## 2020-04-25 DIAGNOSIS — I110 Hypertensive heart disease with heart failure: Secondary | ICD-10-CM | POA: Diagnosis not present

## 2020-04-25 DIAGNOSIS — I270 Primary pulmonary hypertension: Secondary | ICD-10-CM | POA: Diagnosis not present

## 2020-04-26 ENCOUNTER — Ambulatory Visit (INDEPENDENT_AMBULATORY_CARE_PROVIDER_SITE_OTHER): Payer: Medicare Other | Admitting: Podiatry

## 2020-04-26 ENCOUNTER — Other Ambulatory Visit: Payer: Self-pay

## 2020-04-26 ENCOUNTER — Encounter: Payer: Self-pay | Admitting: Podiatry

## 2020-04-26 DIAGNOSIS — M79674 Pain in right toe(s): Secondary | ICD-10-CM | POA: Diagnosis not present

## 2020-04-26 DIAGNOSIS — L84 Corns and callosities: Secondary | ICD-10-CM

## 2020-04-26 DIAGNOSIS — E119 Type 2 diabetes mellitus without complications: Secondary | ICD-10-CM

## 2020-04-26 DIAGNOSIS — B351 Tinea unguium: Secondary | ICD-10-CM

## 2020-04-26 DIAGNOSIS — M79675 Pain in left toe(s): Secondary | ICD-10-CM

## 2020-04-27 DIAGNOSIS — E119 Type 2 diabetes mellitus without complications: Secondary | ICD-10-CM | POA: Diagnosis not present

## 2020-04-27 DIAGNOSIS — I270 Primary pulmonary hypertension: Secondary | ICD-10-CM | POA: Diagnosis not present

## 2020-04-27 DIAGNOSIS — I110 Hypertensive heart disease with heart failure: Secondary | ICD-10-CM | POA: Diagnosis not present

## 2020-04-27 DIAGNOSIS — I5023 Acute on chronic systolic (congestive) heart failure: Secondary | ICD-10-CM | POA: Diagnosis not present

## 2020-04-27 DIAGNOSIS — K5900 Constipation, unspecified: Secondary | ICD-10-CM | POA: Diagnosis not present

## 2020-04-27 DIAGNOSIS — I4891 Unspecified atrial fibrillation: Secondary | ICD-10-CM | POA: Diagnosis not present

## 2020-04-28 ENCOUNTER — Telehealth (HOSPITAL_COMMUNITY): Payer: Self-pay

## 2020-04-28 NOTE — H&P (View-Only) (Signed)
Electrophysiology Office Note:    Date:  04/29/2020   ID:  Melissa Bishop, DOB [birth year], MRN [id number]  PCP:  [name], [facility] Cardiologist:  Evalina Field, MD  [facility] Electrophysiologist:  Vickie Epley, MD   Referring MD: Geralynn Rile, *   Chief Complaint: AF  History of Present Illness:    Melissa Bishop is a 74 y.o. female who presents for an evaluation of atrial fibrillation at the request of Dr Audie Box. Their medical history includes HTN, DM, HF and AF on amiodarone. She did have a recent admission for AF w/ RVR. She initially was managed with TEE/DCCV but had ERAF so was started on amiodarone. At her last clinic appointment on 9/10 she was back in atrial fibrillation.   She has had atrial fibrillation dating back to at least 2018. She cannot reliably tell when she is in atrial fibrillation although she thinks she was in afib for a prolonged amount of time leading to the most recent cardioversion. She has no problem with the eliquis or amiodarone. She tells me that she feels weak and tires easily which she attributes to the decreased EF. She knows "something has to be done".  Her mother who is with her today in clinic has atrial fibrillation and a pacemaker.  Past Medical History:  Diagnosis Date  . CHF (congestive heart failure) ([place])   . Coronary artery disease   . Diabetes mellitus without complication ([place])   . High cholesterol   . Hypertension   . Psoriasis     Past Surgical History:  Procedure Laterality Date  . BREAST EXCISIONAL BIOPSY Right ? more than 30 years  . BREAST SURGERY    . CARDIOVERSION N/A 04/04/2020   Procedure: CARDIOVERSION;  Surgeon: Jerline Pain, MD;  Location[facility] ENDOSCOPY;  Service: Cardiovascular;  Laterality: N/A;  . TEE WITHOUT CARDIOVERSION N/A 04/04/2020   Procedure: TRANSESOPHAGEAL ECHOCARDIOGRAM (TEE);  Surgeon: Jerline Pain, MD;  Location: [facility] ENDOSCOPY;  Service: Cardiovascular;  Laterality: N/A;     Current Medications: Current Meds  Medication Sig  . amiodarone (PACERONE) 200 MG tablet Take 1 tablet (200 mg total) by mouth daily.  Marland Kitchen apixaban (ELIQUIS) 5 MG TABS tablet Take 1 tablet (5 mg total) by mouth 2 (two) times daily.  Marland Kitchen atorvastatin (LIPITOR) 80 MG tablet Take 80 mg by mouth daily.  . furosemide (LASIX) 40 MG tablet Take 1 tablet (40 mg total) by mouth daily.  Marland Kitchen lisinopril (PRINIVIL,ZESTRIL) 20 MG tablet Take 20 mg by mouth daily.   . metFORMIN (GLUCOPHAGE) 500 MG tablet Take 500 mg by mouth 2 (two) times daily with a meal.  . Multiple Vitamins-Minerals (CENTRUM SILVER ADULT 50+ PO) Take 1 tablet by mouth daily.  . Multiple Vitamins-Minerals (PRESERVISION AREDS 2) CAPS Take 1 capsule by mouth daily.   Marland Kitchen spironolactone (ALDACTONE) 25 MG tablet Take 0.5 tablets (12.5 mg total) by mouth daily.  Marland Kitchen Ustekinumab (STELARA) 45 MG/0.5ML SOLN Inject 45 mg into the skin every 3 (three) months.   . [DISCONTINUED] metoprolol succinate (TOPROL-XL) 50 MG 24 hr tablet Take 1 tablet (50 mg total) by mouth daily.     Allergies:   Patient has no known allergies.   Social History   Socioeconomic History  . Marital status: Single    Spouse name: Not on file  . Number of children: Not on file  . Years of education: Not on file  . Highest education level: Not on file  Occupational History  .  Not on file  Tobacco Use  . Smoking status: Former Research scientist (life sciences)  . Smokeless tobacco: Never Used  Substance and Sexual Activity  . Alcohol use: No  . Drug use: No  . Sexual activity: Not on file  Other Topics Concern  . Not on file  Social History Narrative  . Not on file   Social Determinants of Health   Financial Resource Strain:   . Difficulty of Paying Living Expenses: Not on file  Food Insecurity:   . Worried About Char[employer] fundraiser in the Last Year: Not on file  . Ran Out of Food in the Last Year: Not on file  Transportation Needs:   . Lack of Transportation (Medical): Not on file  .  Lack of Transportation (Non-Medical): Not on file  Physical Activity:   . Days of Exercise per Week: Not on file  . Minutes of Exercise per Session: Not on file  Stress:   . Feeling of Stress : Not on file  Social Connections:   . Frequency of Communication with Friends and Family: Not on file  . Frequency of Social Gatherings with Friends and Family: Not on file  . Attends Religious Services: Not on file  . Active Member of Clubs or Organizations: Not on file  . Attends Archivist Meetings: Not on file  . Marital Status: Not on file     Family History: The patient's family history includes Heart disease in her mother.  ROS:   Please see the history of present illness.    All other systems reviewed and are negative.  EKGs/Labs/Other Studies Reviewed:    The following studies were reviewed today: Echo, ECG  04/01/2020 Echo personally reviewed EF moderately to severely reduced, 25%   EKG:  The ekg ordered today demonstrates atrial fibrillation with RVR.  Recent Labs: 04/01/2020: Magnesium 2.0; TSH 1.866 04/02/2020: Hemoglobin 13.9; Platelets 214 04/05/2020: B Natriuretic Peptide 57.9 04/10/2020: BUN 18; Creatinine, Ser 1.11; Potassium 4.4; Sodium 141  Recent Lipid Panel    Component Value Date/Time   CHOL 200 04/02/2020 0231   TRIG 104 04/02/2020 0231   HDL 37 (L) 04/02/2020 0231   CHOLHDL 5.4 04/02/2020 0231   VLDL 21 04/02/2020 0231   LDLCALC 142 (H) 04/02/2020 0231    Physical Exam:    VS:  BP 110/70   Pulse (!) 150   Ht 5\' 11"  (1.803 m)   Wt 221 lb (100.2 kg)   SpO2 96%   BMI 30.82 kg/m     Wt Readings from Last 3 Encounters:  04/29/20 221 lb (100.2 kg)  04/22/20 221 lb 12.8 oz (100.6 kg)  04/10/20 222 lb 9.6 oz (101 kg)     GEN: Well nourished, well developed in no acute distress. Obese, HEENT: Normal NECK: No JVD; No carotid bruits LYMPHATICS: No lymphadenopathy CARDIAC: irregularly irregular and tachycardic, no murmurs, rubs,  gallops RESPIRATORY:  Clear to auscultation without rales, wheezing or rhonchi  ABDOMEN: Soft, non-tender, non-distended MUSCULOSKELETAL:  No edema; No deformity  SKIN: Warm and dry NEUROLOGIC:  Alert and oriented x 3 PSYCHIATRIC:  Blunted affect   ASSESSMENT:    1. Persistent atrial fibrillation ([place])   2. Chronic systolic heart failure ([place])   3. Essential hypertension    PLAN:    In order of problems listed above:  1. Persistent Atrial Fibrillation I have discussed management options for AF including continued antiarrhythmic therapy, AF ablation and AVJ ablation with biV pacemaker implant including the risks, benefits of each.  I am concerned that her AF may be approaching permanent given the prolonged duration of her atrial arrhythmias and nearly immediate failure of the most recent cardioversion. Since then, she has been loaded on amiodarone but continues to be in AF with RVR. I would like to attempt DCCV one more time after amiodarone loading. If she fails cardioversion again, will consider her to have permanent atrial fibrillation and pursue CRT implant with an AVJ ablation.   Currently on apixaban for stroke ppx given a CHADSVASc of 4. She will continue this.  I will increase her metoprolol to 100mg  daily today in an effort to improve rate control and her symptoms.  Risks, benefits, alternatives to DCCV were discussed and she wishes to proceed.   2. Chronic systolic heart failure EF 20-25%. NYHA II-III symptoms. Ischemic evaluation pending. I do think that her AF with RVR is contributing to her decreased LV function based on the appearance of her echocardiogram and symptoms.  She has a stress test scheduled for 05/03/2020 but this will have to be canceled given her uncontrolled ventricular rates. Will need to reschedule this test after sinus rhythm has been restored.   3. HTN Well controlled today. Continue current therapy.  Medication Adjustments/Labs and Tests  Ordered: Current medicines are reviewed at length with the patient today.  Concerns regarding medicines are outlined above.  Orders Placed This Encounter  Procedures  . EKG 12-Lead   Meds ordered this encounter  Medications  . metoprolol succinate (TOPROL-XL) 100 MG 24 hr tablet    Sig: Take 1 tablet (100 mg total) by mouth daily. Take with or immediately following a meal.    Dispense:  90 tablet    Refill:  3     Signed, Lars Mage, MD, [facility]  04/29/2020 12:47 PM    Electrophysiology [place]

## 2020-04-28 NOTE — Telephone Encounter (Signed)
Encounter complete. 

## 2020-04-28 NOTE — Progress Notes (Signed)
Electrophysiology Office Note:    Date:  04/29/2020   ID:  Melissa Bishop, DOB 1946/04/07, MRN [id number]  PCP:  Melissa Bishop, [facility] Cardiologist:  Melissa Field, MD  [facility] Electrophysiologist:  Melissa Epley, MD   Referring MD: Melissa Bishop, *   Chief Complaint: AF  History of Present Illness:    Melissa Bishop is a 74 y.o. female who presents for an evaluation of atrial fibrillation at the request of Dr Melissa Bishop. Their medical history includes HTN, DM, HF and AF on amiodarone. She did have a recent admission for AF w/ RVR. She initially was managed with TEE/DCCV but had ERAF so was started on amiodarone. At her last clinic appointment on 9/10 she was back in atrial fibrillation.   She has had atrial fibrillation dating back to at least 2018. She cannot reliably tell when she is in atrial fibrillation although she thinks she was in afib for a prolonged amount of time leading to the most recent cardioversion. She has no problem with the eliquis or amiodarone. She tells me that she feels weak and tires easily which she attributes to the decreased EF. She knows "something has to be done".  Her mother who is with her today in clinic has atrial fibrillation and a pacemaker.  Past Medical History:  Diagnosis Date  . CHF (congestive heart failure) ([place])   . Coronary artery disease   . Diabetes mellitus without complication ([place])   . High cholesterol   . Hypertension   . Psoriasis     Past Surgical History:  Procedure Laterality Date  . BREAST EXCISIONAL BIOPSY Right ? more than 30 years  . BREAST SURGERY    . CARDIOVERSION N/A 04/04/2020   Procedure: CARDIOVERSION;  Surgeon: Jerline Pain, MD;  Location: [facility] ENDOSCOPY;  Service: Cardiovascular;  Laterality: N/A;  . TEE WITHOUT CARDIOVERSION N/A 04/04/2020   Procedure: TRANSESOPHAGEAL ECHOCARDIOGRAM (TEE);  Surgeon: Jerline Pain, MD;  Location: [facility] ENDOSCOPY;  Service: Cardiovascular;  Laterality: N/A;     Current Medications: Current Meds  Medication Sig  . amiodarone (PACERONE) 200 MG tablet Take 1 tablet (200 mg total) by mouth daily.  Marland Kitchen apixaban (ELIQUIS) 5 MG TABS tablet Take 1 tablet (5 mg total) by mouth 2 (two) times daily.  Marland Kitchen atorvastatin (LIPITOR) 80 MG tablet Take 80 mg by mouth daily.  . furosemide (LASIX) 40 MG tablet Take 1 tablet (40 mg total) by mouth daily.  Marland Kitchen lisinopril (PRINIVIL,ZESTRIL) 20 MG tablet Take 20 mg by mouth daily.   . metFORMIN (GLUCOPHAGE) 500 MG tablet Take 500 mg by mouth 2 (two) times daily with a meal.  . Multiple Vitamins-Minerals (CENTRUM SILVER ADULT 50+ PO) Take 1 tablet by mouth daily.  . Multiple Vitamins-Minerals (PRESERVISION AREDS 2) CAPS Take 1 capsule by mouth daily.   Marland Kitchen spironolactone (ALDACTONE) 25 MG tablet Take 0.5 tablets (12.5 mg total) by mouth daily.  Marland Kitchen Ustekinumab (STELARA) 45 MG/0.5ML SOLN Inject 45 mg into the skin every 3 (three) months.   . [DISCONTINUED] metoprolol succinate (TOPROL-XL) 50 MG 24 hr tablet Take 1 tablet (50 mg total) by mouth daily.     Allergies:   Patient has no known allergies.   Social History   Socioeconomic History  . Marital status: Single    Spouse name: Not on file  . Number of children: Not on file  . Years of education: Not on file  . Highest education level: Not on file  Occupational History  .  Not on file  Tobacco Use  . Smoking status: Former Research scientist (life sciences)  . Smokeless tobacco: Never Used  Substance and Sexual Activity  . Alcohol use: No  . Drug use: No  . Sexual activity: Not on file  Other Topics Concern  . Not on file  Social History Narrative  . Not on file   Social Determinants of Health   Financial Resource Strain:   . Difficulty of Paying Living Expenses: Not on file  Food Insecurity:   . Worried About Charity fundraiser in the Last Year: Not on file  . Ran Out of Food in the Last Year: Not on file  Transportation Needs:   . Lack of Transportation (Medical): Not on file  .  Lack of Transportation (Non-Medical): Not on file  Physical Activity:   . Days of Exercise per Week: Not on file  . Minutes of Exercise per Session: Not on file  Stress:   . Feeling of Stress : Not on file  Social Connections:   . Frequency of Communication with Friends and Family: Not on file  . Frequency of Social Gatherings with Friends and Family: Not on file  . Attends Religious Services: Not on file  . Active Member of Clubs or Organizations: Not on file  . Attends Archivist Meetings: Not on file  . Marital Status: Not on file     Family History: The patient's family history includes Heart disease in her mother.  ROS:   Please see the history of present illness.    All other systems reviewed and are negative.  EKGs/Labs/Other Studies Reviewed:    The following studies were reviewed today: Echo, ECG  04/01/2020 Echo personally reviewed EF moderately to severely reduced, 25%   EKG:  The ekg ordered today demonstrates atrial fibrillation with RVR.  Recent Labs: 04/01/2020: Magnesium 2.0; TSH 1.866 04/02/2020: Hemoglobin 13.9; Platelets 214 04/05/2020: B Natriuretic Peptide 57.9 04/10/2020: BUN 18; Creatinine, Ser 1.11; Potassium 4.4; Sodium 141  Recent Lipid Panel    Component Value Date/Time   CHOL 200 04/02/2020 0231   TRIG 104 04/02/2020 0231   HDL 37 (L) 04/02/2020 0231   CHOLHDL 5.4 04/02/2020 0231   VLDL 21 04/02/2020 0231   LDLCALC 142 (H) 04/02/2020 0231    Physical Exam:    VS:  BP 110/70   Pulse (!) 150   Ht 5\' 11"  (1.803 m)   Wt 221 lb (100.2 kg)   SpO2 96%   BMI 30.82 kg/m     Wt Readings from Last 3 Encounters:  04/29/20 221 lb (100.2 kg)  04/22/20 221 lb 12.8 oz (100.6 kg)  04/10/20 222 lb 9.6 oz (101 kg)     GEN: Well nourished, well developed in no acute distress. Obese, HEENT: Normal NECK: No JVD; No carotid bruits LYMPHATICS: No lymphadenopathy CARDIAC: irregularly irregular and tachycardic, no murmurs, rubs,  gallops RESPIRATORY:  Clear to auscultation without rales, wheezing or rhonchi  ABDOMEN: Soft, non-tender, non-distended MUSCULOSKELETAL:  No edema; No deformity  SKIN: Warm and dry NEUROLOGIC:  Alert and oriented x 3 PSYCHIATRIC:  Blunted affect   ASSESSMENT:    1. Persistent atrial fibrillation ([place])   2. Chronic systolic heart failure ([place])   3. Essential hypertension    PLAN:    In order of problems listed above:  1. Persistent Atrial Fibrillation I have discussed management options for AF including continued antiarrhythmic therapy, AF ablation and AVJ ablation with biV pacemaker implant including the risks, benefits of each.  I am concerned that her AF may be approaching permanent given the prolonged duration of her atrial arrhythmias and nearly immediate failure of the most recent cardioversion. Since then, she has been loaded on amiodarone but continues to be in AF with RVR. I would like to attempt DCCV one more time after amiodarone loading. If she fails cardioversion again, will consider her to have permanent atrial fibrillation and pursue CRT implant with an AVJ ablation.   Currently on apixaban for stroke ppx given a CHADSVASc of 4. She will continue this.  I will increase her metoprolol to 100mg  daily today in an effort to improve rate control and her symptoms.  Risks, benefits, alternatives to DCCV were discussed and she wishes to proceed.   2. Chronic systolic heart failure EF 20-25%. NYHA II-III symptoms. Ischemic evaluation pending. I do think that her AF with RVR is contributing to her decreased LV function based on the appearance of her echocardiogram and symptoms.  She has a stress test scheduled for 05/03/2020 but this will have to be canceled given her uncontrolled ventricular rates. Will need to reschedule this test after sinus rhythm has been restored.   3. HTN Well controlled today. Continue current therapy.  Medication Adjustments/Labs and Tests  Ordered: Current medicines are reviewed at length with the patient today.  Concerns regarding medicines are outlined above.  Orders Placed This Encounter  Procedures  . EKG 12-Lead   Meds ordered this encounter  Medications  . metoprolol succinate (TOPROL-XL) 100 MG 24 hr tablet    Sig: Take 1 tablet (100 mg total) by mouth daily. Take with or immediately following a meal.    Dispense:  90 tablet    Refill:  3     Signed, Lars Mage, MD, [facility]  04/29/2020 12:47 PM    Electrophysiology [place]

## 2020-04-28 NOTE — Progress Notes (Signed)
Subjective: Melissa Bishop is a pleasant 74 y.o. female patient seen today preventative diabetic foot care and painful callus(es) b/l great toes and painful mycotic toenails b/l that are difficult to trim. Pain interferes with ambulation. Aggravating factors include wearing enclosed shoe gear. Pain is relieved with periodic professional debridement.   She states she spent 11 days in the hospital for CHF. She has not checked her blood sugar. Patient states she saw her PCP, Dr. [name], on last week.  Patient Active Problem List   Diagnosis Date Noted  . Acute systolic heart failure ([place])   . Atrial fibrillation with rapid ventricular response ([place]) 04/01/2020  . Atrial fibrillation with RVR ([place]) 04/01/2020  . Psoriasis 04/01/2020  . Constipation 04/01/2020  . Essential hypertension 04/01/2020  . Diabetes mellitus type II, non insulin dependent ([place]) 04/01/2020  . Dyspnea 04/01/2020    Current Outpatient Medications on File Prior to Visit  Medication Sig Dispense Refill  . amiodarone (PACERONE) 200 MG tablet Take 1 tablet (200 mg total) by mouth daily. 90 tablet 1  . apixaban (ELIQUIS) 5 MG TABS tablet Take 1 tablet (5 mg total) by mouth 2 (two) times daily. 90 tablet 1  . atorvastatin (LIPITOR) 80 MG tablet Take 80 mg by mouth daily.    . furosemide (LASIX) 40 MG tablet Take 1 tablet (40 mg total) by mouth daily. 90 tablet 1  . lisinopril (PRINIVIL,ZESTRIL) 20 MG tablet Take 20 mg by mouth daily.     . metFORMIN (GLUCOPHAGE) 500 MG tablet Take 500 mg by mouth 2 (two) times daily with a meal.    . metoprolol succinate (TOPROL-XL) 50 MG 24 hr tablet Take 1 tablet (50 mg total) by mouth daily. 90 tablet 1  . Multiple Vitamins-Minerals (CENTRUM SILVER ADULT 50+ PO) Take 1 tablet by mouth daily.    . Multiple Vitamins-Minerals (PRESERVISION AREDS 2) CAPS Take 1 capsule by mouth daily.     Marland Kitchen spironolactone (ALDACTONE) 25 MG tablet Take 0.5 tablets (12.5 mg total) by mouth daily. 30 tablet  3  . Ustekinumab (STELARA) 45 MG/0.5ML SOLN Inject 45 mg into the skin every 3 (three) months.      No current facility-administered medications on file prior to visit.    No Known Allergies  Objective: Physical Exam  General: Melissa Bishop is a pleasant 74 y.o.  Caucasian female, in NAD. AAO x 3.   Vascular:  Neurovascular status unchanged b/l lower extremities. Capillary fill time to digits <3 seconds b/l lower extremities. Palpable DP pulses b/l. Palpable PT pulses b/l. Pedal hair sparse b/l. Skin temperature gradient within normal limits b/l.  Dermatological:  Pedal skin with normal turgor, texture and tone bilaterally. No open wounds bilaterally. No interdigital macerations bilaterally. Toenails 1-5 b/l elongated, discolored, dystrophic, thickened, crumbly with subungual debris and tenderness to dorsal palpation. Hyperkeratotic lesion(s) L hallux and R hallux.  No erythema, no edema, no drainage, no flocculence.  Musculoskeletal:  Normal muscle strength 5/5 to all lower extremity muscle groups bilaterally. No pain crepitus or joint limitation noted with ROM b/l. No gross bony deformities bilaterally.  Neurological:  Protective sensation intact 5/5 intact bilaterally with 10g monofilament b/l. Vibratory sensation intact b/l.  Assessment and Plan:  1. Pain due to onychomycosis of toenails of both feet   2. Callus   3. Controlled type 2 diabetes mellitus without complication, without long-term current use of insulin ([place])   -Examined patient. -Continue diabetic foot care principles. -Toenails 1-5 b/l were debrided in length and girth  with sterile nail nippers and dremel without iatrogenic bleeding.  -Callus(es) L hallux and R hallux pared utilizing sterile scalpel blade without complication or incident. Total number debrided =2. -Patient to continue soft, supportive shoe gear daily. -Patient to report any pedal injuries to medical professional immediately. -Patient/POA to call  should there be question/concern in the interim.  Return in about 3 months (around 07/26/2020).  Marzetta Board, DPM

## 2020-04-29 ENCOUNTER — Other Ambulatory Visit: Payer: Self-pay

## 2020-04-29 ENCOUNTER — Encounter: Payer: Self-pay | Admitting: Cardiology

## 2020-04-29 ENCOUNTER — Ambulatory Visit (INDEPENDENT_AMBULATORY_CARE_PROVIDER_SITE_OTHER): Payer: Medicare Other | Admitting: Cardiology

## 2020-04-29 VITALS — BP 110/70 | HR 150 | Ht 71.0 in | Wt 221.0 lb

## 2020-04-29 DIAGNOSIS — I4819 Other persistent atrial fibrillation: Secondary | ICD-10-CM | POA: Diagnosis not present

## 2020-04-29 DIAGNOSIS — E119 Type 2 diabetes mellitus without complications: Secondary | ICD-10-CM | POA: Diagnosis not present

## 2020-04-29 DIAGNOSIS — I5023 Acute on chronic systolic (congestive) heart failure: Secondary | ICD-10-CM | POA: Diagnosis not present

## 2020-04-29 DIAGNOSIS — I4891 Unspecified atrial fibrillation: Secondary | ICD-10-CM | POA: Diagnosis not present

## 2020-04-29 DIAGNOSIS — K5900 Constipation, unspecified: Secondary | ICD-10-CM | POA: Diagnosis not present

## 2020-04-29 DIAGNOSIS — I10 Essential (primary) hypertension: Secondary | ICD-10-CM

## 2020-04-29 DIAGNOSIS — I270 Primary pulmonary hypertension: Secondary | ICD-10-CM | POA: Diagnosis not present

## 2020-04-29 DIAGNOSIS — I5022 Chronic systolic (congestive) heart failure: Secondary | ICD-10-CM

## 2020-04-29 DIAGNOSIS — I110 Hypertensive heart disease with heart failure: Secondary | ICD-10-CM | POA: Diagnosis not present

## 2020-04-29 MED ORDER — METOPROLOL SUCCINATE ER 100 MG PO TB24: 100.0000 mg | ORAL_TABLET | Freq: Every day | ORAL | 3 refills | Status: DC

## 2020-04-29 NOTE — Patient Instructions (Addendum)
Medication Instructions:  Your physician has recommended you make the following change in your medication:   1.  INCREASE your metoprolol succinate (Toprol XL) 50 mg-  TAKE TWO tablets (100 mg) by mouth once a day.  Lab Work: None ordered. If you have labs (blood work) drawn today and your tests are completely normal, you will receive your results only by: Marland Kitchen MyChart Message (if you have MyChart) OR . A paper copy in the mail If you have any lab test that is abnormal or we need to change your treatment, we will call you to review the results.  Testing/Procedures: Your physician has recommended that you have a Cardioversion (DCCV). Electrical Cardioversion uses a jolt of electricity to your heart either through paddles or wired patches attached to your chest. This is a controlled, usually prescheduled, procedure. Defibrillation is done under light anesthesia in the hospital, and you usually go home the day of the procedure. This is done to get your heart back into a normal rhythm. You are not awake for the procedure. Please see the instruction sheet given to you today.  Follow-Up: Your next appointment:   Your physician wants you to follow-up in: 3 weeks with Dr. Quentin Ore.     May 17, 2020 at 11:30 am at the [facility] office.   CARDIOVERSION INSTRUCTIONS:  Covid test:  September 24 at 12:00 pm -- This is a Drive Up Visit at [address] [place], [place] [address] Someone will direct you to the appropriate testing line. Stay in your car and someone will be with you shortly.  After your covid test go home and quarantine until the day of your procedure.    You are scheduled for a Cardioversion on May 10, 2020 with Dr. Oval Linsey.   Please arrive at the [facility] (Main Entrance A) at [facility]: [address] [place], [place] [address] at 9:30 am.   DIET: Nothing to eat or drink after midnight except a sip of water with medications (see medication instructions  below)  Medication Instructions: TAKE all your morning medications with a sip of water EXCEPT Lasix, spironolactone (fluid pills) and metformin (sugar pill)  You will need to continue your anticoagulant after your procedure until you  are told by your  Provider that it is safe to stop  You must have a responsible person to drive you home and stay in the waiting area during your procedure. Failure to do so could result in cancellation.  Bring your insurance cards.  *Special Note: Every effort is made to have your procedure done on time. Occasionally there are emergencies that occur at the hospital that may cause delays. Please be patient if a delay does occur.

## 2020-05-03 ENCOUNTER — Ambulatory Visit (HOSPITAL_COMMUNITY)
Admission: RE | Admit: 2020-05-03 | Payer: Medicare Other | Source: Ambulatory Visit | Attending: Cardiovascular Disease | Admitting: Cardiovascular Disease

## 2020-05-03 DIAGNOSIS — E119 Type 2 diabetes mellitus without complications: Secondary | ICD-10-CM | POA: Diagnosis not present

## 2020-05-03 DIAGNOSIS — K5900 Constipation, unspecified: Secondary | ICD-10-CM | POA: Diagnosis not present

## 2020-05-03 DIAGNOSIS — I5023 Acute on chronic systolic (congestive) heart failure: Secondary | ICD-10-CM | POA: Diagnosis not present

## 2020-05-03 DIAGNOSIS — I110 Hypertensive heart disease with heart failure: Secondary | ICD-10-CM | POA: Diagnosis not present

## 2020-05-03 DIAGNOSIS — I270 Primary pulmonary hypertension: Secondary | ICD-10-CM | POA: Diagnosis not present

## 2020-05-03 DIAGNOSIS — I4891 Unspecified atrial fibrillation: Secondary | ICD-10-CM | POA: Diagnosis not present

## 2020-05-06 ENCOUNTER — Other Ambulatory Visit (HOSPITAL_COMMUNITY)
Admission: RE | Admit: 2020-05-06 | Discharge: 2020-05-06 | Disposition: A | Discharge status: Home / Self Care | Payer: Medicare Other | Source: Ambulatory Visit | Attending: Cardiovascular Disease | Admitting: Cardiovascular Disease

## 2020-05-06 DIAGNOSIS — Z01812 Encounter for preprocedural laboratory examination: Secondary | ICD-10-CM | POA: Diagnosis not present

## 2020-05-06 DIAGNOSIS — Z20822 Contact with and (suspected) exposure to covid-19: Secondary | ICD-10-CM | POA: Diagnosis not present

## 2020-05-06 LAB — SARS CORONAVIRUS 2 (TAT 6-24 HRS): SARS Coronavirus 2: NEGATIVE

## 2020-05-10 ENCOUNTER — Ambulatory Visit (HOSPITAL_COMMUNITY): Payer: Medicare Other | Admitting: Anesthesiology

## 2020-05-10 ENCOUNTER — Encounter (HOSPITAL_COMMUNITY)
Admission: RE | Disposition: A | Discharge status: Home / Self Care | Payer: Self-pay | Source: Home / Self Care | Attending: Cardiovascular Disease

## 2020-05-10 ENCOUNTER — Ambulatory Visit (HOSPITAL_COMMUNITY)
Admission: RE | Admit: 2020-05-10 | Discharge: 2020-05-10 | Disposition: A | Discharge status: Home / Self Care | Payer: Medicare Other | Attending: Cardiovascular Disease | Admitting: Cardiovascular Disease

## 2020-05-10 ENCOUNTER — Other Ambulatory Visit: Payer: Self-pay

## 2020-05-10 ENCOUNTER — Encounter (HOSPITAL_COMMUNITY): Payer: Self-pay | Admitting: Cardiovascular Disease

## 2020-05-10 DIAGNOSIS — E119 Type 2 diabetes mellitus without complications: Secondary | ICD-10-CM | POA: Diagnosis not present

## 2020-05-10 DIAGNOSIS — I5022 Chronic systolic (congestive) heart failure: Secondary | ICD-10-CM | POA: Insufficient documentation

## 2020-05-10 DIAGNOSIS — Z79899 Other long term (current) drug therapy: Secondary | ICD-10-CM | POA: Insufficient documentation

## 2020-05-10 DIAGNOSIS — Z7901 Long term (current) use of anticoagulants: Secondary | ICD-10-CM | POA: Insufficient documentation

## 2020-05-10 DIAGNOSIS — I110 Hypertensive heart disease with heart failure: Secondary | ICD-10-CM | POA: Insufficient documentation

## 2020-05-10 DIAGNOSIS — I2510 Atherosclerotic heart disease of native coronary artery without angina pectoris: Secondary | ICD-10-CM | POA: Diagnosis not present

## 2020-05-10 DIAGNOSIS — I5021 Acute systolic (congestive) heart failure: Secondary | ICD-10-CM | POA: Diagnosis not present

## 2020-05-10 DIAGNOSIS — Z7984 Long term (current) use of oral hypoglycemic drugs: Secondary | ICD-10-CM | POA: Insufficient documentation

## 2020-05-10 DIAGNOSIS — E7800 Pure hypercholesterolemia, unspecified: Secondary | ICD-10-CM | POA: Insufficient documentation

## 2020-05-10 DIAGNOSIS — Z87891 Personal history of nicotine dependence: Secondary | ICD-10-CM | POA: Insufficient documentation

## 2020-05-10 DIAGNOSIS — I4819 Other persistent atrial fibrillation: Secondary | ICD-10-CM

## 2020-05-10 HISTORY — PX: CARDIOVERSION: SHX1299

## 2020-05-10 LAB — POCT I-STAT, CHEM 8
BUN: 28 mg/dL — ABNORMAL HIGH (ref 8–23)
Calcium, Ion: 1.15 mmol/L (ref 1.15–1.40)
Chloride: 109 mmol/L (ref 98–111)
Creatinine, Ser: 0.8 mg/dL (ref 0.44–1.00)
Glucose, Bld: 163 mg/dL — ABNORMAL HIGH (ref 70–99)
HCT: 40 % (ref 36.0–46.0)
Hemoglobin: 13.6 g/dL (ref 12.0–15.0)
Potassium: 4 mmol/L (ref 3.5–5.1)
Sodium: 143 mmol/L (ref 135–145)
TCO2: 23 mmol/L (ref 22–32)

## 2020-05-10 SURGERY — CARDIOVERSION
Anesthesia: General

## 2020-05-10 MED ORDER — LIDOCAINE 2% (20 MG/ML) 5 ML SYRINGE
INTRAMUSCULAR | Status: DC | PRN
  Administered 2020-05-10: 60 mg via INTRAVENOUS

## 2020-05-10 MED ORDER — SODIUM CHLORIDE 0.9 % IV SOLN: INTRAVENOUS | Status: DC | PRN

## 2020-05-10 MED ORDER — PROPOFOL 10 MG/ML IV BOLUS
INTRAVENOUS | Status: DC | PRN
  Administered 2020-05-10: 60 mg via INTRAVENOUS

## 2020-05-10 NOTE — Anesthesia Procedure Notes (Signed)
Procedure Name: General with mask airway Date/Time: 05/10/2020 11:14 AM Performed by: Bryson Corona, CRNA Pre-anesthesia Checklist: Patient identified, Emergency Drugs available, Suction available, Patient being monitored and Timeout performed Patient Re-evaluated:Patient Re-evaluated prior to induction Oxygen Delivery Method: Circle system utilized Preoxygenation: Pre-oxygenation with 100% oxygen Induction Type: IV induction Placement Confirmation: positive ETCO2 Dental Injury: Teeth and Oropharynx as per pre-operative assessment

## 2020-05-10 NOTE — Transfer of Care (Signed)
Immediate Anesthesia Transfer of Care Note  Patient: Melissa Bishop  Procedure(s) Performed: CARDIOVERSION (N/A )  Patient Location: Endoscopy Unit  Anesthesia Type:General  Level of Consciousness: drowsy  Airway & Oxygen Therapy: Patient Spontanous Breathing and Patient connected to nasal cannula oxygen  Post-op Assessment: Report given to RN, Post -op Vital signs reviewed and stable and Patient moving all extremities  Post vital signs: Reviewed and stable  Last Vitals:  Vitals Value Taken Time  BP    Temp    Pulse    Resp    SpO2      Last Pain:  Vitals:   05/10/20 1027  TempSrc:   PainSc: 0-No pain         Complications: No complications documented.

## 2020-05-10 NOTE — Discharge Instructions (Signed)

## 2020-05-10 NOTE — CV Procedure (Signed)
Electrical Cardioversion Procedure Note Melissa Bishop [id number] Sep 25, 1945  Procedure: Electrical Cardioversion Indications:  Atrial Fibrillation  Procedure Details Consent: Risks of procedure as well as the alternatives and risks of each were explained to the (patient/caregiver).  Consent for procedure obtained. Time Out: Verified patient identification, verified procedure, site/side was marked, verified correct patient position, special equipment/implants available, medications/allergies/relevent history reviewed, required imaging and test results available.  Performed  Patient placed on cardiac monitor, pulse oximetry, supplemental oxygen as necessary.  Sedation given: propo Pacer pads placed anterior and posterior chest.  Cardioverted 1 time(s).  Cardioverted at 150J.  Evaluation Findings: Post procedure EKG shows: NSR Complications: None Patient did tolerate procedure well.   Melissa Latch, MD 05/10/2020, 11:16 AM

## 2020-05-10 NOTE — Anesthesia Preprocedure Evaluation (Addendum)
Anesthesia Evaluation  Patient identified by MRN, date of birth, ID band  Reviewed: Allergy & Precautions, NPO status , Patient's Chart, lab work & pertinent test results  Airway Mallampati: II  TM Distance: >3 FB Neck ROM: Limited    Dental  (+) Missing   Pulmonary former smoker,    breath sounds clear to auscultation       Cardiovascular Exercise Tolerance: Good hypertension, + CAD and +CHF  + dysrhythmias Atrial Fibrillation  Rhythm:Regular Rate:Tachycardia  ECHO 8/21 Left ventricular ejection fraction, by estimation, is 20 to 25%. The left ventricle has severely decreased function. The left ventricle has no regional wall motion abnormalities. The left ventricular internal cavity size was normal in size. There is no left ventricular hypertrophy.   Neuro/Psych negative neurological ROS  negative psych ROS   GI/Hepatic negative GI ROS, Neg liver ROS,   Endo/Other  diabetes, Type 2, Oral Hypoglycemic AgentsMorbid obesity  Renal/GU negative Renal ROS  negative genitourinary   Musculoskeletal negative musculoskeletal ROS (+)   Abdominal (+) + obese,   Peds  Hematology   Anesthesia Other Findings   Reproductive/Obstetrics                            Anesthesia Physical  Anesthesia Plan  ASA: IV  Anesthesia Plan: General   Post-op Pain Management:    Induction:   PONV Risk Score and Plan: 2  Airway Management Planned: Natural Airway, Simple Face Mask, Nasal Cannula and Mask  Additional Equipment:   Intra-op Plan:   Post-operative Plan:   Informed Consent: I have reviewed the patients History and Physical, chart, labs and discussed the procedure including the risks, benefits and alternatives for the proposed anesthesia with the patient or authorized representative who has indicated his/her understanding and acceptance.       Plan Discussed with: Anesthesiologist  Anesthesia  Plan Comments:        Anesthesia Quick Evaluation

## 2020-05-10 NOTE — Interval H&P Note (Signed)
History and Physical Interval Note:  05/10/2020 10:54 AM  Melissa Bishop  has presented today for surgery, with the diagnosis of [place].  The various methods of treatment have been discussed with the patient and family. After consideration of risks, benefits and other options for treatment, the patient has consented to  Procedure(s): CARDIOVERSION (N/A) as a surgical intervention.  The patient's history has been reviewed, patient examined, no change in status, stable for surgery.  I have reviewed the patient's chart and labs.  Questions were answered to the patient's satisfaction.     [name], MD

## 2020-05-11 ENCOUNTER — Encounter (HOSPITAL_COMMUNITY): Payer: Self-pay | Admitting: Cardiovascular Disease

## 2020-05-11 DIAGNOSIS — I5023 Acute on chronic systolic (congestive) heart failure: Secondary | ICD-10-CM | POA: Diagnosis not present

## 2020-05-11 DIAGNOSIS — Z7901 Long term (current) use of anticoagulants: Secondary | ICD-10-CM | POA: Diagnosis not present

## 2020-05-11 DIAGNOSIS — I110 Hypertensive heart disease with heart failure: Secondary | ICD-10-CM | POA: Diagnosis not present

## 2020-05-11 DIAGNOSIS — L409 Psoriasis, unspecified: Secondary | ICD-10-CM | POA: Diagnosis not present

## 2020-05-11 DIAGNOSIS — K5900 Constipation, unspecified: Secondary | ICD-10-CM | POA: Diagnosis not present

## 2020-05-11 DIAGNOSIS — I4891 Unspecified atrial fibrillation: Secondary | ICD-10-CM | POA: Diagnosis not present

## 2020-05-11 DIAGNOSIS — Z7984 Long term (current) use of oral hypoglycemic drugs: Secondary | ICD-10-CM | POA: Diagnosis not present

## 2020-05-11 DIAGNOSIS — I270 Primary pulmonary hypertension: Secondary | ICD-10-CM | POA: Diagnosis not present

## 2020-05-11 DIAGNOSIS — E7800 Pure hypercholesterolemia, unspecified: Secondary | ICD-10-CM | POA: Diagnosis not present

## 2020-05-11 DIAGNOSIS — E119 Type 2 diabetes mellitus without complications: Secondary | ICD-10-CM | POA: Diagnosis not present

## 2020-05-11 NOTE — Anesthesia Postprocedure Evaluation (Signed)
Anesthesia Post Note  Patient: Melissa Bishop  Procedure(s) Performed: CARDIOVERSION (N/A )     Patient location during evaluation: PACU Anesthesia Type: General Level of consciousness: awake and alert Pain management: pain level controlled Vital Signs Assessment: post-procedure vital signs reviewed and stable Respiratory status: spontaneous breathing, nonlabored ventilation, respiratory function stable and patient connected to nasal cannula oxygen Cardiovascular status: blood pressure returned to baseline and stable Postop Assessment: no apparent nausea or vomiting Anesthetic complications: no   No complications documented.  Last Vitals:  Vitals:   05/10/20 1130 05/10/20 1140  BP: (!) 102/55 106/60  Pulse: 73 62  Resp: 13 16  Temp:    SpO2: 97% 100%    Last Pain:  Vitals:   05/11/20 1349  TempSrc:   PainSc: 0-No pain                 Emmory Solivan

## 2020-05-12 ENCOUNTER — Telehealth: Payer: Self-pay | Admitting: Cardiovascular Disease

## 2020-05-12 MED ORDER — FUROSEMIDE 40 MG PO TABS
40.0000 mg | ORAL_TABLET | Freq: Every day | ORAL | 1 refills | Status: DC
Start: 2020-05-12 — End: 2021-02-17

## 2020-05-12 NOTE — Telephone Encounter (Signed)
New Message:    Pt wants to know if she is supposed to be still taking Furosemide? If so, she will need it called in.    *STAT* If patient is at the pharmacy, call can be transferred to refill team.   1. Which medications need to be refilled? (please list name of each medication and dose if known)  A new prescription for Furosemide- please call this today if pt is supposed to be taking it  2. Which pharmacy/location (including street and city if local pharmacy) is medication to be sent to? [facility] [address], [place]  3. Do they need a 30 day or 90 day supply? 30 days and refills

## 2020-05-12 NOTE — Telephone Encounter (Signed)
Refill sent to the pharmacy electronically.  

## 2020-05-13 DIAGNOSIS — I270 Primary pulmonary hypertension: Secondary | ICD-10-CM | POA: Diagnosis not present

## 2020-05-13 DIAGNOSIS — I4891 Unspecified atrial fibrillation: Secondary | ICD-10-CM | POA: Diagnosis not present

## 2020-05-13 DIAGNOSIS — K5900 Constipation, unspecified: Secondary | ICD-10-CM | POA: Diagnosis not present

## 2020-05-13 DIAGNOSIS — E119 Type 2 diabetes mellitus without complications: Secondary | ICD-10-CM | POA: Diagnosis not present

## 2020-05-13 DIAGNOSIS — I110 Hypertensive heart disease with heart failure: Secondary | ICD-10-CM | POA: Diagnosis not present

## 2020-05-13 DIAGNOSIS — I5023 Acute on chronic systolic (congestive) heart failure: Secondary | ICD-10-CM | POA: Diagnosis not present

## 2020-05-17 ENCOUNTER — Ambulatory Visit (INDEPENDENT_AMBULATORY_CARE_PROVIDER_SITE_OTHER): Payer: Medicare Other | Admitting: Cardiology

## 2020-05-17 ENCOUNTER — Other Ambulatory Visit: Payer: Self-pay

## 2020-05-17 ENCOUNTER — Encounter: Payer: Self-pay | Admitting: Cardiology

## 2020-05-17 VITALS — BP 110/74 | HR 63 | Ht 71.0 in | Wt 224.0 lb

## 2020-05-17 DIAGNOSIS — I5022 Chronic systolic (congestive) heart failure: Secondary | ICD-10-CM | POA: Diagnosis not present

## 2020-05-17 DIAGNOSIS — I4821 Permanent atrial fibrillation: Secondary | ICD-10-CM

## 2020-05-17 DIAGNOSIS — I10 Essential (primary) hypertension: Secondary | ICD-10-CM | POA: Diagnosis not present

## 2020-05-17 NOTE — Progress Notes (Signed)
Electrophysiology Office Follow up Visit Note:    Date:  05/17/2020   ID:  Melissa Bishop, DOB 10-24-1945, MRN [id number]  PCP:  [name], [facility] Cardiologist:  Evalina Field, MD  [facility] Electrophysiologist:  Vickie Epley, MD    Interval History:    Melissa Bishop is a 74 y.o. female who presents for a follow up visit. They were last seen in clinic 04/29/2020. Since their last appointment, she was loaded on amiodarone and underwent a cardioversion on 05/10/2020.  The cardioversion was only transiently successful and she has since returned to atrial fibrillation with a rapid ventricular response.  She continues to have dyspnea on exertion and lower extremity edema.  She is here today with her mother and her close friend who are very closely involved in her care.    Past Medical History:  Diagnosis Date  . CHF (congestive heart failure) ([place])   . Coronary artery disease   . Diabetes mellitus without complication ([place])   . High cholesterol   . Hypertension   . Psoriasis     Past Surgical History:  Procedure Laterality Date  . BREAST EXCISIONAL BIOPSY Right ? more than 30 years  . BREAST SURGERY    . CARDIOVERSION N/A 04/04/2020   Procedure: CARDIOVERSION;  Surgeon: Jerline Pain, MD;  Location: [facility] ENDOSCOPY;  Service: Cardiovascular;  Laterality: N/A;  . CARDIOVERSION N/A 05/10/2020   Procedure: CARDIOVERSION;  Surgeon: [name], MD;  Location: [place];  Service: Cardiovascular;  Laterality: N/A;  . TEE WITHOUT CARDIOVERSION N/A 04/04/2020   Procedure: TRANSESOPHAGEAL ECHOCARDIOGRAM (TEE);  Surgeon: Jerline Pain, MD;  Location: [facility] ENDOSCOPY;  Service: Cardiovascular;  Laterality: N/A;    Current Medications: Current Meds  Medication Sig  . apixaban (ELIQUIS) 5 MG TABS tablet Take 1 tablet (5 mg total) by mouth 2 (two) times daily.  Marland Kitchen atorvastatin (LIPITOR) 80 MG tablet Take 80 mg by mouth at bedtime.   . furosemide (LASIX) 40 MG  tablet Take 1 tablet (40 mg total) by mouth daily.  Marland Kitchen lisinopril (PRINIVIL,ZESTRIL) 20 MG tablet Take 20 mg by mouth at bedtime.   . metFORMIN (GLUCOPHAGE) 500 MG tablet Take 500 mg by mouth 2 (two) times daily with a meal.  . metoprolol succinate (TOPROL-XL) 100 MG 24 hr tablet Take 1 tablet (100 mg total) by mouth daily. Take with or immediately following a meal.  . Multiple Vitamin (MULTIVITAMIN WITH MINERALS) TABS tablet Take 1 tablet by mouth daily.  . Multiple Vitamins-Minerals (PRESERVISION AREDS 2) CAPS Take 1 capsule by mouth daily.   Marland Kitchen spironolactone (ALDACTONE) 25 MG tablet Take 0.5 tablets (12.5 mg total) by mouth daily.  Marland Kitchen Ustekinumab (STELARA) 45 MG/0.5ML SOLN Inject 45 mg into the skin every 3 (three) months.   . [DISCONTINUED] amiodarone (PACERONE) 200 MG tablet Take 1 tablet (200 mg total) by mouth daily. (Patient taking differently: Take 200 mg by mouth at bedtime. )     Allergies:   Patient has no known allergies.   Social History   Socioeconomic History  . Marital status: Single    Spouse name: Not on file  . Number of children: Not on file  . Years of education: Not on file  . Highest education level: Not on file  Occupational History  . Not on file  Tobacco Use  . Smoking status: Former Research scientist (life sciences)  . Smokeless tobacco: Never Used  Vaping Use  . Vaping Use: Never used  Substance and Sexual Activity  .  Alcohol use: No  . Drug use: No  . Sexual activity: Not on file  Other Topics Concern  . Not on file  Social History Narrative  . Not on file   Social Determinants of Health   Financial Resource Strain:   . Difficulty of Paying Living Expenses: Not on file  Food Insecurity:   . Worried About Charity fundraiser in the Last Year: Not on file  . Ran Out of Food in the Last Year: Not on file  Transportation Needs:   . Lack of Transportation (Medical): Not on file  . Lack of Transportation (Non-Medical): Not on file  Physical Activity:   . Days of Exercise  per Week: Not on file  . Minutes of Exercise per Session: Not on file  Stress:   . Feeling of Stress : Not on file  Social Connections:   . Frequency of Communication with Friends and Family: Not on file  . Frequency of Social Gatherings with Friends and Family: Not on file  . Attends Religious Services: Not on file  . Active Member of Clubs or Organizations: Not on file  . Attends Archivist Meetings: Not on file  . Marital Status: Not on file     Family History: The patient's family history includes Heart disease in her mother.  ROS:   Please see the history of present illness.    All other systems reviewed and are negative.  EKGs/Labs/Other Studies Reviewed:    The following studies were reviewed today: DCCV notes, TEE  April 04, 2020 TEE personally reviewed No appendageal thrombus.  Left ventricular function severely decreased.   EKG:  The ekg ordered today demonstrates atrial fibrillation with a rapid ventricular response  Recent Labs: 04/01/2020: Magnesium 2.0; TSH 1.866 04/02/2020: Platelets 214 04/05/2020: B Natriuretic Peptide 57.9 05/10/2020: BUN 28; Creatinine, Ser 0.80; Hemoglobin 13.6; Potassium 4.0; Sodium 143  Recent Lipid Panel    Component Value Date/Time   CHOL 200 04/02/2020 0231   TRIG 104 04/02/2020 0231   HDL 37 (L) 04/02/2020 0231   CHOLHDL 5.4 04/02/2020 0231   VLDL 21 04/02/2020 0231   LDLCALC 142 (H) 04/02/2020 0231    Physical Exam:    VS:  BP 110/74   Pulse 63   Ht 5\' 11"  (1.803 m)   Wt 224 lb (101.6 kg)   SpO2 98%   BMI 31.24 kg/m     Wt Readings from Last 3 Encounters:  05/17/20 224 lb (101.6 kg)  04/29/20 221 lb (100.2 kg)  04/22/20 221 lb 12.8 oz (100.6 kg)    GEN:  Well nourished, well developed in no acute distress HEENT: Normal NECK: No JVD; No carotid bruits LYMPHATICS: No lymphadenopathy CARDIAC: Tachycardic, irregularly irregular, no murmurs, rubs, gallops RESPIRATORY:  Clear to auscultation without rales,  wheezing or rhonchi  ABDOMEN: Soft, non-tender, non-distended MUSCULOSKELETAL: 1+ pitting edema to the knees bilaterally; No deformity  SKIN: Warm and dry NEUROLOGIC:  Alert and oriented x 3 PSYCHIATRIC: Pleasant affect  ASSESSMENT:    1. Permanent atrial fibrillation ([place])   2. Chronic systolic heart failure ([place])   3. Essential hypertension    PLAN:    In order of problems listed above:  1. Permanent atrial fibrillation Permanent given failed cardioversion after amiodarone load.  I discussed the options with the patient, her family and family friend who are with her today.  I think at this point given the severe left ventricular dysfunction and symptomatic heart failure, we should pursue CRT  implant with AV junction ablation.  Given her poor ejection fraction I discussed the utility of implanting a defibrillator lead at the same time and the patient/patient's family would like to proceed with that.  I have utilized the heart smart/ACC shared decision-making tool and my discussion with the patient and the patient's family today.   I will plan to implant a CRT D device without an atrial lead and then bring the patient back 4 weeks later for AV junction ablation.  The patient has an non ischemic CM (EF 20%), NYHA Class II-III CHF and AF.  At this time, she meets criteria for ICD implantation for primary prevention of sudden death.  I have had a thorough discussion with the patient and her family reviewing options.  The patient and their family (if available) have had opportunities to ask questions and have them answered. The patient and I have decided together through a shared decision making process to implant a CRT-D at this time.  We will follow that procedure 4 weeks later with an AVJ ablation.  Risks, benefits, alternatives to CRT-D implantation and AVJ ablation were discussed in detail with the patient today. The patient understands that the risks include but are not limited to  bleeding, infection, pneumothorax, perforation, tamponade, vascular damage, renal failure, MI, stroke, death, inappropriate shocks, and lead dislodgement and wishes to proceed.  We will therefore schedule device implantation and AVJ ablation at the next available time.   2.  Nonischemic cardiomyopathy Ejection fraction 20% with class II-III symptoms despite medical therapy.  Likely related to uncontrolled atrial fibrillation.  Patient's A. fib is refractory to amiodarone and cardioversion.  Would not consider her atrial fibrillation be permanent and proceed with CRT-D implant and AV junction ablation.     Medication Adjustments/Labs and Tests Ordered: Current medicines are reviewed at length with the patient today.  Concerns regarding medicines are outlined above.  Orders Placed This Encounter  Procedures  . EKG 12-Lead   No orders of the defined types were placed in this encounter.    Signed, Lars Mage, MD, [facility]  05/17/2020 5:33 PM    Electrophysiology [facility]    Anticoagulation instructions: Pt to hold Eliquis for 4 dose(s) prior to procedure and we will plan to resume 5 days after the procedure unless otherwise instructed after surgery.  Medication instructions morning of: The patient should hold ALL medications the morning of the procedure   Discharge: Our plan will be to discharge the patient same day after a period of observation

## 2020-05-17 NOTE — Patient Instructions (Addendum)
Medication Instructions:  Your physician has recommended you make the following change in your medication:  1.  STOP taking amiodarone  Lab Work: .None ordered.  If you have labs (blood work) drawn today and your tests are completely normal, you will receive your results only by: Marland Kitchen MyChart Message (if you have MyChart) OR . A paper copy in the mail If you have any lab test that is abnormal or we need to change your treatment, we will call you to review the results.  Testing/Procedures: Your physician has recommended that you have a defibrillator inserted. An implantable cardioverter defibrillator (ICD) is a small device that is placed in your chest or, in rare cases, your abdomen. This device uses electrical pulses or shocks to help control life-threatening, irregular heartbeats that could lead the heart to suddenly stop beating (sudden cardiac arrest). Leads are attached to the ICD that goes into your heart. This is done in the hospital and usually requires an overnight stay. Please see the instruction sheet given to you today for more information.  Follow-Up: SEE INSTRUCTION LETTER

## 2020-05-17 NOTE — H&P (View-Only) (Signed)
Electrophysiology Office Follow up Visit Note:    Date:  05/17/2020   ID:  Melissa Bishop, DOB 12-Oct-1945, MRN [id number]  PCP:  [name], [facility] Cardiologist:  Evalina Field, MD  [facility] Electrophysiologist:  Vickie Epley, MD    Interval History:    Melissa Bishop is a 74 y.o. female who presents for a follow up visit. They were last seen in clinic 04/29/2020. Since their last appointment, she was loaded on amiodarone and underwent a cardioversion on 05/10/2020.  The cardioversion was only transiently successful and she has since returned to atrial fibrillation with a rapid ventricular response.  She continues to have dyspnea on exertion and lower extremity edema.  She is here today with her mother and her close friend who are very closely involved in her care.    Past Medical History:  Diagnosis Date  . CHF (congestive heart failure) ([place])   . Coronary artery disease   . Diabetes mellitus without complication ([place])   . High cholesterol   . Hypertension   . Psoriasis     Past Surgical History:  Procedure Laterality Date  . BREAST EXCISIONAL BIOPSY Right ? more than 30 years  . BREAST SURGERY    . CARDIOVERSION N/A 04/04/2020   Procedure: CARDIOVERSION;  Surgeon: Jerline Pain, MD;  Location: [facility] ENDOSCOPY;  Service: Cardiovascular;  Laterality: N/A;  . CARDIOVERSION N/A 05/10/2020   Procedure: CARDIOVERSION;  Surgeon: [name], MD;  Location: [place];  Service: Cardiovascular;  Laterality: N/A;  . TEE WITHOUT CARDIOVERSION N/A 04/04/2020   Procedure: TRANSESOPHAGEAL ECHOCARDIOGRAM (TEE);  Surgeon: Jerline Pain, MD;  Location: [facility] ENDOSCOPY;  Service: Cardiovascular;  Laterality: N/A;    Current Medications: Current Meds  Medication Sig  . apixaban (ELIQUIS) 5 MG TABS tablet Take 1 tablet (5 mg total) by mouth 2 (two) times daily.  Marland Kitchen atorvastatin (LIPITOR) 80 MG tablet Take 80 mg by mouth at bedtime.   . furosemide (LASIX) 40 MG  tablet Take 1 tablet (40 mg total) by mouth daily.  Marland Kitchen lisinopril (PRINIVIL,ZESTRIL) 20 MG tablet Take 20 mg by mouth at bedtime.   . metFORMIN (GLUCOPHAGE) 500 MG tablet Take 500 mg by mouth 2 (two) times daily with a meal.  . metoprolol succinate (TOPROL-XL) 100 MG 24 hr tablet Take 1 tablet (100 mg total) by mouth daily. Take with or immediately following a meal.  . Multiple Vitamin (MULTIVITAMIN WITH MINERALS) TABS tablet Take 1 tablet by mouth daily.  . Multiple Vitamins-Minerals (PRESERVISION AREDS 2) CAPS Take 1 capsule by mouth daily.   Marland Kitchen spironolactone (ALDACTONE) 25 MG tablet Take 0.5 tablets (12.5 mg total) by mouth daily.  Marland Kitchen Ustekinumab (STELARA) 45 MG/0.5ML SOLN Inject 45 mg into the skin every 3 (three) months.   . [DISCONTINUED] amiodarone (PACERONE) 200 MG tablet Take 1 tablet (200 mg total) by mouth daily. (Patient taking differently: Take 200 mg by mouth at bedtime. )     Allergies:   Patient has no known allergies.   Social History   Socioeconomic History  . Marital status: Single    Spouse name: Not on file  . Number of children: Not on file  . Years of education: Not on file  . Highest education level: Not on file  Occupational History  . Not on file  Tobacco Use  . Smoking status: Former Research scientist (life sciences)  . Smokeless tobacco: Never Used  Vaping Use  . Vaping Use: Never used  Substance and Sexual Activity  .  Alcohol use: No  . Drug use: No  . Sexual activity: Not on file  Other Topics Concern  . Not on file  Social History Narrative  . Not on file   Social Determinants of Health   Financial Resource Strain:   . Difficulty of Paying Living Expenses: Not on file  Food Insecurity:   . Worried About Charity fundraiser in the Last Year: Not on file  . Ran Out of Food in the Last Year: Not on file  Transportation Needs:   . Lack of Transportation (Medical): Not on file  . Lack of Transportation (Non-Medical): Not on file  Physical Activity:   . Days of Exercise  per Week: Not on file  . Minutes of Exercise per Session: Not on file  Stress:   . Feeling of Stress : Not on file  Social Connections:   . Frequency of Communication with Friends and Family: Not on file  . Frequency of Social Gatherings with Friends and Family: Not on file  . Attends Religious Services: Not on file  . Active Member of Clubs or Organizations: Not on file  . Attends Archivist Meetings: Not on file  . Marital Status: Not on file     Family History: The patient's family history includes Heart disease in her mother.  ROS:   Please see the history of present illness.    All other systems reviewed and are negative.  EKGs/Labs/Other Studies Reviewed:    The following studies were reviewed today: DCCV notes, TEE  April 04, 2020 TEE personally reviewed No appendageal thrombus.  Left ventricular function severely decreased.   EKG:  The ekg ordered today demonstrates atrial fibrillation with a rapid ventricular response  Recent Labs: 04/01/2020: Magnesium 2.0; TSH 1.866 04/02/2020: Platelets 214 04/05/2020: B Natriuretic Peptide 57.9 05/10/2020: BUN 28; Creatinine, Ser 0.80; Hemoglobin 13.6; Potassium 4.0; Sodium 143  Recent Lipid Panel    Component Value Date/Time   CHOL 200 04/02/2020 0231   TRIG 104 04/02/2020 0231   HDL 37 (L) 04/02/2020 0231   CHOLHDL 5.4 04/02/2020 0231   VLDL 21 04/02/2020 0231   LDLCALC 142 (H) 04/02/2020 0231    Physical Exam:    VS:  BP 110/74   Pulse 63   Ht 5\' 11"  (1.803 m)   Wt 224 lb (101.6 kg)   SpO2 98%   BMI 31.24 kg/m     Wt Readings from Last 3 Encounters:  05/17/20 224 lb (101.6 kg)  04/29/20 221 lb (100.2 kg)  04/22/20 221 lb 12.8 oz (100.6 kg)    GEN:  Well nourished, well developed in no acute distress HEENT: Normal NECK: No JVD; No carotid bruits LYMPHATICS: No lymphadenopathy CARDIAC: Tachycardic, irregularly irregular, no murmurs, rubs, gallops RESPIRATORY:  Clear to auscultation without rales,  wheezing or rhonchi  ABDOMEN: Soft, non-tender, non-distended MUSCULOSKELETAL: 1+ pitting edema to the knees bilaterally; No deformity  SKIN: Warm and dry NEUROLOGIC:  Alert and oriented x 3 PSYCHIATRIC: Pleasant affect  ASSESSMENT:    1. Permanent atrial fibrillation ([place])   2. Chronic systolic heart failure ([place])   3. Essential hypertension    PLAN:    In order of problems listed above:  1. Permanent atrial fibrillation Permanent given failed cardioversion after amiodarone load.  I discussed the options with the patient, her family and family friend who are with her today.  I think at this point given the severe left ventricular dysfunction and symptomatic heart failure, we should pursue CRT  implant with AV junction ablation.  Given her poor ejection fraction I discussed the utility of implanting a defibrillator lead at the same time and the patient/patient's family would like to proceed with that.  I have utilized the heart smart/ACC shared decision-making tool and my discussion with the patient and the patient's family today.   I will plan to implant a CRT D device without an atrial lead and then bring the patient back 4 weeks later for AV junction ablation.  The patient has an non ischemic CM (EF 20%), NYHA Class II-III CHF and AF.  At this time, she meets criteria for ICD implantation for primary prevention of sudden death.  I have had a thorough discussion with the patient and her family reviewing options.  The patient and their family (if available) have had opportunities to ask questions and have them answered. The patient and I have decided together through a shared decision making process to implant a CRT-D at this time.  We will follow that procedure 4 weeks later with an AVJ ablation.  Risks, benefits, alternatives to CRT-D implantation and AVJ ablation were discussed in detail with the patient today. The patient understands that the risks include but are not limited to  bleeding, infection, pneumothorax, perforation, tamponade, vascular damage, renal failure, MI, stroke, death, inappropriate shocks, and lead dislodgement and wishes to proceed.  We will therefore schedule device implantation and AVJ ablation at the next available time.   2.  Nonischemic cardiomyopathy Ejection fraction 20% with class II-III symptoms despite medical therapy.  Likely related to uncontrolled atrial fibrillation.  Patient's A. fib is refractory to amiodarone and cardioversion.  Would not consider her atrial fibrillation be permanent and proceed with CRT-D implant and AV junction ablation.     Medication Adjustments/Labs and Tests Ordered: Current medicines are reviewed at length with the patient today.  Concerns regarding medicines are outlined above.  Orders Placed This Encounter  Procedures  . EKG 12-Lead   No orders of the defined types were placed in this encounter.    Signed, Lars Mage, MD, [facility]  05/17/2020 5:33 PM    Electrophysiology [facility]    Anticoagulation instructions: Pt to hold Eliquis for 4 dose(s) prior to procedure and we will plan to resume 5 days after the procedure unless otherwise instructed after surgery.  Medication instructions morning of: The patient should hold ALL medications the morning of the procedure   Discharge: Our plan will be to discharge the patient same day after a period of observation

## 2020-05-18 DIAGNOSIS — E119 Type 2 diabetes mellitus without complications: Secondary | ICD-10-CM | POA: Diagnosis not present

## 2020-05-18 DIAGNOSIS — I270 Primary pulmonary hypertension: Secondary | ICD-10-CM | POA: Diagnosis not present

## 2020-05-18 DIAGNOSIS — K5900 Constipation, unspecified: Secondary | ICD-10-CM | POA: Diagnosis not present

## 2020-05-18 DIAGNOSIS — I4891 Unspecified atrial fibrillation: Secondary | ICD-10-CM | POA: Diagnosis not present

## 2020-05-18 DIAGNOSIS — I5023 Acute on chronic systolic (congestive) heart failure: Secondary | ICD-10-CM | POA: Diagnosis not present

## 2020-05-18 DIAGNOSIS — I110 Hypertensive heart disease with heart failure: Secondary | ICD-10-CM | POA: Diagnosis not present

## 2020-05-20 ENCOUNTER — Telehealth: Payer: Self-pay | Admitting: Cardiology

## 2020-05-20 DIAGNOSIS — E119 Type 2 diabetes mellitus without complications: Secondary | ICD-10-CM | POA: Diagnosis not present

## 2020-05-20 DIAGNOSIS — K5900 Constipation, unspecified: Secondary | ICD-10-CM | POA: Diagnosis not present

## 2020-05-20 DIAGNOSIS — I4891 Unspecified atrial fibrillation: Secondary | ICD-10-CM | POA: Diagnosis not present

## 2020-05-20 DIAGNOSIS — I110 Hypertensive heart disease with heart failure: Secondary | ICD-10-CM | POA: Diagnosis not present

## 2020-05-20 DIAGNOSIS — I270 Primary pulmonary hypertension: Secondary | ICD-10-CM | POA: Diagnosis not present

## 2020-05-20 DIAGNOSIS — I5023 Acute on chronic systolic (congestive) heart failure: Secondary | ICD-10-CM | POA: Diagnosis not present

## 2020-05-20 NOTE — Telephone Encounter (Signed)
Called and spoke to Melissa Bishop, audibly SOB on phone but she reports this is unchanged from when she saw MD several days ago.  She states that she is going in and out of rhythm, she thinks daily.  Reports ankle edema - this is unchanged. Denies CP, weight gain/edema (other than ankles), dizziness/light headedness, syncope. She is scheduled for CRT-D implant 10/25.   Spoke to [facility] who states she called just to make sure office was aware of Melissa Bishop status. Informed HH and Melissa Bishop that I don't think treatment plan needs to be updated/changed, unless she worsens.  Aware that most likely symptoms will not gain much improvement until procedures can be completed. Advised to call office/go to ED if issue/symptoms worsen.  Patient verbalized understanding and agreeable to plan.  Aware I will forward to Dr. Quentin Ore for advisement.

## 2020-05-20 NOTE — Telephone Encounter (Signed)
Patient c/o Palpitations:  High priority if patient c/o lightheadedness, shortness of breath, or chest pain  1) How long have you had palpitations/irregular HR/ Afib? Are you having the symptoms now? Having symptoms now, started yesterday   2) Are you currently experiencing lightheadedness, SOB or CP? SOB with exertion, not occurring rn   3) Do you have a history of afib (atrial fibrillation) or irregular heart rhythm? Yes   4) Have you checked your BP or HR? (document readings if available):   HR 94 128/76  5) Are you experiencing any other symptoms? irregular HR    Pt stopped amiodarone Wednesday, 05/18/20 & Afib started occurring yesterday.

## 2020-05-23 DIAGNOSIS — I5023 Acute on chronic systolic (congestive) heart failure: Secondary | ICD-10-CM | POA: Diagnosis not present

## 2020-05-23 DIAGNOSIS — K5900 Constipation, unspecified: Secondary | ICD-10-CM | POA: Diagnosis not present

## 2020-05-23 DIAGNOSIS — I110 Hypertensive heart disease with heart failure: Secondary | ICD-10-CM | POA: Diagnosis not present

## 2020-05-23 DIAGNOSIS — I4891 Unspecified atrial fibrillation: Secondary | ICD-10-CM | POA: Diagnosis not present

## 2020-05-23 DIAGNOSIS — E119 Type 2 diabetes mellitus without complications: Secondary | ICD-10-CM | POA: Diagnosis not present

## 2020-05-23 DIAGNOSIS — I270 Primary pulmonary hypertension: Secondary | ICD-10-CM | POA: Diagnosis not present

## 2020-05-25 DIAGNOSIS — Z23 Encounter for immunization: Secondary | ICD-10-CM | POA: Diagnosis not present

## 2020-06-03 ENCOUNTER — Other Ambulatory Visit (HOSPITAL_COMMUNITY)
Admission: RE | Admit: 2020-06-03 | Discharge: 2020-06-03 | Disposition: A | Discharge status: Home / Self Care | Payer: Medicare Other | Source: Ambulatory Visit | Attending: Cardiology | Admitting: Cardiology

## 2020-06-03 ENCOUNTER — Telehealth: Payer: Self-pay | Admitting: Cardiology

## 2020-06-03 DIAGNOSIS — Z20822 Contact with and (suspected) exposure to covid-19: Secondary | ICD-10-CM | POA: Diagnosis not present

## 2020-06-03 DIAGNOSIS — Z01812 Encounter for preprocedural laboratory examination: Secondary | ICD-10-CM | POA: Diagnosis not present

## 2020-06-03 LAB — SARS CORONAVIRUS 2 (TAT 6-24 HRS): SARS Coronavirus 2: NEGATIVE

## 2020-06-03 NOTE — Telephone Encounter (Signed)
     [place] calling back, she said the pt got a phone call today and received a completely different information about pt procedure on Monday. She wanted to speak with Anderson Malta again for clarification

## 2020-06-03 NOTE — Telephone Encounter (Signed)
Returned call to [employer] driver.  Advised Pt may stay the night after her procedure on 10/25.  If she was discharged it would be later-6-7 pm.  Per driver-might be preferably for Pt to stay overnight for care d/t Pt's deficits and Pt's mom is 74 years old.  Advised would send message to Dr. Quentin Ore.  Also reminded of device check on 11/9.  Advised would meet with them at that time to discuss AV node ablation scheduled for December 6.

## 2020-06-03 NOTE — Telephone Encounter (Signed)
Patient's friend Melissa Bishop states she will be transporting her to her surgery. She would like to know if it will be an over night stay for the patient.

## 2020-06-03 NOTE — Progress Notes (Signed)
Attempted to call patient regarding instructions for Monday's procedure.  No answer and no answering machine.

## 2020-06-03 NOTE — Telephone Encounter (Signed)
Returned call to [employer] driver.  Advised on urgent case had to be put before the Pt's case.  Advised arrival time for Pt will now be 12:30 pm on Monday.

## 2020-06-06 ENCOUNTER — Ambulatory Visit (HOSPITAL_COMMUNITY)
Admission: RE | Disposition: A | Discharge status: Home / Self Care | Payer: Self-pay | Source: Home / Self Care | Attending: Cardiology

## 2020-06-06 ENCOUNTER — Other Ambulatory Visit: Payer: Self-pay

## 2020-06-06 ENCOUNTER — Observation Stay (HOSPITAL_COMMUNITY)
Admission: RE | Admit: 2020-06-06 | Discharge: 2020-06-07 | Disposition: A | Discharge status: Home / Self Care | Payer: Medicare Other | Attending: Cardiology | Admitting: Cardiology

## 2020-06-06 DIAGNOSIS — I4821 Permanent atrial fibrillation: Secondary | ICD-10-CM | POA: Insufficient documentation

## 2020-06-06 DIAGNOSIS — Z7901 Long term (current) use of anticoagulants: Secondary | ICD-10-CM | POA: Diagnosis not present

## 2020-06-06 DIAGNOSIS — I5022 Chronic systolic (congestive) heart failure: Secondary | ICD-10-CM | POA: Insufficient documentation

## 2020-06-06 DIAGNOSIS — I447 Left bundle-branch block, unspecified: Secondary | ICD-10-CM | POA: Diagnosis not present

## 2020-06-06 DIAGNOSIS — Z87891 Personal history of nicotine dependence: Secondary | ICD-10-CM | POA: Insufficient documentation

## 2020-06-06 DIAGNOSIS — I4891 Unspecified atrial fibrillation: Secondary | ICD-10-CM | POA: Diagnosis present

## 2020-06-06 DIAGNOSIS — I428 Other cardiomyopathies: Principal | ICD-10-CM | POA: Insufficient documentation

## 2020-06-06 DIAGNOSIS — E119 Type 2 diabetes mellitus without complications: Secondary | ICD-10-CM | POA: Diagnosis not present

## 2020-06-06 DIAGNOSIS — E785 Hyperlipidemia, unspecified: Secondary | ICD-10-CM | POA: Insufficient documentation

## 2020-06-06 DIAGNOSIS — I110 Hypertensive heart disease with heart failure: Secondary | ICD-10-CM | POA: Diagnosis not present

## 2020-06-06 DIAGNOSIS — Z006 Encounter for examination for normal comparison and control in clinical research program: Secondary | ICD-10-CM | POA: Insufficient documentation

## 2020-06-06 DIAGNOSIS — Z95810 Presence of automatic (implantable) cardiac defibrillator: Secondary | ICD-10-CM

## 2020-06-06 HISTORY — PX: BIV ICD INSERTION CRT-D: EP1195

## 2020-06-06 LAB — CBC
HCT: 43.7 % (ref 36.0–46.0)
Hemoglobin: 13.7 g/dL (ref 12.0–15.0)
MCH: 28.4 pg (ref 26.0–34.0)
MCHC: 31.4 g/dL (ref 30.0–36.0)
MCV: 90.5 fL (ref 80.0–100.0)
Platelets: 214 10*3/uL (ref 150–400)
RBC: 4.83 MIL/uL (ref 3.87–5.11)
RDW: 15.7 % — ABNORMAL HIGH (ref 11.5–15.5)
WBC: 5.6 10*3/uL (ref 4.0–10.5)
nRBC: 0 % (ref 0.0–0.2)

## 2020-06-06 LAB — GLUCOSE, CAPILLARY: Glucose-Capillary: 152 mg/dL — ABNORMAL HIGH (ref 70–99)

## 2020-06-06 LAB — BASIC METABOLIC PANEL
Anion gap: 8 (ref 5–15)
BUN: 28 mg/dL — ABNORMAL HIGH (ref 8–23)
CO2: 26 mmol/L (ref 22–32)
Calcium: 9.6 mg/dL (ref 8.9–10.3)
Chloride: 109 mmol/L (ref 98–111)
Creatinine, Ser: 0.97 mg/dL (ref 0.44–1.00)
GFR, Estimated: >60 mL/min (ref >60)
Glucose, Bld: 150 mg/dL — ABNORMAL HIGH (ref 70–99)
Potassium: 4.3 mmol/L (ref 3.5–5.1)
Sodium: 143 mmol/L (ref 135–145)

## 2020-06-06 SURGERY — BIV ICD INSERTION CRT-D

## 2020-06-06 MED ORDER — SODIUM CHLORIDE 0.9 % IV SOLN: INTRAVENOUS | Status: DC

## 2020-06-06 MED ORDER — HEPARIN (PORCINE) IN NACL 1000-0.9 UT/500ML-% IV SOLN
INTRAVENOUS | Status: AC
  Filled 2020-06-06: qty 500

## 2020-06-06 MED ORDER — SPIRONOLACTONE 12.5 MG HALF TABLET
12.5000 mg | ORAL_TABLET | Freq: Every day | ORAL | Status: DC
  Administered 2020-06-06 – 2020-06-07 (×2): 12.5 mg via ORAL
  Filled 2020-06-06 (×3): qty 1

## 2020-06-06 MED ORDER — HEPARIN (PORCINE) IN NACL 1000-0.9 UT/500ML-% IV SOLN
INTRAVENOUS | Status: DC | PRN
  Administered 2020-06-06: 500 mL

## 2020-06-06 MED ORDER — LISINOPRIL 20 MG PO TABS
20.0000 mg | ORAL_TABLET | Freq: Every day | ORAL | Status: DC
  Administered 2020-06-06: 20 mg via ORAL
  Filled 2020-06-06: qty 1
  Filled 2020-06-06: qty 2

## 2020-06-06 MED ORDER — FENTANYL CITRATE (PF) 100 MCG/2ML IJ SOLN
INTRAMUSCULAR | Status: AC
  Filled 2020-06-06: qty 2

## 2020-06-06 MED ORDER — LIDOCAINE HCL (PF) 1 % IJ SOLN
INTRAMUSCULAR | Status: DC | PRN
  Administered 2020-06-06: 60 mL

## 2020-06-06 MED ORDER — CEFAZOLIN SODIUM-DEXTROSE 2-4 GM/100ML-% IV SOLN
2.0000 g | INTRAVENOUS | Status: AC
  Administered 2020-06-06: 2 g via INTRAVENOUS

## 2020-06-06 MED ORDER — CHLORHEXIDINE GLUCONATE 4 % EX LIQD: 4.0000 "application " | Freq: Once | CUTANEOUS | Status: DC

## 2020-06-06 MED ORDER — MIDAZOLAM HCL 5 MG/5ML IJ SOLN
INTRAMUSCULAR | Status: AC
  Filled 2020-06-06: qty 5

## 2020-06-06 MED ORDER — ATORVASTATIN CALCIUM 80 MG PO TABS
80.0000 mg | ORAL_TABLET | Freq: Every day | ORAL | Status: DC
  Administered 2020-06-06: 80 mg via ORAL
  Filled 2020-06-06: qty 1

## 2020-06-06 MED ORDER — CEFAZOLIN SODIUM-DEXTROSE 2-4 GM/100ML-% IV SOLN
INTRAVENOUS | Status: AC
  Filled 2020-06-06: qty 100

## 2020-06-06 MED ORDER — METOPROLOL SUCCINATE ER 50 MG PO TB24
150.0000 mg | ORAL_TABLET | Freq: Every day | ORAL | Status: DC
  Administered 2020-06-07: 150 mg via ORAL
  Filled 2020-06-06: qty 1

## 2020-06-06 MED ORDER — ONDANSETRON HCL 4 MG/2ML IJ SOLN: 4.0000 mg | Freq: Four times a day (QID) | INTRAMUSCULAR | Status: DC | PRN

## 2020-06-06 MED ORDER — LIDOCAINE HCL 1 % IJ SOLN
INTRAMUSCULAR | Status: AC
  Filled 2020-06-06: qty 60

## 2020-06-06 MED ORDER — FENTANYL CITRATE (PF) 100 MCG/2ML IJ SOLN
INTRAMUSCULAR | Status: DC | PRN
  Administered 2020-06-06: 25 ug via INTRAVENOUS

## 2020-06-06 MED ORDER — TRAMADOL HCL 50 MG PO TABS
50.0000 mg | ORAL_TABLET | Freq: Four times a day (QID) | ORAL | Status: DC | PRN
  Administered 2020-06-06 – 2020-06-07 (×2): 50 mg via ORAL
  Filled 2020-06-06 (×2): qty 1

## 2020-06-06 MED ORDER — IOHEXOL 350 MG/ML SOLN
INTRAVENOUS | Status: DC | PRN
  Administered 2020-06-06: 10 mL

## 2020-06-06 MED ORDER — SODIUM CHLORIDE 0.9 % IV SOLN
INTRAVENOUS | Status: AC
  Filled 2020-06-06: qty 2

## 2020-06-06 MED ORDER — SODIUM CHLORIDE 0.9 % IV SOLN
80.0000 mg | INTRAVENOUS | Status: AC
  Administered 2020-06-06: 80 mg

## 2020-06-06 MED ORDER — MIDAZOLAM HCL 5 MG/5ML IJ SOLN
INTRAMUSCULAR | Status: DC | PRN
  Administered 2020-06-06: 1 mg via INTRAVENOUS

## 2020-06-06 MED ORDER — ACETAMINOPHEN 325 MG PO TABS
325.0000 mg | ORAL_TABLET | ORAL | Status: DC | PRN
  Administered 2020-06-07: 650 mg via ORAL
  Filled 2020-06-06: qty 2

## 2020-06-06 SURGICAL SUPPLY — 18 items
ADAPTER SEALING SSA-EW-09 (ADAPTER) ×3 IMPLANT
BALLN COR SINUS VENO 6F 80CM (BALLOONS) ×1
BALLN COR SINUS VENO 6FR 80 (BALLOONS) ×2
BALLOON COR SINUS VENO 6FR 80 (BALLOONS) ×1 IMPLANT
CABLE SURGICAL S-101-97-12 (CABLE) ×3 IMPLANT
CATH ATTAIN COM SURV 6250V-EH (CATHETERS) ×3 IMPLANT
CATH ATTAIN SEL SURV 6248V-130 (CATHETERS) ×3 IMPLANT
ICD GALLANT HFCRTD CDHFA500Q (ICD Generator) ×3 IMPLANT
LEAD DURATA 7122Q-65CM (Lead) ×3 IMPLANT
LEAD QUARTET 1456Q-86 (Lead) ×1 IMPLANT
PAD PRO RADIOLUCENT 2001M-C (PAD) ×3 IMPLANT
QUARTET 1456Q-86 (Lead) ×3 IMPLANT
SHEATH 7FR PRELUDE SNAP 13 (SHEATH) ×3 IMPLANT
SHEATH 9.5FR PRELUDE SNAP 13 (SHEATH) ×3 IMPLANT
SLITTER 6232ADJ (MISCELLANEOUS) ×3 IMPLANT
TRAY PACEMAKER INSERTION (PACKS) ×3 IMPLANT
WIRE ACUITY WHISPER EDS 4648 (WIRE) ×6 IMPLANT
WIRE HI TORQ VERSACORE-J 145CM (WIRE) ×3 IMPLANT

## 2020-06-06 NOTE — Progress Notes (Signed)
Spoke with Melissa Bishop about who will be staying with her for 24 hours after her procedure. She states that her mom would not be able to care for her, but would be there with her. Pt states to call her friend Melissa Bishop. Message left for Melissa Bishop to return call to [place]. Pt informed that if Melissa Bishop does not call back or can't stay with her she will have to be admitted here over night. Voices understanding.

## 2020-06-06 NOTE — Progress Notes (Signed)
Zona called back states that she will not be able to stay with her.Rennis Harding, Rn called and informed. Pt also informed, Pt will need to be admitted.

## 2020-06-06 NOTE — Progress Notes (Signed)
Orthopedic Tech Progress Note Patient Details:  Melissa Bishop Jun 14, 1946 [id number] RN said patient has arm sling Patient ID: Melissa Bishop, female   DOB: 24-Jul-1946, 74 y.o.   MRN: [id number]   Melissa Bishop 06/06/2020, 5:17 PM

## 2020-06-06 NOTE — Interval H&P Note (Signed)
History and Physical Interval Note:  06/06/2020 2:40 PM  Loralei Radcliffe  has presented today for surgery, with the diagnosis of cardiomyopathy - left bundle branch block.  The various methods of treatment have been discussed with the patient and family. After consideration of risks, benefits and other options for treatment, the patient has consented to  Procedure(s): BIV ICD INSERTION CRT-D (N/A) as a surgical intervention.  The patient's history has been reviewed, patient examined, no change in status, stable for surgery.  I have reviewed the patient's chart and labs.  Questions were answered to the patient's satisfaction.     Legacie Dillingham T Cuyler Vandyken

## 2020-06-06 NOTE — Progress Notes (Signed)
Pt transferred to floor from PACU.  VSS, arm sling in place, dressing CDI.  Oriented to unit, bed in lowest locked position, call bell in reach.

## 2020-06-07 ENCOUNTER — Encounter (HOSPITAL_COMMUNITY): Payer: Self-pay | Admitting: Cardiology

## 2020-06-07 ENCOUNTER — Observation Stay (HOSPITAL_COMMUNITY): Payer: Medicare Other

## 2020-06-07 DIAGNOSIS — Z7901 Long term (current) use of anticoagulants: Secondary | ICD-10-CM | POA: Diagnosis not present

## 2020-06-07 DIAGNOSIS — I5022 Chronic systolic (congestive) heart failure: Secondary | ICD-10-CM | POA: Diagnosis not present

## 2020-06-07 DIAGNOSIS — I4821 Permanent atrial fibrillation: Secondary | ICD-10-CM

## 2020-06-07 DIAGNOSIS — Z006 Encounter for examination for normal comparison and control in clinical research program: Secondary | ICD-10-CM | POA: Diagnosis not present

## 2020-06-07 DIAGNOSIS — Z87891 Personal history of nicotine dependence: Secondary | ICD-10-CM | POA: Diagnosis not present

## 2020-06-07 DIAGNOSIS — E785 Hyperlipidemia, unspecified: Secondary | ICD-10-CM | POA: Diagnosis not present

## 2020-06-07 DIAGNOSIS — E119 Type 2 diabetes mellitus without complications: Secondary | ICD-10-CM | POA: Diagnosis not present

## 2020-06-07 DIAGNOSIS — I428 Other cardiomyopathies: Secondary | ICD-10-CM | POA: Diagnosis not present

## 2020-06-07 DIAGNOSIS — I110 Hypertensive heart disease with heart failure: Secondary | ICD-10-CM | POA: Diagnosis not present

## 2020-06-07 DIAGNOSIS — I517 Cardiomegaly: Secondary | ICD-10-CM | POA: Diagnosis not present

## 2020-06-07 DIAGNOSIS — I447 Left bundle-branch block, unspecified: Secondary | ICD-10-CM | POA: Diagnosis not present

## 2020-06-07 MED ORDER — METOPROLOL SUCCINATE ER 100 MG PO TB24: 150.0000 mg | ORAL_TABLET | Freq: Every day | ORAL | 6 refills | Status: DC

## 2020-06-07 MED ORDER — METOPROLOL SUCCINATE ER 50 MG PO TB24: 150.0000 mg | ORAL_TABLET | Freq: Every day | ORAL | Status: DC

## 2020-06-07 MED FILL — Lidocaine HCl Local Inj 1%: INTRAMUSCULAR | Qty: 60 | Status: AC

## 2020-06-07 NOTE — Discharge Instructions (Signed)
You are scheduled for your ablation procedure on 07/18/2020 at 9:30AM Dr. Mardene Speak nurse with call and confirm with you your procedure instructions ahead of time.        Supplemental Discharge Instructions for  Pacemaker/Defibrillator Patients    Activity No heavy lifting or vigorous activity with your left/right arm for 6 to 8 weeks.  Do not raise your left/right arm above your head for one week.  Gradually raise your affected arm as drawn below.              06/10/2020              06/11/2020               06/12/2020           06/13/2020 __  NO DRIVING for 1 week  ; you may begin driving on 25/0/5397  .  WOUND CARE - Keep the wound area clean and dry.  Do not get this area wet , no showers for one week; you may shower on 06/13/2020    . The tape/steri-strips on your wound will fall off; do not pull them off.  No bandage is needed on the site.  DO  NOT apply any creams, oils, or ointments to the wound area. - If you notice any drainage or discharge from the wound, any swelling or bruising at the site, or you develop a fever > 101? F after you are discharged home, call the office at once.  Special Instructions - You are still able to use cellular telephones; use the ear opposite the side where you have your pacemaker/defibrillator.  Avoid carrying your cellular phone near your device. - When traveling through airports, show security personnel your identification card to avoid being screened in the metal detectors.  Ask the security personnel to use the hand wand. - Avoid arc welding equipment, MRI testing (magnetic resonance imaging), TENS units (transcutaneous nerve stimulators).  Call the office for questions about other devices. - Avoid electrical appliances that are in poor condition or are not properly grounded. - Microwave ovens are safe to be near or to operate.  Additional information for defibrillator patients should your device go off: - If your device goes off ONCE and  you feel fine afterward, notify the device clinic nurses. - If your device goes off ONCE and you do not feel well afterward, call 911. - If your device goes off TWICE, call 911. - If your device goes off THREE times in one day, call 911.  DO NOT DRIVE YOURSELF OR A FAMILY MEMBER WITH A DEFIBRILLATOR TO THE HOSPITAL--CALL 911.

## 2020-06-07 NOTE — Discharge Summary (Signed)
ELECTROPHYSIOLOGY PROCEDURE DISCHARGE SUMMARY    Patient ID: Melissa Bishop,  MRN: [id number], DOB/AGE: 1946/05/16 74 y.o.  Admit date: 06/06/2020 Discharge date: 06/07/2020  Primary Care Physician: [name], MD  Primary Cardiologist: Dr. Audie Box Electrophysiologist: Dr. Quentin Ore  Primary Discharge Diagnosis:  1. NICM 2. Permanent AFib     CHA2DS2Vasc is 5, on Eliquis, appropriately dosed  Secondary Discharge Diagnosis:  1. Chronic CHF      Currently compensated 2. HTN 3. HLD 4. DM  No Known Allergies   Procedures This Admission:  1.  Implantation of a SJM CRT D on 06/06/2020 by Dr Quentin Ore.  The patient received a ICD lead (model Durata [id number], serial [id number]),  Quartet [id number] - [id number] (CS), and  Gallant HF CDHF [id number], Serial [id number]).  DFT's were deferred at time of implant   There were no immediate post procedure complications. 2.  CXR on 06/07/2020 demonstrated no pneumothorax status post device implantation.   Brief HPI: Melissa Bishop is a 74 y.o. female was referred to electrophysiology in the outpatient setting for consideration of ICD implantation and AFib management options.  Past medical history includes above.  The patient has persistent LV dysfunction despite guideline directed therapy.  Risks, benefits, and alternatives to ICD implantation as well as plans to purse AV node ablation a few weeks post implant for her permanent AFib with difficult to control rates, were reviewed with the patient and her family who wished to proceed.    Hospital Course:  The patient was admitted and underwent implantation of an ICD with details as outlined above. She was monitored on telemetry overnight which demonstrated AFib 120's-130's some fatser (known to be her baseline).  Left chest was without hematoma or ecchymosis.  The device was interrogated and found to be functioning normally.  CXR was obtained and demonstrated no pneumothorax status post device  implantation.  Wound care, arm mobility, and restrictions were reviewed with the patient.  The patient feels well, denies any CP or SOB, minimal site discomfort, she was examined by Dr. Quentin Ore and considered stable for discharge to home.   The patient's discharge medications include an ACE/ARB (lisinopril) and beta blocker (metoprolol).   Resume Eliquis Friday (06/10/20) We will increase her metoprolol to 150mg  daily  Physical Exam: Vitals:   06/06/20 2355 06/07/20 0450 06/07/20 0728 06/07/20 0751  BP: 101/72  (!) 128/95 125/88  Pulse: (!) 101 (!) 118 (!) 128 (!) 117  Resp: 20 18 20 18   Temp:  97.8 F (36.6 C) 97.9 F (36.6 C) 97.9 F (36.6 C)  TempSrc:  Oral Oral Oral  SpO2: 94%  94% 95%  Weight:      Height:        GEN- The patient is well appearing, alert and oriented x 3 today.   HEENT: normocephalic, atraumatic; sclera clear, conjunctiva pink; hearing intact; oropharynx clear Lungs-  CTA b/l, normal work of breathing.  No wheezes, rales, rhonchi Heart- irreg-irreg, tachycardic, no murmurs, rubs or gallops, PMI not laterally displaced GI- soft, non-tender, non-distended Extremities- no clubbing, cyanosis, or edema MS- no significant deformity or atrophy Skin- warm and dry, no rash or lesion, left chest without bleeding or hematoma, minimal ecchymosis to the lateral side if the site Psych- euthymic mood, full affect Neuro- no gross defecits  Labs:   Lab Results  Component Value Date   WBC 5.6 06/06/2020   HGB 13.7 06/06/2020   HCT 43.7 06/06/2020   MCV 90.5 06/06/2020   PLT 214 06/06/2020  Recent Labs  Lab 06/06/20 1239  NA 143  K 4.3  CL 109  CO2 26  BUN 28*  CREATININE 0.97  CALCIUM 9.6  GLUCOSE 150*    Discharge Medications:  Allergies as of 06/07/2020   No Known Allergies     Medication List    TAKE these medications   apixaban 5 MG Tabs tablet Commonly known as: Eliquis Take 1 tablet (5 mg total) by mouth 2 (two) times daily. Notes to  patient: Do NOT resume until Friday 06/10/2020   atorvastatin 80 MG tablet Commonly known as: LIPITOR Take 80 mg by mouth at bedtime.   furosemide 40 MG tablet Commonly known as: LASIX Take 1 tablet (40 mg total) by mouth daily.   lisinopril 20 MG tablet Commonly known as: ZESTRIL Take 20 mg by mouth at bedtime.   metFORMIN 500 MG tablet Commonly known as: GLUCOPHAGE Take 500 mg by mouth 2 (two) times daily with a meal.   metoprolol succinate 100 MG 24 hr tablet Commonly known as: TOPROL-XL Take 1.5 tablets (150 mg total) by mouth daily. Take with or immediately following a meal. Start taking on: June 08, 2020 What changed: how much to take   multivitamin with minerals Tabs tablet Take 1 tablet by mouth daily. Centrum   PreserVision AREDS 2 Caps Take 1 capsule by mouth daily.   spironolactone 25 MG tablet Commonly known as: ALDACTONE Take 0.5 tablets (12.5 mg total) by mouth daily.   Stelara 45 MG/0.5ML Soln Generic drug: Ustekinumab Inject 45 mg into the skin every 3 (three) months.   Vitamin D3 50 MCG (2000 UT) Tabs Take 2,000 Units by mouth daily.       Disposition: Home Discharge Instructions    Diet - low sodium heart healthy   Complete by: As directed    Increase activity slowly   Complete by: As directed       Follow-up Information    [facility].   Specialty: Cardiology Why: 06/21/2020 @ 2:40PM, wound check visit Contact information: [address] [place]       Vickie Epley, MD Follow up.   Specialties: Cardiology, Radiology Why: 09/12/2020 @ 3:15PM Contact information: [facility] [address] [phone]               Duration of Discharge Encounter: Greater than 30 minutes including physician time.  Venetia Night, PA-C 06/07/2020 9:05 AM

## 2020-06-08 DIAGNOSIS — I110 Hypertensive heart disease with heart failure: Secondary | ICD-10-CM | POA: Diagnosis not present

## 2020-06-08 DIAGNOSIS — I5023 Acute on chronic systolic (congestive) heart failure: Secondary | ICD-10-CM | POA: Diagnosis not present

## 2020-06-08 DIAGNOSIS — I270 Primary pulmonary hypertension: Secondary | ICD-10-CM | POA: Diagnosis not present

## 2020-06-08 DIAGNOSIS — E119 Type 2 diabetes mellitus without complications: Secondary | ICD-10-CM | POA: Diagnosis not present

## 2020-06-08 DIAGNOSIS — K5900 Constipation, unspecified: Secondary | ICD-10-CM | POA: Diagnosis not present

## 2020-06-08 DIAGNOSIS — I4891 Unspecified atrial fibrillation: Secondary | ICD-10-CM | POA: Diagnosis not present

## 2020-06-09 ENCOUNTER — Telehealth: Payer: Self-pay

## 2020-06-09 NOTE — Telephone Encounter (Signed)
The pt caregiver Marlis Edelson called and the patient legal guardian gave me permission to talk with Zona. The pt received a phone to monitor her device but the pt is not in our system. I contacted [employer] rep to help me with setting up the patient app. I told Marlis Edelson I will call her back at [phone] when I have more information.

## 2020-06-09 NOTE — Telephone Encounter (Signed)
I spoke with the pt legal guardian and let her know that they do not have to do anything with the phone. The phone needs to be left by the patient bedside and it will check the patient between the hours of midnight and 5 am. I left a message for Zona per the guardian request.

## 2020-06-10 DIAGNOSIS — I5023 Acute on chronic systolic (congestive) heart failure: Secondary | ICD-10-CM | POA: Diagnosis not present

## 2020-06-10 DIAGNOSIS — Z950 Presence of cardiac pacemaker: Secondary | ICD-10-CM | POA: Diagnosis not present

## 2020-06-10 DIAGNOSIS — K5900 Constipation, unspecified: Secondary | ICD-10-CM | POA: Diagnosis not present

## 2020-06-10 DIAGNOSIS — I110 Hypertensive heart disease with heart failure: Secondary | ICD-10-CM | POA: Diagnosis not present

## 2020-06-10 DIAGNOSIS — Z48812 Encounter for surgical aftercare following surgery on the circulatory system: Secondary | ICD-10-CM | POA: Diagnosis not present

## 2020-06-10 DIAGNOSIS — I4891 Unspecified atrial fibrillation: Secondary | ICD-10-CM | POA: Diagnosis not present

## 2020-06-10 DIAGNOSIS — L409 Psoriasis, unspecified: Secondary | ICD-10-CM | POA: Diagnosis not present

## 2020-06-10 DIAGNOSIS — Z7901 Long term (current) use of anticoagulants: Secondary | ICD-10-CM | POA: Diagnosis not present

## 2020-06-10 DIAGNOSIS — Z7984 Long term (current) use of oral hypoglycemic drugs: Secondary | ICD-10-CM | POA: Diagnosis not present

## 2020-06-10 DIAGNOSIS — E119 Type 2 diabetes mellitus without complications: Secondary | ICD-10-CM | POA: Diagnosis not present

## 2020-06-10 DIAGNOSIS — I447 Left bundle-branch block, unspecified: Secondary | ICD-10-CM | POA: Diagnosis not present

## 2020-06-10 DIAGNOSIS — I270 Primary pulmonary hypertension: Secondary | ICD-10-CM | POA: Diagnosis not present

## 2020-06-10 DIAGNOSIS — E7800 Pure hypercholesterolemia, unspecified: Secondary | ICD-10-CM | POA: Diagnosis not present

## 2020-06-13 ENCOUNTER — Telehealth: Payer: Self-pay

## 2020-06-13 NOTE — Telephone Encounter (Signed)
[facility] alert received 06/11/20 for presenting VS 100-180 bpm. Patient has hx of poor rate control, per d/c note 120-130's.   Patient called. States she has felt "ok, it comes and goes." Requested patient send a manual transmission so I can see presenting. Patient states she will send in 5 minutes and call back after she has sent it.

## 2020-06-13 NOTE — Telephone Encounter (Signed)
Manual transmission received 06/13/20. Presenting AF 100-190's. Patient complaints of fatigue. No other complaints noted. Spoke to [place], would like to patient to start diltiazem 120 mg daily if BP > 100. Patient is not able to check blood pressure. Advised patient she will need to follow-up with AF Clinic for poor VR control. Patient agreeable to plan.  ED precautions given.

## 2020-06-14 DIAGNOSIS — Z950 Presence of cardiac pacemaker: Secondary | ICD-10-CM | POA: Diagnosis not present

## 2020-06-14 DIAGNOSIS — Z48812 Encounter for surgical aftercare following surgery on the circulatory system: Secondary | ICD-10-CM | POA: Diagnosis not present

## 2020-06-14 DIAGNOSIS — I447 Left bundle-branch block, unspecified: Secondary | ICD-10-CM | POA: Diagnosis not present

## 2020-06-14 DIAGNOSIS — I5023 Acute on chronic systolic (congestive) heart failure: Secondary | ICD-10-CM | POA: Diagnosis not present

## 2020-06-14 DIAGNOSIS — I4891 Unspecified atrial fibrillation: Secondary | ICD-10-CM | POA: Diagnosis not present

## 2020-06-14 DIAGNOSIS — I110 Hypertensive heart disease with heart failure: Secondary | ICD-10-CM | POA: Diagnosis not present

## 2020-06-14 NOTE — Telephone Encounter (Signed)
Called and spoke with patient.  She is aware of appt 06/16/20 at 1:30 pm with Adline Peals, PA.

## 2020-06-16 ENCOUNTER — Ambulatory Visit (HOSPITAL_COMMUNITY)
Admission: RE | Admit: 2020-06-16 | Discharge: 2020-06-16 | Disposition: A | Discharge status: Home / Self Care | Payer: Medicare Other | Source: Ambulatory Visit | Attending: Physician Assistant | Admitting: Physician Assistant

## 2020-06-16 ENCOUNTER — Other Ambulatory Visit: Payer: Self-pay

## 2020-06-16 ENCOUNTER — Encounter (HOSPITAL_COMMUNITY): Payer: Self-pay | Admitting: Physician Assistant

## 2020-06-16 VITALS — BP 122/82 | HR 145 | Ht 71.0 in | Wt 223.4 lb

## 2020-06-16 DIAGNOSIS — Z7984 Long term (current) use of oral hypoglycemic drugs: Secondary | ICD-10-CM | POA: Diagnosis not present

## 2020-06-16 DIAGNOSIS — I5022 Chronic systolic (congestive) heart failure: Secondary | ICD-10-CM | POA: Diagnosis not present

## 2020-06-16 DIAGNOSIS — Z7901 Long term (current) use of anticoagulants: Secondary | ICD-10-CM | POA: Diagnosis not present

## 2020-06-16 DIAGNOSIS — I428 Other cardiomyopathies: Secondary | ICD-10-CM | POA: Insufficient documentation

## 2020-06-16 DIAGNOSIS — Z6831 Body mass index (BMI) 31.0-31.9, adult: Secondary | ICD-10-CM | POA: Insufficient documentation

## 2020-06-16 DIAGNOSIS — Z79899 Other long term (current) drug therapy: Secondary | ICD-10-CM | POA: Diagnosis not present

## 2020-06-16 DIAGNOSIS — E669 Obesity, unspecified: Secondary | ICD-10-CM | POA: Diagnosis not present

## 2020-06-16 DIAGNOSIS — Z950 Presence of cardiac pacemaker: Secondary | ICD-10-CM | POA: Insufficient documentation

## 2020-06-16 DIAGNOSIS — I4821 Permanent atrial fibrillation: Secondary | ICD-10-CM

## 2020-06-16 DIAGNOSIS — E119 Type 2 diabetes mellitus without complications: Secondary | ICD-10-CM | POA: Diagnosis not present

## 2020-06-16 DIAGNOSIS — I4811 Longstanding persistent atrial fibrillation: Secondary | ICD-10-CM | POA: Insufficient documentation

## 2020-06-16 DIAGNOSIS — D6869 Other thrombophilia: Secondary | ICD-10-CM | POA: Diagnosis not present

## 2020-06-16 DIAGNOSIS — Z87891 Personal history of nicotine dependence: Secondary | ICD-10-CM | POA: Insufficient documentation

## 2020-06-16 DIAGNOSIS — I110 Hypertensive heart disease with heart failure: Secondary | ICD-10-CM | POA: Insufficient documentation

## 2020-06-16 MED ORDER — DILTIAZEM HCL ER COATED BEADS 120 MG PO CP24: 120.0000 mg | ORAL_CAPSULE | Freq: Every day | ORAL | 1 refills | Status: DC

## 2020-06-16 NOTE — Progress Notes (Signed)
Primary Care Physician: [name], MD Primary Cardiologist: Dr Audie Box Primary Electrophysiologist: Dr Quentin Ore Referring Physician: Tommye Standard    Melissa Bishop is a 74 y.o. female with a history of HTN, DM, NICM s/p CRT-D, permanent atrial fibrillation who presents for follow up in the [facility]. Patient was loaded on amiodarone and underwent DCCV on 05/10/20 which was unsuccessful. She had CRT implant 06/06/20 with future plans for AV node ablation. Patient is on Eliquis for a CHADS2VASC score of 6. The device clinic received an alert for elevated V rates and diltiazem was recommended. However, she never got the medication. Patient reports she feels well today despite her rapid rates. She denies increased SOB, edema, or fatigue. She denies any bleeding issues on anticoagulation.   Today, she denies symptoms of palpitations, chest pain, shortness of breath, orthopnea, PND, lower extremity edema, dizziness, presyncope, syncope, snoring, daytime somnolence, bleeding, or neurologic sequela. The patient is tolerating medications without difficulties and is otherwise without complaint today.    Atrial Fibrillation Risk Factors:  she does not have symptoms or diagnosis of sleep apnea. she does not have a history of rheumatic fever.   she has a BMI of Body mass index is 31.16 kg/m.Marland Kitchen Filed Weights   06/16/20 1347  Weight: 101.3 kg    Family History  Problem Relation Age of Onset  . Heart disease Mother        Required pacemaker in her 68s     Atrial Fibrillation Management history:  Previous antiarrhythmic drugs: amiodarone Previous cardioversions: 05/10/20 Previous ablations: none CHADS2VASC score: 6 Anticoagulation history: Eliquis   Past Medical History:  Diagnosis Date  . CHF (congestive heart failure) ([place])   . Coronary artery disease   . Diabetes mellitus without complication ([place])   . High cholesterol   . Hypertension   . Psoriasis      Past Surgical History:  Procedure Laterality Date  . BIV ICD INSERTION CRT-D N/A 06/06/2020   Procedure: BIV ICD INSERTION CRT-D;  Surgeon: Vickie Epley, MD;  Location: [facility];  Service: Cardiovascular;  Laterality: N/A;  . BREAST EXCISIONAL BIOPSY Right ? more than 30 years  . BREAST SURGERY    . CARDIOVERSION N/A 04/04/2020   Procedure: CARDIOVERSION;  Surgeon: Jerline Pain, MD;  Location[facility];  Service: Cardiovascular;  Laterality: N/A;  . CARDIOVERSION N/A 05/10/2020   Procedure: CARDIOVERSION;  Surgeon: [name], MD;  Location: [facility];  Service: Cardiovascular;  Laterality: N/A;  . TEE WITHOUT CARDIOVERSION N/A 04/04/2020   Procedure: TRANSESOPHAGEAL ECHOCARDIOGRAM (TEE);  Surgeon: Jerline Pain, MD;  Location: [facility] ENDOSCOPY;  Service: Cardiovascular;  Laterality: N/A;    Current Outpatient Medications  Medication Sig Dispense Refill  . apixaban (ELIQUIS) 5 MG TABS tablet Take 1 tablet (5 mg total) by mouth 2 (two) times daily. 90 tablet 1  . atorvastatin (LIPITOR) 80 MG tablet Take 80 mg by mouth at bedtime.     . Cholecalciferol (VITAMIN D3) 50 MCG (2000 UT) TABS Take 2,000 Units by mouth daily.    . furosemide (LASIX) 40 MG tablet Take 1 tablet (40 mg total) by mouth daily. 90 tablet 1  . lisinopril (PRINIVIL,ZESTRIL) 20 MG tablet Take 20 mg by mouth at bedtime.     . metFORMIN (GLUCOPHAGE) 500 MG tablet Take 500 mg by mouth 2 (two) times daily with a meal.    . metoprolol succinate (TOPROL-XL) 100 MG 24 hr tablet Take 1.5 tablets (150 mg  total) by mouth daily. Take with or immediately following a meal. 45 tablet 6  . Multiple Vitamin (MULTIVITAMIN WITH MINERALS) TABS tablet Take 1 tablet by mouth daily. Centrum    . Multiple Vitamins-Minerals (PRESERVISION AREDS 2) CAPS Take 1 capsule by mouth daily.     Marland Kitchen spironolactone (ALDACTONE) 25 MG tablet Take 0.5 tablets (12.5 mg total) by mouth daily. 30 tablet 3  . Ustekinumab (STELARA) 45  MG/0.5ML SOLN Inject 45 mg into the skin every 3 (three) months.     . diltiazem (CARDIZEM CD) 120 MG 24 hr capsule Take 1 capsule (120 mg total) by mouth daily. 30 capsule 1   No current facility-administered medications for this encounter.    No Known Allergies  Social History   Socioeconomic History  . Marital status: Single    Spouse name: Not on file  . Number of children: Not on file  . Years of education: Not on file  . Highest education level: Not on file  Occupational History  . Not on file  Tobacco Use  . Smoking status: Former Research scientist (life sciences)  . Smokeless tobacco: Never Used  Vaping Use  . Vaping Use: Never used  Substance and Sexual Activity  . Alcohol use: No  . Drug use: No  . Sexual activity: Not on file  Other Topics Concern  . Not on file  Social History Narrative  . Not on file   Social Determinants of Health   Financial Resource Strain:   . Difficulty of Paying Living Expenses: Not on file  Food Insecurity:   . Worried About Charity fundraiser in the Last Year: Not on file  . Ran Out of Food in the Last Year: Not on file  Transportation Needs:   . Lack of Transportation (Medical): Not on file  . Lack of Transportation (Non-Medical): Not on file  Physical Activity:   . Days of Exercise per Week: Not on file  . Minutes of Exercise per Session: Not on file  Stress:   . Feeling of Stress : Not on file  Social Connections:   . Frequency of Communication with Friends and Family: Not on file  . Frequency of Social Gatherings with Friends and Family: Not on file  . Attends Religious Services: Not on file  . Active Member of Clubs or Organizations: Not on file  . Attends Archivist Meetings: Not on file  . Marital Status: Not on file  Intimate Partner Violence:   . Fear of Current or Ex-Partner: Not on file  . Emotionally Abused: Not on file  . Physically Abused: Not on file  . Sexually Abused: Not on file     ROS- All systems are reviewed and  negative except as per the HPI above.  Physical Exam: Vitals:   06/16/20 1347  BP: 122/82  Pulse: (!) 145  Weight: 101.3 kg  Height: 5\' 11"  (1.803 m)    GEN- The patient is well appearing elderly obese female, alert and oriented x 3 today.   Head- normocephalic, atraumatic Eyes-  Sclera clear, conjunctiva pink Ears- hearing intact Oropharynx- clear Neck- supple  Lungs- Clear to ausculation bilaterally, normal work of breathing Heart- irregular rate and rhythm, tachycardia, no murmurs, rubs or gallops  GI- soft, NT, ND, + BS Extremities- no clubbing, cyanosis, or edema MS- no significant deformity or atrophy Skin- no rash or lesion Psych- euthymic mood, full affect Neuro- strength and sensation are intact  Wt Readings from Last 3 Encounters:  06/16/20 101.3  kg  06/06/20 99.8 kg  05/17/20 101.6 kg    EKG today demonstrates afib HR 145, QRS 112, QTc 487  Echo 04/01/20 demonstrated  1. Left ventricular ejection fraction, by estimation, is 25%. The left  ventricle has severely decreased function. Global hypokinesis. Left  ventricular endocardial border not optimally defined to evaluate regional  wall motion despite the use of Definity  contrast. Left ventricular diastolic parameters are indeterminate.  2. Right ventricular systolic function is severely reduced. The right  ventricular size is mildly enlarged. There is mildly elevated pulmonary  artery systolic pressure. The estimated right ventricular systolic  pressure is 23.3 mmHg.  3. Left atrial size was mildly dilated.  4. Right atrial size was mildly dilated.  5. The mitral valve is grossly normal. No evidence of mitral valve  regurgitation.  6. The aortic valve is grossly normal. Aortic valve regurgitation is not  visualized.  7. The inferior vena cava is dilated in size with <50% respiratory  variability, suggesting right atrial pressure of 15 mmHg.   Epic records are reviewed at length  today  CHA2DS2-VASc Score = 6  The patient's score is based upon: CHF History: 1 HTN History: 1 Diabetes History: 1 Stroke History: 0 Vascular Disease History: 1 Age Score: 1 Gender Score: 1      ASSESSMENT AND PLAN: 1. Permanent Atrial Fibrillation (ICD10:  I48.11) The patient's CHA2DS2-VASc score is 6, indicating a 9.7% annual risk of stroke.   Plans for AV node ablation noted.  Start diltiazem 120 mg daily, recheck adequacy of rate control next week. Continue Toprol 150 mg daily Continue Eliquis 5 mg BID  2. Secondary Hypercoagulable State (ICD10:  D68.69) The patient is at significant risk for stroke/thromboembolism based upon her CHA2DS2-VASc Score of 6.  Continue Apixaban (Eliquis).   3. Obesity Body mass index is 31.16 kg/m. Lifestyle modification was discussed at length including regular exercise and weight reduction.  4. Chronic systolic CHF NICM, s/p CRT-D. No signs or symptoms of fluid overload today.  5. HTN Stable, no changes today.   Follow up with device clinic and Dr Audie Box as scheduled.    [facility] [address] [place], [place] [address] [phone] 06/16/2020 4:52 PM

## 2020-06-16 NOTE — Patient Instructions (Signed)
Cardizem 120mg  once a day -- start this evening

## 2020-06-20 DIAGNOSIS — Z48812 Encounter for surgical aftercare following surgery on the circulatory system: Secondary | ICD-10-CM | POA: Diagnosis not present

## 2020-06-20 DIAGNOSIS — Z950 Presence of cardiac pacemaker: Secondary | ICD-10-CM | POA: Diagnosis not present

## 2020-06-20 DIAGNOSIS — I447 Left bundle-branch block, unspecified: Secondary | ICD-10-CM | POA: Diagnosis not present

## 2020-06-20 DIAGNOSIS — I110 Hypertensive heart disease with heart failure: Secondary | ICD-10-CM | POA: Diagnosis not present

## 2020-06-20 DIAGNOSIS — I5023 Acute on chronic systolic (congestive) heart failure: Secondary | ICD-10-CM | POA: Diagnosis not present

## 2020-06-20 DIAGNOSIS — I4891 Unspecified atrial fibrillation: Secondary | ICD-10-CM | POA: Diagnosis not present

## 2020-06-21 ENCOUNTER — Telehealth: Payer: Self-pay

## 2020-06-21 ENCOUNTER — Other Ambulatory Visit: Payer: Self-pay

## 2020-06-21 ENCOUNTER — Other Ambulatory Visit: Payer: Medicare Other

## 2020-06-21 ENCOUNTER — Ambulatory Visit (INDEPENDENT_AMBULATORY_CARE_PROVIDER_SITE_OTHER): Payer: Medicare Other | Admitting: Emergency Medicine

## 2020-06-21 DIAGNOSIS — I428 Other cardiomyopathies: Secondary | ICD-10-CM

## 2020-06-21 DIAGNOSIS — Z95810 Presence of automatic (implantable) cardiac defibrillator: Secondary | ICD-10-CM | POA: Diagnosis not present

## 2020-06-21 DIAGNOSIS — I4821 Permanent atrial fibrillation: Secondary | ICD-10-CM | POA: Diagnosis not present

## 2020-06-21 DIAGNOSIS — I4891 Unspecified atrial fibrillation: Secondary | ICD-10-CM | POA: Diagnosis not present

## 2020-06-21 LAB — CUP PACEART INCLINIC DEVICE CHECK
Battery Remaining Longevity: 97 mo
Brady Statistic RA Percent Paced: 0 %
Brady Statistic RV Percent Paced: 0.01 %
Date Time Interrogation Session: 20211109152419
HighPow Impedance: 67.5 Ohm
Implantable Lead Implant Date: 20211025
Implantable Lead Implant Date: 20211025
Implantable Lead Location: 753858
Implantable Lead Location: 753860
Implantable Pulse Generator Implant Date: 20211025
Lead Channel Impedance Value: 475 Ohm
Lead Channel Impedance Value: 875 Ohm
Lead Channel Pacing Threshold Amplitude: 0.5 V
Lead Channel Pacing Threshold Amplitude: 0.75 V
Lead Channel Pacing Threshold Pulse Width: 0.5 ms
Lead Channel Pacing Threshold Pulse Width: 0.5 ms
Lead Channel Sensing Intrinsic Amplitude: 12 mV
Lead Channel Setting Pacing Amplitude: 3.5 V
Lead Channel Setting Pacing Amplitude: 3.5 V
Lead Channel Setting Pacing Pulse Width: 0.5 ms
Lead Channel Setting Pacing Pulse Width: 0.5 ms
Lead Channel Setting Sensing Sensitivity: 0.5 mV
Pulse Gen Serial Number: 810006964

## 2020-06-21 MED ORDER — CEPHALEXIN 500 MG PO CAPS: 500.0000 mg | ORAL_CAPSULE | Freq: Four times a day (QID) | ORAL | 0 refills | Status: AC

## 2020-06-21 NOTE — Telephone Encounter (Signed)
Work up complete for AV node ablation.  Will get lab work today. Covid test scheduled.  Will give instruction letter today at wound check appt.  Work up complete.

## 2020-06-21 NOTE — Patient Instructions (Signed)
Please call device clinic [phone] if you see increased redness, drainage fever or chills.

## 2020-06-21 NOTE — Progress Notes (Signed)
Wound check appointment. Steri-strips removed. Wound noted small amount of redness towards end of incision, yellow/clear drainage noted on dressing/site. Dr. Curt Bears in to assess, VO Keflex 500 mg QID, x5 days.  Incision edges approximated. Wound care/cleaning education completed. Advised if note signs of worsening redness, drainage fever or chills to call DC. Phone number provided. Normal device function. Thresholds, sensing, and impedances consistent with implant measurements. Device programmed at 3.5V for extra safety margin until 3 month visit. Histogram distribution appropriate for patient and level of activity. No ventricular arrhythmias noted. Patient educated about wound care, arm mobility, lifting restrictions, shock plan. Home remote monitor connecting fine. Patient education provided to not send transmissions daily. ROV in 3 months with implanting physician.  Patient has AV node ablation scheduled 07/18/20. LW

## 2020-06-22 DIAGNOSIS — L400 Psoriasis vulgaris: Secondary | ICD-10-CM | POA: Diagnosis not present

## 2020-06-22 DIAGNOSIS — Z79899 Other long term (current) drug therapy: Secondary | ICD-10-CM | POA: Diagnosis not present

## 2020-06-22 LAB — CBC WITH DIFFERENTIAL/PLATELET
Basophils Absolute: 0 10*3/uL (ref 0.0–0.2)
Basos: 1 %
EOS (ABSOLUTE): 0.1 10*3/uL (ref 0.0–0.4)
Eos: 2 %
Hematocrit: 40.2 % (ref 34.0–46.6)
Hemoglobin: 13.1 g/dL (ref 11.1–15.9)
Immature Grans (Abs): 0 10*3/uL (ref 0.0–0.1)
Immature Granulocytes: 0 %
Lymphocytes Absolute: 1.5 10*3/uL (ref 0.7–3.1)
Lymphs: 24 %
MCH: 29.4 pg (ref 26.6–33.0)
MCHC: 32.6 g/dL (ref 31.5–35.7)
MCV: 90 fL (ref 79–97)
Monocytes Absolute: 0.5 10*3/uL (ref 0.1–0.9)
Monocytes: 8 %
Neutrophils Absolute: 4 10*3/uL (ref 1.4–7.0)
Neutrophils: 65 %
Platelets: 205 10*3/uL (ref 150–450)
RBC: 4.46 x10E6/uL (ref 3.77–5.28)
RDW: 14.2 % (ref 11.7–15.4)
WBC: 6.1 10*3/uL (ref 3.4–10.8)

## 2020-06-22 LAB — BASIC METABOLIC PANEL
BUN/Creatinine Ratio: 27 (ref 12–28)
BUN: 21 mg/dL (ref 8–27)
CO2: 25 mmol/L (ref 20–29)
Calcium: 9.4 mg/dL (ref 8.7–10.3)
Chloride: 110 mmol/L — ABNORMAL HIGH (ref 96–106)
Creatinine, Ser: 0.77 mg/dL (ref 0.57–1.00)
GFR calc Af Amer: 88 mL/min/{1.73_m2} (ref >59)
GFR calc non Af Amer: 76 mL/min/{1.73_m2} (ref >59)
Glucose: 105 mg/dL — ABNORMAL HIGH (ref 65–99)
Potassium: 4.5 mmol/L (ref 3.5–5.2)
Sodium: 148 mmol/L — ABNORMAL HIGH (ref 134–144)

## 2020-06-29 NOTE — Progress Notes (Signed)
Cardiology Office Note:   Date:  06/30/2020  NAME:  Melissa Bishop    MRN: [id number] DOB:  12-27-1945   PCP:  [name], MD  Cardiologist:  Evalina Field, MD  Electrophysiologist:  Vickie Epley, MD   Referring MD: [name], MD   Chief Complaint  Patient presents with  . Congestive Heart Failure   History of Present Illness:   Melissa Bishop is a 74 y.o. female with a hx of systolic HF, persistent Afib (failed amiodarone), DM, HTN who presents for follow-up. Failed repeat attempt at [place]. CRT-D placed. EP with plans for AVN ablation.   She presents with her mother.  They report they are unsure of the decision to proceed with AV nodal ablation.  Her heart rate is still uncontrolled today.  She is in A. fib with RVR.  This is despite diltiazem and metoprolol.  She also has increased lower extremity edema.  She is also progressively worsening with shortness of breath.  She appears to have at least 4 to 5 pounds of fluid on her per review of weights.  She denies any chest pain or rapid heartbeat sensation.  She has never felt any of her atrial fibrillation.  We did discuss the whole reason for CRT-D was to proceed with AV nodal ablation.  The mother reports that this was not explained to her during the procedure.  I did discuss this with Dr. Lars Mage who reports this was explained very well to them.  I think there is some confusion with possibly another family member.  I have asked her to reach out to the son.  She is given at some times of when he will be available.  I did spend an extensive amount of time explaining that her heart is weak and she is holding onto a lot of fluid because of her cardiomyopathy.  It appears they do not quite understand this.  She denies any chest pain or shortness of breath in the office.  Noticeably tachypneic on my examination.  Problem List  1. Atrial fibrillation, persistent -TEE/DCCV 04/05/2020 failed -converted to NSR on amiodarone    -recurrence on amiodarone  2. Systolic HF, EF 50-53% -arrhythmia related  -CRT-D 05/2020 3. Diabetes -A1c 7.5 4. HLD -T chol 200, HDL 37, LDL 142, TG 104  Past Medical History: Past Medical History:  Diagnosis Date  . CHF (congestive heart failure) ([place])   . Coronary artery disease   . Diabetes mellitus without complication ([place])   . High cholesterol   . Hypertension   . Psoriasis     Past Surgical History: Past Surgical History:  Procedure Laterality Date  . BIV ICD INSERTION CRT-D N/A 06/06/2020   Procedure: BIV ICD INSERTION CRT-D;  Surgeon: Vickie Epley, MD;  Location: [facility];  Service: Cardiovascular;  Laterality: N/A;  . BREAST EXCISIONAL BIOPSY Right ? more than 30 years  . BREAST SURGERY    . CARDIOVERSION N/A 04/04/2020   Procedure: CARDIOVERSION;  Surgeon: Jerline Pain, MD;  Location: [facility] ENDOSCOPY;  Service: Cardiovascular;  Laterality: N/A;  . CARDIOVERSION N/A 05/10/2020   Procedure: CARDIOVERSION;  Surgeon: [name], MD;  Location: [facility];  Service: Cardiovascular;  Laterality: N/A;  . TEE WITHOUT CARDIOVERSION N/A 04/04/2020   Procedure: TRANSESOPHAGEAL ECHOCARDIOGRAM (TEE);  Surgeon: Jerline Pain, MD;  Location[facility] END[facility]COPY;  Service: Cardiovascular;  Laterality: N/A;    Current Medications: Current Meds  Medication Sig  . apixaban (ELIQUIS) 5 MG TABS tablet  Take 1 tablet (5 mg total) by mouth 2 (two) times daily.  Marland Kitchen atorvastatin (LIPITOR) 80 MG tablet Take 80 mg by mouth at bedtime.   . Cholecalciferol (VITAMIN D3) 50 MCG (2000 UT) TABS Take 2,000 Units by mouth daily.  Marland Kitchen diltiazem (CARDIZEM CD) 120 MG 24 hr capsule Take 1 capsule (120 mg total) by mouth daily.  . furosemide (LASIX) 40 MG tablet Take 1 tablet (40 mg total) by mouth daily.  Marland Kitchen lisinopril (PRINIVIL,ZESTRIL) 20 MG tablet Take 20 mg by mouth at bedtime.   . metFORMIN (GLUCOPHAGE) 500 MG tablet Take 500 mg by mouth 2 (two) times daily with a meal.  .  metoprolol succinate (TOPROL-XL) 100 MG 24 hr tablet Take 1.5 tablets (150 mg total) by mouth daily. Take with or immediately following a meal.  . Multiple Vitamin (MULTIVITAMIN WITH MINERALS) TABS tablet Take 1 tablet by mouth daily. Centrum  . Multiple Vitamins-Minerals (PRESERVISION AREDS 2) CAPS Take 1 capsule by mouth daily.   Marland Kitchen spironolactone (ALDACTONE) 25 MG tablet Take 0.5 tablets (12.5 mg total) by mouth daily.  Marland Kitchen Ustekinumab (STELARA) 45 MG/0.5ML SOLN Inject 45 mg into the skin every 3 (three) months.      Allergies:    Patient has no known allergies.   Social History: Social History   Socioeconomic History  . Marital status: Single    Spouse name: Not on file  . Number of children: Not on file  . Years of education: Not on file  . Highest education level: Not on file  Occupational History  . Not on file  Tobacco Use  . Smoking status: Former Research scientist (life sciences)  . Smokeless tobacco: Never Used  Vaping Use  . Vaping Use: Never used  Substance and Sexual Activity  . Alcohol use: No  . Drug use: No  . Sexual activity: Not on file  Other Topics Concern  . Not on file  Social History Narrative  . Not on file   Social Determinants of Health   Financial Resource Strain:   . Difficulty of Paying Living Expenses: Not on file  Food Insecurity:   . Worried About Charity fundraiser in the Last Year: Not on file  . Ran Out of Food in the Last Year: Not on file  Transportation Needs:   . Lack of Transportation (Medical): Not on file  . Lack of Transportation (Non-Medical): Not on file  Physical Activity:   . Days of Exercise per Week: Not on file  . Minutes of Exercise per Session: Not on file  Stress:   . Feeling of Stress : Not on file  Social Connections:   . Frequency of Communication with Friends and Family: Not on file  . Frequency of Social Gatherings with Friends and Family: Not on file  . Attends Religious Services: Not on file  . Active Member of Clubs or  Organizations: Not on file  . Attends Archivist Meetings: Not on file  . Marital Status: Not on file     Family History: The patient's family history includes Heart disease in her mother.  ROS:   All other ROS reviewed and negative. Pertinent positives noted in the HPI.     EKGs/Labs/Other Studies Reviewed:   The following studies were personally reviewed by me today:  TTE 04/01/2020 1. Left ventricular ejection fraction, by estimation, is 25%. The left  ventricle has severely decreased function. Global hypokinesis. Left  ventricular endocardial border not optimally defined to evaluate regional  wall motion  despite the use of Definity  contrast. Left ventricular diastolic parameters are indeterminate.  2. Right ventricular systolic function is severely reduced. The right  ventricular size is mildly enlarged. There is mildly elevated pulmonary  artery systolic pressure. The estimated right ventricular systolic  pressure is 63.8 mmHg.  3. Left atrial size was mildly dilated.  4. Right atrial size was mildly dilated.  5. The mitral valve is grossly normal. No evidence of mitral valve  regurgitation.  6. The aortic valve is grossly normal. Aortic valve regurgitation is not  visualized.  7. The inferior vena cava is dilated in size with <50% respiratory  variability, suggesting right atrial pressure of 15 mmHg.   Recent Labs: 04/01/2020: Magnesium 2.0; TSH 1.866 04/05/2020: B Natriuretic Peptide 57.9 06/21/2020: BUN 21; Creatinine, Ser 0.77; Hemoglobin 13.1; Platelets 205; Potassium 4.5; Sodium 148   Recent Lipid Panel    Component Value Date/Time   CHOL 200 04/02/2020 0231   TRIG 104 04/02/2020 0231   HDL 37 (L) 04/02/2020 0231   CHOLHDL 5.4 04/02/2020 0231   VLDL 21 04/02/2020 0231   LDLCALC 142 (H) 04/02/2020 0231    Physical Exam:   VS:  BP 100/70 (BP Location: Left Arm, Patient Position: Sitting)   Pulse 68   Ht 5\' 11"  (1.803 m)   Wt 228 lb 12.8 oz  (103.8 kg)   SpO2 99%   BMI 31.91 kg/m    Wt Readings from Last 3 Encounters:  06/30/20 228 lb 12.8 oz (103.8 kg)  06/16/20 223 lb 6.4 oz (101.3 kg)  06/06/20 220 lb (99.8 kg)    General: Well nourished, well developed, in no acute distress Heart: Atraumatic, normal size  Eyes: PEERLA, EOMI  Neck: Supple, JVD 8 to 10 cm of water Endocrine: No thryomegaly Cardiac: Normal S1, S2; irregular rhythm, tachycardia noted Lungs: Clear to auscultation bilaterally, no wheezing, rhonchi or rales  Abd: Soft, nontender, no hepatomegaly  Ext: 2+ pitting edema Musculoskeletal: No deformities, BUE and BLE strength normal and equal Skin: Warm and dry, no rashes   Neuro: Alert and oriented to person, place, time, and situation, CNII-XII grossly intact, no focal deficits  Psych: Normal mood and affect   ASSESSMENT:   Melissa Bishop is a 74 y.o. female who presents for the following: 1. Persistent atrial fibrillation ([place])   2. Chronic systolic heart failure ([place])   3. Mixed hyperlipidemia     PLAN:   1. Persistent atrial fibrillation (HCC) -Rather persistent atrial fibrillation despite amiodarone.  Likely history of in her cardiomyopathy.  She is status post CRT-D placement.  There was a plan for an AV nodal ablation with EP.  Apparently the mother reports that they were unsure if they want to proceed with this.  I did explain that the entire reason for the pacemaker defibrillator was for this procedure.  I do have concerns about worsening heart failure.  I do not think her heart will recover without AV node ablation.  She reports she would like to discuss this further with electrophysiology. -In the interim, we will continue her diltiazem and metoprolol.  I am reluctant to increase this given her marginal blood pressure 100/70.  She will continue Eliquis 5 mg twice daily.  Hopefully we can get this resolved quickly.  2. Chronic systolic heart failure (HCC) -EF 25-30%.  Likely driven by atrial  fibrillation that has been rather difficult to control.  Likely the best option is AV nodal ablation.  She is status post CRT-D.  Family  still deciding it appears.  I have asked Dr. Quentin Ore to reach back out to them and discuss with other family members about how best to proceed. -She is volume up today.  Up at least 5 pound since her last visit.  Increase Lasix to 40 mg twice daily for 3 days and then resume 40 mg daily. -I have plans to transition to [place] today.  However given the uncertainty regarding proceeding with AV node ablation I would like to hold on any titration at this point.  I think we need to have this resolved before we start making major changes to medications given the reluctance to proceed with this very necessary procedure. -Continue lisinopril 20 mg daily, continue Aldactone 25 mg daily.  She is on metoprolol succinate 150 mg daily.  3. Mixed hyperlipidemia -Continue high intensity statin.  Most recent LDL cholesterol 142.  She will have to have this rechecked at some point.  She is diabetic with an A1c of 7.5.   Disposition: Return in about 3 months (around 09/30/2020).  Medication Adjustments/Labs and Tests Ordered: Current medicines are reviewed at length with the patient today.  Concerns regarding medicines are outlined above.  No orders of the defined types were placed in this encounter.  No orders of the defined types were placed in this encounter.   Patient Instructions  Medication Instructions:  Increase Lasix to 40 mg twice a day for 3 days, then decrease back to 40 mg daily.   *If you need a refill on your cardiac medications before your next appointment, please call your pharmacy*   Lab Work: None   Testing/Procedures: None   Follow-Up: At [facility], you and your health needs are our priority.  As part of our continuing mission to provide you with exceptional heart care, we have created designated Provider Care Teams.  These Care Teams  include your primary Cardiologist (physician) and Advanced Practice Providers (APPs -  Physician Assistants and Nurse Practitioners) who all work together to provide you with the care you need, when you need it.  We recommend signing up for the patient portal called "MyChart".  Sign up information is provided on this After Visit Summary.  MyChart is used to connect with patients for Virtual Visits (Telemedicine).  Patients are able to view lab/test results, encounter notes, upcoming appointments, etc.  Non-urgent messages can be sent to your provider as well.   To learn more about what you can do with MyChart, go to [email or web address].    Your next appointment:   3 month(s)  The format for your next appointment:   In Person  Provider:   Eleonore Chiquito, MD       Time Spent with Patient: I have spent a total of 25 minutes with patient reviewing hospital notes, telemetry, EKGs, labs and examining the patient as well as establishing an assessment and plan that was discussed with the patient.  > 50% of time was spent in direct patient care.  Signed, [facility] Naegeli. Audie Box, [place]  [address] [place], [place] [address] [phone]  06/30/2020 2:51 PM

## 2020-06-30 ENCOUNTER — Telehealth: Payer: Self-pay | Admitting: Cardiology

## 2020-06-30 ENCOUNTER — Encounter: Payer: Self-pay | Admitting: Cardiovascular Disease

## 2020-06-30 ENCOUNTER — Ambulatory Visit (INDEPENDENT_AMBULATORY_CARE_PROVIDER_SITE_OTHER): Payer: Medicare Other | Admitting: Cardiovascular Disease

## 2020-06-30 ENCOUNTER — Other Ambulatory Visit: Payer: Self-pay

## 2020-06-30 VITALS — BP 100/70 | HR 68 | Ht 71.0 in | Wt 228.8 lb

## 2020-06-30 DIAGNOSIS — E782 Mixed hyperlipidemia: Secondary | ICD-10-CM | POA: Diagnosis not present

## 2020-06-30 DIAGNOSIS — I4819 Other persistent atrial fibrillation: Secondary | ICD-10-CM

## 2020-06-30 DIAGNOSIS — I5022 Chronic systolic (congestive) heart failure: Secondary | ICD-10-CM | POA: Diagnosis not present

## 2020-06-30 NOTE — Patient Instructions (Signed)
Medication Instructions:  Increase Lasix to 40 mg twice a day for 3 days, then decrease back to 40 mg daily.   *If you need a refill on your cardiac medications before your next appointment, please call your pharmacy*   Lab Work: None   Testing/Procedures: None   Follow-Up: At [facility], you and your health needs are our priority.  As part of our continuing mission to provide you with exceptional heart care, we have created designated Provider Care Teams.  These Care Teams include your primary Cardiologist (physician) and Advanced Practice Providers (APPs -  Physician Assistants and Nurse Practitioners) who all work together to provide you with the care you need, when you need it.  We recommend signing up for the patient portal called "MyChart".  Sign up information is provided on this After Visit Summary.  MyChart is used to connect with patients for Virtual Visits (Telemedicine).  Patients are able to view lab/test results, encounter notes, upcoming appointments, etc.  Non-urgent messages can be sent to your provider as well.   To learn more about what you can do with MyChart, go to [email or web address].    Your next appointment:   3 month(s)  The format for your next appointment:   In Person  Provider:   Eleonore Chiquito, MD

## 2020-06-30 NOTE — Telephone Encounter (Signed)
Called Sabriel Borromeo who is the patient's brother this evening (phone [phone]).  He returned my phone call and we had a lengthy discussion about Ms. Sadler's atrial fibrillation and recent admission to the hospital.  I relayed to him the treatment team's concerns that Ms. Duval's atrial fibrillation is leading to decreased left ventricular function and decompensated heart failure.  I explained the treatment plan to him in depth including the CRT implant and plan for upcoming AV junction ablation.  We reviewed the risks again of the AV junction ablation.  Clair Gulling was very understanding of the plan and thought that it would certainly be in Parish's best interest to proceed with the AV junction ablation in an effort to obtain adequate rate control of her atrial fibrillation and hopefully improve her left ventricular function and most importantly her symptoms.  He expressed gratitude to the treatment team for their efforts and told me that he would speak to his mother to reassure her that this was the best path forward.  Lysbeth Galas T. Quentin Ore, MD, [facility] Cardiac Electrophysiology

## 2020-07-01 NOTE — Telephone Encounter (Signed)
Thanks for talking with the family.  This is clearly what is needed for the patient.  I think her mother is just overwhelmed.  [place] T. Audie Box, [place]  [address], [place], [place] [address] [phone]  8:10 AM

## 2020-07-08 DIAGNOSIS — Z48812 Encounter for surgical aftercare following surgery on the circulatory system: Secondary | ICD-10-CM | POA: Diagnosis not present

## 2020-07-08 DIAGNOSIS — Z950 Presence of cardiac pacemaker: Secondary | ICD-10-CM | POA: Diagnosis not present

## 2020-07-08 DIAGNOSIS — I110 Hypertensive heart disease with heart failure: Secondary | ICD-10-CM | POA: Diagnosis not present

## 2020-07-08 DIAGNOSIS — I4891 Unspecified atrial fibrillation: Secondary | ICD-10-CM | POA: Diagnosis not present

## 2020-07-08 DIAGNOSIS — I447 Left bundle-branch block, unspecified: Secondary | ICD-10-CM | POA: Diagnosis not present

## 2020-07-08 DIAGNOSIS — I5023 Acute on chronic systolic (congestive) heart failure: Secondary | ICD-10-CM | POA: Diagnosis not present

## 2020-07-10 DIAGNOSIS — I447 Left bundle-branch block, unspecified: Secondary | ICD-10-CM | POA: Diagnosis not present

## 2020-07-10 DIAGNOSIS — Z7984 Long term (current) use of oral hypoglycemic drugs: Secondary | ICD-10-CM | POA: Diagnosis not present

## 2020-07-10 DIAGNOSIS — E119 Type 2 diabetes mellitus without complications: Secondary | ICD-10-CM | POA: Diagnosis not present

## 2020-07-10 DIAGNOSIS — E7800 Pure hypercholesterolemia, unspecified: Secondary | ICD-10-CM | POA: Diagnosis not present

## 2020-07-10 DIAGNOSIS — I4891 Unspecified atrial fibrillation: Secondary | ICD-10-CM | POA: Diagnosis not present

## 2020-07-10 DIAGNOSIS — I5023 Acute on chronic systolic (congestive) heart failure: Secondary | ICD-10-CM | POA: Diagnosis not present

## 2020-07-10 DIAGNOSIS — L409 Psoriasis, unspecified: Secondary | ICD-10-CM | POA: Diagnosis not present

## 2020-07-10 DIAGNOSIS — I110 Hypertensive heart disease with heart failure: Secondary | ICD-10-CM | POA: Diagnosis not present

## 2020-07-10 DIAGNOSIS — Z48812 Encounter for surgical aftercare following surgery on the circulatory system: Secondary | ICD-10-CM | POA: Diagnosis not present

## 2020-07-10 DIAGNOSIS — Z950 Presence of cardiac pacemaker: Secondary | ICD-10-CM | POA: Diagnosis not present

## 2020-07-10 DIAGNOSIS — K5900 Constipation, unspecified: Secondary | ICD-10-CM | POA: Diagnosis not present

## 2020-07-10 DIAGNOSIS — Z7901 Long term (current) use of anticoagulants: Secondary | ICD-10-CM | POA: Diagnosis not present

## 2020-07-10 DIAGNOSIS — I270 Primary pulmonary hypertension: Secondary | ICD-10-CM | POA: Diagnosis not present

## 2020-07-15 ENCOUNTER — Other Ambulatory Visit (HOSPITAL_COMMUNITY)
Admission: RE | Admit: 2020-07-15 | Discharge: 2020-07-15 | Disposition: A | Discharge status: Home / Self Care | Payer: Medicare Other | Source: Ambulatory Visit | Attending: Cardiology | Admitting: Cardiology

## 2020-07-15 DIAGNOSIS — Z01812 Encounter for preprocedural laboratory examination: Secondary | ICD-10-CM | POA: Diagnosis not present

## 2020-07-15 DIAGNOSIS — Z20822 Contact with and (suspected) exposure to covid-19: Secondary | ICD-10-CM | POA: Insufficient documentation

## 2020-07-15 LAB — SARS CORONAVIRUS 2 (TAT 6-24 HRS): SARS Coronavirus 2: NEGATIVE

## 2020-07-18 ENCOUNTER — Ambulatory Visit (HOSPITAL_COMMUNITY)
Admission: RE | Admit: 2020-07-18 | Discharge: 2020-07-18 | Disposition: A | Discharge status: Home / Self Care | Payer: Medicare Other | Attending: Cardiology | Admitting: Cardiology

## 2020-07-18 ENCOUNTER — Ambulatory Visit (HOSPITAL_COMMUNITY)
Admission: RE | Disposition: A | Discharge status: Home / Self Care | Payer: Medicare Other | Source: Home / Self Care | Attending: Cardiology

## 2020-07-18 ENCOUNTER — Other Ambulatory Visit: Payer: Self-pay

## 2020-07-18 DIAGNOSIS — Z79899 Other long term (current) drug therapy: Secondary | ICD-10-CM | POA: Diagnosis not present

## 2020-07-18 DIAGNOSIS — I4821 Permanent atrial fibrillation: Secondary | ICD-10-CM | POA: Diagnosis not present

## 2020-07-18 DIAGNOSIS — I110 Hypertensive heart disease with heart failure: Secondary | ICD-10-CM | POA: Diagnosis not present

## 2020-07-18 DIAGNOSIS — Z8249 Family history of ischemic heart disease and other diseases of the circulatory system: Secondary | ICD-10-CM | POA: Diagnosis not present

## 2020-07-18 DIAGNOSIS — I5022 Chronic systolic (congestive) heart failure: Secondary | ICD-10-CM | POA: Insufficient documentation

## 2020-07-18 DIAGNOSIS — Z87891 Personal history of nicotine dependence: Secondary | ICD-10-CM | POA: Insufficient documentation

## 2020-07-18 DIAGNOSIS — Z7984 Long term (current) use of oral hypoglycemic drugs: Secondary | ICD-10-CM | POA: Diagnosis not present

## 2020-07-18 DIAGNOSIS — Z7901 Long term (current) use of anticoagulants: Secondary | ICD-10-CM | POA: Diagnosis not present

## 2020-07-18 HISTORY — PX: AV NODE ABLATION: EP1193

## 2020-07-18 LAB — GLUCOSE, CAPILLARY: Glucose-Capillary: 168 mg/dL — ABNORMAL HIGH (ref 70–99)

## 2020-07-18 SURGERY — AV NODE ABLATION
Anesthesia: LOCAL

## 2020-07-18 MED ORDER — SODIUM CHLORIDE 0.9 % IV SOLN: 250.0000 mL | INTRAVENOUS | Status: DC | PRN

## 2020-07-18 MED ORDER — CEFAZOLIN SODIUM-DEXTROSE 2-4 GM/100ML-% IV SOLN
INTRAVENOUS | Status: AC
  Filled 2020-07-18: qty 100

## 2020-07-18 MED ORDER — HEPARIN (PORCINE) IN NACL 1000-0.9 UT/500ML-% IV SOLN
INTRAVENOUS | Status: DC | PRN
  Administered 2020-07-18: 500 mL

## 2020-07-18 MED ORDER — SODIUM CHLORIDE 0.9 % IV SOLN: INTRAVENOUS | Status: DC

## 2020-07-18 MED ORDER — FENTANYL CITRATE (PF) 100 MCG/2ML IJ SOLN
INTRAMUSCULAR | Status: DC | PRN
  Administered 2020-07-18 (×2): 25 ug via INTRAVENOUS

## 2020-07-18 MED ORDER — MIDAZOLAM HCL 5 MG/5ML IJ SOLN
INTRAMUSCULAR | Status: AC
  Filled 2020-07-18: qty 5

## 2020-07-18 MED ORDER — SODIUM CHLORIDE 0.9% FLUSH: 3.0000 mL | Freq: Two times a day (BID) | INTRAVENOUS | Status: DC

## 2020-07-18 MED ORDER — BUPIVACAINE HCL (PF) 0.25 % IJ SOLN
INTRAMUSCULAR | Status: DC | PRN
  Administered 2020-07-18: 30 mL

## 2020-07-18 MED ORDER — CEFAZOLIN SODIUM-DEXTROSE 2-3 GM-%(50ML) IV SOLR
INTRAVENOUS | Status: AC | PRN
  Administered 2020-07-18: 2 g via INTRAVENOUS

## 2020-07-18 MED ORDER — ACETAMINOPHEN 325 MG PO TABS
650.0000 mg | ORAL_TABLET | ORAL | Status: DC | PRN
  Filled 2020-07-18: qty 2

## 2020-07-18 MED ORDER — MIDAZOLAM HCL 5 MG/5ML IJ SOLN
INTRAMUSCULAR | Status: DC | PRN
  Administered 2020-07-18 (×2): 1 mg via INTRAVENOUS

## 2020-07-18 MED ORDER — FENTANYL CITRATE (PF) 100 MCG/2ML IJ SOLN
INTRAMUSCULAR | Status: AC
  Filled 2020-07-18: qty 2

## 2020-07-18 MED ORDER — ONDANSETRON HCL 4 MG/2ML IJ SOLN: 4.0000 mg | Freq: Four times a day (QID) | INTRAMUSCULAR | Status: DC | PRN

## 2020-07-18 MED ORDER — SODIUM CHLORIDE 0.9% FLUSH: 3.0000 mL | INTRAVENOUS | Status: DC | PRN

## 2020-07-18 SURGICAL SUPPLY — 8 items
CATH CELSIUS THERMO F CV 7FR (ABLATOR) ×2 IMPLANT
CLOSURE PERCLOSE PROSTYLE (VASCULAR PRODUCTS) ×2 IMPLANT
MAT PREVALON FULL STRYKER (MISCELLANEOUS) ×2 IMPLANT
PACK EP LATEX FREE (CUSTOM PROCEDURE TRAY) ×1
PACK EP LF (CUSTOM PROCEDURE TRAY) ×1 IMPLANT
PAD PRO RADIOLUCENT 2001M-C (PAD) ×2 IMPLANT
SHEATH PINNACLE 8F 10CM (SHEATH) ×2 IMPLANT
SHEATH PROBE COVER 6X72 (BAG) ×2 IMPLANT

## 2020-07-18 NOTE — Progress Notes (Signed)
Discharge instructions reviewed with patient and family. Verbalized understanding. 

## 2020-07-18 NOTE — Progress Notes (Signed)
Report given and care transferred to me at this time.

## 2020-07-18 NOTE — Discharge Instructions (Signed)

## 2020-07-18 NOTE — H&P (Signed)
Electrophysiology Office Follow up Visit Note:    Date:  05/17/2020   ID:  Melissa Bishop, DOB 14-Sep-1945, MRN [id number]  PCP:  [name], [facility] Cardiologist:  Evalina Field, MD  [facility] Electrophysiologist:  Vickie Epley, MD    Interval History:    Melissa Bishop is a 74 y.o. female who presents for a follow up visit. They were last seen in clinic 04/29/2020. Since their last appointment, she was loaded on amiodarone and underwent a cardioversion on 05/10/2020.  The cardioversion was only transiently successful and she has since returned to atrial fibrillation with a rapid ventricular response.  She continues to have dyspnea on exertion and lower extremity edema.  She is here today with her mother and her close friend who are very closely involved in her care.        Past Medical History:  Diagnosis Date  . CHF (congestive heart failure) ([place])   . Coronary artery disease   . Diabetes mellitus without complication ([place])   . High cholesterol   . Hypertension   . Psoriasis          Past Surgical History:  Procedure Laterality Date  . BREAST EXCISIONAL BIOPSY Right ? more than 30 years  . BREAST SURGERY    . CARDIOVERSION N/A 04/04/2020   Procedure: CARDIOVERSION;  Surgeon: Jerline Pain, MD;  Location: [facility] ENDOSCOPY;  Service: Cardiovascular;  Laterality: N/A;  . CARDIOVERSION N/A 05/10/2020   Procedure: CARDIOVERSION;  Surgeon: [name], MD;  Location: [place];  Service: Cardiovascular;  Laterality: N/A;  . TEE WITHOUT CARDIOVERSION N/A 04/04/2020   Procedure: TRANSESOPHAGEAL ECHOCARDIOGRAM (TEE);  Surgeon: Jerline Pain, MD;  Location: [facility] ENDOSCOPY;  Service: Cardiovascular;  Laterality: N/A;    Current Medications: Active Medications      Current Meds  Medication Sig  . apixaban (ELIQUIS) 5 MG TABS tablet Take 1 tablet (5 mg total) by mouth 2 (two) times daily.  Marland Kitchen atorvastatin (LIPITOR) 80 MG tablet  Take 80 mg by mouth at bedtime.   . furosemide (LASIX) 40 MG tablet Take 1 tablet (40 mg total) by mouth daily.  Marland Kitchen lisinopril (PRINIVIL,ZESTRIL) 20 MG tablet Take 20 mg by mouth at bedtime.   . metFORMIN (GLUCOPHAGE) 500 MG tablet Take 500 mg by mouth 2 (two) times daily with a meal.  . metoprolol succinate (TOPROL-XL) 100 MG 24 hr tablet Take 1 tablet (100 mg total) by mouth daily. Take with or immediately following a meal.  . Multiple Vitamin (MULTIVITAMIN WITH MINERALS) TABS tablet Take 1 tablet by mouth daily.  . Multiple Vitamins-Minerals (PRESERVISION AREDS 2) CAPS Take 1 capsule by mouth daily.   Marland Kitchen spironolactone (ALDACTONE) 25 MG tablet Take 0.5 tablets (12.5 mg total) by mouth daily.  Marland Kitchen Ustekinumab (STELARA) 45 MG/0.5ML SOLN Inject 45 mg into the skin every 3 (three) months.   . [DISCONTINUED] amiodarone (PACERONE) 200 MG tablet Take 1 tablet (200 mg total) by mouth daily. (Patient taking differently: Take 200 mg by mouth at bedtime. )       Allergies:   Patient has no known allergies.   Social History   Socioeconomic History  . Marital status: Single    Spouse name: Not on file  . Number of children: Not on file  . Years of education: Not on file  . Highest education level: Not on file  Occupational History  . Not on file  Tobacco Use  . Smoking status: Former Research scientist (life sciences)  .  Smokeless tobacco: Never Used  Vaping Use  . Vaping Use: Never used  Substance and Sexual Activity  . Alcohol use: No  . Drug use: No  . Sexual activity: Not on file  Other Topics Concern  . Not on file  Social History Narrative  . Not on file   Social Determinants of Health      Financial Resource Strain:   . Difficulty of Paying Living Expenses: Not on file  Food Insecurity:   . Worried About Charity fundraiser in the Last Year: Not on file  . Ran Out of Food in the Last Year: Not on file  Transportation Needs:   . Lack of Transportation (Medical): Not on file  . Lack of  Transportation (Non-Medical): Not on file  Physical Activity:   . Days of Exercise per Week: Not on file  . Minutes of Exercise per Session: Not on file  Stress:   . Feeling of Stress : Not on file  Social Connections:   . Frequency of Communication with Friends and Family: Not on file  . Frequency of Social Gatherings with Friends and Family: Not on file  . Attends Religious Services: Not on file  . Active Member of Clubs or Organizations: Not on file  . Attends Archivist Meetings: Not on file  . Marital Status: Not on file     Family History: The patient's family history includes Heart disease in her mother.  ROS:   Please see the history of present illness.    All other systems reviewed and are negative.  EKGs/Labs/Other Studies Reviewed:    The following studies were reviewed today: DCCV notes, TEE  April 04, 2020 TEE personally reviewed No appendageal thrombus.  Left ventricular function severely decreased.   EKG:  The ekg ordered today demonstrates atrial fibrillation with a rapid ventricular response  Recent Labs: 04/01/2020: Magnesium 2.0; TSH 1.866 04/02/2020: Platelets 214 04/05/2020: B Natriuretic Peptide 57.9 05/10/2020: BUN 28; Creatinine, Ser 0.80; Hemoglobin 13.6; Potassium 4.0; Sodium 143  Recent Lipid Panel Labs (Brief)          Component Value Date/Time   CHOL 200 04/02/2020 0231   TRIG 104 04/02/2020 0231   HDL 37 (L) 04/02/2020 0231   CHOLHDL 5.4 04/02/2020 0231   VLDL 21 04/02/2020 0231   LDLCALC 142 (H) 04/02/2020 0231      Physical Exam:    VS:  BP 110/74   Pulse 63   Ht 5\' 11"  (1.803 m)   Wt 224 lb (101.6 kg)   SpO2 98%   BMI 31.24 kg/m        Wt Readings from Last 3 Encounters:  05/17/20 224 lb (101.6 kg)  04/29/20 221 lb (100.2 kg)  04/22/20 221 lb 12.8 oz (100.6 kg)    GEN:  Well nourished, well developed in no acute distress HEENT: Normal NECK: No JVD; No carotid bruits LYMPHATICS: No  lymphadenopathy CARDIAC: Tachycardic, irregularly irregular, no murmurs, rubs, gallops RESPIRATORY:  Clear to auscultation without rales, wheezing or rhonchi  ABDOMEN: Soft, non-tender, non-distended MUSCULOSKELETAL: 1+ pitting edema to the knees bilaterally; No deformity  SKIN: Warm and dry NEUROLOGIC:  Alert and oriented x 3 PSYCHIATRIC: Pleasant affect  ASSESSMENT:    1. Permanent atrial fibrillation ([place])   2. Chronic systolic heart failure ([place])   3. Essential hypertension    PLAN:    In order of problems listed above:  1. Permanent atrial fibrillation Permanent given failed cardioversion after amiodarone load.  I  discussed the options with the patient, her family and family friend who are with her today.  I think at this point given the severe left ventricular dysfunction and symptomatic heart failure, we should pursue CRT implant with AV junction ablation.  Given her poor ejection fraction I discussed the utility of implanting a defibrillator lead at the same time and the patient/patient's family would like to proceed with that.  I have utilized the heart smart/ACC shared decision-making tool and my discussion with the patient and the patient's family today.   I will plan to implant a CRT D device without an atrial lead and then bring the patient back 4 weeks later for AV junction ablation.  The patient has an non ischemic CM (EF 20%), NYHA Class II-III CHF and AF.  At this time, she meets criteria for ICD implantation for primary prevention of sudden death.  I have had a thorough discussion with the patient and her family reviewing options.  The patient and their family (if available) have had opportunities to ask questions and have them answered. The patient and I have decided together through a shared decision making process to implant a CRT-D at this time.  We will follow that procedure 4 weeks later with an AVJ ablation.  Risks, benefits, alternatives to CRT-D  implantation and AVJ ablation were discussed in detail with the patient today. The patient understands that the risks include but are not limited to bleeding, infection, pneumothorax, perforation, tamponade, vascular damage, renal failure, MI, stroke, death, inappropriate shocks, and lead dislodgement and wishes to proceed.  We will therefore schedule device implantation and AVJ ablation at the next available time.   2.  Nonischemic cardiomyopathy Ejection fraction 20% with class II-III symptoms despite medical therapy.  Likely related to uncontrolled atrial fibrillation.  Patient's A. fib is refractory to amiodarone and cardioversion.  Would not consider her atrial fibrillation be permanent and proceed with CRT-D implant and AV junction ablation.    -------------------------------------------------------------------------------- I have seen, examined the patient, and reviewed the above assessment and plan.   Plan for AVJ ablation today. CRT well healed. Procedure discussed in detail with the patient at the bedside including the risks and she is in agreement.   Vickie Epley, MD 07/18/2020 9:47 AM

## 2020-07-19 ENCOUNTER — Encounter (HOSPITAL_COMMUNITY): Payer: Self-pay | Admitting: Cardiology

## 2020-07-21 DIAGNOSIS — I5023 Acute on chronic systolic (congestive) heart failure: Secondary | ICD-10-CM | POA: Diagnosis not present

## 2020-07-21 DIAGNOSIS — Z950 Presence of cardiac pacemaker: Secondary | ICD-10-CM | POA: Diagnosis not present

## 2020-07-21 DIAGNOSIS — I110 Hypertensive heart disease with heart failure: Secondary | ICD-10-CM | POA: Diagnosis not present

## 2020-07-21 DIAGNOSIS — Z48812 Encounter for surgical aftercare following surgery on the circulatory system: Secondary | ICD-10-CM | POA: Diagnosis not present

## 2020-07-21 DIAGNOSIS — I447 Left bundle-branch block, unspecified: Secondary | ICD-10-CM | POA: Diagnosis not present

## 2020-07-21 DIAGNOSIS — I4891 Unspecified atrial fibrillation: Secondary | ICD-10-CM | POA: Diagnosis not present

## 2020-07-21 MED FILL — Cefazolin Sodium-Dextrose IV Solution 2 GM/100ML-4%: INTRAVENOUS | Qty: 100 | Status: AC

## 2020-07-26 ENCOUNTER — Encounter: Payer: Self-pay | Admitting: Podiatry

## 2020-07-26 ENCOUNTER — Ambulatory Visit (INDEPENDENT_AMBULATORY_CARE_PROVIDER_SITE_OTHER): Payer: Medicare Other | Admitting: Podiatry

## 2020-07-26 ENCOUNTER — Other Ambulatory Visit: Payer: Self-pay

## 2020-07-26 DIAGNOSIS — M79675 Pain in left toe(s): Secondary | ICD-10-CM

## 2020-07-26 DIAGNOSIS — E119 Type 2 diabetes mellitus without complications: Secondary | ICD-10-CM | POA: Diagnosis not present

## 2020-07-26 DIAGNOSIS — B351 Tinea unguium: Secondary | ICD-10-CM

## 2020-07-26 DIAGNOSIS — M79674 Pain in right toe(s): Secondary | ICD-10-CM | POA: Diagnosis not present

## 2020-07-26 DIAGNOSIS — L84 Corns and callosities: Secondary | ICD-10-CM | POA: Diagnosis not present

## 2020-07-31 NOTE — Progress Notes (Signed)
Subjective: Melissa Bishop is a pleasant 74 y.o. female patient seen today preventative diabetic foot care and painful callus(es) b/l great toes and painful mycotic toenails b/l that are difficult to trim. Pain interferes with ambulation. Aggravating factors include wearing enclosed shoe gear. Pain is relieved with periodic professional debridement.   She voices no new pedal problems on today's visit.  Patient Active Problem List   Diagnosis Date Noted  . Secondary hypercoagulable state ([place]) 06/16/2020  . Atrial fibrillation ([place]) 06/06/2020  . Persistent atrial fibrillation ([place])   . Acute systolic heart failure ([place])   . Atrial fibrillation with rapid ventricular response ([place]) 04/01/2020  . Atrial fibrillation with RVR ([place]) 04/01/2020  . Psoriasis 04/01/2020  . Constipation 04/01/2020  . Essential hypertension 04/01/2020  . Diabetes mellitus type II, non insulin dependent ([place]) 04/01/2020  . Dyspnea 04/01/2020    Current Outpatient Medications on File Prior to Visit  Medication Sig Dispense Refill  . apixaban (ELIQUIS) 5 MG TABS tablet Take 1 tablet (5 mg total) by mouth 2 (two) times daily. 90 tablet 1  . atorvastatin (LIPITOR) 80 MG tablet Take 80 mg by mouth at bedtime.     . Cholecalciferol (VITAMIN D3) 50 MCG (2000 UT) TABS Take 2,000 Units by mouth daily.    Marland Kitchen diltiazem (CARDIZEM CD) 120 MG 24 hr capsule Take 1 capsule (120 mg total) by mouth daily. 30 capsule 1  . furosemide (LASIX) 40 MG tablet Take 1 tablet (40 mg total) by mouth daily. 90 tablet 1  . lisinopril (PRINIVIL,ZESTRIL) 20 MG tablet Take 20 mg by mouth at bedtime.     . metFORMIN (GLUCOPHAGE) 500 MG tablet Take 500 mg by mouth 2 (two) times daily with a meal.    . metoprolol succinate (TOPROL-XL) 100 MG 24 hr tablet Take 1.5 tablets (150 mg total) by mouth daily. Take with or immediately following a meal. 45 tablet 6  . Multiple Vitamin (MULTIVITAMIN WITH MINERALS) TABS tablet Take 1 tablet by mouth daily.  Centrum    . Multiple Vitamins-Minerals (PRESERVISION AREDS 2) CAPS Take 1 capsule by mouth daily.     Marland Kitchen spironolactone (ALDACTONE) 25 MG tablet Take 0.5 tablets (12.5 mg total) by mouth daily. 30 tablet 3  . Ustekinumab (STELARA) 45 MG/0.5ML SOLN Inject 45 mg into the skin every 3 (three) months.      No current facility-administered medications on file prior to visit.    No Known Allergies  Objective: Physical Exam  General: Melissa Bishop is a pleasant 74 y.o.  Caucasian female, in NAD. AAO x 3.   Vascular:  Neurovascular status unchanged b/l lower extremities. Capillary fill time to digits <3 seconds b/l lower extremities. Palpable DP pulses b/l. Palpable PT pulses b/l. Pedal hair sparse b/l. Skin temperature gradient within normal limits b/l.  Dermatological:  Pedal skin with normal turgor, texture and tone bilaterally. No open wounds bilaterally. No interdigital macerations bilaterally. Toenails 1-5 b/l elongated, discolored, dystrophic, thickened, crumbly with subungual debris and tenderness to dorsal palpation. Hyperkeratotic lesion(s) L hallux and R hallux.  No erythema, no edema, no drainage, no flocculence.  Musculoskeletal:  Normal muscle strength 5/5 to all lower extremity muscle groups bilaterally. No pain crepitus or joint limitation noted with ROM b/l. No gross bony deformities bilaterally.  Neurological:  Protective sensation intact 5/5 intact bilaterally with 10g monofilament b/l. Vibratory sensation intact b/l.  Assessment and Plan:  1. Pain due to onychomycosis of toenails of both feet   2. Callus   3. Controlled  type 2 diabetes mellitus without complication, without long-term current use of insulin (HCC)    -Examined patient. -Toenails 1-5 b/l were debrided in length and girth with sterile nail nippers and dremel without iatrogenic bleeding.  -Callus(es) L hallux and R hallux pared utilizing sterile scalpel blade without complication or incident. Total number  debrided =2. -Patient to continue soft, supportive shoe gear daily. -Patient to report any pedal injuries to medical professional immediately. -Iatrogenic laceration sustained during L 2nd toe.  Treated with Lumicain Hemostatic Solution and alcohol. Patient instructed to apply Neosporin to L 2nd toe once daily for 7 days. -Patient/POA to call should there be question/concern in the interim.  Return in about 3 months (around 10/24/2020) for diabetic toenails, corn(s)/callus(es).  Marzetta Board, DPM

## 2020-08-03 ENCOUNTER — Other Ambulatory Visit (HOSPITAL_COMMUNITY): Payer: Self-pay | Admitting: Physician Assistant

## 2020-08-04 DIAGNOSIS — E785 Hyperlipidemia, unspecified: Secondary | ICD-10-CM | POA: Diagnosis not present

## 2020-08-04 DIAGNOSIS — I5022 Chronic systolic (congestive) heart failure: Secondary | ICD-10-CM | POA: Diagnosis not present

## 2020-08-04 DIAGNOSIS — D6869 Other thrombophilia: Secondary | ICD-10-CM | POA: Diagnosis not present

## 2020-08-04 DIAGNOSIS — R4189 Other symptoms and signs involving cognitive functions and awareness: Secondary | ICD-10-CM | POA: Diagnosis not present

## 2020-08-04 DIAGNOSIS — E1159 Type 2 diabetes mellitus with other circulatory complications: Secondary | ICD-10-CM | POA: Diagnosis not present

## 2020-08-04 DIAGNOSIS — I4821 Permanent atrial fibrillation: Secondary | ICD-10-CM | POA: Diagnosis not present

## 2020-08-04 DIAGNOSIS — E1142 Type 2 diabetes mellitus with diabetic polyneuropathy: Secondary | ICD-10-CM | POA: Diagnosis not present

## 2020-08-04 DIAGNOSIS — E1165 Type 2 diabetes mellitus with hyperglycemia: Secondary | ICD-10-CM | POA: Diagnosis not present

## 2020-08-04 DIAGNOSIS — E559 Vitamin D deficiency, unspecified: Secondary | ICD-10-CM | POA: Diagnosis not present

## 2020-08-04 DIAGNOSIS — Z9114 Patient's other noncompliance with medication regimen: Secondary | ICD-10-CM | POA: Diagnosis not present

## 2020-08-04 DIAGNOSIS — L409 Psoriasis, unspecified: Secondary | ICD-10-CM | POA: Diagnosis not present

## 2020-08-04 DIAGNOSIS — I10 Essential (primary) hypertension: Secondary | ICD-10-CM | POA: Diagnosis not present

## 2020-08-05 DIAGNOSIS — Z48812 Encounter for surgical aftercare following surgery on the circulatory system: Secondary | ICD-10-CM | POA: Diagnosis not present

## 2020-08-05 DIAGNOSIS — I447 Left bundle-branch block, unspecified: Secondary | ICD-10-CM | POA: Diagnosis not present

## 2020-08-05 DIAGNOSIS — I5023 Acute on chronic systolic (congestive) heart failure: Secondary | ICD-10-CM | POA: Diagnosis not present

## 2020-08-05 DIAGNOSIS — I110 Hypertensive heart disease with heart failure: Secondary | ICD-10-CM | POA: Diagnosis not present

## 2020-08-05 DIAGNOSIS — Z950 Presence of cardiac pacemaker: Secondary | ICD-10-CM | POA: Diagnosis not present

## 2020-08-05 DIAGNOSIS — I4891 Unspecified atrial fibrillation: Secondary | ICD-10-CM | POA: Diagnosis not present

## 2020-08-24 DIAGNOSIS — H35372 Puckering of macula, left eye: Secondary | ICD-10-CM | POA: Diagnosis not present

## 2020-08-24 DIAGNOSIS — E119 Type 2 diabetes mellitus without complications: Secondary | ICD-10-CM | POA: Diagnosis not present

## 2020-08-24 DIAGNOSIS — Z83518 Family history of other specified eye disorder: Secondary | ICD-10-CM | POA: Diagnosis not present

## 2020-08-24 DIAGNOSIS — H5315 Visual distortions of shape and size: Secondary | ICD-10-CM | POA: Diagnosis not present

## 2020-08-24 DIAGNOSIS — Z961 Presence of intraocular lens: Secondary | ICD-10-CM | POA: Diagnosis not present

## 2020-09-08 ENCOUNTER — Ambulatory Visit (INDEPENDENT_AMBULATORY_CARE_PROVIDER_SITE_OTHER): Payer: Medicare Other

## 2020-09-08 DIAGNOSIS — I4821 Permanent atrial fibrillation: Secondary | ICD-10-CM | POA: Diagnosis not present

## 2020-09-10 LAB — CUP PACEART REMOTE DEVICE CHECK
Battery Remaining Longevity: 48 mo
Battery Remaining Percentage: 93 %
Battery Voltage: 2.99 V
Date Time Interrogation Session: 20220128124810
HighPow Impedance: 90 Ohm
Implantable Lead Implant Date: 20211025
Implantable Lead Implant Date: 20211025
Implantable Lead Location: 753858
Implantable Lead Location: 753860
Implantable Pulse Generator Implant Date: 20211025
Lead Channel Impedance Value: 450 Ohm
Lead Channel Impedance Value: 910 Ohm
Lead Channel Pacing Threshold Amplitude: 0.5 V
Lead Channel Pacing Threshold Amplitude: 0.75 V
Lead Channel Pacing Threshold Pulse Width: 0.5 ms
Lead Channel Pacing Threshold Pulse Width: 0.5 ms
Lead Channel Sensing Intrinsic Amplitude: 12 mV
Lead Channel Setting Pacing Amplitude: 3.5 V
Lead Channel Setting Pacing Amplitude: 5 V
Lead Channel Setting Pacing Pulse Width: 0.5 ms
Lead Channel Setting Pacing Pulse Width: 0.5 ms
Lead Channel Setting Sensing Sensitivity: 0.5 mV
Pulse Gen Serial Number: 810006964

## 2020-09-12 ENCOUNTER — Ambulatory Visit (INDEPENDENT_AMBULATORY_CARE_PROVIDER_SITE_OTHER): Payer: Medicare Other | Admitting: Cardiology

## 2020-09-12 ENCOUNTER — Encounter: Payer: Self-pay | Admitting: Cardiology

## 2020-09-12 ENCOUNTER — Other Ambulatory Visit: Payer: Self-pay

## 2020-09-12 VITALS — BP 106/72 | HR 90 | Ht 71.0 in | Wt 214.0 lb

## 2020-09-12 DIAGNOSIS — I429 Cardiomyopathy, unspecified: Secondary | ICD-10-CM | POA: Diagnosis not present

## 2020-09-12 DIAGNOSIS — I4821 Permanent atrial fibrillation: Secondary | ICD-10-CM | POA: Diagnosis not present

## 2020-09-12 DIAGNOSIS — Z95810 Presence of automatic (implantable) cardiac defibrillator: Secondary | ICD-10-CM | POA: Diagnosis not present

## 2020-09-12 DIAGNOSIS — I5022 Chronic systolic (congestive) heart failure: Secondary | ICD-10-CM | POA: Diagnosis not present

## 2020-09-12 NOTE — Patient Instructions (Signed)
Medication Instructions:  Your physician recommends that you continue on your current medications as directed. Please refer to the Current Medication list given to you today.  *If you need a refill on your cardiac medications before your next appointment, please call your pharmacy*   Lab Work: None ordered.  If you have labs (blood work) drawn today and your tests are completely normal, you will receive your results only by: Marland Kitchen MyChart Message (if you have MyChart) OR . A paper copy in the mail If you have any lab test that is abnormal or we need to change your treatment, we will call you to review the results.   Testing/Procedures: Your physician has requested that you have an echocardiogram. Echocardiography is a painless test that uses sound waves to create images of your heart. It provides your doctor with information about the size and shape of your heart and how well your heart's chambers and valves are working. This procedure takes approximately one hour. There are no restrictions for this procedure.     Follow-Up: At [facility], you and your health needs are our priority.  As part of our continuing mission to provide you with exceptional heart care, we have created designated Provider Care Teams.  These Care Teams include your primary Cardiologist (physician) and Advanced Practice Providers (APPs -  Physician Assistants and Nurse Practitioners) who all work together to provide you with the care you need, when you need it.  We recommend signing up for the patient portal called "MyChart".  Sign up information is provided on this After Visit Summary.  MyChart is used to connect with patients for Virtual Visits (Telemedicine).  Patients are able to view lab/test results, encounter notes, upcoming appointments, etc.  Non-urgent messages can be sent to your provider as well.   To learn more about what you can do with MyChart, go to [email or web address].    Your next appointment:    6 month(s)  The format for your next appointment:   In Person  Provider:   Lars Mage, MD

## 2020-09-12 NOTE — Progress Notes (Signed)
Electrophysiology Office Follow up Visit Note:    Date:  09/12/2020   ID:  Melissa Bishop, DOB 05/06/1946, MRN [id number]  PCP:  [name], [facility] [facility] Cardiologist:  Evalina Field, MD  [facility] Electrophysiologist:  Vickie Epley, MD    Interval History:    Melissa Bishop is a 74 y.o. female who presents for a follow up visit.  Melissa Bishop underwent BiV ICD implant on June 06, 2020 followed by AV nodal ablation on July 18, 2020.  She tells me that she has been doing very well since the AV nodal ablation.  Her breathing is much improved.  Her energy level has significantly improved.  She started to become more active and is lost about 6 pounds.  Her mother confirms that she is doing better.  No problem with the CRT-D implant site.     Past Medical History:  Diagnosis Date  . CHF (congestive heart failure) ([place])   . Coronary artery disease   . Diabetes mellitus without complication ([place])   . High cholesterol   . Hypertension   . Psoriasis     Past Surgical History:  Procedure Laterality Date  . AV NODE ABLATION N/A 07/18/2020   Procedure: AV NODE ABLATION;  Surgeon: Vickie Epley, MD;  Location: [place] CV LAB;  Service: Cardiovascular;  Laterality: N/A;  . BIV ICD INSERTION CRT-D N/A 06/06/2020   Procedure: BIV ICD INSERTION CRT-D;  Surgeon: Vickie Epley, MD;  Location: [place] CV LAB;  Service: Cardiovascular;  Laterality: N/A;  . BREAST EXCISIONAL BIOPSY Right ? more than 30 years  . BREAST SURGERY    . CARDIOVERSION N/A 04/04/2020   Procedure: CARDIOVERSION;  Surgeon: Jerline Pain, MD;  Location: [facility] ENDOSCOPY;  Service: Cardiovascular;  Laterality: N/A;  . CARDIOVERSION N/A 05/10/2020   Procedure: CARDIOVERSION;  Surgeon: [name], MD;  Location: [place];  Service: Cardiovascular;  Laterality: N/A;  . TEE WITHOUT CARDIOVERSION N/A 04/04/2020   Procedure: TRANSESOPHAGEAL ECHOCARDIOGRAM (TEE);  Surgeon: Jerline Pain,  MD;  Location: [facility] ENDOSCOPY;  Service: Cardiovascular;  Laterality: N/A;    Current Medications: Current Meds  Medication Sig  . apixaban (ELIQUIS) 5 MG TABS tablet Take 1 tablet (5 mg total) by mouth 2 (two) times daily.  Marland Kitchen atorvastatin (LIPITOR) 80 MG tablet Take 80 mg by mouth at bedtime.   . Cholecalciferol (VITAMIN D3) 50 MCG (2000 UT) TABS Take 2,000 Units by mouth daily.  Marland Kitchen diltiazem (CARDIZEM CD) 120 MG 24 hr capsule TAKE 1 CAPSULE BY MOUTH EVERY DAY  . furosemide (LASIX) 40 MG tablet Take 1 tablet (40 mg total) by mouth daily.  Marland Kitchen lisinopril (PRINIVIL,ZESTRIL) 20 MG tablet Take 20 mg by mouth at bedtime.   . metFORMIN (GLUCOPHAGE) 500 MG tablet Take 500 mg by mouth 2 (two) times daily with a meal.  . metoprolol succinate (TOPROL-XL) 100 MG 24 hr tablet Take 1.5 tablets (150 mg total) by mouth daily. Take with or immediately following a meal.  . Multiple Vitamin (MULTIVITAMIN WITH MINERALS) TABS tablet Take 1 tablet by mouth daily. Centrum  . Multiple Vitamins-Minerals (PRESERVISION AREDS 2) CAPS Take 1 capsule by mouth daily.   Marland Kitchen spironolactone (ALDACTONE) 25 MG tablet Take 0.5 tablets (12.5 mg total) by mouth daily.  Marland Kitchen Ustekinumab 45 MG/0.5ML SOLN Inject 45 mg into the skin every 3 (three) months.      Allergies:   Patient has no known allergies.   Social History   Socioeconomic History  .  Marital status: Single    Spouse name: Not on file  . Number of children: Not on file  . Years of education: Not on file  . Highest education level: Not on file  Occupational History  . Not on file  Tobacco Use  . Smoking status: Former Research scientist (life sciences)  . Smokeless tobacco: Never Used  Vaping Use  . Vaping Use: Never used  Substance and Sexual Activity  . Alcohol use: No  . Drug use: No  . Sexual activity: Not on file  Other Topics Concern  . Not on file  Social History Narrative  . Not on file   Social Determinants of Health   Financial Resource Strain: Not on file  Food  Insecurity: Not on file  Transportation Needs: Not on file  Physical Activity: Not on file  Stress: Not on file  Social Connections: Not on file     Family History: The patient's family history includes Heart disease in her mother.  ROS:   Please see the history of present illness.    All other systems reviewed and are negative.  EKGs/Labs/Other Studies Reviewed:    The following studies were reviewed today:  September 12, 2020 device interrogation personally reviewed Longevity 6.5 to 7 years Presenting rhythm atrial fib with ventricular pacing During today's visit we turned on the rate responsiveness with a base rate of 80 bpm instead of 90 bpm.  RV capture threshold 0.5 V at 0.5 ms, sensing greater than 12 mV, lead impedance 510 ohms Left ventricular capture threshold 0.75 V at 0.5 ms, lead impedance 1175 ohms Turndown RV pulse amp given stability of lead Plan to decrease base rate to 70 bpm at her next follow-up appointment at the [facility] office with Dr. Audie Box  EKG:  The ekg ordered today demonstrates atrial fibrillation/flutter with biventricular pacing.  Recent Labs: 04/01/2020: Magnesium 2.0; TSH 1.866 04/05/2020: B Natriuretic Peptide 57.9 06/21/2020: BUN 21; Creatinine, Ser 0.77; Hemoglobin 13.1; Platelets 205; Potassium 4.5; Sodium 148  Recent Lipid Panel    Component Value Date/Time   CHOL 200 04/02/2020 0231   TRIG 104 04/02/2020 0231   HDL 37 (L) 04/02/2020 0231   CHOLHDL 5.4 04/02/2020 0231   VLDL 21 04/02/2020 0231   LDLCALC 142 (H) 04/02/2020 0231    Physical Exam:    VS:  BP 106/72   Pulse 90   Ht 5\' 11"  (1.803 m)   Wt 214 lb (97.1 kg)   SpO2 98%   BMI 29.85 kg/m     Wt Readings from Last 3 Encounters:  09/12/20 214 lb (97.1 kg)  07/18/20 223 lb (101.2 kg)  06/30/20 228 lb 12.8 oz (103.8 kg)     GEN: Well nourished, well developed in no acute distress HEENT: Normal NECK: No JVD; No carotid bruits LYMPHATICS: No lymphadenopathy CARDIAC: RRR,  no murmurs, rubs, gallops.  Incision is well-healed. RESPIRATORY:  Clear to auscultation without rales, wheezing or rhonchi  ABDOMEN: Soft, non-tender, non-distended MUSCULOSKELETAL:  No edema; No deformity  SKIN: Warm and dry NEUROLOGIC:  Alert and oriented x 3 PSYCHIATRIC:  Normal affect   ASSESSMENT:    1. Permanent atrial fibrillation ([place])   2. ICD (implantable cardioverter-defibrillator) in place   3. Chronic systolic heart failure (HCC)   4. Cardiomyopathy, unspecified type ([place])    PLAN:    In order of problems listed above:  1. Permanent atrial fibrillation Post CRT-D implant and AV nodal ablation Doing well after AV nodal ablation Turned down base rate to 80  bpm today, plan to decrease further to 70 bpm at her follow-up appointment with Dr. Audie Box next month. Continue Eliquis 5 mg twice daily for stroke prophylaxis  2.  Chronic systolic heart failure Likely secondary to uncontrolled ventricular rates during atrial fibrillation Continue current medical therapy and plan to repeat echocardiogram 3 months after AV nodal ablation.   Medication Adjustments/Labs and Tests Ordered: Current medicines are reviewed at length with the patient today.  Concerns regarding medicines are outlined above.  Orders Placed This Encounter  Procedures  . EKG 12-Lead  . ECHOCARDIOGRAM COMPLETE   No orders of the defined types were placed in this encounter.    Signed, Lars Mage, MD, [facility]  09/12/2020 4:22 PM    Electrophysiology [facility]

## 2020-09-19 NOTE — Progress Notes (Signed)
Remote ICD transmission.   

## 2020-09-22 DIAGNOSIS — Z79899 Other long term (current) drug therapy: Secondary | ICD-10-CM | POA: Diagnosis not present

## 2020-09-22 DIAGNOSIS — L400 Psoriasis vulgaris: Secondary | ICD-10-CM | POA: Diagnosis not present

## 2020-10-02 NOTE — Progress Notes (Signed)
Cardiology Office Note:   Date:  10/04/2020  NAME:  Melissa Bishop    MRN: [id number] DOB:  January 11, 1946   PCP:  [name], MD  Cardiologist:  Evalina Field, MD  Electrophysiologist:  Vickie Epley, MD   Referring MD: [name], MD   Chief Complaint  Patient presents with  . Congestive Heart Failure   History of Present Illness:   Melissa Bishop is a 75 y.o. female with a hx of systolic HF, permanent Afib s/p AVN ablation, DM, HLD who presents for follow-up.  She recently completed her AV node ablation.  She has a BiV ICD.  She also has CRT-D.  She has been doing well.  Weights are stable.  Blood pressure 122/82.  Plan to repeat echocardiogram early in March.  She is doing well on Lasix 40 mg a day.  No chest pain or shortness of breath.  She has some lower extremity edema that is improved with Lasix.  Most recent LDL cholesterol not at goal.  She is working on this.  Her diabetes number has improved to 6.5.  Her heart rate was turned down to 70 bpm per electrophysiology by pacemaker rep.  No issues with her device.  Tolerating Eliquis well.  No bleeding.  Problem List  1. Atrial fibrillation, persistent -TEE/DCCV 04/05/2020 failed -converted to NSR on amiodarone  -recurrence on amiodarone  -s/p AVN ablation 07/18/2020 2. Systolic HF, EF 92-42% -arrhythmia related  -CRT-D 05/2020 -s/p AVN ablation 07/18/2020 3. Diabetes -A1c 6.5 4. HLD -T chol 196, HDL 51, LDL 130, triglycerides 84  Past Medical History: Past Medical History:  Diagnosis Date  . CHF (congestive heart failure) ([place])   . Coronary artery disease   . Diabetes mellitus without complication ([place])   . High cholesterol   . Hypertension   . Psoriasis     Past Surgical History: Past Surgical History:  Procedure Laterality Date  . AV NODE ABLATION N/A 07/18/2020   Procedure: AV NODE ABLATION;  Surgeon: Vickie Epley, MD;  Location: [place] [facility];  Service: Cardiovascular;  Laterality: N/A;  .  BIV ICD INSERTION CRT-D N/A 06/06/2020   Procedure: BIV ICD INSERTION CRT-D;  Surgeon: Vickie Epley, MD;  Location: [place] [facility] LAB;  Service: Cardiovascular;  Laterality: N/A;  . BREAST EXCISIONAL BIOPSY Right ? more than 30 years  . BREAST SURGERY    . CARDIOVERSION N/A 04/04/2020   Procedure: CARDIOVERSION;  Surgeon: Jerline Pain, MD;  Location: [facility] ENDOSCOPY;  Service: Cardiovascular;  Laterality: N/A;  . CARDIOVERSION N/A 05/10/2020   Procedure: CARDIOVERSION;  Surgeon: [name], MD;  Location: [place];  Service: Cardiovascular;  Laterality: N/A;  . TEE WITHOUT CARDIOVERSION N/A 04/04/2020   Procedure: TRANSESOPHAGEAL ECHOCARDIOGRAM (TEE);  Surgeon: Jerline Pain, MD;  Location: [facility] ENDOSCOPY;  Service: Cardiovascular;  Laterality: N/A;    Current Medications: Current Meds  Medication Sig  . apixaban (ELIQUIS) 5 MG TABS tablet Take 1 tablet (5 mg total) by mouth 2 (two) times daily.  Marland Kitchen atorvastatin (LIPITOR) 80 MG tablet Take 80 mg by mouth at bedtime.   . Cholecalciferol (VITAMIN D3) 50 MCG (2000 UT) TABS Take 2,000 Units by mouth daily.  . furosemide (LASIX) 40 MG tablet Take 1 tablet (40 mg total) by mouth daily.  Marland Kitchen lisinopril (PRINIVIL,ZESTRIL) 20 MG tablet Take 20 mg by mouth at bedtime.   . metFORMIN (GLUCOPHAGE) 500 MG tablet Take 500 mg by mouth 2 (two) times daily with a meal.  .  metoprolol succinate (TOPROL-XL) 100 MG 24 hr tablet Take 1.5 tablets (150 mg total) by mouth daily. Take with or immediately following a meal.  . Multiple Vitamin (MULTIVITAMIN WITH MINERALS) TABS tablet Take 1 tablet by mouth daily. Centrum  . Multiple Vitamins-Minerals (PRESERVISION AREDS 2) CAPS Take 1 capsule by mouth daily.   Marland Kitchen spironolactone (ALDACTONE) 25 MG tablet Take 0.5 tablets (12.5 mg total) by mouth daily.  Marland Kitchen Ustekinumab 45 MG/0.5ML SOLN Inject 45 mg into the skin every 3 (three) months.   . [DISCONTINUED] dapagliflozin propanediol (FARXIGA) 10 MG TABS tablet Take 1  tablet (10 mg total) by mouth daily before breakfast.  . [DISCONTINUED] diltiazem (CARDIZEM CD) 120 MG 24 hr capsule TAKE 1 CAPSULE BY MOUTH EVERY DAY     Allergies:    Patient has no known allergies.   Social History: Social History   Socioeconomic History  . Marital status: Single    Spouse name: Not on file  . Number of children: Not on file  . Years of education: Not on file  . Highest education level: Not on file  Occupational History  . Not on file  Tobacco Use  . Smoking status: Former Research scientist (life sciences)  . Smokeless tobacco: Never Used  Vaping Use  . Vaping Use: Never used  Substance and Sexual Activity  . Alcohol use: No  . Drug use: No  . Sexual activity: Not on file  Other Topics Concern  . Not on file  Social History Narrative  . Not on file   Social Determinants of Health   Financial Resource Strain: Not on file  Food Insecurity: Not on file  Transportation Needs: Not on file  Physical Activity: Not on file  Stress: Not on file  Social Connections: Not on file     Family History: The patient's family history includes Heart disease in her mother.  ROS:   All other ROS reviewed and negative. Pertinent positives noted in the HPI.     EKGs/Labs/Other Studies Reviewed:   The following studies were personally reviewed by me today:  TTE 04/01/2020 1. Left ventricular ejection fraction, by estimation, is 25%. The left  ventricle has severely decreased function. Global hypokinesis. Left  ventricular endocardial border not optimally defined to evaluate regional  wall motion despite the use of Definity  contrast. Left ventricular diastolic parameters are indeterminate.  2. Right ventricular systolic function is severely reduced. The right  ventricular size is mildly enlarged. There is mildly elevated pulmonary  artery systolic pressure. The estimated right ventricular systolic  pressure is 67.8 mmHg.  3. Left atrial size was mildly dilated.  4. Right atrial size  was mildly dilated.  5. The mitral valve is grossly normal. No evidence of mitral valve  regurgitation.  6. The aortic valve is grossly normal. Aortic valve regurgitation is not  visualized.  7. The inferior vena cava is dilated in size with <50% respiratory  variability, suggesting right atrial pressure of 15 mmHg.   Recent Labs: 04/01/2020: Magnesium 2.0; TSH 1.866 04/05/2020: B Natriuretic Peptide 57.9 06/21/2020: BUN 21; Creatinine, Ser 0.77; Hemoglobin 13.1; Platelets 205; Potassium 4.5; Sodium 148   Recent Lipid Panel    Component Value Date/Time   CHOL 200 04/02/2020 0231   TRIG 104 04/02/2020 0231   HDL 37 (L) 04/02/2020 0231   CHOLHDL 5.4 04/02/2020 0231   VLDL 21 04/02/2020 0231   LDLCALC 142 (H) 04/02/2020 0231    Physical Exam:   VS:  BP 122/82   Pulse 84  Ht 5\' 11"  (1.803 m)   Wt 220 lb 9.6 oz (100.1 kg)   SpO2 99%   BMI 30.77 kg/m    Wt Readings from Last 3 Encounters:  10/04/20 220 lb 9.6 oz (100.1 kg)  09/12/20 214 lb (97.1 kg)  07/18/20 223 lb (101.2 kg)    General: Well nourished, well developed, in no acute distress Head: Atraumatic, normal size  Eyes: PEERLA, EOMI  Neck: Supple, no JVD Endocrine: No thryomegaly Cardiac: Normal S1, S2; RRR; no murmurs, rubs, or gallops Lungs: Clear to auscultation bilaterally, no wheezing, rhonchi or rales  Abd: Soft, nontender, no hepatomegaly  Ext: No edema, pulses 2+, trace edema Musculoskeletal: No deformities, BUE and BLE strength normal and equal Skin: Warm and dry, no rashes   Neuro: Alert and oriented to person, place, time, and situation, CNII-XII grossly intact, no focal deficits  Psych: Normal mood and affect   ASSESSMENT:   Melissa Bishop is a 75 y.o. female who presents for the following: 1. Permanent atrial fibrillation ([place])   2. Adequate anticoagulation on anticoagulant therapy   3. Chronic systolic heart failure ([place])   4. Mixed hyperlipidemia     PLAN:   1. Permanent atrial fibrillation  ([place]) 2. Adequate anticoagulation on anticoagulant therapy -Diagnosed with congestive heart failure in August 2021.  This was in the setting of A. fib with RVR.  She is had rather difficult to control heart rates.  She is had to undergo pacemaker implantation with AV node ablation as she failed amiodarone. -She is to be doing well.  Pacemaker turned down to 70 bpm.  Denies any symptoms of chest pain or shortness of breath.  No palpitations. -She will continue Eliquis.  No bleeding issues.  3. Chronic systolic heart failure (HCC) -Ejection fraction 25% in the setting of A. fib with RVR in August 2021.  She has failed rhythm control strategy with amiodarone.  She is undergone AV node ablation with Dr. Quentin Ore.  She has a BiV ICD in place. -Rates turndown to 70 bpm the office. -Euvolemic on examination. -She will continue metoprolol succinate 150 mg daily.  I have stopped her diltiazem she no longer needs this medication.  She will continue lisinopril 20 mg daily.  She is Aldactone 12.5 mg daily.  We will add Farxiga 10 mg daily. -The presumptive etiology for systolic heart failure is A. fib with RVR.  Plans repeat echocardiogram early next month.  If her ejection fraction has improved we will continue this medical therapy if not we will plan to switch her to [facility] and to further escalate her guideline directed medical therapy.  4. Mixed hyperlipidemia -Most recent LDL cholesterol 130.  She is diabetic.  She will continue Lipitor 80 mg a day.  Disposition: Return in about 3 months (around 01/01/2021).  Medication Adjustments/Labs and Tests Ordered: Current medicines are reviewed at length with the patient today.  Concerns regarding medicines are outlined above.  No orders of the defined types were placed in this encounter.  Meds ordered this encounter  Medications  . DISCONTD: dapagliflozin propanediol (FARXIGA) 10 MG TABS tablet    Sig: Take 1 tablet (10 mg total) by mouth daily before  breakfast.    Dispense:  90 tablet    Refill:  1    Patient Instructions  Medication Instructions:  Start Farxiga 10 mg one tablet daily (this was sent to your pharmacy) Stop Diltiazem   *If you need a refill on your cardiac medications before your next appointment, please  call your pharmacy*   Follow-Up: At [facility], you and your health needs are our priority.  As part of our continuing mission to provide you with exceptional heart care, we have created designated Provider Care Teams.  These Care Teams include your primary Cardiologist (physician) and Advanced Practice Providers (APPs -  Physician Assistants and Nurse Practitioners) who all work together to provide you with the care you need, when you need it.  We recommend signing up for the patient portal called "MyChart".  Sign up information is provided on this After Visit Summary.  MyChart is used to connect with patients for Virtual Visits (Telemedicine).  Patients are able to view lab/test results, encounter notes, upcoming appointments, etc.  Non-urgent messages can be sent to your provider as well.   To learn more about what you can do with MyChart, go to [email or web address].    Your next appointment:   3 month(s)  The format for your next appointment:   In Person  Provider:   Eleonore Chiquito, MD          Time Spent with Patient: I have spent a total of 25 minutes with patient reviewing hospital notes, telemetry, EKGs, labs and examining the patient as well as establishing an assessment and plan that was discussed with the patient.  > 50% of time was spent in direct patient care.  Signed, Addison Naegeli. Audie Box, [place]  [address] [place], [place] [address] [phone]  10/04/2020 4:23 PM

## 2020-10-04 ENCOUNTER — Ambulatory Visit (INDEPENDENT_AMBULATORY_CARE_PROVIDER_SITE_OTHER): Payer: Medicare Other | Admitting: Cardiovascular Disease

## 2020-10-04 ENCOUNTER — Encounter: Payer: Self-pay | Admitting: Cardiovascular Disease

## 2020-10-04 ENCOUNTER — Other Ambulatory Visit: Payer: Self-pay | Admitting: Cardiovascular Disease

## 2020-10-04 ENCOUNTER — Other Ambulatory Visit: Payer: Self-pay

## 2020-10-04 VITALS — BP 122/82 | HR 84 | Ht 71.0 in | Wt 220.6 lb

## 2020-10-04 DIAGNOSIS — Z7901 Long term (current) use of anticoagulants: Secondary | ICD-10-CM

## 2020-10-04 DIAGNOSIS — E782 Mixed hyperlipidemia: Secondary | ICD-10-CM

## 2020-10-04 DIAGNOSIS — I5022 Chronic systolic (congestive) heart failure: Secondary | ICD-10-CM | POA: Diagnosis not present

## 2020-10-04 DIAGNOSIS — I4821 Permanent atrial fibrillation: Secondary | ICD-10-CM

## 2020-10-04 MED ORDER — DAPAGLIFLOZIN PROPANEDIOL 10 MG PO TABS: 10.0000 mg | ORAL_TABLET | Freq: Every day | ORAL | 1 refills | Status: DC

## 2020-10-04 NOTE — Patient Instructions (Addendum)
Medication Instructions:  Start Farxiga 10 mg one tablet daily (this was sent to your pharmacy) Stop Diltiazem   *If you need a refill on your cardiac medications before your next appointment, please call your pharmacy*   Follow-Up: At [facility], you and your health needs are our priority.  As part of our continuing mission to provide you with exceptional heart care, we have created designated Provider Care Teams.  These Care Teams include your primary Cardiologist (physician) and Advanced Practice Providers (APPs -  Physician Assistants and Nurse Practitioners) who all work together to provide you with the care you need, when you need it.  We recommend signing up for the patient portal called "MyChart".  Sign up information is provided on this After Visit Summary.  MyChart is used to connect with patients for Virtual Visits (Telemedicine).  Patients are able to view lab/test results, encounter notes, upcoming appointments, etc.  Non-urgent messages can be sent to your provider as well.   To learn more about what you can do with MyChart, go to [email or web address].    Your next appointment:   3 month(s)  The format for your next appointment:   In Person  Provider:   Eleonore Chiquito, MD

## 2020-10-06 ENCOUNTER — Other Ambulatory Visit: Payer: Self-pay | Admitting: Cardiovascular Disease

## 2020-10-14 ENCOUNTER — Telehealth: Payer: Self-pay | Admitting: Cardiovascular Disease

## 2020-10-14 NOTE — Telephone Encounter (Signed)
Pt c/o medication issue:  1. Name of Medication: FARXIGA 10 MG TABS tablet  2. How are you currently taking this medication (dosage and times per day)? 1 tablet daily  3. Are you having a reaction (difficulty breathing--STAT)? no  4. What is your medication issue? Patient states she is completely out of the medication and that [facility]. She states the medication is very expensive and [facility] won't do anything about it.

## 2020-10-14 NOTE — Telephone Encounter (Signed)
Attempted to call the patient. Was unable to leave a message.

## 2020-10-18 NOTE — Telephone Encounter (Signed)
Spoke with pt, she has been out of farziga for several days now and has been unable to get from the pharmacy but not sure why. According to the pharmacy, the drug is non-formulary. Patient aware samples are at the front desk for pick up and AZ&ME application included for patient assistance. Will forward to primary nurse, julie, to look out for faxes from the pharmacy regarding this.

## 2020-10-18 NOTE — Telephone Encounter (Signed)
Sent PA via covermymeds. Awaiting response.  Will recheck.

## 2020-10-21 ENCOUNTER — Ambulatory Visit (HOSPITAL_COMMUNITY): Payer: Medicare Other | Attending: Internal Medicine

## 2020-10-21 ENCOUNTER — Other Ambulatory Visit: Payer: Self-pay

## 2020-10-21 DIAGNOSIS — I517 Cardiomegaly: Secondary | ICD-10-CM | POA: Diagnosis not present

## 2020-10-21 DIAGNOSIS — I429 Cardiomyopathy, unspecified: Secondary | ICD-10-CM | POA: Diagnosis not present

## 2020-10-21 LAB — ECHOCARDIOGRAM COMPLETE
Area-P 1/2: 3.99 cm2
S' Lateral: 4.8 cm

## 2020-10-21 MED ORDER — PERFLUTREN LIPID MICROSPHERE
1.0000 mL | INTRAVENOUS | Status: AC | PRN
  Administered 2020-10-21: 1 mL via INTRAVENOUS

## 2020-10-24 NOTE — Telephone Encounter (Signed)
Melissa Bishop, Can you help me with this?  I attempted the PA back on March 8th- and received a response that the member belongs to a health insurance plan that [employer] does not review. It says to call the number on the back of the card, is there anyway you can help me.  If you need to look at the cover my meds information  Key: [id number]  Last name: loisel  DOB: 01-27-1946  Thank you!

## 2020-10-26 ENCOUNTER — Ambulatory Visit (INDEPENDENT_AMBULATORY_CARE_PROVIDER_SITE_OTHER): Payer: Medicare Other | Admitting: Podiatry

## 2020-10-26 ENCOUNTER — Encounter (HOSPITAL_COMMUNITY): Payer: Self-pay | Admitting: Emergency Medicine

## 2020-10-26 ENCOUNTER — Other Ambulatory Visit: Payer: Self-pay

## 2020-10-26 ENCOUNTER — Emergency Department (HOSPITAL_COMMUNITY): Payer: Medicare Other

## 2020-10-26 ENCOUNTER — Encounter: Payer: Self-pay | Admitting: Podiatry

## 2020-10-26 ENCOUNTER — Emergency Department (HOSPITAL_COMMUNITY)
Admission: EM | Admit: 2020-10-26 | Discharge: 2020-10-26 | Disposition: A | Discharge status: Home / Self Care | Payer: Medicare Other | Attending: Emergency Medicine | Admitting: Emergency Medicine

## 2020-10-26 DIAGNOSIS — M79674 Pain in right toe(s): Secondary | ICD-10-CM

## 2020-10-26 DIAGNOSIS — J387 Other diseases of larynx: Secondary | ICD-10-CM | POA: Diagnosis not present

## 2020-10-26 DIAGNOSIS — Z79899 Other long term (current) drug therapy: Secondary | ICD-10-CM | POA: Insufficient documentation

## 2020-10-26 DIAGNOSIS — Z7901 Long term (current) use of anticoagulants: Secondary | ICD-10-CM | POA: Insufficient documentation

## 2020-10-26 DIAGNOSIS — B351 Tinea unguium: Secondary | ICD-10-CM | POA: Diagnosis not present

## 2020-10-26 DIAGNOSIS — T17208A Unspecified foreign body in pharynx causing other injury, initial encounter: Secondary | ICD-10-CM | POA: Diagnosis not present

## 2020-10-26 DIAGNOSIS — X58XXXA Exposure to other specified factors, initial encounter: Secondary | ICD-10-CM | POA: Insufficient documentation

## 2020-10-26 DIAGNOSIS — I672 Cerebral atherosclerosis: Secondary | ICD-10-CM | POA: Diagnosis not present

## 2020-10-26 DIAGNOSIS — I110 Hypertensive heart disease with heart failure: Secondary | ICD-10-CM | POA: Insufficient documentation

## 2020-10-26 DIAGNOSIS — I2510 Atherosclerotic heart disease of native coronary artery without angina pectoris: Secondary | ICD-10-CM | POA: Insufficient documentation

## 2020-10-26 DIAGNOSIS — R0602 Shortness of breath: Secondary | ICD-10-CM | POA: Diagnosis not present

## 2020-10-26 DIAGNOSIS — Z20822 Contact with and (suspected) exposure to covid-19: Secondary | ICD-10-CM | POA: Diagnosis not present

## 2020-10-26 DIAGNOSIS — E041 Nontoxic single thyroid nodule: Secondary | ICD-10-CM | POA: Diagnosis not present

## 2020-10-26 DIAGNOSIS — I517 Cardiomegaly: Secondary | ICD-10-CM | POA: Diagnosis not present

## 2020-10-26 DIAGNOSIS — L853 Xerosis cutis: Secondary | ICD-10-CM | POA: Diagnosis not present

## 2020-10-26 DIAGNOSIS — I10 Essential (primary) hypertension: Secondary | ICD-10-CM | POA: Diagnosis not present

## 2020-10-26 DIAGNOSIS — L84 Corns and callosities: Secondary | ICD-10-CM

## 2020-10-26 DIAGNOSIS — Z87891 Personal history of nicotine dependence: Secondary | ICD-10-CM | POA: Insufficient documentation

## 2020-10-26 DIAGNOSIS — E119 Type 2 diabetes mellitus without complications: Secondary | ICD-10-CM | POA: Insufficient documentation

## 2020-10-26 DIAGNOSIS — M79675 Pain in left toe(s): Secondary | ICD-10-CM | POA: Diagnosis not present

## 2020-10-26 DIAGNOSIS — Z7984 Long term (current) use of oral hypoglycemic drugs: Secondary | ICD-10-CM | POA: Diagnosis not present

## 2020-10-26 DIAGNOSIS — R0989 Other specified symptoms and signs involving the circulatory and respiratory systems: Secondary | ICD-10-CM

## 2020-10-26 DIAGNOSIS — S90229A Contusion of unspecified lesser toe(s) with damage to nail, initial encounter: Secondary | ICD-10-CM

## 2020-10-26 DIAGNOSIS — I6522 Occlusion and stenosis of left carotid artery: Secondary | ICD-10-CM | POA: Diagnosis not present

## 2020-10-26 DIAGNOSIS — I5021 Acute systolic (congestive) heart failure: Secondary | ICD-10-CM | POA: Insufficient documentation

## 2020-10-26 DIAGNOSIS — R198 Other specified symptoms and signs involving the digestive system and abdomen: Secondary | ICD-10-CM

## 2020-10-26 LAB — CBC WITH DIFFERENTIAL/PLATELET
Abs Immature Granulocytes: 0.01 10*3/uL (ref 0.00–0.07)
Basophils Absolute: 0 10*3/uL (ref 0.0–0.1)
Basophils Relative: 1 %
Eosinophils Absolute: 0.1 10*3/uL (ref 0.0–0.5)
Eosinophils Relative: 1 %
HCT: 47.6 % — ABNORMAL HIGH (ref 36.0–46.0)
Hemoglobin: 15.6 g/dL — ABNORMAL HIGH (ref 12.0–15.0)
Immature Granulocytes: 0 %
Lymphocytes Relative: 30 %
Lymphs Abs: 1.6 10*3/uL (ref 0.7–4.0)
MCH: 28.9 pg (ref 26.0–34.0)
MCHC: 32.8 g/dL (ref 30.0–36.0)
MCV: 88.3 fL (ref 80.0–100.0)
Monocytes Absolute: 0.3 10*3/uL (ref 0.1–1.0)
Monocytes Relative: 6 %
Neutro Abs: 3.4 10*3/uL (ref 1.7–7.7)
Neutrophils Relative %: 62 %
Platelets: 231 10*3/uL (ref 150–400)
RBC: 5.39 MIL/uL — ABNORMAL HIGH (ref 3.87–5.11)
RDW: 14.4 % (ref 11.5–15.5)
WBC: 5.4 10*3/uL (ref 4.0–10.5)
nRBC: 0 % (ref 0.0–0.2)

## 2020-10-26 LAB — BASIC METABOLIC PANEL
Anion gap: 8 (ref 5–15)
BUN: 19 mg/dL (ref 8–23)
CO2: 28 mmol/L (ref 22–32)
Calcium: 9.8 mg/dL (ref 8.9–10.3)
Chloride: 103 mmol/L (ref 98–111)
Creatinine, Ser: 0.94 mg/dL (ref 0.44–1.00)
GFR, Estimated: >60 mL/min (ref >60)
Glucose, Bld: 119 mg/dL — ABNORMAL HIGH (ref 70–99)
Potassium: 4.2 mmol/L (ref 3.5–5.1)
Sodium: 139 mmol/L (ref 135–145)

## 2020-10-26 MED ORDER — ALUM & MAG HYDROXIDE-SIMETH 200-200-20 MG/5ML PO SUSP
30.0000 mL | Freq: Once | ORAL | Status: AC
  Administered 2020-10-26: 30 mL via ORAL
  Filled 2020-10-26: qty 30

## 2020-10-26 MED ORDER — STERILE WATER FOR INJECTION IJ SOLN
INTRAMUSCULAR | Status: AC
  Filled 2020-10-26: qty 10

## 2020-10-26 MED ORDER — METOCLOPRAMIDE HCL 5 MG/ML IJ SOLN
10.0000 mg | Freq: Once | INTRAMUSCULAR | Status: AC
  Administered 2020-10-26: 10 mg via INTRAVENOUS
  Filled 2020-10-26: qty 2

## 2020-10-26 MED ORDER — GLUCAGON HCL RDNA (DIAGNOSTIC) 1 MG IJ SOLR
1.0000 mg | Freq: Once | INTRAMUSCULAR | Status: AC
  Administered 2020-10-26: 1 mg via INTRAVENOUS
  Filled 2020-10-26: qty 1

## 2020-10-26 MED ORDER — LIDOCAINE VISCOUS HCL 2 % MT SOLN
15.0000 mL | Freq: Once | OROMUCOSAL | Status: AC
  Administered 2020-10-26: 15 mL via ORAL
  Filled 2020-10-26: qty 15

## 2020-10-26 MED ORDER — DIPHENHYDRAMINE HCL 50 MG/ML IJ SOLN
25.0000 mg | Freq: Once | INTRAMUSCULAR | Status: AC
  Administered 2020-10-26: 25 mg via INTRAVENOUS
  Filled 2020-10-26: qty 1

## 2020-10-26 NOTE — ED Provider Notes (Signed)
[place] EMERGENCY DEPARTMENT Provider Note   CSN: [id number] Arrival date & time: 10/26/20  1820     History Chief Complaint  Patient presents with  . Swallowed Foreign Body    Melissa Bishop is a 75 y.o. female history CAD, diabetes, hypertension, A. fib on Eliquis here presenting with possible foreign body in the throat.  Patient states that she was at [employer] and she had a rib let with a bone in it.  She states that she took a bite and felt that the bone was stuck in her throat.  She states that she has severe pain afterwards.  Patient did not try to drink anything after which  The history is provided by the patient.       Past Medical History:  Diagnosis Date  . CHF (congestive heart failure) ([place])   . Coronary artery disease   . Diabetes mellitus without complication ([place])   . High cholesterol   . Hypertension   . Psoriasis     Patient Active Problem List   Diagnosis Date Noted  . Secondary hypercoagulable state ([place]) 06/16/2020  . Atrial fibrillation ([place]) 06/06/2020  . Persistent atrial fibrillation ([place])   . Acute systolic heart failure ([place])   . Atrial fibrillation with rapid ventricular response ([place]) 04/01/2020  . Atrial fibrillation with RVR ([place]) 04/01/2020  . Psoriasis 04/01/2020  . Constipation 04/01/2020  . Essential hypertension 04/01/2020  . Diabetes mellitus type II, non insulin dependent ([place]) 04/01/2020  . Dyspnea 04/01/2020    Past Surgical History:  Procedure Laterality Date  . AV NODE ABLATION N/A 07/18/2020   Procedure: AV NODE ABLATION;  Surgeon: Vickie Epley, MD;  Location: [place] CV LAB;  Service: Cardiovascular;  Laterality: N/A;  . BIV ICD INSERTION CRT-D N/A 06/06/2020   Procedure: BIV ICD INSERTION CRT-D;  Surgeon: Vickie Epley, MD;  Location: [place] CV LAB;  Service: Cardiovascular;  Laterality: N/A;  . BREAST EXCISIONAL BIOPSY Right ? more than 30 years  . BREAST SURGERY    .  CARDIOVERSION N/A 04/04/2020   Procedure: CARDIOVERSION;  Surgeon: Jerline Pain, MD;  Location: [facility] ENDOSCOPY;  Service: Cardiovascular;  Laterality: N/A;  . CARDIOVERSION N/A 05/10/2020   Procedure: CARDIOVERSION;  Surgeon: [name], MD;  Location: [place];  Service: Cardiovascular;  Laterality: N/A;  . TEE WITHOUT CARDIOVERSION N/A 04/04/2020   Procedure: TRANSESOPHAGEAL ECHOCARDIOGRAM (TEE);  Surgeon: Jerline Pain, MD;  Location: [facility] ENDOSCOPY;  Service: Cardiovascular;  Laterality: N/A;     OB History   No obstetric history on file.     Family History  Problem Relation Age of Onset  . Heart disease Mother        Required pacemaker in her 102s    Social History   Tobacco Use  . Smoking status: Former Research scientist (life sciences)  . Smokeless tobacco: Never Used  Vaping Use  . Vaping Use: Never used  Substance Use Topics  . Alcohol use: No  . Drug use: No    Home Medications Prior to Admission medications   Medication Sig Start Date End Date Taking? Authorizing Provider  apixaban (ELIQUIS) 5 MG TABS tablet Take 1 tablet (5 mg total) by mouth 2 (two) times daily. 04/22/20   O'NealCassie Freer, MD  atorvastatin (LIPITOR) 80 MG tablet Take 80 mg by mouth at bedtime.  12/24/19   [provider]  Cholecalciferol (VITAMIN D3) 50 MCG (2000 UT) TABS Take 2,000 Units by mouth daily.    [provider]  FARXIGA 10 MG TABS tablet TAKE 1 TABLET BY MOUTH EVERY DAY BEFORE BREAKFAST 10/10/20   O'Neal, Cassie Freer, MD  furosemide (LASIX) 40 MG tablet Take 1 tablet (40 mg total) by mouth daily. 05/12/20   O'NealCassie Freer, MD  lisinopril (PRINIVIL,ZESTRIL) 20 MG tablet Take 20 mg by mouth at bedtime.     [provider]  metFORMIN (GLUCOPHAGE) 500 MG tablet Take 500 mg by mouth 2 (two) times daily with a meal.    [provider]  metoprolol succinate (TOPROL-XL) 100 MG 24 hr tablet Take 1.5 tablets (150 mg total) by mouth daily. Take with or immediately  following a meal. 06/08/20   Baldwin Jamaica, PA-C  Multiple Vitamin (MULTIVITAMIN WITH MINERALS) TABS tablet Take 1 tablet by mouth daily. Centrum    [provider]  Multiple Vitamins-Minerals (PRESERVISION AREDS 2) CAPS Take 1 capsule by mouth daily.     [provider]  spironolactone (ALDACTONE) 25 MG tablet Take 0.5 tablets (12.5 mg total) by mouth daily. 04/22/20   O'NealCassie Freer, MD  STELARA 45 MG/0.5ML SOSY injection Inject into the skin. 06/15/20   [provider]  Ustekinumab 45 MG/0.5ML SOLN Inject 45 mg into the skin every 3 (three) months.     [provider]    Allergies    Patient has no known allergies.  Review of Systems   Review of Systems  HENT: Positive for sore throat.   All other systems reviewed and are negative.   Physical Exam Updated Vital Signs BP (!) 130/94   Pulse 73   Temp 97.8 F (36.6 C) (Oral)   Resp 18   SpO2 99%   Physical Exam Vitals and nursing note reviewed.  HENT:     Head: Normocephalic.     Nose: Nose normal.     Mouth/Throat:     Comments: No obvious trismus and unable to visualize any obvious foreign body Eyes:     Extraocular Movements: Extraocular movements intact.     Pupils: Pupils are equal, round, and reactive to light.  Cardiovascular:     Rate and Rhythm: Normal rate and regular rhythm.     Pulses: Normal pulses.     Heart sounds: Normal heart sounds.  Pulmonary:     Effort: Pulmonary effort is normal.     Breath sounds: Normal breath sounds.  Abdominal:     General: Abdomen is flat.     Palpations: Abdomen is soft.  Musculoskeletal:        General: Normal range of motion.     Cervical back: Normal range of motion and neck supple.  Skin:    General: Skin is warm.     Capillary Refill: Capillary refill takes less than 2 seconds.  Neurological:     General: No focal deficit present.     Mental Status: She is alert.  Psychiatric:        Mood and Affect: Mood normal.      ED Results / Procedures / Treatments   Labs (all labs ordered are listed, but only abnormal results are displayed) Labs Reviewed  CBC WITH DIFFERENTIAL/PLATELET - Abnormal; Notable for the following components:      Result Value   RBC 5.39 (*)    Hemoglobin 15.6 (*)    HCT 47.6 (*)    All other components within normal limits  RESP PANEL BY RT-PCR (FLU A&B, COVID) ARPGX2  BASIC METABOLIC PANEL    EKG None  Radiology  DG Chest 2 View  Result Date: 10/26/2020 CLINICAL DATA:  Shortness of breath. EXAM: CHEST - 2 VIEW COMPARISON:  None. FINDINGS: There is a dual lead AICD. Mild, chronic appearing increased lung markings are seen. There is no evidence of acute infiltrate, pleural effusion or pneumothorax. The cardiac silhouette is markedly enlarged. Degenerative changes seen throughout the thoracic spine. IMPRESSION: Cardiomegaly without evidence of acute or active cardiopulmonary disease. Electronically Signed   By: Virgina Norfolk M.D.   On: 10/26/2020 19:14    Procedures Procedures   Medications Ordered in ED Medications  sterile water (preservative free) injection (has no administration in time range)  glucagon (human recombinant) (GLUCAGEN) injection 1 mg (1 mg Intravenous Given 10/26/20 2040)  metoCLOPramide (REGLAN) injection 10 mg (10 mg Intravenous Given 10/26/20 2039)  diphenhydrAMINE (BENADRYL) injection 25 mg (25 mg Intravenous Given 10/26/20 2037)    ED Course  I have reviewed the triage vital signs and the nursing notes.  Pertinent labs & imaging results that were available during my care of the patient were reviewed by me and considered in my medical decision making (see chart for details).    MDM Rules/Calculators/A&P                         Melissa Bishop is a 75 y.o. female here presenting with possible foreign body in the throat.  She states that it may be a bone was in the throat.  Will get a CT neck to further assess.  It is possible she also has globus  sensation so we will try glucagon and Reglan Benadryl.  10:57 PM Labs are unremarkable.  CT did not show any radio opaque foreign body in the neck and chest.   Final Clinical Impression(s) / ED Diagnoses Final diagnoses:  None    Rx / DC Orders ED Discharge Orders    None       Drenda Freeze, MD 10/26/20 2258

## 2020-10-26 NOTE — ED Triage Notes (Signed)
Patient BIB [facility] from an [employer] after choking on her food. Patient vomited after choking but still feels like something is stuck in her throat. Patient is not dyspneic, is alert, and in no apparent distress at this time.

## 2020-10-26 NOTE — Telephone Encounter (Signed)
**Note De-Identified Kevontae Burgoon Obfuscation** Wilder Glade PA started through covermymeds: Key: Hoy Register

## 2020-10-26 NOTE — Patient Instructions (Signed)
For normal skin: Moisturize feet once daily; do not apply between toes A.  CeraVe Daily Moisturizing Lotion B.  Vaseline Intensive Care Lotion C.  Lubriderm Lotion D.  Gold Bond Diabetic Foot Lotion E.  Eucerin Intensive Repair Moisturizing Lotion  For extremely dry, cracked feet: moisturize feet once daily; do not apply between toes A. CeraVe Healing Ointment B. Aquaphor Healing Ointment C. Vaseline Petroleum Healing Jelly   If you have problems reaching your feet: apply to feet once daily; do not apply between toes A.  Aquaphor Advanced Therapy Ointment Body Spray B.  Vaseline Intensive Care Spray Lotion Advanced Repair    

## 2020-10-26 NOTE — Discharge Instructions (Signed)
You do not have any bones in your throat right now.   Stay hydrated.  Soft diet for several days  See your doctor for follow  Return to ER if you have severe pain in your throat, vomiting, unable to keep food down

## 2020-10-26 NOTE — ED Notes (Signed)
Patient transported to CT 

## 2020-10-27 ENCOUNTER — Telehealth: Payer: Self-pay | Admitting: Emergency Medicine

## 2020-10-27 ENCOUNTER — Other Ambulatory Visit: Payer: Self-pay | Admitting: Cardiovascular Disease

## 2020-10-27 ENCOUNTER — Other Ambulatory Visit (HOSPITAL_COMMUNITY): Payer: Medicare Other

## 2020-10-27 LAB — RESP PANEL BY RT-PCR (FLU A&B, COVID) ARPGX2
Influenza A by PCR: NEGATIVE
Influenza B by PCR: NEGATIVE
SARS Coronavirus 2 by RT PCR: NEGATIVE

## 2020-10-27 NOTE — Telephone Encounter (Signed)
Patient to come in to have lower rate decreased to 80 bpm per CL. Ensure outputs set at chronic settings. Appointment scheduled.

## 2020-10-28 ENCOUNTER — Other Ambulatory Visit (HOSPITAL_COMMUNITY): Payer: Medicare Other

## 2020-10-28 NOTE — Telephone Encounter (Signed)
**Note De-Identified Melissa Bishop Obfuscation** Per covermymeds message concerning this Melissa Glade PA:  Cherrill Sheffer (Key: [id number])  This request has not been approved. [employer]  AFB will send a letter to the member and you regarding this decision and the right to appeal.  Awaiting letter from [facility] explaining why PA was denied.

## 2020-10-31 DIAGNOSIS — H35372 Puckering of macula, left eye: Secondary | ICD-10-CM | POA: Diagnosis not present

## 2020-10-31 DIAGNOSIS — Z961 Presence of intraocular lens: Secondary | ICD-10-CM | POA: Diagnosis not present

## 2020-10-31 DIAGNOSIS — E119 Type 2 diabetes mellitus without complications: Secondary | ICD-10-CM | POA: Diagnosis not present

## 2020-10-31 DIAGNOSIS — H18593 Other hereditary corneal dystrophies, bilateral: Secondary | ICD-10-CM | POA: Diagnosis not present

## 2020-11-02 NOTE — Telephone Encounter (Signed)
Denial letter never received so I called the # on the back of the pts [employer] card and s/w Melissa Bishop   at [employer]. Per Melissa Bishop this PA has been denied because Melissa Bishop is the preferred medication on the pts plan and that the pt must first try and fail Jardiance prior to considering coverage of Farxiga.  Forwarding this phone note to Dr Audie Box and his nurse for advisement to the pt.

## 2020-11-02 NOTE — Telephone Encounter (Signed)
Melissa Bishop:  I think we need to switch her to jardiance.   [place] T. Audie Box, MD, [place]  [address] [place], [place] [address] [phone]  8:18 PM

## 2020-11-03 ENCOUNTER — Other Ambulatory Visit: Payer: Self-pay

## 2020-11-03 ENCOUNTER — Ambulatory Visit (INDEPENDENT_AMBULATORY_CARE_PROVIDER_SITE_OTHER): Payer: Medicare Other | Admitting: Emergency Medicine

## 2020-11-03 DIAGNOSIS — I4891 Unspecified atrial fibrillation: Secondary | ICD-10-CM

## 2020-11-03 DIAGNOSIS — I5021 Acute systolic (congestive) heart failure: Secondary | ICD-10-CM | POA: Diagnosis not present

## 2020-11-03 LAB — CUP PACEART INCLINIC DEVICE CHECK
Battery Remaining Longevity: 88 mo
Brady Statistic RA Percent Paced: 0 %
Brady Statistic RV Percent Paced: 99 %
Date Time Interrogation Session: 20220324110339
HighPow Impedance: 91.125
Implantable Lead Implant Date: 20211025
Implantable Lead Implant Date: 20211025
Implantable Lead Location: 753858
Implantable Lead Location: 753860
Implantable Pulse Generator Implant Date: 20211025
Lead Channel Impedance Value: 1325 Ohm
Lead Channel Impedance Value: 525 Ohm
Lead Channel Pacing Threshold Amplitude: 0.5 V
Lead Channel Pacing Threshold Amplitude: 0.5 V
Lead Channel Pacing Threshold Amplitude: 0.5 V
Lead Channel Pacing Threshold Amplitude: 0.5 V
Lead Channel Pacing Threshold Pulse Width: 0.5 ms
Lead Channel Pacing Threshold Pulse Width: 0.5 ms
Lead Channel Pacing Threshold Pulse Width: 0.5 ms
Lead Channel Pacing Threshold Pulse Width: 0.5 ms
Lead Channel Sensing Intrinsic Amplitude: 12 mV
Lead Channel Setting Pacing Amplitude: 2.5 V
Lead Channel Setting Pacing Amplitude: 2.5 V
Lead Channel Setting Pacing Pulse Width: 0.5 ms
Lead Channel Setting Pacing Pulse Width: 0.5 ms
Lead Channel Setting Sensing Sensitivity: 0.5 mV
Pulse Gen Serial Number: 810006964

## 2020-11-03 NOTE — Progress Notes (Signed)
Subjective: Melissa Bishop is a pleasant 75 y.o. female patient seen today preventative diabetic foot care and painful callus(es) b/l great toes and painful mycotic toenails b/l that are difficult to trim. Pain interferes with ambulation. Aggravating factors include wearing enclosed shoe gear. Pain is relieved with periodic professional debridement.   She notes dark discoloration of right 2nd digit toenail. She denies any episodes of trauma. Digit is not painful.  No Known Allergies  Objective: Physical Exam  General: Melissa Bishop is a pleasant 75 y.o.  Caucasian female, in NAD. AAO x 3.   Vascular:  Neurovascular status unchanged b/l lower extremities. Capillary fill time to digits <3 seconds b/l lower extremities. Palpable DP pulses b/l. Palpable PT pulses b/l. Pedal hair sparse b/l. Skin temperature gradient within normal limits b/l.  Dermatological:  Pedal skin with normal turgor, texture and tone bilaterally. No open wounds bilaterally. No interdigital macerations bilaterally. Toenails 1-5 b/l elongated, discolored, dystrophic, thickened, crumbly with subungual debris and tenderness to dorsal palpation. There is evidence of subacute subungual hematoma of the L 2nd toe and R 2nd toe. Nailplate remains adhered. There is no  tenderness to palpation. Hyperkeratotic lesion(s) L hallux and R hallux.  No erythema, no edema, no drainage, no fluctuance. Pedal skin noted to be dry and flaky left foot and right foot.  Musculoskeletal:  Normal muscle strength 5/5 to all lower extremity muscle groups bilaterally. No pain crepitus or joint limitation noted with ROM b/l. No gross bony deformities bilaterally.  Neurological:  Protective sensation intact 5/5 intact bilaterally with 10g monofilament b/l. Vibratory sensation intact b/l.  Assessment and Plan:  1. Pain due to onychomycosis of toenails of both feet   2. Callus   3. Subungual hematoma of toe, unspecified laterality, initial encounter   4.  Xerosis cutis   5. Controlled type 2 diabetes mellitus without complication, without long-term current use of insulin ([place])    -Examined patient. -Subungual hematomas stable b/l 2nd toes. Monitor. -Toenails 1-5 b/l were debrided in length and girth with sterile nail nippers and dremel without iatrogenic bleeding.  -Callus(es) L hallux and R hallux pared utilizing sterile scalpel blade without complication or incident. Total number debrided =2. -Iatrogenic laceration sustained during L hallux. Treated with Lumicain Hemostatic Solution and alcohol. No further treatment required by patient. -Patient to report any pedal injuries to medical professional immediately. -For dry skin, patient was given written list of OTC moisturizers. Patient/POA instructed to apply to foot/feet once daily avoiding application between toes.  -Patient/POA to call should there be a concern in the interim.  Return in about 3 months (around 01/26/2021).  Marzetta Board, DPM

## 2020-11-03 NOTE — Progress Notes (Signed)
CRT-D device check in office. Thresholds and sensing consistent with previous device measurements. Lead impedance trends stable over time. No ventricular arrhythmia episodes recorded. Patient bi-ventricularly pacing 99% of the time. Device programmed with appropriate safety margins. Heart failure diagnostics reviewed and trends are stable for patient.   Patient in today to adjust LRL to 70 bpm following AVN ablation.  Upon interrogation, device already programmed LRL of 70bpm.  Reviewed with RU, PA in office.  If pt feeling ok, can leave LRL at 70 until MD visit.  No changes made this session. Estimated longevity 7.4 years.  Patient enrolled in remote follow up, next scheduled check 11/28/20.  Patient education completed including shock plan.

## 2020-11-04 ENCOUNTER — Other Ambulatory Visit: Payer: Self-pay

## 2020-11-04 MED ORDER — EMPAGLIFLOZIN 10 MG PO TABS: 10.0000 mg | ORAL_TABLET | Freq: Every day | ORAL | 0 refills | Status: DC

## 2020-11-04 NOTE — Telephone Encounter (Signed)
I called and spoke with patient, and sister- advised that we had to try jardiance, they are okay with this. Updated med list, and sent to pharmacy.

## 2020-12-08 ENCOUNTER — Ambulatory Visit (INDEPENDENT_AMBULATORY_CARE_PROVIDER_SITE_OTHER): Payer: Medicare Other

## 2020-12-08 DIAGNOSIS — I4821 Permanent atrial fibrillation: Secondary | ICD-10-CM | POA: Diagnosis not present

## 2020-12-10 ENCOUNTER — Other Ambulatory Visit: Payer: Self-pay | Admitting: Cardiovascular Disease

## 2020-12-12 NOTE — Telephone Encounter (Signed)
I cannot sign for some reason.  Almyra Free -> Can you help?  [place] Bells T. Audie Box, MD, [place]  [address], [place], [place] [address] [phone]  3:39 PM

## 2020-12-13 ENCOUNTER — Other Ambulatory Visit: Payer: Self-pay | Admitting: Physician Assistant

## 2020-12-14 LAB — CUP PACEART REMOTE DEVICE CHECK
Battery Remaining Longevity: 88 mo
Battery Remaining Percentage: 90 %
Battery Voltage: 3.01 V
Date Time Interrogation Session: 20220428030634
HighPow Impedance: 86 Ohm
Implantable Lead Implant Date: 20211025
Implantable Lead Implant Date: 20211025
Implantable Lead Location: 753858
Implantable Lead Location: 753860
Implantable Pulse Generator Implant Date: 20211025
Lead Channel Impedance Value: 1275 Ohm
Lead Channel Impedance Value: 450 Ohm
Lead Channel Pacing Threshold Amplitude: 0.5 V
Lead Channel Pacing Threshold Amplitude: 0.5 V
Lead Channel Pacing Threshold Pulse Width: 0.5 ms
Lead Channel Pacing Threshold Pulse Width: 0.5 ms
Lead Channel Sensing Intrinsic Amplitude: 12 mV
Lead Channel Setting Pacing Amplitude: 2.5 V
Lead Channel Setting Pacing Amplitude: 2.5 V
Lead Channel Setting Pacing Pulse Width: 0.5 ms
Lead Channel Setting Pacing Pulse Width: 0.5 ms
Lead Channel Setting Sensing Sensitivity: 0.5 mV
Pulse Gen Serial Number: 810006964

## 2020-12-20 DIAGNOSIS — Z79899 Other long term (current) drug therapy: Secondary | ICD-10-CM | POA: Diagnosis not present

## 2020-12-20 DIAGNOSIS — L400 Psoriasis vulgaris: Secondary | ICD-10-CM | POA: Diagnosis not present

## 2020-12-20 DIAGNOSIS — L821 Other seborrheic keratosis: Secondary | ICD-10-CM | POA: Diagnosis not present

## 2020-12-27 DIAGNOSIS — Z23 Encounter for immunization: Secondary | ICD-10-CM | POA: Diagnosis not present

## 2020-12-29 NOTE — Progress Notes (Signed)
Remote ICD transmission.   

## 2021-01-02 ENCOUNTER — Other Ambulatory Visit: Payer: Self-pay | Admitting: Family Medicine

## 2021-01-02 DIAGNOSIS — Z1231 Encounter for screening mammogram for malignant neoplasm of breast: Secondary | ICD-10-CM

## 2021-01-02 NOTE — Progress Notes (Signed)
Cardiology Office Note:   Date:  01/04/2021  NAME:  Melissa Bishop    MRN: [id number] DOB:  [birth year]   PCP:  [name], MD  Cardiologist:  Evalina Field, MD  Electrophysiologist:  Vickie Epley, MD   Referring MD: [name], MD   Chief Complaint  Patient presents with  . Congestive Heart Failure   History of Present Illness:   Melissa Bishop is a 75 y.o. female with a hx of DM, systolic HF, Afib s/p AVN ablation, BiV ICD who presents for follow-up. EF has not recovered. Needs further titration of GDMT.  She reports she is doing well.  BP 138/82.  Her EF has not recovered.  We discussed switching to [facility] for further titration of guideline directed medical therapy.  She is interested.  She will stop lisinopril and start Entresto on Friday.  She denies any significant lower extremity edema.  Rates are much improved after AV node ablation.  She seems to be doing well.  She is without complaints in office.  Needs refills on Jardiance.  Her pacemaker site is clean and dry.  No bruising or bleeding.  She says she feels well.  She is doing remarkably better than when I first met her in the hospital last year.  Problem List 1. Atrial fibrillation, persistent -TEE/DCCV 04/05/2020 failed -converted to NSR on amiodarone -recurrence on amiodarone -s/p AVN ablation 07/18/2020 2. Systolic HF -EF 16-96% 7/89/3810 -EF 25-30% 10/21/2020 -arrhythmia related -CRT-D 05/2020 -s/p AVN ablation 07/18/2020 3. Diabetes -A1c 6.5 4. HLD -T chol 196, HDL 51, LDL 130, triglycerides 84  Past Medical History: Past Medical History:  Diagnosis Date  . CHF (congestive heart failure) ([place])   . Coronary artery disease   . Diabetes mellitus without complication ([place])   . High cholesterol   . Hypertension   . Psoriasis     Past Surgical History: Past Surgical History:  Procedure Laterality Date  . AV NODE ABLATION N/A 07/18/2020   Procedure: AV NODE ABLATION;  Surgeon: Vickie Epley, MD;  Location: [place] CV LAB;  Service: Cardiovascular;  Laterality: N/A;  . BIV ICD INSERTION CRT-D N/A 06/06/2020   Procedure: BIV ICD INSERTION CRT-D;  Surgeon: Vickie Epley, MD;  Location: [place] CV LAB;  Service: Cardiovascular;  Laterality: N/A;  . BREAST EXCISIONAL BIOPSY Right ? more than 30 years  . BREAST SURGERY    . CARDIOVERSION N/A 04/04/2020   Procedure: CARDIOVERSION;  Surgeon: Jerline Pain, MD;  Location: [facility] ENDOSCOPY;  Service: Cardiovascular;  Laterality: N/A;  . CARDIOVERSION N/A 05/10/2020   Procedure: CARDIOVERSION;  Surgeon: [name], MD;  Location: [place];  Service: Cardiovascular;  Laterality: N/A;  . TEE WITHOUT CARDIOVERSION N/A 04/04/2020   Procedure: TRANSESOPHAGEAL ECHOCARDIOGRAM (TEE);  Surgeon: Jerline Pain, MD;  Location: [facility] ENDOSCOPY;  Service: Cardiovascular;  Laterality: N/A;    Current Medications: Current Meds  Medication Sig  . apixaban (ELIQUIS) 5 MG TABS tablet Take 1 tablet (5 mg total) by mouth 2 (two) times daily.  Marland Kitchen atorvastatin (LIPITOR) 80 MG tablet Take 80 mg by mouth at bedtime.   . Cholecalciferol (VITAMIN D3) 50 MCG (2000 UT) TABS Take 2,000 Units by mouth daily.  . furosemide (LASIX) 40 MG tablet Take 1 tablet (40 mg total) by mouth daily.  . metFORMIN (GLUCOPHAGE) 500 MG tablet Take 500 mg by mouth 2 (two) times daily with a meal.  . metoprolol succinate (TOPROL-XL) 100 MG 24 hr tablet TAKE 1.5 TABLETS (  150 MG TOTAL) BY MOUTH DAILY. TAKE WITH OR IMMEDIATELY FOLLOWING A MEAL.  . Multiple Vitamin (MULTIVITAMIN WITH MINERALS) TABS tablet Take 1 tablet by mouth daily. Centrum  . Multiple Vitamins-Minerals (PRESERVISION AREDS 2) CAPS Take 1 capsule by mouth daily.   Marland Kitchen spironolactone (ALDACTONE) 25 MG tablet TAKE 1/2 TABLET BY MOUTH EVERY DAY  . STELARA 45 MG/0.5ML SOSY injection Inject into the skin.  Marland Kitchen Ustekinumab 45 MG/0.5ML SOLN Inject 45 mg into the skin every 3 (three) months.   . [DISCONTINUED]  JARDIANCE 10 MG TABS tablet TAKE 1 TABLET BY MOUTH DAILY BEFORE BREAKFAST.  . [DISCONTINUED] lisinopril (PRINIVIL,ZESTRIL) 20 MG tablet Take 20 mg by mouth at bedtime.      Allergies:    Patient has no known allergies.   Social History: Social History   Socioeconomic History  . Marital status: Single    Spouse name: Not on file  . Number of children: Not on file  . Years of education: Not on file  . Highest education level: Not on file  Occupational History  . Not on file  Tobacco Use  . Smoking status: Former Research scientist (life sciences)  . Smokeless tobacco: Never Used  Vaping Use  . Vaping Use: Never used  Substance and Sexual Activity  . Alcohol use: No  . Drug use: No  . Sexual activity: Not on file  Other Topics Concern  . Not on file  Social History Narrative  . Not on file   Social Determinants of Health   Financial Resource Strain: Not on file  Food Insecurity: Not on file  Transportation Needs: Not on file  Physical Activity: Not on file  Stress: Not on file  Social Connections: Not on file     Family History: The patient's family history includes Heart disease in her mother.  ROS:   All other ROS reviewed and negative. Pertinent positives noted in the HPI.     EKGs/Labs/Other Studies Reviewed:   The following studies were personally reviewed by me today:  TTE 10/21/2020 1. Left ventricular ejection fraction, by estimation, is 25 to 30%. The  left ventricle has severely decreased function. The left ventricle  demonstrates global hypokinesis. The left ventricular internal cavity size  was mildly dilated. There is mild  concentric left ventricular hypertrophy. Left ventricular diastolic  parameters are indeterminate.  2. Right ventricular systolic function is moderately to severely reduced.  The right ventricular size is moderately enlarged. There is normal  pulmonary artery systolic pressure.  3. The mitral valve is normal in structure. No evidence of mitral valve   regurgitation. No evidence of mitral stenosis.  4. The aortic valve is grossly normal. Aortic valve regurgitation is  trivial. No aortic stenosis is present.   Recent Labs: 04/01/2020: Magnesium 2.0; TSH 1.866 04/05/2020: B Natriuretic Peptide 57.9 10/26/2020: BUN 19; Creatinine, Ser 0.94; Hemoglobin 15.6; Platelets 231; Potassium 4.2; Sodium 139   Recent Lipid Panel    Component Value Date/Time   CHOL 200 04/02/2020 0231   TRIG 104 04/02/2020 0231   HDL 37 (L) 04/02/2020 0231   CHOLHDL 5.4 04/02/2020 0231   VLDL 21 04/02/2020 0231   LDLCALC 142 (H) 04/02/2020 0231    Physical Exam:   VS:  BP 138/82   Pulse (!) 59   Ht '5\' 11"'  (1.803 m)   Wt 219 lb 9.6 oz (99.6 kg)   SpO2 99%   BMI 30.63 kg/m    Wt Readings from Last 3 Encounters:  01/04/21 219 lb 9.6 oz (99.6  kg)  10/04/20 220 lb 9.6 oz (100.1 kg)  09/12/20 214 lb (97.1 kg)    General: Well nourished, well developed, in no acute distress Head: Atraumatic, normal size  Eyes: PEERLA, EOMI  Neck: Supple, no JVD Endocrine: No thryomegaly Cardiac: Normal S1, S2; RRR; no murmurs, rubs, or gallops Lungs: Clear to auscultation bilaterally, no wheezing, rhonchi or rales  Abd: Soft, nontender, no hepatomegaly  Ext: No edema, pulses 2+ Musculoskeletal: No deformities, BUE and BLE strength normal and equal Skin: Warm and dry, no rashes   Neuro: Alert and oriented to person, place, time, and situation, CNII-XII grossly intact, no focal deficits  Psych: Normal mood and affect   ASSESSMENT:   Gennie Eisinger is a 75 y.o. female who presents for the following: 1. Permanent atrial fibrillation ([place])   2. Chronic systolic heart failure ([place])   3. Mixed hyperlipidemia     PLAN:   1. Permanent atrial fibrillation ([place]) -She had rather difficult to control A. fib and failed amiodarone.  AV node ablation was pursued.  She now has a CRT-D system. -Seems to be doing well.  She will continue her Eliquis 5 mg twice daily.  No bleeding  issues.  2. Chronic systolic heart failure ([place]) -Diagnosed with congestive heart failure in August 2021.  EF 25% at that time.  It was thought to be due to A. fib.  Her EF has not improved despite AV node ablation with CRT-D. -She describes no symptoms of angina.  She has never pursued a stress test.  I highly doubt she has obstructive CAD. -Her bigger issue was her atrial fibrillation. -I think she needs to have medical therapy optimized.  We will continue her metoprolol succinate 150 mg daily.  We will stop her lisinopril today.  She will start Entresto 24-26 mg twice daily on Friday.  We will plan to further titrate this up with pharmacy in 2 weeks.  She will also continue Aldactone 12.5 mg daily.  She is on Jardiance 10 mg daily. -She appears euvolemic.  I will continue Lasix 40 mg daily.  3. Mixed hyperlipidemia -She is diabetic.  We have her on Lipitor 80.  She will need repeat labs at some point.  Disposition: Return in about 3 months (around 04/06/2021).  Medication Adjustments/Labs and Tests Ordered: Current medicines are reviewed at length with the patient today.  Concerns regarding medicines are outlined above.  No orders of the defined types were placed in this encounter.  Meds ordered this encounter  Medications  . empagliflozin (JARDIANCE) 10 MG TABS tablet    Sig: Take 1 tablet (10 mg total) by mouth daily before breakfast.    Dispense:  30 tablet    Refill:  2    Patient Instructions  Medication Instructions:  STOP LISINOPRIL (do not take anymore after today 01/04/2021) Start Entresto 24/26 on Friday 01/06/2021- use the samples for 2 weeks that we have provided for you until you see PharmD in 2 weeks for titration.   *If you need a refill on your cardiac medications before your next appointment, please call your pharmacy*   Follow-Up: At [facility], you and your health needs are our priority.  As part of our continuing mission to provide you with exceptional  heart care, we have created designated Provider Care Teams.  These Care Teams include your primary Cardiologist (physician) and Advanced Practice Providers (APPs -  Physician Assistants and Nurse Practitioners) who all work together to provide you with the care you need,  when you need it.  We recommend signing up for the patient portal called "MyChart".  Sign up information is provided on this After Visit Summary.  MyChart is used to connect with patients for Virtual Visits (Telemedicine).  Patients are able to view lab/test results, encounter notes, upcoming appointments, etc.  Non-urgent messages can be sent to your provider as well.   To learn more about what you can do with MyChart, go to [email or web address].    Your next appointment:   3 month(s)   The format for your next appointment:   In Person  Provider:   You will see one of the following Advanced Practice Providers on your designated Care Team:    Almyra Deforest, PA-C  [place], [place]    **you will see [facility] 06/13 at 1:30 PM**    Time Spent with Patient: I have spent a total of 35 minutes with patient reviewing hospital notes, telemetry, EKGs, labs and examining the patient as well as establishing an assessment and plan that was discussed with the patient.  > 50% of time was spent in direct patient care.  Signed, Addison Naegeli. Audie Box, MD, [place]  [address] [place], [place] [address] [phone]  01/04/2021 4:53 PM

## 2021-01-04 ENCOUNTER — Encounter: Payer: Self-pay | Admitting: Cardiovascular Disease

## 2021-01-04 ENCOUNTER — Ambulatory Visit (INDEPENDENT_AMBULATORY_CARE_PROVIDER_SITE_OTHER): Payer: Medicare Other | Admitting: Cardiovascular Disease

## 2021-01-04 ENCOUNTER — Other Ambulatory Visit: Payer: Self-pay

## 2021-01-04 VITALS — BP 138/82 | HR 59 | Ht 71.0 in | Wt 219.6 lb

## 2021-01-04 DIAGNOSIS — I5022 Chronic systolic (congestive) heart failure: Secondary | ICD-10-CM

## 2021-01-04 DIAGNOSIS — E782 Mixed hyperlipidemia: Secondary | ICD-10-CM

## 2021-01-04 DIAGNOSIS — I4821 Permanent atrial fibrillation: Secondary | ICD-10-CM | POA: Diagnosis not present

## 2021-01-04 MED ORDER — EMPAGLIFLOZIN 10 MG PO TABS: 10.0000 mg | ORAL_TABLET | Freq: Every day | ORAL | 2 refills | Status: DC

## 2021-01-04 NOTE — Patient Instructions (Addendum)
Medication Instructions:  STOP LISINOPRIL (do not take anymore after today 01/04/2021) Start Entresto 24/26 on Friday 01/06/2021- use the samples for 2 weeks that we have provided for you until you see PharmD in 2 weeks for titration.   *If you need a refill on your cardiac medications before your next appointment, please call your pharmacy*   Follow-Up: At [facility], you and your health needs are our priority.  As part of our continuing mission to provide you with exceptional heart care, we have created designated Provider Care Teams.  These Care Teams include your primary Cardiologist (physician) and Advanced Practice Providers (APPs -  Physician Assistants and Nurse Practitioners) who all work together to provide you with the care you need, when you need it.  We recommend signing up for the patient portal called "MyChart".  Sign up information is provided on this After Visit Summary.  MyChart is used to connect with patients for Virtual Visits (Telemedicine).  Patients are able to view lab/test results, encounter notes, upcoming appointments, etc.  Non-urgent messages can be sent to your provider as well.   To learn more about what you can do with MyChart, go to [email or web address].    Your next appointment:   3 month(s)   The format for your next appointment:   In Person  Provider:   You will see one of the following Advanced Practice Providers on your designated Care Team:    Almyra Deforest, PA-C  [place], [place]    **you will see [facility] 06/13 at 1:30 PM**

## 2021-01-23 ENCOUNTER — Ambulatory Visit: Payer: Medicare Other

## 2021-01-23 NOTE — Progress Notes (Deleted)
Patient ID: Melissa Bishop                 DOB: [birth year]                      MRN: [id number]     HPI: Ladell Bey is a 75 y.o. female referred by Dr. Audie Box to pharmacy clinic for HF medication management. PMH is significant for DM, systolic HF, Afib s/p AVN ablation, BiV ICD. Most recent LVEF 25-30% on 10/21/20.  Today she presents to pharmacy clinic for further medication titration. At last visit with MD she was changed from lisinopril to [facility]. Symptomatically, she is feeling ***, *** dizziness, lightheadedness, and fatigue. *** chest pain or palpitations. Feels SOB when ***. Able to complete all ADLs. Activity level ***. She *** checks her weight at home (normal range *** - *** lbs). *** LEE, PND, or orthopnea. Appetite has been ***. She *** adheres to a low-salt diet.  Current CHF meds: Entresto 24-26mg  twice a day, Jardiance 10mg  daily, metoprolol succinate 150mg  daily, spirinolactone 12.5mg  daily, furosemide 40mg  daily Previously tried:  BP goal: <130/80  Family History: The patient's family history includes Heart disease in her mother  Social History: former smoker  Diet:   Exercise:   Home BP readings:   Wt Readings from Last 3 Encounters:  01/04/21 219 lb 9.6 oz (99.6 kg)  10/04/20 220 lb 9.6 oz (100.1 kg)  09/12/20 214 lb (97.1 kg)   BP Readings from Last 3 Encounters:  01/04/21 138/82  10/26/20 131/73  10/04/20 122/82   Pulse Readings from Last 3 Encounters:  01/04/21 (!) 59  10/26/20 73  10/04/20 84    Renal function: CrCl cannot be calculated (Patient's most recent lab result is older than the maximum 21 days allowed.).  Past Medical History:  Diagnosis Date   CHF (congestive heart failure) (HCC)    Coronary artery disease    Diabetes mellitus without complication (H[facility])    High cholesterol    Hypertension    Psoriasis     Current Outpatient Medications on File Prior to Visit  Medication Sig Dispense Refill   apixaban (ELIQUIS) 5 MG TABS tablet  Take 1 tablet (5 mg total) by mouth 2 (two) times daily. 90 tablet 1   atorvastatin (LIPITOR) 80 MG tablet Take 80 mg by mouth at bedtime.      Cholecalciferol (VITAMIN D3) 50 MCG (2000 UT) TABS Take 2,000 Units by mouth daily.     empagliflozin (JARDIANCE) 10 MG TABS tablet Take 1 tablet (10 mg total) by mouth daily before breakfast. 30 tablet 2   furosemide (LASIX) 40 MG tablet Take 1 tablet (40 mg total) by mouth daily. 90 tablet 1   metFORMIN (GLUCOPHAGE) 500 MG tablet Take 500 mg by mouth 2 (two) times daily with a meal.     metoprolol succinate (TOPROL-XL) 100 MG 24 hr tablet TAKE 1.5 TABLETS (150 MG TOTAL) BY MOUTH DAILY. TAKE WITH OR IMMEDIATELY FOLLOWING A MEAL. 135 tablet 3   Multiple Vitamin (MULTIVITAMIN WITH MINERALS) TABS tablet Take 1 tablet by mouth daily. Centrum     Multiple Vitamins-Minerals (PRESERVISION AREDS 2) CAPS Take 1 capsule by mouth daily.      spironolactone (ALDACTONE) 25 MG tablet TAKE 1/2 TABLET BY MOUTH EVERY DAY 45 tablet 3   STELARA 45 MG/0.5ML SOSY injection Inject into the skin.     Ustekinumab 45 MG/0.5ML SOLN Inject 45 mg into the skin every 3 (three) months.  No current facility-administered medications on file prior to visit.    No Known Allergies   Assessment/Plan:  1. CHF -

## 2021-01-23 NOTE — Progress Notes (Deleted)
01/23/2021 Melissa Bishop [birth year] [id number]   HPI:  Melissa Bishop is a 75 y.o. female patient of Dr Audie Box, with a PMH below who presents today for heart failure medication titration.  She was seen by Dr. Audie Box about 2  weeks ago at which time he switched lisinopril to [facility] after appropriate hold.  She is already on Jardiance, metoprolol and spironolactone.   Her most recent echocardiogram was in August of last year, at which time her EF was noted to be 25-30%.    Past Medical History: Atrial fibrillation CHADS2-VASc score 6 (chf, htn, age x 2, dm, fem) - on Eliquis 5 mg bid, metoprolol succ 150 mg qd  DM2 8/21 A1c 7.5 - on metformin, empagliflozin  hyperlipidemia 8/21 LDL 142 - now on atorvastatin 80 mg qd  hypertension Last 138/82, will control with HF medications     Blood Pressure Goal:  130/80  Current Medications: Entresto 24/26 bid, metoprolol succ 150 mg qd, spironolactone 12.5 mg qd, empagliflozin 10 mg qd  Family Hx:  Social Hx:   Diet:   Exercise:   Home BP readings:   Intolerances:   Labs:    Wt Readings from Last 3 Encounters:  01/04/21 219 lb 9.6 oz (99.6 kg)  10/04/20 220 lb 9.6 oz (100.1 kg)  09/12/20 214 lb (97.1 kg)   BP Readings from Last 3 Encounters:  01/04/21 138/82  10/26/20 131/73  10/04/20 122/82   Pulse Readings from Last 3 Encounters:  01/04/21 (!) 59  10/26/20 73  10/04/20 84    Current Outpatient Medications  Medication Sig Dispense Refill   apixaban (ELIQUIS) 5 MG TABS tablet Take 1 tablet (5 mg total) by mouth 2 (two) times daily. 90 tablet 1   atorvastatin (LIPITOR) 80 MG tablet Take 80 mg by mouth at bedtime.      Cholecalciferol (VITAMIN D3) 50 MCG (2000 UT) TABS Take 2,000 Units by mouth daily.     empagliflozin (JARDIANCE) 10 MG TABS tablet Take 1 tablet (10 mg total) by mouth daily before breakfast. 30 tablet 2   furosemide (LASIX) 40 MG tablet Take 1 tablet (40 mg total) by mouth daily. 90 tablet 1   metFORMIN  (GLUCOPHAGE) 500 MG tablet Take 500 mg by mouth 2 (two) times daily with a meal.     metoprolol succinate (TOPROL-XL) 100 MG 24 hr tablet TAKE 1.5 TABLETS (150 MG TOTAL) BY MOUTH DAILY. TAKE WITH OR IMMEDIATELY FOLLOWING A MEAL. 135 tablet 3   Multiple Vitamin (MULTIVITAMIN WITH MINERALS) TABS tablet Take 1 tablet by mouth daily. Centrum     Multiple Vitamins-Minerals (PRESERVISION AREDS 2) CAPS Take 1 capsule by mouth daily.      spironolactone (ALDACTONE) 25 MG tablet TAKE 1/2 TABLET BY MOUTH EVERY DAY 45 tablet 3   STELARA 45 MG/0.5ML SOSY injection Inject into the skin.     Ustekinumab 45 MG/0.5ML SOLN Inject 45 mg into the skin every 3 (three) months.      No current facility-administered medications for this visit.    No Known Allergies  Past Medical History:  Diagnosis Date   CHF (congestive heart failure) (HCC)    Coronary artery disease    Diabetes mellitus without complication (HCC)    High cholesterol    Hypertension    Psoriasis     There were no vitals taken for this visit.  No problem-specific Assessment & Plan notes found for this encounter.   Tommy Medal PharmD CPP [facility]  [address] [place], [place] [address] [phone]

## 2021-01-25 ENCOUNTER — Other Ambulatory Visit: Payer: Self-pay

## 2021-01-25 ENCOUNTER — Ambulatory Visit (INDEPENDENT_AMBULATORY_CARE_PROVIDER_SITE_OTHER): Payer: Medicare Other | Admitting: Pharmacist

## 2021-01-25 DIAGNOSIS — I5020 Unspecified systolic (congestive) heart failure: Secondary | ICD-10-CM | POA: Diagnosis not present

## 2021-01-25 DIAGNOSIS — D8489 Other immunodeficiencies: Secondary | ICD-10-CM | POA: Diagnosis not present

## 2021-01-25 DIAGNOSIS — Z872 Personal history of diseases of the skin and subcutaneous tissue: Secondary | ICD-10-CM | POA: Diagnosis not present

## 2021-01-25 MED ORDER — ENTRESTO 24-26 MG PO TABS: 1.0000 | ORAL_TABLET | Freq: Two times a day (BID) | ORAL | 0 refills | Status: DC

## 2021-01-25 NOTE — Progress Notes (Signed)
Patient ID: Hartley Wyke                 DOB: 14-Dec-1945                      MRN: [id number]     HPI: Marliyah Reid is a 75 y.o. female referred by Dr. Audie Box to pharmacist clinic for HF medication titration. PMH includes hypertension, HFrEF (EF 25-30% in 2021), permanent atrail fibrillation, mixed hyperlipidemia, and diabetes.   Patient presents to clinic unaccompanied , but file flagged with "patient has legal guardian". Mother and caregiver were contacted via cell phone, with patient authorization, while having lunch at [employer]. Both were able to listen to conversation during [place].   Patient reports problems making original follow up appointment and ran out of Entresto samples almost 2 weeks ago. Denies problems or side effect while on therapy, but I am unable to proceed with titration today. She still taking Jardiance 10mg  daily, Spironolactone, metoprolol, and furosemide as prescribed.    Current meds:  Entresto 24/26mg  twice daily - last dose >10 days ago Jardiance 10mg  daily Spironolactone 12.5mg  daily Metoprolol succinate 150mg  daily Furosemide 40mg  daily  BP goal: <130/80  Family History: heart disease in mother  Social History: former smoker, denies alcohol use  Exercise: activities of daily living  Home BP readings: none provided  Wt Readings from Last 3 Encounters:  01/25/21 218 lb 3.2 oz (99 kg)  01/04/21 219 lb 9.6 oz (99.6 kg)  10/04/20 220 lb 9.6 oz (100.1 kg)   BP Readings from Last 3 Encounters:  01/25/21 120/82  01/04/21 138/82  10/26/20 131/73   Pulse Readings from Last 3 Encounters:  01/25/21 76  01/04/21 (!) 59  10/26/20 73    Past Medical History:  Diagnosis Date   CHF (congestive heart failure) (HCC)    Coronary artery disease    Diabetes mellitus without complication (HCC)    High cholesterol    Hypertension    Psoriasis     Current Outpatient Medications on File Prior to Visit  Medication Sig Dispense Refill   apixaban (ELIQUIS) 5  MG TABS tablet Take 1 tablet (5 mg total) by mouth 2 (two) times daily. 90 tablet 1   atorvastatin (LIPITOR) 80 MG tablet Take 80 mg by mouth at bedtime.      Cholecalciferol (VITAMIN D3) 50 MCG (2000 UT) TABS Take 2,000 Units by mouth daily.     diltiazem (CARDIZEM) 120 MG tablet Take 120 mg by mouth daily.     empagliflozin (JARDIANCE) 10 MG TABS tablet Take 1 tablet (10 mg total) by mouth daily before breakfast. 30 tablet 2   furosemide (LASIX) 40 MG tablet Take 1 tablet (40 mg total) by mouth daily. 90 tablet 1   metFORMIN (GLUCOPHAGE) 500 MG tablet Take 500 mg by mouth 2 (two) times daily with a meal.     metoprolol succinate (TOPROL-XL) 100 MG 24 hr tablet TAKE 1.5 TABLETS (150 MG TOTAL) BY MOUTH DAILY. TAKE WITH OR IMMEDIATELY FOLLOWING A MEAL. 135 tablet 3   Multiple Vitamin (MULTIVITAMIN WITH MINERALS) TABS tablet Take 1 tablet by mouth daily. Centrum     Multiple Vitamins-Minerals (PRESERVISION AREDS 2) CAPS Take 1 capsule by mouth daily.      spironolactone (ALDACTONE) 25 MG tablet TAKE 1/2 TABLET BY MOUTH EVERY DAY 45 tablet 3   STELARA 45 MG/0.5ML SOSY injection Inject into the skin.     No current facility-administered medications on file prior to  visit.    No Known Allergies  Blood pressure 120/82, pulse 76, resp. rate 15, height 5\' 11"  (1.803 m), weight 218 lb 3.2 oz (99 kg), SpO2 98 %.  HFrEF (heart failure with reduced ejection fraction) (HCC) Blood pressure improved from previous OV, but patient ran out of Entresto > 10 days ago. I am unable to assess blood pressure and volume status while on Entresto. No need to repeat BMET today either. Patient denies problems while taking Entresto samples previously provided. Caregiver expressed "difficulty" bringing patient to an appointment in 3-4 weeks and wants to know why I "can not do everything needed today". I explained to patient, her mother, and caregiver, the need to titrate medication to an ideal dose for Ms Hoelzer, and need to  monitoring her kidney function and blood pressure during the process. I gave an estimate of 2-3 additional visits needed until we knew ideal dose of HF medication.  Entresto 24-26 twice daily resumed today. 30 day free card provided, so we can guarantee enough medication until next OV. 3 week follow up was also scheduled and plan to repeat BMEt during follow up as well.  Patient guardian status discussed with Education officer, museum. Profile has flag but no official court documentation available. Need to ask patient during next OV for guardian paperwork to add to her medical record.   Kristyana Notte Rodriguez-Guzman PharmD, BCPS, CPP [place] [phone] 01/30/2021 10:06 AM

## 2021-01-25 NOTE — Patient Instructions (Addendum)
Return for a  follow up appointment in [place] your blood pressure at home daily (if able) and keep record of the readings.  Take your BP meds as follows: *RESUME taking ENTRESTO 24/26mg  twice daily* *CONTINUE taking all other medication as prescribed*  Bring all of your meds, your BP cuff and your record of home blood pressures to your next appointment.  Exercise as you're able, try to walk approximately 30 minutes per day.  Keep salt intake to a minimum, especially watch canned and prepared boxed foods.  Eat more fresh fruits and vegetables and fewer canned items.  Avoid eating in fast food restaurants.    HOW TO TAKE YOUR BLOOD PRESSURE: Rest 5 minutes before taking your blood pressure.  Don't smoke or drink caffeinated beverages for at least 30 minutes before. Take your blood pressure before (not after) you eat. Sit comfortably with your back supported and both feet on the floor (don't cross your legs). Elevate your arm to heart level on a table or a desk. Use the proper sized cuff. It should fit smoothly and snugly around your bare upper arm. There should be enough room to slip a fingertip under the cuff. The bottom edge of the cuff should be 1 inch above the crease of the elbow. Ideally, take 3 measurements at one sitting and record the average.

## 2021-01-30 ENCOUNTER — Encounter: Payer: Self-pay | Admitting: Pharmacist

## 2021-01-30 DIAGNOSIS — I5020 Unspecified systolic (congestive) heart failure: Secondary | ICD-10-CM | POA: Insufficient documentation

## 2021-01-30 NOTE — Assessment & Plan Note (Addendum)
Blood pressure improved from previous OV, but patient ran out of Entresto > 10 days ago. I am unable to assess blood pressure and volume status while on Entresto. No need to repeat BMET today either. Patient denies problems while taking Entresto samples previously provided. Caregiver expressed "difficulty" bringing patient to an appointment in 3-4 weeks and wants to know why I "can not do everything needed today". I explained to patient, her mother, and caregiver, the need to titrate medication to an ideal dose for Ms Mckeel, and need to monitoring her kidney function and blood pressure during the process. I gave an estimate of 2-3 additional visits needed until we knew ideal dose of HF medication.  Entresto 24-26 twice daily resumed today. 30 day free card provided, so we can guarantee enough medication until next OV. 3 week follow up was also scheduled and plan to repeat BMEt during follow up as well.  Patient guardian status discussed with Education officer, museum. Profile has flag but no official court documentation available. Need to ask patient during next OV for guardian paperwork to add to her medical record.

## 2021-02-08 ENCOUNTER — Ambulatory Visit (INDEPENDENT_AMBULATORY_CARE_PROVIDER_SITE_OTHER): Payer: Medicare Other | Admitting: Podiatry

## 2021-02-08 ENCOUNTER — Other Ambulatory Visit: Payer: Self-pay

## 2021-02-08 ENCOUNTER — Encounter: Payer: Self-pay | Admitting: Podiatry

## 2021-02-08 DIAGNOSIS — M79675 Pain in left toe(s): Secondary | ICD-10-CM

## 2021-02-08 DIAGNOSIS — L84 Corns and callosities: Secondary | ICD-10-CM

## 2021-02-08 DIAGNOSIS — M79674 Pain in right toe(s): Secondary | ICD-10-CM

## 2021-02-08 DIAGNOSIS — M810 Age-related osteoporosis without current pathological fracture: Secondary | ICD-10-CM | POA: Insufficient documentation

## 2021-02-08 DIAGNOSIS — L853 Xerosis cutis: Secondary | ICD-10-CM

## 2021-02-08 DIAGNOSIS — E1142 Type 2 diabetes mellitus with diabetic polyneuropathy: Secondary | ICD-10-CM | POA: Insufficient documentation

## 2021-02-08 DIAGNOSIS — R195 Other fecal abnormalities: Secondary | ICD-10-CM | POA: Insufficient documentation

## 2021-02-08 DIAGNOSIS — E785 Hyperlipidemia, unspecified: Secondary | ICD-10-CM | POA: Insufficient documentation

## 2021-02-08 DIAGNOSIS — E119 Type 2 diabetes mellitus without complications: Secondary | ICD-10-CM | POA: Diagnosis not present

## 2021-02-08 DIAGNOSIS — B351 Tinea unguium: Secondary | ICD-10-CM | POA: Diagnosis not present

## 2021-02-08 DIAGNOSIS — Z1211 Encounter for screening for malignant neoplasm of colon: Secondary | ICD-10-CM | POA: Insufficient documentation

## 2021-02-08 DIAGNOSIS — H9190 Unspecified hearing loss, unspecified ear: Secondary | ICD-10-CM | POA: Insufficient documentation

## 2021-02-08 DIAGNOSIS — R4189 Other symptoms and signs involving cognitive functions and awareness: Secondary | ICD-10-CM | POA: Insufficient documentation

## 2021-02-08 MED ORDER — AMMONIUM LACTATE 12 % EX LOTN: TOPICAL_LOTION | CUTANEOUS | 4 refills | Status: DC

## 2021-02-09 ENCOUNTER — Other Ambulatory Visit: Payer: Self-pay | Admitting: Cardiovascular Disease

## 2021-02-09 NOTE — Telephone Encounter (Signed)
Rx(s) sent to pharmacy electronically.  

## 2021-02-09 NOTE — Progress Notes (Signed)
Subjective: Melissa Bishop is a pleasant 75 y.o. female patient seen today preventative diabetic foot care and painful callus(es) b/l great toes and painful mycotic toenails b/l that are difficult to trim. Pain interferes with ambulation. Aggravating factors include wearing enclosed shoe gear. Pain is relieved with periodic professional debridement.   She c/o dry, flaky skin and lotion is not working for her feet.  No Known Allergies  Objective: Physical Exam  General: Melissa Bishop is a pleasant 75 y.o.  Caucasian female, in NAD. AAO x 3.   Vascular:  Neurovascular status unchanged b/l lower extremities. Capillary fill time to digits <3 seconds b/l lower extremities. Palpable DP pulses b/l. Palpable PT pulses b/l. Pedal hair sparse b/l. Skin temperature gradient within normal limits b/l.  Dermatological:  Pedal skin with normal turgor, texture and tone bilaterally. No open wounds bilaterally. No interdigital macerations bilaterally. Toenails 1-5 b/l elongated, discolored, dystrophic, thickened, crumbly with subungual debris and tenderness to dorsal palpation. There is evidence of subacute subungual hematoma of the L 2nd toe and R 2nd toe. Nailplate remains adhered. There is no  tenderness to palpation. Hyperkeratotic lesion(s) L hallux and R hallux.  No erythema, no edema, no drainage, no fluctuance. Pedal skin noted to be dry and flaky left foot and right foot.  Musculoskeletal:  Normal muscle strength 5/5 to all lower extremity muscle groups bilaterally. No pain crepitus or joint limitation noted with ROM b/l. No gross bony deformities bilaterally.  Neurological:  Protective sensation intact 5/5 intact bilaterally with 10g monofilament b/l. Vibratory sensation intact b/l.  Assessment and Plan:  1. Pain due to onychomycosis of toenails of both feet   2. Xerosis cutis   3. Callus   4. Controlled type 2 diabetes mellitus without complication, without long-term current use of insulin (HCC)     -Examined patient. -Toenails 1-5 b/l were debrided in length and girth with sterile nail nippers and dremel without iatrogenic bleeding.  -Callus(es) L hallux and R hallux pared utilizing sterile scalpel blade without complication or incident. Total number debrided =2. -Patient to report any pedal injuries to medical professional immediately. -Rx for Ammonium Lactate Lotion 12% to be applied twice daily for xerosis. -Patient/POA to call should there be a concern in the interim.  Return in about 3 months (around 05/11/2021).  Marzetta Board, DPM

## 2021-02-16 ENCOUNTER — Other Ambulatory Visit: Payer: Self-pay | Admitting: Cardiovascular Disease

## 2021-02-16 DIAGNOSIS — L409 Psoriasis, unspecified: Secondary | ICD-10-CM | POA: Diagnosis not present

## 2021-02-16 DIAGNOSIS — E559 Vitamin D deficiency, unspecified: Secondary | ICD-10-CM | POA: Diagnosis not present

## 2021-02-16 DIAGNOSIS — Z7984 Long term (current) use of oral hypoglycemic drugs: Secondary | ICD-10-CM | POA: Diagnosis not present

## 2021-02-16 DIAGNOSIS — E785 Hyperlipidemia, unspecified: Secondary | ICD-10-CM | POA: Diagnosis not present

## 2021-02-16 DIAGNOSIS — E1159 Type 2 diabetes mellitus with other circulatory complications: Secondary | ICD-10-CM | POA: Diagnosis not present

## 2021-02-16 DIAGNOSIS — E1165 Type 2 diabetes mellitus with hyperglycemia: Secondary | ICD-10-CM | POA: Diagnosis not present

## 2021-02-16 DIAGNOSIS — R4189 Other symptoms and signs involving cognitive functions and awareness: Secondary | ICD-10-CM | POA: Diagnosis not present

## 2021-02-16 DIAGNOSIS — E1142 Type 2 diabetes mellitus with diabetic polyneuropathy: Secondary | ICD-10-CM | POA: Diagnosis not present

## 2021-02-16 DIAGNOSIS — I5022 Chronic systolic (congestive) heart failure: Secondary | ICD-10-CM | POA: Diagnosis not present

## 2021-02-16 DIAGNOSIS — I10 Essential (primary) hypertension: Secondary | ICD-10-CM | POA: Diagnosis not present

## 2021-02-16 DIAGNOSIS — Z79899 Other long term (current) drug therapy: Secondary | ICD-10-CM | POA: Diagnosis not present

## 2021-02-16 DIAGNOSIS — I4821 Permanent atrial fibrillation: Secondary | ICD-10-CM | POA: Diagnosis not present

## 2021-02-16 DIAGNOSIS — R808 Other proteinuria: Secondary | ICD-10-CM | POA: Diagnosis not present

## 2021-02-16 DIAGNOSIS — D6869 Other thrombophilia: Secondary | ICD-10-CM | POA: Diagnosis not present

## 2021-02-16 DIAGNOSIS — M810 Age-related osteoporosis without current pathological fracture: Secondary | ICD-10-CM | POA: Diagnosis not present

## 2021-02-21 ENCOUNTER — Ambulatory Visit (INDEPENDENT_AMBULATORY_CARE_PROVIDER_SITE_OTHER): Payer: Medicare Other | Admitting: Pharmacist Clinician (PhC)/ Clinical Pharmacy Specialist

## 2021-02-21 ENCOUNTER — Other Ambulatory Visit: Payer: Self-pay

## 2021-02-21 DIAGNOSIS — I5020 Unspecified systolic (congestive) heart failure: Secondary | ICD-10-CM | POA: Diagnosis not present

## 2021-02-21 NOTE — Progress Notes (Signed)
Patient ID: [name]                 DOB: [birth year]                      MRN: [id number]     HPI: Melissa Bishop is a 75 y.o. female referred by Dr. Audie Bishop to pharmacist clinic for HF medication titration. PMH includes hypertension, HFrEF (EF 25-30% in 2021), permanent atrail fibrillation, mixed hyperlipidemia, and diabetes.  She was referred by Dr. Audie Bishop when he switched her lisinopril to [facility] 24/26.  She was already on metoprolol, spironolactone, and Jardiance.   Her first visit was three weeks later, however she had run out of Entresto after her sample and had not filled a prescription.  Chart noted that she has legal guardian, however she was alone at that appointment.  Her mother and caregiver were reached by phone (they were downstairs in [place]).   Today she returns for follow up.  She was seen by her PCP last week and it appears that they did not have a complete medication list.  PCP re-started lisinopril at 20 mg daily.  Fortunately she did not pick up the prescription until Sunday and took only 2 doses (Sunday, Monday).   She is here today with a friend (not guardian), and the friend answers many of the questions as well as notes that she is responsible for filling Melissa Bishop' 7-day pill minder each week.  States she has been doing much better with compliance.  Current meds:  Entresto 24/26mg  twice daily  Jardiance 10mg  daily Spironolactone 12.5mg  daily Metoprolol succinate 150mg  daily Furosemide 40mg  daily  BP goal: <130/80  Family History: heart disease in mother  Social History: former smoker (quit about 15 years ago), denies alcohol use, drinks 1 cup decaf per day, occasional sweet tea,   Diet:  home cooked, shares job with mother (age 80); vegetables mostly fresh, tomatoes home grown; mostly chicken (skinless) for protein, fish (white fishes)  Exercise: activities of daily living  Home BP readings: none provided  Wt Readings from Last 3 Encounters:  02/21/21 220 lb (99.8  kg)  01/25/21 218 lb 3.2 oz (99 kg)  01/04/21 219 lb 9.6 oz (99.6 kg)   BP Readings from Last 3 Encounters:  02/21/21 118/76  01/25/21 120/82  01/04/21 138/82   Pulse Readings from Last 3 Encounters:  02/21/21 71  01/25/21 76  01/04/21 (!) 16    Past Medical History:  Diagnosis Date   CHF (congestive heart failure) (HCC)    Coronary artery disease    Diabetes mellitus without complication (HCC)    High cholesterol    Hypertension    Psoriasis     Current Outpatient Medications on File Prior to Visit  Medication Sig Dispense Refill   ammonium lactate (LAC-HYDRIN) 12 % lotion Apply to both feet twice daily. Do not apply between toes. 400 g 4   apixaban (ELIQUIS) 5 MG TABS tablet Take 1 tablet (5 mg total) by mouth 2 (two) times daily. 90 tablet 1   atorvastatin (LIPITOR) 80 MG tablet Take 80 mg by mouth at bedtime.      Cholecalciferol (VITAMIN D3) 50 MCG (2000 UT) TABS Take 2,000 Units by mouth daily.     diltiazem (CARDIZEM) 120 MG tablet Take 120 mg by mouth daily.     empagliflozin (JARDIANCE) 10 MG TABS tablet Take 1 tablet (10 mg total) by mouth daily before breakfast. 30 tablet 2   ENTRESTO 24-26  MG TAKE 1 TABLET BY MOUTH TWICE A DAY 60 tablet 6   ezetimibe (ZETIA) 10 MG tablet Take 10 mg by mouth daily.     furosemide (LASIX) 40 MG tablet TAKE 1 TABLET BY MOUTH EVERY DAY 90 tablet 1   metFORMIN (GLUCOPHAGE) 500 MG tablet Take 500 mg by mouth 2 (two) times daily with a meal.     metoprolol succinate (TOPROL-XL) 100 MG 24 hr tablet TAKE 1.5 TABLETS (150 MG TOTAL) BY MOUTH DAILY. TAKE WITH OR IMMEDIATELY FOLLOWING A MEAL. 135 tablet 3   Multiple Vitamin (MULTIVITAMIN WITH MINERALS) TABS tablet Take 1 tablet by mouth daily. Centrum     Multiple Vitamins-Minerals (PRESERVISION AREDS 2) CAPS Take 1 capsule by mouth daily.      spironolactone (ALDACTONE) 25 MG tablet TAKE 1/2 TABLET BY MOUTH EVERY DAY 45 tablet 3   STELARA 45 MG/0.5ML SOSY injection Inject into the skin.      No current facility-administered medications on file prior to visit.    No Known Allergies  Blood pressure 118/76, pulse 71, resp. rate 15, height 5\' 11"  (1.803 m), weight 220 lb (99.8 kg), SpO2 96 %.  HFrEF (heart failure with reduced ejection fraction) ([place]) Patient with HFrEF currently on GDMT with Entresto, metoprolol, spironolactone and Jardiance.  Fortunately she has only had 2 doses of lisinopril that was prescribed last week.  Friend assures that they will take that out of her pill minder as soon as they get home.  Blood pressure was good and we won't try to further increase the Entresto dose at this time.  Made sure that her medication list in Epic is correct, and stressed that they need to be taking this to all medical appointments.  She has a scheduled follow up with PA in September.     Tommy Medal PharmD CPP [facility] [address] [place] [address] 02/23/2021 1:06 PM

## 2021-02-21 NOTE — Patient Instructions (Signed)
  Take your BP meds as follows:  STOP LISINOPRIL (is not to be taken while taking Entresto)  CONTINUE WITH ALL OTHER MEDICATIONS  Bring all of your meds, your BP cuff and your record of home blood pressures to your next appointment.  Exercise as you're able, try to walk approximately 30 minutes per day.  Keep salt intake to a minimum, especially watch canned and prepared boxed foods.  Eat more fresh fruits and vegetables and fewer canned items.  Avoid eating in fast food restaurants.    HOW TO TAKE YOUR BLOOD PRESSURE: Rest 5 minutes before taking your blood pressure.  Don't smoke or drink caffeinated beverages for at least 30 minutes before. Take your blood pressure before (not after) you eat. Sit comfortably with your back supported and both feet on the floor (don't cross your legs). Elevate your arm to heart level on a table or a desk. Use the proper sized cuff. It should fit smoothly and snugly around your bare upper arm. There should be enough room to slip a fingertip under the cuff. The bottom edge of the cuff should be 1 inch above the crease of the elbow. Ideally, take 3 measurements at one sitting and record the average.

## 2021-02-23 NOTE — Assessment & Plan Note (Signed)
Patient with HFrEF currently on GDMT with Entresto, metoprolol, spironolactone and Jardiance.  Fortunately she has only had 2 doses of lisinopril that was prescribed last week.  Friend assures that they will take that out of her pill minder as soon as they get home.  Blood pressure was good and we won't try to further increase the Entresto dose at this time.  Made sure that her medication list in Epic is correct, and stressed that they need to be taking this to all medical appointments.  She has a scheduled follow up with PA in September.

## 2021-03-01 DIAGNOSIS — Z20822 Contact with and (suspected) exposure to covid-19: Secondary | ICD-10-CM | POA: Diagnosis not present

## 2021-03-02 ENCOUNTER — Telehealth: Payer: Self-pay | Admitting: Cardiovascular Disease

## 2021-03-02 NOTE — Telephone Encounter (Addendum)
According to the chart the diltiazem was entered into the chart on 01/25/21 when she was seen by the pharm md. Left message for pt to call to confirm medications. Left message for dana, investing and will let her know.

## 2021-03-02 NOTE — Telephone Encounter (Signed)
    Pt c/o medication issue:  1. Name of Medication:   diltiazem (CARDIZEM) 120 MG tablet    2. How are you currently taking this medication (dosage and times per day)? Take 120 mg by mouth daily.  3. Are you having a reaction (difficulty breathing--STAT)?   4. What is your medication issue? Dana with [facility] [employer], she said Dr. Addison Lank would like to verify if pt should be on diltiazem, she said they received OV noted on 02/22 that Dr. Audie Box discontinued it but its still on pt's meds list

## 2021-03-06 ENCOUNTER — Ambulatory Visit
Admission: RE | Admit: 2021-03-06 | Discharge: 2021-03-06 | Disposition: A | Discharge status: Home / Self Care | Payer: Medicare Other | Source: Ambulatory Visit | Attending: Family Medicine | Admitting: Family Medicine

## 2021-03-06 ENCOUNTER — Other Ambulatory Visit: Payer: Self-pay

## 2021-03-06 DIAGNOSIS — Z1231 Encounter for screening mammogram for malignant neoplasm of breast: Secondary | ICD-10-CM | POA: Diagnosis not present

## 2021-03-09 ENCOUNTER — Ambulatory Visit (INDEPENDENT_AMBULATORY_CARE_PROVIDER_SITE_OTHER): Payer: Medicare Other

## 2021-03-09 DIAGNOSIS — I4821 Permanent atrial fibrillation: Secondary | ICD-10-CM

## 2021-03-10 LAB — CUP PACEART REMOTE DEVICE CHECK
Battery Remaining Longevity: 85 mo
Battery Remaining Percentage: 87 %
Battery Voltage: 3.01 V
Date Time Interrogation Session: 20220728080048
HighPow Impedance: 98 Ohm
Implantable Lead Implant Date: 20211025
Implantable Lead Implant Date: 20211025
Implantable Lead Location: 753858
Implantable Lead Location: 753860
Implantable Pulse Generator Implant Date: 20211025
Lead Channel Impedance Value: 1300 Ohm
Lead Channel Impedance Value: 440 Ohm
Lead Channel Pacing Threshold Amplitude: 0.5 V
Lead Channel Pacing Threshold Amplitude: 0.5 V
Lead Channel Pacing Threshold Pulse Width: 0.5 ms
Lead Channel Pacing Threshold Pulse Width: 0.5 ms
Lead Channel Sensing Intrinsic Amplitude: 12 mV
Lead Channel Setting Pacing Amplitude: 2.5 V
Lead Channel Setting Pacing Amplitude: 2.5 V
Lead Channel Setting Pacing Pulse Width: 0.5 ms
Lead Channel Setting Pacing Pulse Width: 0.5 ms
Lead Channel Setting Sensing Sensitivity: 0.5 mV
Pulse Gen Serial Number: 810006964

## 2021-03-13 DIAGNOSIS — Z20822 Contact with and (suspected) exposure to covid-19: Secondary | ICD-10-CM | POA: Diagnosis not present

## 2021-03-15 NOTE — Telephone Encounter (Addendum)
Spoke with pt, she reports she is taking the diltiazem and the lisinopril has been stopped. Hinton Dyer at [facility] made aware.

## 2021-03-17 ENCOUNTER — Encounter: Payer: Medicare Other | Admitting: Cardiology

## 2021-03-23 DIAGNOSIS — Z79899 Other long term (current) drug therapy: Secondary | ICD-10-CM | POA: Diagnosis not present

## 2021-03-23 DIAGNOSIS — L400 Psoriasis vulgaris: Secondary | ICD-10-CM | POA: Diagnosis not present

## 2021-04-04 NOTE — Progress Notes (Signed)
Carelink Summary Report / Loop Recorder 

## 2021-04-11 ENCOUNTER — Other Ambulatory Visit: Payer: Self-pay

## 2021-04-11 ENCOUNTER — Encounter: Payer: Medicare Other | Admitting: Cardiology

## 2021-04-11 DIAGNOSIS — I5022 Chronic systolic (congestive) heart failure: Secondary | ICD-10-CM

## 2021-04-11 DIAGNOSIS — I4821 Permanent atrial fibrillation: Secondary | ICD-10-CM

## 2021-04-20 DIAGNOSIS — Z7984 Long term (current) use of oral hypoglycemic drugs: Secondary | ICD-10-CM | POA: Diagnosis not present

## 2021-04-20 DIAGNOSIS — E785 Hyperlipidemia, unspecified: Secondary | ICD-10-CM | POA: Diagnosis not present

## 2021-04-20 DIAGNOSIS — I4821 Permanent atrial fibrillation: Secondary | ICD-10-CM | POA: Diagnosis not present

## 2021-04-20 DIAGNOSIS — Z79899 Other long term (current) drug therapy: Secondary | ICD-10-CM | POA: Diagnosis not present

## 2021-04-20 DIAGNOSIS — E1142 Type 2 diabetes mellitus with diabetic polyneuropathy: Secondary | ICD-10-CM | POA: Diagnosis not present

## 2021-04-20 DIAGNOSIS — D6869 Other thrombophilia: Secondary | ICD-10-CM | POA: Diagnosis not present

## 2021-04-20 DIAGNOSIS — E1159 Type 2 diabetes mellitus with other circulatory complications: Secondary | ICD-10-CM | POA: Diagnosis not present

## 2021-04-20 DIAGNOSIS — I5022 Chronic systolic (congestive) heart failure: Secondary | ICD-10-CM | POA: Diagnosis not present

## 2021-04-20 DIAGNOSIS — E559 Vitamin D deficiency, unspecified: Secondary | ICD-10-CM | POA: Diagnosis not present

## 2021-04-20 DIAGNOSIS — M810 Age-related osteoporosis without current pathological fracture: Secondary | ICD-10-CM | POA: Diagnosis not present

## 2021-04-20 DIAGNOSIS — I10 Essential (primary) hypertension: Secondary | ICD-10-CM | POA: Diagnosis not present

## 2021-04-20 DIAGNOSIS — R4189 Other symptoms and signs involving cognitive functions and awareness: Secondary | ICD-10-CM | POA: Diagnosis not present

## 2021-04-26 DIAGNOSIS — Z20822 Contact with and (suspected) exposure to covid-19: Secondary | ICD-10-CM | POA: Diagnosis not present

## 2021-05-01 DIAGNOSIS — Z961 Presence of intraocular lens: Secondary | ICD-10-CM | POA: Diagnosis not present

## 2021-05-01 DIAGNOSIS — H35372 Puckering of macula, left eye: Secondary | ICD-10-CM | POA: Diagnosis not present

## 2021-05-01 DIAGNOSIS — E119 Type 2 diabetes mellitus without complications: Secondary | ICD-10-CM | POA: Diagnosis not present

## 2021-05-01 DIAGNOSIS — H5315 Visual distortions of shape and size: Secondary | ICD-10-CM | POA: Diagnosis not present

## 2021-05-02 DIAGNOSIS — Z20822 Contact with and (suspected) exposure to covid-19: Secondary | ICD-10-CM | POA: Diagnosis not present

## 2021-05-03 NOTE — Progress Notes (Signed)
Electrophysiology Office Follow up Visit Note:    Date:  05/04/2021   ID:  Melissa Bishop , DOB 1946/07/01, MRN [id number]  PCP:  [name], [facility] Cardiologist:  Evalina Field, MD  [facility] Electrophysiologist:  Vickie Epley, MD    Interval History:    Melissa Bishop is a 75 y.o. female who presents for a follow up visit. They were last seen in clinic 09/12/2020 after her AV junction ablation.  She saw Tommy Medal on 02/21/2021 Today she tells me she is doing very well.  She goes bowling once a week and she enjoys herself.  No lightheadedness or dizziness.  No syncope.  Device is functioning well.   Past Medical History:  Diagnosis Date   CHF (congestive heart failure) ([place])    Coronary artery disease    Diabetes mellitus without complication ([place])    High cholesterol    Hypertension    Psoriasis     Past Surgical History:  Procedure Laterality Date   AV NODE ABLATION N/A 07/18/2020   Procedure: AV NODE ABLATION;  Surgeon: Vickie Epley, MD;  Location: [place] [facility];  Service: Cardiovascular;  Laterality: N/A;   BIV ICD INSERTION CRT-D N/A 06/06/2020   Procedure: BIV ICD INSERTION CRT-D;  Surgeon: Vickie Epley, MD;  Location: [place] [facility];  Service: Cardiovascular;  Laterality: N/A;   BREAST EXCISIONAL BIOPSY Right ? more than 30 years   BREAST SURGERY     CARDIOVERSION N/A 04/04/2020   Procedure: CARDIOVERSION;  Surgeon: Jerline Pain, MD;  Location: [facility] ENDOSCOPY;  Service: Cardiovascular;  Laterality: N/A;   CARDIOVERSION N/A 05/10/2020   Procedure: CARDIOVERSION;  Surgeon: [name], MD;  Location: [place];  Service: Cardiovascular;  Laterality: N/A;   TEE WITHOUT CARDIOVERSION N/A 04/04/2020   Procedure: TRANSESOPHAGEAL ECHOCARDIOGRAM (TEE);  Surgeon: Jerline Pain, MD;  Location: [facility] ENDOSCOPY;  Service: Cardiovascular;  Laterality: N/A;    Current Medications: Current Meds  Medication Sig   ammonium  lactate (LAC-HYDRIN) 12 % lotion Apply to both feet twice daily. Do not apply between toes.   apixaban (ELIQUIS) 5 MG TABS tablet Take 1 tablet (5 mg total) by mouth 2 (two) times daily.   atorvastatin (LIPITOR) 80 MG tablet Take 80 mg by mouth at bedtime.    Cholecalciferol (VITAMIN D3) 50 MCG (2000 UT) TABS Take 2,000 Units by mouth daily.   diltiazem (CARDIZEM) 120 MG tablet Take 120 mg by mouth daily.   empagliflozin (JARDIANCE) 10 MG TABS tablet Take 1 tablet (10 mg total) by mouth daily before breakfast.   ENTRESTO 24-26 MG TAKE 1 TABLET BY MOUTH TWICE A DAY   ezetimibe (ZETIA) 10 MG tablet Take 10 mg by mouth daily.   furosemide (LASIX) 40 MG tablet TAKE 1 TABLET BY MOUTH EVERY DAY   metFORMIN (GLUCOPHAGE) 500 MG tablet Take 500 mg by mouth 2 (two) times daily with a meal.   metoprolol succinate (TOPROL-XL) 100 MG 24 hr tablet TAKE 1.5 TABLETS (150 MG TOTAL) BY MOUTH DAILY. TAKE WITH OR IMMEDIATELY FOLLOWING A MEAL.   Multiple Vitamin (MULTIVITAMIN WITH MINERALS) TABS tablet Take 1 tablet by mouth daily. Centrum   Multiple Vitamins-Minerals (PRESERVISION AREDS 2) CAPS Take 1 capsule by mouth daily.    spironolactone (ALDACTONE) 25 MG tablet TAKE 1/2 TABLET BY MOUTH EVERY DAY   STELARA 45 MG/0.5ML SOSY injection Inject into the skin.     Allergies:   Patient has no known allergies.  Social History   Socioeconomic History   Marital status: Single    Spouse name: Not on file   Number of children: Not on file   Years of education: Not on file   Highest education level: Not on file  Occupational History   Not on file  Tobacco Use   Smoking status: Former   Smokeless tobacco: Never  Vaping Use   Vaping Use: Never used  Substance and Sexual Activity   Alcohol use: No   Drug use: No   Sexual activity: Not on file  Other Topics Concern   Not on file  Social History Narrative   Not on file   Social Determinants of Health   Financial Resource Strain: Not on file  Food  Insecurity: Not on file  Transportation Needs: Not on file  Physical Activity: Not on file  Stress: Not on file  Social Connections: Not on file     Family History: The patient's family history includes Heart disease in her mother.  ROS:   Please see the history of present illness.    All other systems reviewed and are negative.  EKGs/Labs/Other Studies Reviewed:    The following studies were reviewed today:   May 04, 2021 in clinic device interrogation personally reviewed Battery longevity 7.8 years Lead parameters are stable Greater than 99% biventricular pacing Presenting rhythm complete heart block at 30 bpm, atrial fibrillation, BiV pacing at 74 bpm. Histogram is flat    EKG:  The ekg ordered today demonstrates atrial fibrillation and a biventricular paced rhythm.   Recent Labs: 10/26/2020: BUN 19; Creatinine, Ser 0.94; Hemoglobin 15.6; Platelets 231; Potassium 4.2; Sodium 139  Recent Lipid Panel    Component Value Date/Time   CHOL 200 04/02/2020 0231   TRIG 104 04/02/2020 0231   HDL 37 (L) 04/02/2020 0231   CHOLHDL 5.4 04/02/2020 0231   VLDL 21 04/02/2020 0231   LDLCALC 142 (H) 04/02/2020 0231    Physical Exam:    VS:  BP (!) 142/80   Pulse 74   Ht 5\' 11"  (1.803 m)   Wt 219 lb (99.3 kg)   SpO2 96%   BMI 30.54 kg/m     Wt Readings from Last 3 Encounters:  05/04/21 219 lb (99.3 kg)  02/21/21 220 lb (99.8 kg)  01/25/21 218 lb 3.2 oz (99 kg)     GEN: Well nourished, well developed in no acute distress HEENT: Normal NECK: No JVD; No carotid bruits LYMPHATICS: No lymphadenopathy CARDIAC: RRR, no murmurs, rubs, gallops.  ICD site well-healed. RESPIRATORY:  Clear to auscultation without rales, wheezing or rhonchi  ABDOMEN: Soft, non-tender, non-distended MUSCULOSKELETAL:  No edema; No deformity  SKIN: Warm and dry NEUROLOGIC:  Alert and oriented x 3 PSYCHIATRIC:  Normal affect   ASSESSMENT:    1. HFrEF (heart failure with reduced ejection  fraction) ([place])   2. Abnormal sensing of cardiac resynchronization therapy defibrillator (CRT-D)   3. NICM (nonischemic cardiomyopathy) ([place])   4. Permanent atrial fibrillation ([place])   5. Chronic systolic heart failure (HCC)    PLAN:    In order of problems listed above:  1. HFrEF (heart failure with reduced ejection fraction) (HCC) NYHA class II.  Warm and dry on exam.  Continue current medical therapy.  Effective BiV greater than 99% on today's check.  3. NICM (nonischemic cardiomyopathy) ([place]) See 1  4. Permanent atrial fibrillation (HCC) Post AV junction ablation.  Biventricular pacing greater than 99%.  Continue anticoagulation.  5. Chronic systolic heart  failure [facility]) See #1     Follow-up in clinic in 1 year or sooner as needed.  Medication Adjustments/Labs and Tests Ordered: Current medicines are reviewed at length with the patient today.  Concerns regarding medicines are outlined above.  No orders of the defined types were placed in this encounter.  No orders of the defined types were placed in this encounter.    Signed, Lars Mage, MD, [facility], [facility] 05/04/2021 10:12 AM    Electrophysiology [facility]

## 2021-05-04 ENCOUNTER — Other Ambulatory Visit: Payer: Self-pay

## 2021-05-04 ENCOUNTER — Ambulatory Visit (INDEPENDENT_AMBULATORY_CARE_PROVIDER_SITE_OTHER): Payer: Medicare Other | Admitting: Cardiology

## 2021-05-04 ENCOUNTER — Encounter: Payer: Self-pay | Admitting: Cardiology

## 2021-05-04 VITALS — BP 142/80 | HR 74 | Ht 71.0 in | Wt 219.0 lb

## 2021-05-04 DIAGNOSIS — I428 Other cardiomyopathies: Secondary | ICD-10-CM | POA: Diagnosis not present

## 2021-05-04 DIAGNOSIS — T82118A Breakdown (mechanical) of other cardiac electronic device, initial encounter: Secondary | ICD-10-CM | POA: Diagnosis not present

## 2021-05-04 DIAGNOSIS — I5020 Unspecified systolic (congestive) heart failure: Secondary | ICD-10-CM

## 2021-05-04 DIAGNOSIS — I4821 Permanent atrial fibrillation: Secondary | ICD-10-CM

## 2021-05-04 DIAGNOSIS — I5022 Chronic systolic (congestive) heart failure: Secondary | ICD-10-CM | POA: Diagnosis not present

## 2021-05-04 NOTE — Patient Instructions (Signed)
Medication Instructions:  Your physician recommends that you continue on your current medications as directed. Please refer to the Current Medication list given to you today. *If you need a refill on your cardiac medications before your next appointment, please call your pharmacy*  Lab Work: None ordered. If you have labs (blood work) drawn today and your tests are completely normal, you will receive your results only by: [place] (if you have MyChart) OR A paper copy in the mail If you have any lab test that is abnormal or we need to change your treatment, we will call you to review the results.  Testing/Procedures: None ordered.  Follow-Up: At [facility], you and your health needs are our priority.  As part of our continuing mission to provide you with exceptional heart care, we have created designated Provider Care Teams.  These Care Teams include your primary Cardiologist (physician) and Advanced Practice Providers (APPs -  Physician Assistants and Nurse Practitioners) who all work together to provide you with the care you need, when you need it.  Your next appointment:   Your physician wants you to follow-up in: one year  You may see Vickie Epley, MD or one of the following Advanced Practice Providers on your designated Care Team:   Tommye Standard, [place] Legrand Como "Jonni Sanger" [place], [place] You will receive a reminder letter in the mail two months in advance. If you don't receive a letter, please call our office to schedule the follow-up appointment.  Remote monitoring is used to monitor your ICD from home. This monitoring reduces the number of office visits required to check your device to one time per year. It allows [place] to keep an eye on the functioning of your device to ensure it is working properly. You are scheduled for a device check from home on 06/08/2021. You may send your transmission at any time that day. If you have a wireless device, the transmission will be sent  automatically. After your physician reviews your transmission, you will receive a postcard with your next transmission date.

## 2021-05-08 ENCOUNTER — Other Ambulatory Visit: Payer: Self-pay

## 2021-05-08 ENCOUNTER — Encounter: Payer: Self-pay | Admitting: Physician Assistant

## 2021-05-08 ENCOUNTER — Ambulatory Visit (INDEPENDENT_AMBULATORY_CARE_PROVIDER_SITE_OTHER): Payer: Medicare Other | Admitting: Physician Assistant

## 2021-05-08 VITALS — BP 126/72 | HR 74 | Ht 71.0 in | Wt 219.2 lb

## 2021-05-08 DIAGNOSIS — Z95810 Presence of automatic (implantable) cardiac defibrillator: Secondary | ICD-10-CM

## 2021-05-08 DIAGNOSIS — Z9889 Other specified postprocedural states: Secondary | ICD-10-CM | POA: Diagnosis not present

## 2021-05-08 DIAGNOSIS — I4821 Permanent atrial fibrillation: Secondary | ICD-10-CM

## 2021-05-08 DIAGNOSIS — E119 Type 2 diabetes mellitus without complications: Secondary | ICD-10-CM

## 2021-05-08 DIAGNOSIS — I5022 Chronic systolic (congestive) heart failure: Secondary | ICD-10-CM

## 2021-05-08 NOTE — Progress Notes (Signed)
Cardiology Office Note:    Date:  05/10/2021   ID:  Melissa Bishop, DOB 1945/09/12, MRN [id number]  PCP:  [name], MD   [facility] Providers Cardiologist:  Evalina Field, MD Electrophysiologist:  Vickie Epley, MD     Referring MD: [name], MD   Chief Complaint  Patient presents with   Follow-up    Seen for Dr. Audie Box    History of Present Illness:    Melissa Bishop is a 75 y.o. female with a hx of DM II, chronic systolic heart failure, permanent afib s/p AVN ablation and BiV ICD.  Patient underwent TEE DCCV in August 2021 which failed to convert her to sinus rhythm.  Echocardiogram showed EF 25 to 30% on 04/01/2020.  She was placed on amiodarone therapy which converted her back to normal sinus rhythm.  Unfortunately she had later recurrence despite amiodarone.  She underwent CRT-D on 05/2010 and ultimately underwent AV node ablation on 07/18/2020.  Repeat at echocardiogram obtained on 10/21/2020 continue to show low EF of 25 to 30%.  She was last seen by Dr. Audie Box in May 2022, given persistently low EF, it was recommended for her to titrate heart failure medication further.  The lisinopril was stopped and she was switched to Entresto 24-26 mg twice a day.  Most recently, patient was seen by Dr. Quentin Ore on 05/04/2021, biventricular pacing was greater than 99%.  Patient presents today for follow-up.  She still have balance issue however able to have limited activity level without any chest pain or worsening dyspnea.  She feels okay after starting on Entresto.  Blood work obtained by PCPs office in July showed normal renal function, I am unable to see the potassium level though.  We will request the records.  Most recent blood work obtained in September showed elevated LDL, controlled her total cholesterol, triglyceride and HDL.  She is accompanied by a friend today, her friend says she does eat a lot of fried food.  She will need to control her diet better.  Otherwise, I was  hesitant to try to increase the Entresto any further given her balance issue.  I recommended repeat echocardiogram in 3 months prior to her follow-up with Dr. Audie Box  Past Medical History:  Diagnosis Date   CHF (congestive heart failure) ([place])    Coronary artery disease    Diabetes mellitus without complication ([place])    High cholesterol    Hypertension    Psoriasis     Past Surgical History:  Procedure Laterality Date   AV NODE ABLATION N/A 07/18/2020   Procedure: AV NODE ABLATION;  Surgeon: Vickie Epley, MD;  Location: [place] [facility];  Service: Cardiovascular;  Laterality: N/A;   BIV ICD INSERTION CRT-D N/A 06/06/2020   Procedure: BIV ICD INSERTION CRT-D;  Surgeon: Vickie Epley, MD;  Location: [place] [facility];  Service: Cardiovascular;  Laterality: N/A;   BREAST EXCISIONAL BIOPSY Right ? more than 30 years   BREAST SURGERY     CARDIOVERSION N/A 04/04/2020   Procedure: CARDIOVERSION;  Surgeon: Jerline Pain, MD;  Location: [facility] ENDOSCOPY;  Service: Cardiovascular;  Laterality: N/A;   CARDIOVERSION N/A 05/10/2020   Procedure: CARDIOVERSION;  Surgeon: [name], MD;  Location: [place];  Service: Cardiovascular;  Laterality: N/A;   TEE WITHOUT CARDIOVERSION N/A 04/04/2020   Procedure: TRANSESOPHAGEAL ECHOCARDIOGRAM (TEE);  Surgeon: Jerline Pain, MD;  Location: [facility] ENDOSCOPY;  Service: Cardiovascular;  Laterality: N/A;    Current Medications: Current Meds  Medication Sig   ammonium lactate (LAC-HYDRIN) 12 % lotion Apply to both feet twice daily. Do not apply between toes.   apixaban (ELIQUIS) 5 MG TABS tablet Take 1 tablet (5 mg total) by mouth 2 (two) times daily.   atorvastatin (LIPITOR) 80 MG tablet Take 80 mg by mouth at bedtime.    Cholecalciferol (VITAMIN D3) 50 MCG (2000 UT) TABS Take 2,000 Units by mouth daily.   diltiazem (CARDIZEM) 120 MG tablet Take 120 mg by mouth daily.   empagliflozin (JARDIANCE) 10 MG TABS tablet Take 1 tablet (10 mg total)  by mouth daily before breakfast.   ENTRESTO 24-26 MG TAKE 1 TABLET BY MOUTH TWICE A DAY   ezetimibe (ZETIA) 10 MG tablet Take 10 mg by mouth daily.   furosemide (LASIX) 40 MG tablet TAKE 1 TABLET BY MOUTH EVERY DAY   metFORMIN (GLUCOPHAGE) 500 MG tablet Take 500 mg by mouth 2 (two) times daily with a meal.   metoprolol succinate (TOPROL-XL) 100 MG 24 hr tablet TAKE 1.5 TABLETS (150 MG TOTAL) BY MOUTH DAILY. TAKE WITH OR IMMEDIATELY FOLLOWING A MEAL.   Multiple Vitamin (MULTIVITAMIN WITH MINERALS) TABS tablet Take 1 tablet by mouth daily. Centrum   Multiple Vitamins-Minerals (PRESERVISION AREDS 2) CAPS Take 1 capsule by mouth daily.    spironolactone (ALDACTONE) 25 MG tablet TAKE 1/2 TABLET BY MOUTH EVERY DAY   STELARA 45 MG/0.5ML SOSY injection Inject into the skin.     Allergies:   Patient has no known allergies.   Social History   Socioeconomic History   Marital status: Single    Spouse name: Not on file   Number of children: Not on file   Years of education: Not on file   Highest education level: Not on file  Occupational History   Not on file  Tobacco Use   Smoking status: Former   Smokeless tobacco: Never  Vaping Use   Vaping Use: Never used  Substance and Sexual Activity   Alcohol use: No   Drug use: No   Sexual activity: Not on file  Other Topics Concern   Not on file  Social History Narrative   Not on file   Social Determinants of Health   Financial Resource Strain: Not on file  Food Insecurity: Not on file  Transportation Needs: Not on file  Physical Activity: Not on file  Stress: Not on file  Social Connections: Not on file     Family History: The patient's family history includes Heart disease in her mother.  ROS:   Please see the history of present illness.     All other systems reviewed and are negative.  EKGs/Labs/Other Studies Reviewed:    The following studies were reviewed today:  Echo 10/21/2020  1. Left ventricular ejection fraction, by  estimation, is 25 to 30%. The  left ventricle has severely decreased function. The left ventricle  demonstrates global hypokinesis. The left ventricular internal cavity size  was mildly dilated. There is mild  concentric left ventricular hypertrophy. Left ventricular diastolic  parameters are indeterminate.   2. Right ventricular systolic function is moderately to severely reduced.  The right ventricular size is moderately enlarged. There is normal  pulmonary artery systolic pressure.   3. The mitral valve is normal in structure. No evidence of mitral valve  regurgitation. No evidence of mitral stenosis.   4. The aortic valve is grossly normal. Aortic valve regurgitation is  trivial. No aortic stenosis is present.   Comparison(s): A prior study was  performed on 04/01/2020. Slight increase  in left and right ventricular function.   EKG:  EKG is not ordered today.   Recent Labs: 10/26/2020: BUN 19; Creatinine, Ser 0.94; Hemoglobin 15.6; Platelets 231; Potassium 4.2; Sodium 139  Recent Lipid Panel    Component Value Date/Time   CHOL 200 04/02/2020 0231   TRIG 104 04/02/2020 0231   HDL 37 (L) 04/02/2020 0231   CHOLHDL 5.4 04/02/2020 0231   VLDL 21 04/02/2020 0231   LDLCALC 142 (H) 04/02/2020 0231     Risk Assessment/Calculations:    CHA2DS2-VASc Score = 6   This indicates a 9.7% annual risk of stroke. The patient's score is based upon: CHF History: 1 HTN History: 1 Diabetes History: 1 Stroke History: 0 Vascular Disease History: 1 Age Score: 1 Gender Score: 1          Physical Exam:    VS:  BP 126/72   Pulse 74   Ht 5\' 11"  (1.803 m)   Wt 219 lb 3.2 oz (99.4 kg)   SpO2 97%   BMI 30.57 kg/m     Wt Readings from Last 3 Encounters:  05/08/21 219 lb 3.2 oz (99.4 kg)  05/04/21 219 lb (99.3 kg)  02/21/21 220 lb (99.8 kg)     GEN:  Well nourished, well developed in no acute distress HEENT: Normal NECK: No JVD; No carotid bruits LYMPHATICS: No  lymphadenopathy CARDIAC: RRR, no murmurs, rubs, gallops RESPIRATORY:  Clear to auscultation without rales, wheezing or rhonchi  ABDOMEN: Soft, non-tender, non-distended MUSCULOSKELETAL:  No edema; No deformity  SKIN: Warm and dry NEUROLOGIC:  Alert and oriented x 3 PSYCHIATRIC:  Normal affect   ASSESSMENT:    1. Chronic systolic heart failure ([place])   2. Permanent atrial fibrillation ([place])   3. S/P AV nodal ablation   4. Biventricular ICD (implantable cardioverter-defibrillator) in place   5. Controlled type 2 diabetes mellitus without complication, without long-term current use of insulin (HCC)    PLAN:    In order of problems listed above:  Chronic systolic heart failure: Delene Loll was recently added to her medical regimen.  Continue metoprolol succinate, Entresto and spironolactone.  Repeat echocardiogram in 3 months prior to follow-up with Dr. Audie Box.  She is having balance issue, I was hesitant to increase Entresto due to fear of making her balance worse.  Permanent atrial fibrillation: Status post AV node ablation.  Continue Eliquis  History of biventricular ICD: Managed by EP service  DM2: Managed by primary care provider     Medication Adjustments/Labs and Tests Ordered: Current medicines are reviewed at length with the patient today.  Concerns regarding medicines are outlined above.  Orders Placed This Encounter  Procedures   ECHOCARDIOGRAM COMPLETE   No orders of the defined types were placed in this encounter.   Patient Instructions  Medication Instructions:  Your physician recommends that you continue on your current medications as directed. Please refer to the Current Medication list given to you today.  *If you need a refill on your cardiac medications before your next appointment, please call your pharmacy*   Lab Work: NONE ordered at this time of appointment   If you have labs (blood work) drawn today and your tests are completely normal, you will  receive your results only by: [place] (if you have MyChart) OR A paper copy in the mail If you have any lab test that is abnormal or we need to change your treatment, we will call you to review the results.  Testing/Procedures: Your physician has requested that you have an echocardiogram. Echocardiography is a painless test that uses sound waves to create images of your heart. It provides your doctor with information about the size and shape of your heart and how well your heart's chambers and valves are working. This procedure takes approximately one hour. There are no restrictions for this procedure.  Please schedule for 3-4 months at [facility] office prior to 4-6 month follow up with Dr. Audie Box   Follow-Up: At [facility], you and your health needs are our priority.  As part of our continuing mission to provide you with exceptional heart care, we have created designated Provider Care Teams.  These Care Teams include your primary Cardiologist (physician) and Advanced Practice Providers (APPs -  Physician Assistants and Nurse Practitioners) who all work together to provide you with the care you need, when you need it.  We recommend signing up for the patient portal called "MyChart".  Sign up information is provided on this After Visit Summary.  MyChart is used to connect with patients for Virtual Visits (Telemedicine).  Patients are able to view lab/test results, encounter notes, upcoming appointments, etc.  Non-urgent messages can be sent to your provider as well.   To learn more about what you can do with MyChart, go to [email or web address].    Your next appointment:   4-6 month(s)  The format for your next appointment:   In Person  Provider:   Eleonore Chiquito, MD  Other Instructions    Signed, Almyra Deforest, [place]  05/10/2021 11:31 PM    [place]

## 2021-05-08 NOTE — Patient Instructions (Signed)
Medication Instructions:  Your physician recommends that you continue on your current medications as directed. Please refer to the Current Medication list given to you today.  *If you need a refill on your cardiac medications before your next appointment, please call your pharmacy*   Lab Work: NONE ordered at this time of appointment   If you have labs (blood work) drawn today and your tests are completely normal, you will receive your results only by: [place] (if you have MyChart) OR A paper copy in the mail If you have any lab test that is abnormal or we need to change your treatment, we will call you to review the results.   Testing/Procedures: Your physician has requested that you have an echocardiogram. Echocardiography is a painless test that uses sound waves to create images of your heart. It provides your doctor with information about the size and shape of your heart and how well your heart's chambers and valves are working. This procedure takes approximately one hour. There are no restrictions for this procedure.  Please schedule for 3-4 months at [facility] office prior to 4-6 month follow up with Dr. Audie Box   Follow-Up: At [facility], you and your health needs are our priority.  As part of our continuing mission to provide you with exceptional heart care, we have created designated Provider Care Teams.  These Care Teams include your primary Cardiologist (physician) and Advanced Practice Providers (APPs -  Physician Assistants and Nurse Practitioners) who all work together to provide you with the care you need, when you need it.  We recommend signing up for the patient portal called "MyChart".  Sign up information is provided on this After Visit Summary.  MyChart is used to connect with patients for Virtual Visits (Telemedicine).  Patients are able to view lab/test results, encounter notes, upcoming appointments, etc.  Non-urgent messages can be sent to your provider as  well.   To learn more about what you can do with MyChart, go to [email or web address].    Your next appointment:   4-6 month(s)  The format for your next appointment:   In Person  Provider:   Eleonore Chiquito, MD  Other Instructions

## 2021-05-10 ENCOUNTER — Encounter: Payer: Self-pay | Admitting: Physician Assistant

## 2021-05-17 ENCOUNTER — Telehealth: Payer: Self-pay | Admitting: Cardiovascular Disease

## 2021-05-17 MED ORDER — EMPAGLIFLOZIN 10 MG PO TABS: 10.0000 mg | ORAL_TABLET | Freq: Every day | ORAL | 2 refills | Status: DC

## 2021-05-17 NOTE — Telephone Encounter (Signed)
*  STAT* If patient is at the pharmacy, call can be transferred to refill team.   1. Which medications need to be refilled? (please list name of each medication and dose if known) empagliflozin (JARDIANCE) 10 MG TABS tablet  2. Which pharmacy/location (including street and city if local pharmacy) is medication to be sent to? [id number] - [place], [place] - [address]  3. Do they need a 30 day or 90 day supply? 30 ds

## 2021-05-19 ENCOUNTER — Telehealth: Payer: Self-pay | Admitting: *Deleted

## 2021-05-19 NOTE — Telephone Encounter (Signed)
Patient is calling because her feet are peeling, is there anything that she can do before her upcoming appointment. Please advise. She has an upcoming appointment on 05/22/21.

## 2021-05-22 ENCOUNTER — Ambulatory Visit (INDEPENDENT_AMBULATORY_CARE_PROVIDER_SITE_OTHER): Payer: Medicare Other | Admitting: Podiatry

## 2021-05-22 ENCOUNTER — Other Ambulatory Visit: Payer: Self-pay

## 2021-05-22 ENCOUNTER — Encounter: Payer: Self-pay | Admitting: Podiatry

## 2021-05-22 DIAGNOSIS — M79674 Pain in right toe(s): Secondary | ICD-10-CM

## 2021-05-22 DIAGNOSIS — M79675 Pain in left toe(s): Secondary | ICD-10-CM

## 2021-05-22 DIAGNOSIS — L84 Corns and callosities: Secondary | ICD-10-CM

## 2021-05-22 DIAGNOSIS — E119 Type 2 diabetes mellitus without complications: Secondary | ICD-10-CM | POA: Diagnosis not present

## 2021-05-22 DIAGNOSIS — L853 Xerosis cutis: Secondary | ICD-10-CM

## 2021-05-22 DIAGNOSIS — B351 Tinea unguium: Secondary | ICD-10-CM

## 2021-05-22 DIAGNOSIS — Z23 Encounter for immunization: Secondary | ICD-10-CM | POA: Diagnosis not present

## 2021-05-22 NOTE — Patient Instructions (Addendum)
Apply CeraVe Healing Ointment to both feet once daily.

## 2021-05-27 NOTE — Progress Notes (Signed)
Subjective: Melissa Bishop is a pleasant 75 y.o. female patient seen today preventative diabetic foot care and painful callus(es) b/l great toes and painful mycotic toenails b/l that are difficult to trim. Pain interferes with ambulation. Aggravating factors include wearing enclosed shoe gear. Pain is relieved with periodic professional debridement.   She continues to complain of dry skin on both feet. She states she has used the  [place] as prescribed without any noticeable improvement.  No Known Allergies  Objective: Physical Exam  General: Melissa Bishop is a pleasant 75 y.o.  Caucasian female, in NAD. AAO x 3.   Vascular:  Neurovascular status unchanged b/l lower extremities. Capillary fill time to digits <3 seconds b/l lower extremities. Palpable DP pulses b/l. Palpable PT pulses b/l. Pedal hair sparse b/l. Skin temperature gradient within normal limits b/l.  Dermatological:  Pedal skin with normal turgor, texture and tone bilaterally. No open wounds bilaterally. No interdigital macerations bilaterally. Toenails 1-5 b/l elongated, discolored, dystrophic, thickened, crumbly with subungual debris and tenderness to dorsal palpation. There is evidence of subacute subungual hematoma of the L 2nd toe and R 2nd toe. Nailplate remains adhered. There is no  tenderness to palpation. Hyperkeratotic lesion(s) L hallux and R hallux.  No erythema, no edema, no drainage, no fluctuance. Pedal skin noted to be dry and flaky left foot and right foot.  Musculoskeletal:  Normal muscle strength 5/5 to all lower extremity muscle groups bilaterally. No pain crepitus or joint limitation noted with ROM b/l. No gross bony deformities bilaterally.  Neurological:  Protective sensation intact 5/5 intact bilaterally with 10g monofilament b/l. Vibratory sensation intact b/l.  Assessment and Plan:  1. Pain due to onychomycosis of toenails of both feet   2. Xerosis cutis   3. Callus   4. Controlled type 2  diabetes mellitus without complication, without long-term current use of insulin (HCC)    -Examined patient. -Toenails 1-5 b/l were debrided in length and girth with sterile nail nippers and dremel without iatrogenic bleeding.  -Callus(es) L hallux and R hallux pared utilizing sterile scalpel blade without complication or incident. Total number debrided =2. -Patient to report any pedal injuries to medical professional immediately. -Discontinue Ammonium Lactate Lotion 12% lotion. -Recommended CeraVe Healing Ointment. Apply to both feet daily. -Patient/POA to call should there be a concern in the interim.  Return in about 3 months (around 08/22/2021).  Marzetta Board, DPM

## 2021-06-05 DIAGNOSIS — Z20822 Contact with and (suspected) exposure to covid-19: Secondary | ICD-10-CM | POA: Diagnosis not present

## 2021-06-08 ENCOUNTER — Ambulatory Visit (INDEPENDENT_AMBULATORY_CARE_PROVIDER_SITE_OTHER): Payer: Medicare Other

## 2021-06-08 DIAGNOSIS — I4821 Permanent atrial fibrillation: Secondary | ICD-10-CM

## 2021-06-08 LAB — CUP PACEART REMOTE DEVICE CHECK
Battery Remaining Longevity: 89 mo
Battery Remaining Percentage: 85 %
Battery Voltage: 3.01 V
Date Time Interrogation Session: 20221027074255
HighPow Impedance: 90 Ohm
Implantable Lead Implant Date: 20211025
Implantable Lead Implant Date: 20211025
Implantable Lead Location: 753858
Implantable Lead Location: 753860
Implantable Pulse Generator Implant Date: 20211025
Lead Channel Impedance Value: 1300 Ohm
Lead Channel Impedance Value: 440 Ohm
Lead Channel Pacing Threshold Amplitude: 0.625 V
Lead Channel Pacing Threshold Amplitude: 0.875 V
Lead Channel Pacing Threshold Pulse Width: 0.5 ms
Lead Channel Pacing Threshold Pulse Width: 0.5 ms
Lead Channel Sensing Intrinsic Amplitude: 12 mV
Lead Channel Setting Pacing Amplitude: 1.375
Lead Channel Setting Pacing Amplitude: 1.625
Lead Channel Setting Pacing Pulse Width: 0.5 ms
Lead Channel Setting Pacing Pulse Width: 0.5 ms
Lead Channel Setting Sensing Sensitivity: 0.5 mV
Pulse Gen Serial Number: 810006964

## 2021-06-15 NOTE — Progress Notes (Signed)
Remote ICD transmission.   

## 2021-06-26 DIAGNOSIS — L400 Psoriasis vulgaris: Secondary | ICD-10-CM | POA: Diagnosis not present

## 2021-07-26 ENCOUNTER — Other Ambulatory Visit (HOSPITAL_COMMUNITY): Payer: Self-pay | Admitting: Cardiovascular Disease

## 2021-08-21 ENCOUNTER — Other Ambulatory Visit (HOSPITAL_COMMUNITY): Payer: Medicare Other

## 2021-08-21 DIAGNOSIS — Z7984 Long term (current) use of oral hypoglycemic drugs: Secondary | ICD-10-CM | POA: Diagnosis not present

## 2021-08-21 DIAGNOSIS — I5022 Chronic systolic (congestive) heart failure: Secondary | ICD-10-CM | POA: Diagnosis not present

## 2021-08-21 DIAGNOSIS — M810 Age-related osteoporosis without current pathological fracture: Secondary | ICD-10-CM | POA: Diagnosis not present

## 2021-08-21 DIAGNOSIS — E1142 Type 2 diabetes mellitus with diabetic polyneuropathy: Secondary | ICD-10-CM | POA: Diagnosis not present

## 2021-08-21 DIAGNOSIS — E1159 Type 2 diabetes mellitus with other circulatory complications: Secondary | ICD-10-CM | POA: Diagnosis not present

## 2021-08-21 DIAGNOSIS — I10 Essential (primary) hypertension: Secondary | ICD-10-CM | POA: Diagnosis not present

## 2021-08-21 DIAGNOSIS — Z79899 Other long term (current) drug therapy: Secondary | ICD-10-CM | POA: Diagnosis not present

## 2021-08-21 DIAGNOSIS — I4821 Permanent atrial fibrillation: Secondary | ICD-10-CM | POA: Diagnosis not present

## 2021-08-21 DIAGNOSIS — E1165 Type 2 diabetes mellitus with hyperglycemia: Secondary | ICD-10-CM | POA: Diagnosis not present

## 2021-08-21 DIAGNOSIS — L409 Psoriasis, unspecified: Secondary | ICD-10-CM | POA: Diagnosis not present

## 2021-08-21 DIAGNOSIS — E785 Hyperlipidemia, unspecified: Secondary | ICD-10-CM | POA: Diagnosis not present

## 2021-08-24 ENCOUNTER — Other Ambulatory Visit: Payer: Self-pay

## 2021-08-24 ENCOUNTER — Ambulatory Visit (HOSPITAL_COMMUNITY): Payer: Medicare Other | Attending: Physician Assistant

## 2021-08-24 DIAGNOSIS — I5022 Chronic systolic (congestive) heart failure: Secondary | ICD-10-CM | POA: Diagnosis not present

## 2021-08-24 LAB — ECHOCARDIOGRAM COMPLETE
Area-P 1/2: 4.36 cm2
S' Lateral: 3.9 cm

## 2021-08-24 MED ORDER — PERFLUTREN LIPID MICROSPHERE
1.0000 mL | INTRAVENOUS | Status: AC | PRN
  Administered 2021-08-24: 3 mL via INTRAVENOUS

## 2021-08-28 ENCOUNTER — Telehealth: Payer: Self-pay | Admitting: Cardiovascular Disease

## 2021-08-28 ENCOUNTER — Other Ambulatory Visit: Payer: Self-pay | Admitting: Cardiovascular Disease

## 2021-08-28 MED ORDER — FUROSEMIDE 40 MG PO TABS: 40.0000 mg | ORAL_TABLET | Freq: Every day | ORAL | 1 refills | Status: DC

## 2021-08-28 NOTE — Telephone Encounter (Signed)
°*  STAT* If patient is at the pharmacy, call can be transferred to refill team.   1. Which medications need to be refilled? (please list name of each medication and dose if known)  furosemide (LASIX) 40 MG tablet  2. Which pharmacy/location (including street and city if local pharmacy) is medication to be sent to? [id number] - [place], [place] - [address]  3. Do they need a 30 day or 90 day supply? 90 ds

## 2021-08-29 ENCOUNTER — Other Ambulatory Visit: Payer: Self-pay

## 2021-08-29 ENCOUNTER — Encounter: Payer: Self-pay | Admitting: Podiatry

## 2021-08-29 ENCOUNTER — Ambulatory Visit (INDEPENDENT_AMBULATORY_CARE_PROVIDER_SITE_OTHER): Payer: Medicare Other | Admitting: Podiatry

## 2021-08-29 DIAGNOSIS — L853 Xerosis cutis: Secondary | ICD-10-CM | POA: Diagnosis not present

## 2021-08-29 DIAGNOSIS — L84 Corns and callosities: Secondary | ICD-10-CM

## 2021-08-29 DIAGNOSIS — E119 Type 2 diabetes mellitus without complications: Secondary | ICD-10-CM | POA: Diagnosis not present

## 2021-08-29 DIAGNOSIS — B351 Tinea unguium: Secondary | ICD-10-CM

## 2021-08-29 DIAGNOSIS — M79674 Pain in right toe(s): Secondary | ICD-10-CM

## 2021-08-29 DIAGNOSIS — G629 Polyneuropathy, unspecified: Secondary | ICD-10-CM | POA: Insufficient documentation

## 2021-08-29 DIAGNOSIS — M79675 Pain in left toe(s): Secondary | ICD-10-CM

## 2021-08-29 DIAGNOSIS — E559 Vitamin D deficiency, unspecified: Secondary | ICD-10-CM | POA: Insufficient documentation

## 2021-08-29 DIAGNOSIS — R296 Repeated falls: Secondary | ICD-10-CM | POA: Insufficient documentation

## 2021-08-29 DIAGNOSIS — D6869 Other thrombophilia: Secondary | ICD-10-CM | POA: Insufficient documentation

## 2021-08-29 DIAGNOSIS — N950 Postmenopausal bleeding: Secondary | ICD-10-CM | POA: Insufficient documentation

## 2021-08-29 MED ORDER — TRIAMCINOLONE ACETONIDE 0.1 % EX OINT: TOPICAL_OINTMENT | CUTANEOUS | 2 refills | Status: DC

## 2021-08-29 NOTE — Progress Notes (Signed)
ANNUAL DIABETIC FOOT EXAM  Subjective: Melissa Bishop presents today for for annual diabetic foot examination and painful elongated mycotic toenails 1-5 bilaterally which are tender when wearing enclosed shoe gear. Pain is relieved with periodic professional debridement..  Patient denies any h/o foot wounds.  Patient denies any numbness, tingling, burning, or pins/needle sensation in feet.  Patient does not monitor blood glucose daily.  Risk factors: diabetes, hyperlipidemia.  Patient states her heels are dry and cracked. She has applied CeraVe Lotion with no improvement.   [name], MD is patient's PCP. Last visit was 08/21/2021.  Past Medical History:  Diagnosis Date   CHF (congestive heart failure) ([place])    Coronary artery disease    Diabetes mellitus without complication ([place])    High cholesterol    Hypertension    Psoriasis    Patient Active Problem List   Diagnosis Date Noted   Morbid obesity ([place]) 08/29/2021   Neuropathy 08/29/2021   Other thrombophilia ([place]) 08/29/2021   Postmenopausal bleeding 08/29/2021   Recurrent falls 08/29/2021   Vitamin D deficiency 08/29/2021   Abnormal feces 02/08/2021   Age-related osteoporosis without current pathological fracture 02/08/2021   Cognitive impairment 02/08/2021   Colon cancer screening 02/08/2021   Diabetic peripheral neuropathy ([place]) 02/08/2021   Hearing loss 02/08/2021   Hyperlipidemia 02/08/2021   HFrEF (heart failure with reduced ejection fraction) ([place]) 01/30/2021   Secondary hypercoagulable state ([place]) 06/16/2020   Atrial fibrillation ([place]) 06/06/2020   Persistent atrial fibrillation (HCC)    Acute systolic heart failure ([place])    Atrial fibrillation with rapid ventricular response ([place]) 04/01/2020   Atrial fibrillation with RVR ([place]) 04/01/2020   Psoriasis 04/01/2020   Constipation 04/01/2020   Essential hypertension 04/01/2020   Diabetes mellitus type II, non insulin dependent ([place]) 04/01/2020   Dyspnea  04/01/2020   Past Surgical History:  Procedure Laterality Date   AV NODE ABLATION N/A 07/18/2020   Procedure: AV NODE ABLATION;  Surgeon: Vickie Epley, MD;  Location: [facility];  Service: Cardiovascular;  Laterality: N/A;   BIV ICD INSERTION CRT-D N/A 06/06/2020   Procedure: BIV ICD INSERTION CRT-D;  Surgeon: Vickie Epley, MD;  Location: [facility];  Service: Cardiovascular;  Laterality: N/A;   BREAST EXCISIONAL BIOPSY Right ? more than 30 years   BREAST SURGERY     CARDIOVERSION N/A 04/04/2020   Procedure: CARDIOVERSION;  Surgeon: Jerline Pain, MD;  Location[facility] END[facility]COPY;  Service: Cardiovascular;  Laterality: N/A;   CARDIOVERSION N/A 05/10/2020   Procedure: CARDIOVERSION;  Surgeon: [name], MD;  Location: [facility];  Service: Cardiovascular;  Laterality: N/A;   TEE WITHOUT CARDIOVERSION N/A 04/04/2020   Procedure: TRANSESOPHAGEAL ECHOCARDIOGRAM (TEE);  Surgeon: Jerline Pain, MD;  Location: [facility] ENDOSCOPY;  Service: Cardiovascular;  Laterality: N/A;   Current Outpatient Medications on File Prior to Visit  Medication Sig Dispense Refill   apixaban (ELIQUIS) 5 MG TABS tablet Take 1 tablet (5 mg total) by mouth 2 (two) times daily. 90 tablet 1   atorvastatin (LIPITOR) 80 MG tablet Take 80 mg by mouth at bedtime.      Cholecalciferol (VITAMIN D3) 50 MCG (2000 UT) TABS Take 2,000 Units by mouth daily.     empagliflozin (JARDIANCE) 10 MG TABS tablet Take 1 tablet (10 mg total) by mouth daily before breakfast. 30 tablet 2   ENTRESTO 24-26 MG TAKE 1 TABLET BY MOUTH TWICE A DAY 60 tablet 6   metFORMIN (GLUCOPHAGE) 500 MG tablet Take 500 mg  by mouth 2 (two) times daily with a meal.     Multiple Vitamin (MULTIVITAMIN WITH MINERALS) TABS tablet Take 1 tablet by mouth daily. Centrum     Multiple Vitamins-Minerals (PRESERVISION AREDS 2) CAPS Take 1 capsule by mouth daily.      STELARA 45 MG/0.5ML SOSY injection Inject into the skin.     No current  facility-administered medications on file prior to visit.    No Known Allergies Social History   Occupational History   Not on file  Tobacco Use   Smoking status: Former   Smokeless tobacco: Never  Vaping Use   Vaping Use: Never used  Substance and Sexual Activity   Alcohol use: No   Drug use: No   Sexual activity: Not on file   Family History  Problem Relation Age of Onset   Heart disease Mother        Required pacemaker in her 8s   Immunization History  Administered Date(s) Administered   PFIZER(Purple Top)SARS-COV-2 Vaccination 09/04/2019, 09/25/2019     Review of Systems: Negative except as noted in the HPI.   Objective: There were no vitals filed for this visit.  Melissa Bishop is a pleasant 76 y.o. female in NAD. AAO X 3.  Vascular Examination: CFT <3 seconds b/l LE. Palpable DP/PT pulses b/l LE. Digital hair sparse b/l. Skin temperature gradient WNL b/l. No pain with calf compression b/l. No edema noted b/l. No cyanosis or clubbing noted b/l LE.  Dermatological Examination: No open wounds b/l LE. No interdigital macerations noted b/l LE. Toenails 1-5 b/l elongated, discolored, dystrophic, thickened, crumbly with subungual debris and tenderness to dorsal palpation. Pedal skin dry and cracked bilateral heels.  Musculoskeletal Examination: Normal muscle strength 5/5 to all lower extremity muscle groups bilaterally. No pain, crepitus or joint limitation noted with ROM b/l LE. No gross bony pedal deformities b/l. Patient ambulates independently without assistive aids.  Footwear Assessment: Does the patient wear appropriate shoes? Yes. Does the patient need inserts/orthotics? No.  Neurological Examination: Protective sensation intact 5/5 intact bilaterally with 10g monofilament b/l. Vibratory sensation intact b/l.  ADA Risk Categorization: Low Risk :  Patient has all of the following: Intact protective sensation No prior foot ulcer  No severe deformity Pedal  pulses present  1. Pain due to onychomycosis of toenails of both feet   2. Xerosis cutis   3. Callus   4. Controlled type 2 diabetes mellitus without complication, without long-term current use of insulin ([place])   5. Encounter for diabetic foot exam ([place])     Plan: -Diabetic foot examination performed today. -Continue foot and shoe inspections daily. Monitor blood glucose per PCP/Endocrinologist's recommendations. -Toenails 1-5 bilaterally were debrided in length and girth with sterile nail nippers and dremel. Pinpoint bleeding of L hallux addressed with Lumicain Hemostatic Solution, cleansed with alcohol. triple antibiotic ointment applied. Patient instructed to apply Neosporin Cream once daily for 7 days. -Rx sent to pharmacy for triamcinolone ointment 0.1% to be applied to rough skin on heels once daily. -Patient/POA to call should there be question/concern in the interim.  Return in about 3 months (around 11/27/2021).  Marzetta Board, DPM

## 2021-08-30 NOTE — Progress Notes (Signed)
Cardiology Office Note:   Date:  08/31/2021  NAME:  Melissa Bishop    MRN: [id number] DOB:  [birth year]   PCP:  [name], MD  Cardiologist:  Evalina Field, MD  Electrophysiologist:  Vickie Epley, MD   Referring MD: [name], MD   Chief Complaint  Patient presents with   Follow-up    4-6 month   History of Present Illness:   Melissa Bishop is a 76 y.o. female with a hx of systolic HF with recovery of EF, Afib s/p AVN ablation, DM who presents for follow-up. EF has recovered.  She presents with her mother as well as a church member.  Seems to be doing quite well.  On lisinopril in addition to [place].  I have instructed her to stop taking lisinopril.  We need to check her kidney profile today.  I went over the results of her echocardiogram in the office which showed normalization of her LV function.  This is all reassuring.  She has no symptoms of congestive heart failure.  Denies any chest pain or trouble breathing.  No lower extremity edema reported.  Everything seems to be quite improved.  Mother is happy in office today.  Problem List  1. Atrial fibrillation, persistent -TEE/DCCV 04/05/2020 failed -converted to NSR on amiodarone  -recurrence on amiodarone  -s/p AVN ablation 07/18/2020 2. Systolic HF -EF 63-14% 9/70/2637 -EF 25-30% 10/21/2020 -arrhythmia related  -CRT-D 05/2020 -s/p AVN ablation 07/18/2020 -EF 60-65% 08/24/2021 3. Diabetes -A1c 6.1 4. HLD -T chol 209, HDL 48, LDL 137, triglycerides 133  Past Medical History: Past Medical History:  Diagnosis Date   CHF (congestive heart failure) (HCC)    Coronary artery disease    Diabetes mellitus without complication ([place])    High cholesterol    Hypertension    Psoriasis     Past Surgical History: Past Surgical History:  Procedure Laterality Date   AV NODE ABLATION N/A 07/18/2020   Procedure: AV NODE ABLATION;  Surgeon: Vickie Epley, MD;  Location: [facility];  Service: Cardiovascular;   Laterality: N/A;   BIV ICD INSERTION CRT-D N/A 06/06/2020   Procedure: BIV ICD INSERTION CRT-D;  Surgeon: Vickie Epley, MD;  Location: [facility];  Service: Cardiovascular;  Laterality: N/A;   BREAST EXCISIONAL BIOPSY Right ? more than 30 years   BREAST SURGERY     CARDIOVERSION N/A 04/04/2020   Procedure: CARDIOVERSION;  Surgeon: Jerline Pain, MD;  Location: [facility] ENDOSCOPY;  Service: Cardiovascular;  Laterality: N/A;   CARDIOVERSION N/A 05/10/2020   Procedure: CARDIOVERSION;  Surgeon: [name], MD;  Location: [facility];  Service: Cardiovascular;  Laterality: N/A;   TEE WITHOUT CARDIOVERSION N/A 04/04/2020   Procedure: TRANSESOPHAGEAL ECHOCARDIOGRAM (TEE);  Surgeon: Jerline Pain, MD;  Location: [facility] ENDOSCOPY;  Service: Cardiovascular;  Laterality: N/A;    Current Medications: Current Meds  Medication Sig   apixaban (ELIQUIS) 5 MG TABS tablet Take 1 tablet (5 mg total) by mouth 2 (two) times daily.   atorvastatin (LIPITOR) 80 MG tablet Take 80 mg by mouth at bedtime.    Cholecalciferol (VITAMIN D3) 50 MCG (2000 UT) TABS Take 2,000 Units by mouth daily.   empagliflozin (JARDIANCE) 10 MG TABS tablet Take 1 tablet (10 mg total) by mouth daily before breakfast.   ENTRESTO 24-26 MG TAKE 1 TABLET BY MOUTH TWICE A DAY   metFORMIN (GLUCOPHAGE) 500 MG tablet Take 500 mg by mouth 2 (two) times daily with a meal.  Multiple Vitamin (MULTIVITAMIN WITH MINERALS) TABS tablet Take 1 tablet by mouth daily. Centrum   Multiple Vitamins-Minerals (PRESERVISION AREDS 2) CAPS Take 1 capsule by mouth daily.    STELARA 45 MG/0.5ML SOSY injection Inject into the skin.   [DISCONTINUED] diltiazem (CARDIZEM) 120 MG tablet Take 120 mg by mouth daily.   [DISCONTINUED] lisinopril (ZESTRIL) 20 MG tablet Take 20 mg by mouth daily.     Allergies:    Patient has no known allergies.   Social History: Social History   Socioeconomic History   Marital status: Single    Spouse name: Not on file    Number of children: Not on file   Years of education: Not on file   Highest education level: Not on file  Occupational History   Not on file  Tobacco Use   Smoking status: Former   Smokeless tobacco: Never  Vaping Use   Vaping Use: Never used  Substance and Sexual Activity   Alcohol use: No   Drug use: No   Sexual activity: Not on file  Other Topics Concern   Not on file  Social History Narrative   Not on file   Social Determinants of Health   Financial Resource Strain: Not on file  Food Insecurity: Not on file  Transportation Needs: Not on file  Physical Activity: Not on file  Stress: Not on file  Social Connections: Not on file     Family History: The patient's family history includes Heart disease in her mother.  ROS:   All other ROS reviewed and negative. Pertinent positives noted in the HPI.     EKGs/Labs/Other Studies Reviewed:   The following studies were personally reviewed by me today:  TTE 08/24/2021  1. Left ventricular ejection fraction, by estimation, is 60 to 65%. The  left ventricle has normal function. The left ventricle has no regional  wall motion abnormalities. There is mild left ventricular hypertrophy.  Left ventricular diastolic parameters  were normal.   2. Right ventricular systolic function is normal. The right ventricular  size is normal.   3. Right atrial size was mildly dilated.   4. The mitral valve is grossly normal. Trivial mitral valve  regurgitation.   5. The aortic valve is normal in structure. Aortic valve regurgitation is  not visualized.   Recent Labs: 10/26/2020: BUN 19; Creatinine, Ser 0.94; Hemoglobin 15.6; Platelets 231; Potassium 4.2; Sodium 139   Recent Lipid Panel    Component Value Date/Time   CHOL 200 04/02/2020 0231   TRIG 104 04/02/2020 0231   HDL 37 (L) 04/02/2020 0231   CHOLHDL 5.4 04/02/2020 0231   VLDL 21 04/02/2020 0231   LDLCALC 142 (H) 04/02/2020 0231    Physical Exam:   VS:  BP 134/78    Pulse  74    Ht 5\' 11"  (1.803 m)    Wt 221 lb 3.2 oz (100.3 kg)    SpO2 98%    BMI 30.85 kg/m    Wt Readings from Last 3 Encounters:  08/31/21 221 lb 3.2 oz (100.3 kg)  05/08/21 219 lb 3.2 oz (99.4 kg)  05/04/21 219 lb (99.3 kg)    General: Well nourished, well developed, in no acute distress Head: Atraumatic, normal size  Eyes: PEERLA, EOMI  Neck: Supple, no JVD Endocrine: No thryomegaly Cardiac: Normal S1, S2; RRR; no murmurs, rubs, or gallops Lungs: Clear to auscultation bilaterally, no wheezing, rhonchi or rales  Abd: Soft, nontender, no hepatomegaly  Ext: No edema, pulses 2+ Musculoskeletal: No  deformities, BUE and BLE strength normal and equal Skin: Warm and dry, no rashes   Neuro: Alert and oriented to person, place, time, and situation, CNII-XII grossly intact, no focal deficits  Psych: Normal mood and affect   ASSESSMENT:   Tom Ragsdale is a 76 y.o. female who presents for the following: 1. Chronic systolic heart failure ([place])   2. Permanent atrial fibrillation ([place])   3. S/P AV nodal ablation   4. Acquired thrombophilia ([place])     PLAN:   1. Chronic systolic heart failure ([place]) -Diagnosed with systolic heart failure in August 2021.  This is in the setting of very difficult to control A. fib.  EF did not recover despite medical therapy.  She actually failed cardioversion as well as amiodarone therapy.  She is status post AV node ablation with CRT-D.  EF has now recovered. -She is on lisinopril and Entresto.  Instructed to stop lisinopril.  It is a contraindication to be on both.  We will check a BMP today.  She will continue on Entresto 24-26 mg twice daily.  She is on metoprolol succinate 150 mg daily.  She is on Aldactone 12.5 mg daily.  She is on Jardiance 10 mg daily.  BP well controlled.  Overall doing well.  On Lasix 40 mg daily.  No symptoms of congestive heart failure.  Everything is reassuring.  Her EF has recovered.  2. Permanent atrial fibrillation ([place]) 3. S/P AV  nodal ablation 4. Acquired thrombophilia ([place]) -Status post failed cardioversions.  Failed amiodarone.  Status post AV node ablation due to very difficult to control A. fib.  She is now with CRT-D.  We can stop her diltiazem.  She is on metoprolol.  She is on Eliquis 5 mg twice daily.  No bleeding issues.  Disposition: Return in about 6 months (around 02/28/2022).  Medication Adjustments/Labs and Tests Ordered: Current medicines are reviewed at length with the patient today.  Concerns regarding medicines are outlined above.  Orders Placed This Encounter  Procedures   Basic metabolic panel   No orders of the defined types were placed in this encounter.   Patient Instructions  Medication Instructions:  STOP Lisinopril STOP Cardizem   *If you need a refill on your cardiac medications before your next appointment, please call your pharmacy*   Lab Work: BMET today   If you have labs (blood work) drawn today and your tests are completely normal, you will receive your results only by: [place] (if you have MyChart) OR A paper copy in the mail If you have any lab test that is abnormal or we need to change your treatment, we will call you to review the results.   Follow-Up: At [facility], you and your health needs are our priority.  As part of our continuing mission to provide you with exceptional heart care, we have created designated Provider Care Teams.  These Care Teams include your primary Cardiologist (physician) and Advanced Practice Providers (APPs -  Physician Assistants and Nurse Practitioners) who all work together to provide you with the care you need, when you need it.  We recommend signing up for the patient portal called "MyChart".  Sign up information is provided on this After Visit Summary.  MyChart is used to connect with patients for Virtual Visits (Telemedicine).  Patients are able to view lab/test results, encounter notes, upcoming appointments, etc.   Non-urgent messages can be sent to your provider as well.   To learn more about what you  can do with MyChart, go to [email or web address].    Your next appointment:   6 month(s)  The format for your next appointment:   In Person  Provider:   Evalina Field, MD      Time Spent with Patient: I have spent a total of 35 minutes with patient reviewing hospital notes, telemetry, EKGs, labs and examining the patient as well as establishing an assessment and plan that was discussed with the patient.  > 50% of time was spent in direct patient care.  Signed, Addison Naegeli. Audie Box, MD, [place]  [address] [place], [place] [address] [phone]  08/31/2021 2:32 PM

## 2021-08-31 ENCOUNTER — Encounter: Payer: Self-pay | Admitting: Cardiovascular Disease

## 2021-08-31 ENCOUNTER — Ambulatory Visit (INDEPENDENT_AMBULATORY_CARE_PROVIDER_SITE_OTHER): Payer: Medicare Other | Admitting: Cardiovascular Disease

## 2021-08-31 ENCOUNTER — Other Ambulatory Visit: Payer: Self-pay

## 2021-08-31 VITALS — BP 134/78 | HR 74 | Ht 71.0 in | Wt 221.2 lb

## 2021-08-31 DIAGNOSIS — D6869 Other thrombophilia: Secondary | ICD-10-CM | POA: Diagnosis not present

## 2021-08-31 DIAGNOSIS — I5022 Chronic systolic (congestive) heart failure: Secondary | ICD-10-CM | POA: Diagnosis not present

## 2021-08-31 DIAGNOSIS — I4821 Permanent atrial fibrillation: Secondary | ICD-10-CM

## 2021-08-31 DIAGNOSIS — Z9889 Other specified postprocedural states: Secondary | ICD-10-CM

## 2021-08-31 NOTE — Patient Instructions (Signed)
Medication Instructions:  STOP Lisinopril STOP Cardizem   *If you need a refill on your cardiac medications before your next appointment, please call your pharmacy*   Lab Work: BMET today   If you have labs (blood work) drawn today and your tests are completely normal, you will receive your results only by: [place] (if you have MyChart) OR A paper copy in the mail If you have any lab test that is abnormal or we need to change your treatment, we will call you to review the results.   Follow-Up: At [facility], you and your health needs are our priority.  As part of our continuing mission to provide you with exceptional heart care, we have created designated Provider Care Teams.  These Care Teams include your primary Cardiologist (physician) and Advanced Practice Providers (APPs -  Physician Assistants and Nurse Practitioners) who all work together to provide you with the care you need, when you need it.  We recommend signing up for the patient portal called "MyChart".  Sign up information is provided on this After Visit Summary.  MyChart is used to connect with patients for Virtual Visits (Telemedicine).  Patients are able to view lab/test results, encounter notes, upcoming appointments, etc.  Non-urgent messages can be sent to your provider as well.   To learn more about what you can do with MyChart, go to [email or web address].    Your next appointment:   6 month(s)  The format for your next appointment:   In Person  Provider:   Evalina Field, MD

## 2021-09-01 ENCOUNTER — Other Ambulatory Visit: Payer: Self-pay | Admitting: Family Medicine

## 2021-09-01 DIAGNOSIS — M810 Age-related osteoporosis without current pathological fracture: Secondary | ICD-10-CM

## 2021-09-01 LAB — BASIC METABOLIC PANEL
BUN/Creatinine Ratio: 32 — ABNORMAL HIGH (ref 12–28)
BUN: 23 mg/dL (ref 8–27)
CO2: 22 mmol/L (ref 20–29)
Calcium: 9.5 mg/dL (ref 8.7–10.3)
Chloride: 105 mmol/L (ref 96–106)
Creatinine, Ser: 0.71 mg/dL (ref 0.57–1.00)
Glucose: 91 mg/dL (ref 70–99)
Potassium: 4.8 mmol/L (ref 3.5–5.2)
Sodium: 148 mmol/L — ABNORMAL HIGH (ref 134–144)
eGFR: 89 mL/min/{1.73_m2} (ref >59)

## 2021-09-04 ENCOUNTER — Telehealth: Payer: Self-pay | Admitting: Cardiovascular Disease

## 2021-09-04 ENCOUNTER — Other Ambulatory Visit: Payer: Self-pay | Admitting: Cardiovascular Disease

## 2021-09-04 NOTE — Telephone Encounter (Signed)
Spoke with pt's caregiver, Tim Lair (ok per [facility]) regarding medications that were not on her med list at office visit last Thursday (1/19).  Additions made to medication list. New copy of medication list printed and mailed to pt per Ms. Parson's request. Ms. Angelina Sheriff understanding.

## 2021-09-04 NOTE — Telephone Encounter (Signed)
Pts caregiver is calling to update pts medication list.. says that there are several that are missing and would like to add them.. medicines are listed below.. please advise:  Metoprolol 100 mg  1 1/2 pill in the morning Ezetimibe 10 mg 1 in the morning Spironolactone 25 mg 1/2 in the morning  Furosemide 40 mg 1 in the morning  Also requesting new med list be mailed to the address on file as well as callback verifying that these have been added to pts records

## 2021-09-07 ENCOUNTER — Ambulatory Visit (INDEPENDENT_AMBULATORY_CARE_PROVIDER_SITE_OTHER): Payer: Medicare Other

## 2021-09-07 DIAGNOSIS — I428 Other cardiomyopathies: Secondary | ICD-10-CM | POA: Diagnosis not present

## 2021-09-07 LAB — CUP PACEART REMOTE DEVICE CHECK
Battery Remaining Longevity: 87 mo
Battery Remaining Percentage: 83 %
Battery Voltage: 3.01 V
Date Time Interrogation Session: 20230126082240
HighPow Impedance: 99 Ohm
Implantable Lead Implant Date: 20211025
Implantable Lead Implant Date: 20211025
Implantable Lead Location: 753858
Implantable Lead Location: 753860
Implantable Pulse Generator Implant Date: 20211025
Lead Channel Impedance Value: 1325 Ohm
Lead Channel Impedance Value: 450 Ohm
Lead Channel Pacing Threshold Amplitude: 0.625 V
Lead Channel Pacing Threshold Amplitude: 0.875 V
Lead Channel Pacing Threshold Pulse Width: 0.5 ms
Lead Channel Pacing Threshold Pulse Width: 0.5 ms
Lead Channel Sensing Intrinsic Amplitude: 12 mV
Lead Channel Setting Pacing Amplitude: 1.375
Lead Channel Setting Pacing Amplitude: 1.625
Lead Channel Setting Pacing Pulse Width: 0.5 ms
Lead Channel Setting Pacing Pulse Width: 0.5 ms
Lead Channel Setting Sensing Sensitivity: 0.5 mV
Pulse Gen Serial Number: 810006964

## 2021-09-18 NOTE — Progress Notes (Signed)
Remote ICD transmission.   

## 2021-09-21 ENCOUNTER — Other Ambulatory Visit: Payer: Self-pay | Admitting: Cardiovascular Disease

## 2021-09-27 DIAGNOSIS — L400 Psoriasis vulgaris: Secondary | ICD-10-CM | POA: Diagnosis not present

## 2021-09-27 DIAGNOSIS — Z79899 Other long term (current) drug therapy: Secondary | ICD-10-CM | POA: Diagnosis not present

## 2021-10-15 ENCOUNTER — Other Ambulatory Visit: Payer: Self-pay | Admitting: Cardiovascular Disease

## 2021-10-30 DIAGNOSIS — Z961 Presence of intraocular lens: Secondary | ICD-10-CM | POA: Diagnosis not present

## 2021-10-30 DIAGNOSIS — E119 Type 2 diabetes mellitus without complications: Secondary | ICD-10-CM | POA: Diagnosis not present

## 2021-10-30 DIAGNOSIS — H35372 Puckering of macula, left eye: Secondary | ICD-10-CM | POA: Diagnosis not present

## 2021-11-10 DIAGNOSIS — Z20822 Contact with and (suspected) exposure to covid-19: Secondary | ICD-10-CM | POA: Diagnosis not present

## 2021-11-13 DIAGNOSIS — Z20822 Contact with and (suspected) exposure to covid-19: Secondary | ICD-10-CM | POA: Diagnosis not present

## 2021-11-14 DIAGNOSIS — Z20822 Contact with and (suspected) exposure to covid-19: Secondary | ICD-10-CM | POA: Diagnosis not present

## 2021-11-16 DIAGNOSIS — Z20822 Contact with and (suspected) exposure to covid-19: Secondary | ICD-10-CM | POA: Diagnosis not present

## 2021-11-29 ENCOUNTER — Ambulatory Visit (INDEPENDENT_AMBULATORY_CARE_PROVIDER_SITE_OTHER): Payer: Medicare Other | Admitting: Podiatry

## 2021-11-29 ENCOUNTER — Encounter: Payer: Self-pay | Admitting: Podiatry

## 2021-11-29 DIAGNOSIS — E119 Type 2 diabetes mellitus without complications: Secondary | ICD-10-CM | POA: Diagnosis not present

## 2021-11-29 DIAGNOSIS — B351 Tinea unguium: Secondary | ICD-10-CM | POA: Diagnosis not present

## 2021-11-29 DIAGNOSIS — M79675 Pain in left toe(s): Secondary | ICD-10-CM

## 2021-11-29 DIAGNOSIS — M79674 Pain in right toe(s): Secondary | ICD-10-CM

## 2021-12-07 ENCOUNTER — Ambulatory Visit (INDEPENDENT_AMBULATORY_CARE_PROVIDER_SITE_OTHER): Payer: Medicare Other

## 2021-12-07 DIAGNOSIS — I428 Other cardiomyopathies: Secondary | ICD-10-CM

## 2021-12-07 DIAGNOSIS — R2689 Other abnormalities of gait and mobility: Secondary | ICD-10-CM | POA: Diagnosis not present

## 2021-12-07 DIAGNOSIS — M25571 Pain in right ankle and joints of right foot: Secondary | ICD-10-CM | POA: Diagnosis not present

## 2021-12-07 DIAGNOSIS — M6281 Muscle weakness (generalized): Secondary | ICD-10-CM | POA: Diagnosis not present

## 2021-12-07 DIAGNOSIS — I5022 Chronic systolic (congestive) heart failure: Secondary | ICD-10-CM

## 2021-12-07 DIAGNOSIS — Z20822 Contact with and (suspected) exposure to covid-19: Secondary | ICD-10-CM | POA: Diagnosis not present

## 2021-12-07 LAB — CUP PACEART REMOTE DEVICE CHECK
Battery Remaining Longevity: 85 mo
Battery Remaining Longevity: 85 mo
Battery Remaining Percentage: 80 %
Battery Remaining Percentage: 80 %
Battery Voltage: 2.99 V
Battery Voltage: 2.99 V
Date Time Interrogation Session: 20230427062654
Date Time Interrogation Session: 20230427062842
HighPow Impedance: 100 Ohm
HighPow Impedance: 100 Ohm
Implantable Lead Implant Date: 20211025
Implantable Lead Implant Date: 20211025
Implantable Lead Implant Date: 20211025
Implantable Lead Implant Date: 20211025
Implantable Lead Location: 753858
Implantable Lead Location: 753858
Implantable Lead Location: 753860
Implantable Lead Location: 753860
Implantable Pulse Generator Implant Date: 20211025
Implantable Pulse Generator Implant Date: 20211025
Lead Channel Impedance Value: 1425 Ohm
Lead Channel Impedance Value: 1425 Ohm
Lead Channel Impedance Value: 480 Ohm
Lead Channel Impedance Value: 480 Ohm
Lead Channel Pacing Threshold Amplitude: 0.625 V
Lead Channel Pacing Threshold Amplitude: 0.625 V
Lead Channel Pacing Threshold Amplitude: 0.875 V
Lead Channel Pacing Threshold Amplitude: 0.875 V
Lead Channel Pacing Threshold Pulse Width: 0.5 ms
Lead Channel Pacing Threshold Pulse Width: 0.5 ms
Lead Channel Pacing Threshold Pulse Width: 0.5 ms
Lead Channel Pacing Threshold Pulse Width: 0.5 ms
Lead Channel Sensing Intrinsic Amplitude: 12 mV
Lead Channel Sensing Intrinsic Amplitude: 12 mV
Lead Channel Setting Pacing Amplitude: 1.375
Lead Channel Setting Pacing Amplitude: 1.375
Lead Channel Setting Pacing Amplitude: 1.625
Lead Channel Setting Pacing Amplitude: 1.625
Lead Channel Setting Pacing Pulse Width: 0.5 ms
Lead Channel Setting Pacing Pulse Width: 0.5 ms
Lead Channel Setting Pacing Pulse Width: 0.5 ms
Lead Channel Setting Pacing Pulse Width: 0.5 ms
Lead Channel Setting Sensing Sensitivity: 0.5 mV
Lead Channel Setting Sensing Sensitivity: 0.5 mV
Pulse Gen Serial Number: 810006964
Pulse Gen Serial Number: 810006964

## 2021-12-09 NOTE — Progress Notes (Signed)
?  Subjective:  ?Patient ID: Melissa Bishop, female    DOB: August 16, 1945,  MRN: [id number] ? ?Melissa Bishop presents to clinic today for preventative diabetic foot care and painful thick toenails that are difficult to trim. Pain interferes with ambulation. Aggravating factors include wearing enclosed shoe gear. Pain is relieved with periodic professional debridement. ? ?Patient did not check blood glucose today. ? ?Patient is accompanied by her caregiver, Melissa Bishop, on today's visit. ? ?New problem(s): None.  ? ?PCP is [name], MD , and last visit was September 20, 2021. ? ?No Known Allergies ? ?Review of Systems: Negative except as noted in the HPI. ? ?Objective: No changes noted in today's physical examination. ? ?Melissa Bishop is a pleasant 76 y.o. female in NAD. AAO X 3. ? ?Vascular Examination: ?CFT <3 seconds b/l LE. Palpable DP/PT pulses b/l LE. Digital hair sparse b/l. Skin temperature gradient WNL b/l. No pain with calf compression b/l. No edema noted b/l. No cyanosis or clubbing noted b/l LE. ? ?Dermatological Examination: ?No open wounds b/l LE. No interdigital macerations noted b/l LE. Toenails 1-5 b/l elongated, discolored, dystrophic, thickened, crumbly with subungual debris and tenderness to dorsal palpation. Pedal skin dry and cracked bilateral heels. ? ?Musculoskeletal Examination: ?Normal muscle strength 5/5 to all lower extremity muscle groups bilaterally. No pain, crepitus or joint limitation noted with ROM b/l LE. No gross bony pedal deformities b/l. Patient ambulates independently without assistive aids. ? ?Neurological Examination: ?Protective sensation intact 5/5 intact bilaterally with 10g monofilament b/l. Vibratory sensation intact b/l. ? ?Assessment/Plan: ?1. Pain due to onychomycosis of toenails of both feet   ?2. Controlled type 2 diabetes mellitus without complication, without long-term current use of insulin ([place])   ?-Patient was evaluated and treated. All patient's and/or POA's  questions/concerns answered on today's visit. ?-Patient to continue soft, supportive shoe gear daily. ?-Mycotic toenails 1-5 bilaterally were debrided in length and girth with sterile nail nippers and dremel without incident. ?-Patient/POA to call should there be question/concern in the interim.  ? ?Return in about 3 months (around 02/28/2022). ? ?Marzetta Board, DPM  ?

## 2021-12-11 DIAGNOSIS — Z20822 Contact with and (suspected) exposure to covid-19: Secondary | ICD-10-CM | POA: Diagnosis not present

## 2021-12-14 DIAGNOSIS — M6281 Muscle weakness (generalized): Secondary | ICD-10-CM | POA: Diagnosis not present

## 2021-12-14 DIAGNOSIS — R5381 Other malaise: Secondary | ICD-10-CM | POA: Diagnosis not present

## 2021-12-14 DIAGNOSIS — R2681 Unsteadiness on feet: Secondary | ICD-10-CM | POA: Diagnosis not present

## 2021-12-14 DIAGNOSIS — M25571 Pain in right ankle and joints of right foot: Secondary | ICD-10-CM | POA: Diagnosis not present

## 2021-12-21 DIAGNOSIS — M6281 Muscle weakness (generalized): Secondary | ICD-10-CM | POA: Diagnosis not present

## 2021-12-21 DIAGNOSIS — R2681 Unsteadiness on feet: Secondary | ICD-10-CM | POA: Diagnosis not present

## 2021-12-21 DIAGNOSIS — M25571 Pain in right ankle and joints of right foot: Secondary | ICD-10-CM | POA: Diagnosis not present

## 2021-12-21 DIAGNOSIS — R5381 Other malaise: Secondary | ICD-10-CM | POA: Diagnosis not present

## 2021-12-21 NOTE — Progress Notes (Signed)
Remote ICD transmission.   

## 2021-12-25 DIAGNOSIS — L400 Psoriasis vulgaris: Secondary | ICD-10-CM | POA: Diagnosis not present

## 2021-12-25 DIAGNOSIS — Z79899 Other long term (current) drug therapy: Secondary | ICD-10-CM | POA: Diagnosis not present

## 2021-12-26 DIAGNOSIS — R5381 Other malaise: Secondary | ICD-10-CM | POA: Diagnosis not present

## 2021-12-26 DIAGNOSIS — M6281 Muscle weakness (generalized): Secondary | ICD-10-CM | POA: Diagnosis not present

## 2021-12-26 DIAGNOSIS — M25571 Pain in right ankle and joints of right foot: Secondary | ICD-10-CM | POA: Diagnosis not present

## 2021-12-26 DIAGNOSIS — R2681 Unsteadiness on feet: Secondary | ICD-10-CM | POA: Diagnosis not present

## 2021-12-28 DIAGNOSIS — M25571 Pain in right ankle and joints of right foot: Secondary | ICD-10-CM | POA: Diagnosis not present

## 2021-12-28 DIAGNOSIS — R2681 Unsteadiness on feet: Secondary | ICD-10-CM | POA: Diagnosis not present

## 2021-12-28 DIAGNOSIS — M6281 Muscle weakness (generalized): Secondary | ICD-10-CM | POA: Diagnosis not present

## 2021-12-28 DIAGNOSIS — R5381 Other malaise: Secondary | ICD-10-CM | POA: Diagnosis not present

## 2022-01-01 DIAGNOSIS — R2681 Unsteadiness on feet: Secondary | ICD-10-CM | POA: Diagnosis not present

## 2022-01-01 DIAGNOSIS — M6281 Muscle weakness (generalized): Secondary | ICD-10-CM | POA: Diagnosis not present

## 2022-01-01 DIAGNOSIS — R5381 Other malaise: Secondary | ICD-10-CM | POA: Diagnosis not present

## 2022-01-01 DIAGNOSIS — M25571 Pain in right ankle and joints of right foot: Secondary | ICD-10-CM | POA: Diagnosis not present

## 2022-01-03 DIAGNOSIS — R5381 Other malaise: Secondary | ICD-10-CM | POA: Diagnosis not present

## 2022-01-03 DIAGNOSIS — M6281 Muscle weakness (generalized): Secondary | ICD-10-CM | POA: Diagnosis not present

## 2022-01-03 DIAGNOSIS — R2681 Unsteadiness on feet: Secondary | ICD-10-CM | POA: Diagnosis not present

## 2022-01-03 DIAGNOSIS — M25571 Pain in right ankle and joints of right foot: Secondary | ICD-10-CM | POA: Diagnosis not present

## 2022-01-09 DIAGNOSIS — M25571 Pain in right ankle and joints of right foot: Secondary | ICD-10-CM | POA: Diagnosis not present

## 2022-01-09 DIAGNOSIS — M6281 Muscle weakness (generalized): Secondary | ICD-10-CM | POA: Diagnosis not present

## 2022-01-09 DIAGNOSIS — R2681 Unsteadiness on feet: Secondary | ICD-10-CM | POA: Diagnosis not present

## 2022-01-09 DIAGNOSIS — R5381 Other malaise: Secondary | ICD-10-CM | POA: Diagnosis not present

## 2022-01-16 DIAGNOSIS — M25571 Pain in right ankle and joints of right foot: Secondary | ICD-10-CM | POA: Diagnosis not present

## 2022-01-16 DIAGNOSIS — R2681 Unsteadiness on feet: Secondary | ICD-10-CM | POA: Diagnosis not present

## 2022-01-16 DIAGNOSIS — R5381 Other malaise: Secondary | ICD-10-CM | POA: Diagnosis not present

## 2022-01-16 DIAGNOSIS — M6281 Muscle weakness (generalized): Secondary | ICD-10-CM | POA: Diagnosis not present

## 2022-01-17 ENCOUNTER — Other Ambulatory Visit: Payer: Self-pay | Admitting: Cardiovascular Disease

## 2022-01-23 DIAGNOSIS — R5381 Other malaise: Secondary | ICD-10-CM | POA: Diagnosis not present

## 2022-01-23 DIAGNOSIS — M6281 Muscle weakness (generalized): Secondary | ICD-10-CM | POA: Diagnosis not present

## 2022-01-23 DIAGNOSIS — M25571 Pain in right ankle and joints of right foot: Secondary | ICD-10-CM | POA: Diagnosis not present

## 2022-01-23 DIAGNOSIS — R2681 Unsteadiness on feet: Secondary | ICD-10-CM | POA: Diagnosis not present

## 2022-01-25 ENCOUNTER — Ambulatory Visit
Admission: RE | Admit: 2022-01-25 | Discharge: 2022-01-25 | Disposition: A | Discharge status: Home / Self Care | Payer: Medicare Other | Source: Ambulatory Visit | Attending: Family Medicine | Admitting: Family Medicine

## 2022-01-25 DIAGNOSIS — Z780 Asymptomatic menopausal state: Secondary | ICD-10-CM | POA: Diagnosis not present

## 2022-01-25 DIAGNOSIS — M810 Age-related osteoporosis without current pathological fracture: Secondary | ICD-10-CM | POA: Diagnosis not present

## 2022-02-06 ENCOUNTER — Other Ambulatory Visit: Payer: Self-pay | Admitting: Cardiovascular Disease

## 2022-03-01 DIAGNOSIS — Z79899 Other long term (current) drug therapy: Secondary | ICD-10-CM | POA: Diagnosis not present

## 2022-03-01 DIAGNOSIS — E785 Hyperlipidemia, unspecified: Secondary | ICD-10-CM | POA: Diagnosis not present

## 2022-03-01 DIAGNOSIS — Z23 Encounter for immunization: Secondary | ICD-10-CM | POA: Diagnosis not present

## 2022-03-01 DIAGNOSIS — R29898 Other symptoms and signs involving the musculoskeletal system: Secondary | ICD-10-CM | POA: Diagnosis not present

## 2022-03-01 DIAGNOSIS — I4821 Permanent atrial fibrillation: Secondary | ICD-10-CM | POA: Diagnosis not present

## 2022-03-01 DIAGNOSIS — I5022 Chronic systolic (congestive) heart failure: Secondary | ICD-10-CM | POA: Diagnosis not present

## 2022-03-01 DIAGNOSIS — G629 Polyneuropathy, unspecified: Secondary | ICD-10-CM | POA: Diagnosis not present

## 2022-03-01 DIAGNOSIS — I10 Essential (primary) hypertension: Secondary | ICD-10-CM | POA: Diagnosis not present

## 2022-03-01 DIAGNOSIS — L409 Psoriasis, unspecified: Secondary | ICD-10-CM | POA: Diagnosis not present

## 2022-03-01 DIAGNOSIS — M810 Age-related osteoporosis without current pathological fracture: Secondary | ICD-10-CM | POA: Diagnosis not present

## 2022-03-01 DIAGNOSIS — R2689 Other abnormalities of gait and mobility: Secondary | ICD-10-CM | POA: Diagnosis not present

## 2022-03-01 DIAGNOSIS — E1159 Type 2 diabetes mellitus with other circulatory complications: Secondary | ICD-10-CM | POA: Diagnosis not present

## 2022-03-02 DIAGNOSIS — M810 Age-related osteoporosis without current pathological fracture: Secondary | ICD-10-CM | POA: Diagnosis not present

## 2022-03-02 DIAGNOSIS — E1159 Type 2 diabetes mellitus with other circulatory complications: Secondary | ICD-10-CM | POA: Diagnosis not present

## 2022-03-02 DIAGNOSIS — I5022 Chronic systolic (congestive) heart failure: Secondary | ICD-10-CM | POA: Diagnosis not present

## 2022-03-02 DIAGNOSIS — F79 Unspecified intellectual disabilities: Secondary | ICD-10-CM | POA: Diagnosis not present

## 2022-03-02 DIAGNOSIS — E559 Vitamin D deficiency, unspecified: Secondary | ICD-10-CM | POA: Diagnosis not present

## 2022-03-02 DIAGNOSIS — E1142 Type 2 diabetes mellitus with diabetic polyneuropathy: Secondary | ICD-10-CM | POA: Diagnosis not present

## 2022-03-02 DIAGNOSIS — Z853 Personal history of malignant neoplasm of breast: Secondary | ICD-10-CM | POA: Diagnosis not present

## 2022-03-02 DIAGNOSIS — D6869 Other thrombophilia: Secondary | ICD-10-CM | POA: Diagnosis not present

## 2022-03-02 DIAGNOSIS — Z7901 Long term (current) use of anticoagulants: Secondary | ICD-10-CM | POA: Diagnosis not present

## 2022-03-02 DIAGNOSIS — E785 Hyperlipidemia, unspecified: Secondary | ICD-10-CM | POA: Diagnosis not present

## 2022-03-02 DIAGNOSIS — I110 Hypertensive heart disease with heart failure: Secondary | ICD-10-CM | POA: Diagnosis not present

## 2022-03-02 DIAGNOSIS — I4821 Permanent atrial fibrillation: Secondary | ICD-10-CM | POA: Diagnosis not present

## 2022-03-02 DIAGNOSIS — L409 Psoriasis, unspecified: Secondary | ICD-10-CM | POA: Diagnosis not present

## 2022-03-02 DIAGNOSIS — H9193 Unspecified hearing loss, bilateral: Secondary | ICD-10-CM | POA: Diagnosis not present

## 2022-03-02 DIAGNOSIS — Z7984 Long term (current) use of oral hypoglycemic drugs: Secondary | ICD-10-CM | POA: Diagnosis not present

## 2022-03-05 ENCOUNTER — Ambulatory Visit (INDEPENDENT_AMBULATORY_CARE_PROVIDER_SITE_OTHER): Payer: Medicare Other | Admitting: Podiatry

## 2022-03-05 DIAGNOSIS — B351 Tinea unguium: Secondary | ICD-10-CM | POA: Diagnosis not present

## 2022-03-05 DIAGNOSIS — M79675 Pain in left toe(s): Secondary | ICD-10-CM | POA: Diagnosis not present

## 2022-03-05 DIAGNOSIS — E119 Type 2 diabetes mellitus without complications: Secondary | ICD-10-CM | POA: Diagnosis not present

## 2022-03-05 DIAGNOSIS — M79674 Pain in right toe(s): Secondary | ICD-10-CM

## 2022-03-05 DIAGNOSIS — R269 Unspecified abnormalities of gait and mobility: Secondary | ICD-10-CM | POA: Insufficient documentation

## 2022-03-05 DIAGNOSIS — G629 Polyneuropathy, unspecified: Secondary | ICD-10-CM | POA: Insufficient documentation

## 2022-03-05 DIAGNOSIS — M6281 Muscle weakness (generalized): Secondary | ICD-10-CM | POA: Insufficient documentation

## 2022-03-07 DIAGNOSIS — I110 Hypertensive heart disease with heart failure: Secondary | ICD-10-CM | POA: Diagnosis not present

## 2022-03-07 DIAGNOSIS — M810 Age-related osteoporosis without current pathological fracture: Secondary | ICD-10-CM | POA: Diagnosis not present

## 2022-03-07 DIAGNOSIS — I5022 Chronic systolic (congestive) heart failure: Secondary | ICD-10-CM | POA: Diagnosis not present

## 2022-03-07 DIAGNOSIS — E1142 Type 2 diabetes mellitus with diabetic polyneuropathy: Secondary | ICD-10-CM | POA: Diagnosis not present

## 2022-03-07 DIAGNOSIS — I4821 Permanent atrial fibrillation: Secondary | ICD-10-CM | POA: Diagnosis not present

## 2022-03-07 DIAGNOSIS — E1159 Type 2 diabetes mellitus with other circulatory complications: Secondary | ICD-10-CM | POA: Diagnosis not present

## 2022-03-08 ENCOUNTER — Ambulatory Visit (INDEPENDENT_AMBULATORY_CARE_PROVIDER_SITE_OTHER): Payer: Medicare Other

## 2022-03-08 DIAGNOSIS — I428 Other cardiomyopathies: Secondary | ICD-10-CM

## 2022-03-08 LAB — CUP PACEART REMOTE DEVICE CHECK
Battery Remaining Longevity: 82 mo
Battery Remaining Percentage: 78 %
Battery Voltage: 2.99 V
Date Time Interrogation Session: 20230727020120
HighPow Impedance: 95 Ohm
Implantable Lead Implant Date: 20211025
Implantable Lead Implant Date: 20211025
Implantable Lead Location: 753858
Implantable Lead Location: 753860
Implantable Pulse Generator Implant Date: 20211025
Lead Channel Impedance Value: 1400 Ohm
Lead Channel Impedance Value: 460 Ohm
Lead Channel Pacing Threshold Amplitude: 0.625 V
Lead Channel Pacing Threshold Amplitude: 1.125 V
Lead Channel Pacing Threshold Pulse Width: 0.5 ms
Lead Channel Pacing Threshold Pulse Width: 0.5 ms
Lead Channel Sensing Intrinsic Amplitude: 12 mV
Lead Channel Setting Pacing Amplitude: 1.625
Lead Channel Setting Pacing Amplitude: 1.625
Lead Channel Setting Pacing Pulse Width: 0.5 ms
Lead Channel Setting Pacing Pulse Width: 0.5 ms
Lead Channel Setting Sensing Sensitivity: 0.5 mV
Pulse Gen Serial Number: 810006964

## 2022-03-11 NOTE — Progress Notes (Signed)
  Subjective:  Patient ID: Melissa Bishop, female    DOB: 08/31/1945,  MRN: [id number]  [name] presents to clinic today for preventative diabetic foot care and painful elongated mycotic toenails 1-5 bilaterally which are tender when wearing enclosed shoe gear. Pain is relieved with periodic professional debridement.  She is accompanied by her caregiver on today's visit. [name] states she has not been applying moisturizer to her feet daily.  New problem(s): None.   PCP is [name], MD , and last visit was  March 01, 2022  No Known Allergies  Review of Systems: Negative except as noted in the HPI.  Objective:  [name] is a pleasant 76 y.o. female in NAD. AAO X 3.  Vascular Examination: CFT <3 seconds b/l LE. Palpable DP/PT pulses b/l LE. Digital hair sparse b/l. Skin temperature gradient WNL b/l. No pain with calf compression b/l. No edema noted b/l. No cyanosis or clubbing noted b/l LE.  Dermatological Examination: No open wounds b/l LE. No interdigital macerations noted b/l LE. Toenails 1-5 b/l elongated, discolored, dystrophic, thickened, crumbly with subungual debris and tenderness to dorsal palpation. Pedal skin dry and cracked bilateral heels.  Musculoskeletal Examination: Normal muscle strength 5/5 to all lower extremity muscle groups bilaterally. No pain, crepitus or joint limitation noted with ROM b/l LE. No gross bony pedal deformities b/l. Patient ambulates independently without assistive aids.  Neurological Examination: Protective sensation intact 5/5 intact bilaterally with 10g monofilament b/l. Vibratory sensation intact b/l.  Assessment/Plan: 1. Pain due to onychomycosis of toenails of both feet   2. Controlled type 2 diabetes mellitus without complication, without long-term current use of insulin ([place])     -Examined patient. -Encouraged patient to apply moisturizer daily for dry skin. -Toenails 1-5 b/l were debrided in length and girth with sterile  nail nippers and dremel without iatrogenic bleeding.  -Patient/POA to call should there be question/concern in the interim.   Return in about 3 months (around 06/05/2022).  Marzetta Board, DPM

## 2022-03-12 DIAGNOSIS — M810 Age-related osteoporosis without current pathological fracture: Secondary | ICD-10-CM | POA: Diagnosis not present

## 2022-03-12 DIAGNOSIS — E1159 Type 2 diabetes mellitus with other circulatory complications: Secondary | ICD-10-CM | POA: Diagnosis not present

## 2022-03-12 DIAGNOSIS — I110 Hypertensive heart disease with heart failure: Secondary | ICD-10-CM | POA: Diagnosis not present

## 2022-03-12 DIAGNOSIS — E1142 Type 2 diabetes mellitus with diabetic polyneuropathy: Secondary | ICD-10-CM | POA: Diagnosis not present

## 2022-03-12 DIAGNOSIS — I5022 Chronic systolic (congestive) heart failure: Secondary | ICD-10-CM | POA: Diagnosis not present

## 2022-03-12 DIAGNOSIS — I4821 Permanent atrial fibrillation: Secondary | ICD-10-CM | POA: Diagnosis not present

## 2022-03-13 ENCOUNTER — Other Ambulatory Visit: Payer: Self-pay | Admitting: Cardiovascular Disease

## 2022-03-15 DIAGNOSIS — Z6834 Body mass index (BMI) 34.0-34.9, adult: Secondary | ICD-10-CM | POA: Diagnosis not present

## 2022-03-15 DIAGNOSIS — M79671 Pain in right foot: Secondary | ICD-10-CM | POA: Diagnosis not present

## 2022-03-15 DIAGNOSIS — I872 Venous insufficiency (chronic) (peripheral): Secondary | ICD-10-CM | POA: Diagnosis not present

## 2022-03-19 DIAGNOSIS — E1159 Type 2 diabetes mellitus with other circulatory complications: Secondary | ICD-10-CM | POA: Diagnosis not present

## 2022-03-19 DIAGNOSIS — I4821 Permanent atrial fibrillation: Secondary | ICD-10-CM | POA: Diagnosis not present

## 2022-03-19 DIAGNOSIS — I110 Hypertensive heart disease with heart failure: Secondary | ICD-10-CM | POA: Diagnosis not present

## 2022-03-19 DIAGNOSIS — I5022 Chronic systolic (congestive) heart failure: Secondary | ICD-10-CM | POA: Diagnosis not present

## 2022-03-19 DIAGNOSIS — E1142 Type 2 diabetes mellitus with diabetic polyneuropathy: Secondary | ICD-10-CM | POA: Diagnosis not present

## 2022-03-19 DIAGNOSIS — M810 Age-related osteoporosis without current pathological fracture: Secondary | ICD-10-CM | POA: Diagnosis not present

## 2022-03-23 ENCOUNTER — Observation Stay (HOSPITAL_COMMUNITY): Payer: Medicare Other

## 2022-03-23 ENCOUNTER — Emergency Department (HOSPITAL_COMMUNITY): Payer: Medicare Other

## 2022-03-23 ENCOUNTER — Observation Stay (HOSPITAL_COMMUNITY)
Admission: EM | Admit: 2022-03-23 | Discharge: 2022-03-26 | Disposition: A | Discharge status: Home Health | Payer: Medicare Other | Attending: Internal Medicine | Admitting: Internal Medicine

## 2022-03-23 DIAGNOSIS — I2510 Atherosclerotic heart disease of native coronary artery without angina pectoris: Secondary | ICD-10-CM | POA: Diagnosis not present

## 2022-03-23 DIAGNOSIS — I5020 Unspecified systolic (congestive) heart failure: Secondary | ICD-10-CM | POA: Diagnosis present

## 2022-03-23 DIAGNOSIS — I5022 Chronic systolic (congestive) heart failure: Secondary | ICD-10-CM | POA: Diagnosis not present

## 2022-03-23 DIAGNOSIS — Z87891 Personal history of nicotine dependence: Secondary | ICD-10-CM | POA: Diagnosis not present

## 2022-03-23 DIAGNOSIS — S299XXA Unspecified injury of thorax, initial encounter: Secondary | ICD-10-CM | POA: Diagnosis not present

## 2022-03-23 DIAGNOSIS — E119 Type 2 diabetes mellitus without complications: Secondary | ICD-10-CM | POA: Diagnosis not present

## 2022-03-23 DIAGNOSIS — R410 Disorientation, unspecified: Secondary | ICD-10-CM | POA: Diagnosis not present

## 2022-03-23 DIAGNOSIS — R55 Syncope and collapse: Secondary | ICD-10-CM | POA: Diagnosis not present

## 2022-03-23 DIAGNOSIS — Z9181 History of falling: Secondary | ICD-10-CM | POA: Insufficient documentation

## 2022-03-23 DIAGNOSIS — I4819 Other persistent atrial fibrillation: Secondary | ICD-10-CM | POA: Diagnosis present

## 2022-03-23 DIAGNOSIS — R2689 Other abnormalities of gait and mobility: Secondary | ICD-10-CM | POA: Diagnosis not present

## 2022-03-23 DIAGNOSIS — I110 Hypertensive heart disease with heart failure: Secondary | ICD-10-CM | POA: Insufficient documentation

## 2022-03-23 DIAGNOSIS — J341 Cyst and mucocele of nose and nasal sinus: Secondary | ICD-10-CM | POA: Diagnosis not present

## 2022-03-23 DIAGNOSIS — Z66 Do not resuscitate: Secondary | ICD-10-CM | POA: Insufficient documentation

## 2022-03-23 DIAGNOSIS — Z7984 Long term (current) use of oral hypoglycemic drugs: Secondary | ICD-10-CM | POA: Insufficient documentation

## 2022-03-23 DIAGNOSIS — M4312 Spondylolisthesis, cervical region: Secondary | ICD-10-CM | POA: Diagnosis not present

## 2022-03-23 DIAGNOSIS — I10 Essential (primary) hypertension: Secondary | ICD-10-CM | POA: Diagnosis present

## 2022-03-23 DIAGNOSIS — I213 ST elevation (STEMI) myocardial infarction of unspecified site: Secondary | ICD-10-CM | POA: Diagnosis not present

## 2022-03-23 DIAGNOSIS — I480 Paroxysmal atrial fibrillation: Secondary | ICD-10-CM | POA: Diagnosis not present

## 2022-03-23 DIAGNOSIS — Z79899 Other long term (current) drug therapy: Secondary | ICD-10-CM | POA: Insufficient documentation

## 2022-03-23 DIAGNOSIS — Z95810 Presence of automatic (implantable) cardiac defibrillator: Secondary | ICD-10-CM | POA: Diagnosis not present

## 2022-03-23 DIAGNOSIS — W19XXXA Unspecified fall, initial encounter: Secondary | ICD-10-CM | POA: Diagnosis not present

## 2022-03-23 DIAGNOSIS — E785 Hyperlipidemia, unspecified: Secondary | ICD-10-CM | POA: Diagnosis present

## 2022-03-23 DIAGNOSIS — E041 Nontoxic single thyroid nodule: Secondary | ICD-10-CM | POA: Insufficient documentation

## 2022-03-23 DIAGNOSIS — R41841 Cognitive communication deficit: Secondary | ICD-10-CM | POA: Insufficient documentation

## 2022-03-23 DIAGNOSIS — Z7901 Long term (current) use of anticoagulants: Secondary | ICD-10-CM | POA: Diagnosis not present

## 2022-03-23 DIAGNOSIS — S199XXA Unspecified injury of neck, initial encounter: Secondary | ICD-10-CM | POA: Diagnosis not present

## 2022-03-23 DIAGNOSIS — S0990XA Unspecified injury of head, initial encounter: Secondary | ICD-10-CM | POA: Diagnosis not present

## 2022-03-23 DIAGNOSIS — I517 Cardiomegaly: Secondary | ICD-10-CM | POA: Diagnosis not present

## 2022-03-23 DIAGNOSIS — R9389 Abnormal findings on diagnostic imaging of other specified body structures: Secondary | ICD-10-CM | POA: Diagnosis not present

## 2022-03-23 LAB — APTT: aPTT: 27 seconds (ref 24–36)

## 2022-03-23 LAB — BASIC METABOLIC PANEL
Anion gap: 7 (ref 5–15)
BUN: 20 mg/dL (ref 8–23)
CO2: 28 mmol/L (ref 22–32)
Calcium: 9.4 mg/dL (ref 8.9–10.3)
Chloride: 107 mmol/L (ref 98–111)
Creatinine, Ser: 0.82 mg/dL (ref 0.44–1.00)
GFR, Estimated: >60 mL/min (ref >60)
Glucose, Bld: 124 mg/dL — ABNORMAL HIGH (ref 70–99)
Potassium: 4.4 mmol/L (ref 3.5–5.1)
Sodium: 142 mmol/L (ref 135–145)

## 2022-03-23 LAB — HEMOGLOBIN A1C
Hgb A1c MFr Bld: 6.1 % — ABNORMAL HIGH (ref 4.8–5.6)
Mean Plasma Glucose: 128.37 mg/dL

## 2022-03-23 LAB — CBC WITH DIFFERENTIAL/PLATELET
Abs Immature Granulocytes: 0.02 10*3/uL (ref 0.00–0.07)
Basophils Absolute: 0 10*3/uL (ref 0.0–0.1)
Basophils Relative: 0 %
Eosinophils Absolute: 0.1 10*3/uL (ref 0.0–0.5)
Eosinophils Relative: 1 %
HCT: 48.6 % — ABNORMAL HIGH (ref 36.0–46.0)
Hemoglobin: 16.2 g/dL — ABNORMAL HIGH (ref 12.0–15.0)
Immature Granulocytes: 0 %
Lymphocytes Relative: 19 %
Lymphs Abs: 1.3 10*3/uL (ref 0.7–4.0)
MCH: 29.8 pg (ref 26.0–34.0)
MCHC: 33.3 g/dL (ref 30.0–36.0)
MCV: 89.3 fL (ref 80.0–100.0)
Monocytes Absolute: 0.3 10*3/uL (ref 0.1–1.0)
Monocytes Relative: 5 %
Neutro Abs: 4.8 10*3/uL (ref 1.7–7.7)
Neutrophils Relative %: 75 %
Platelets: 209 10*3/uL (ref 150–400)
RBC: 5.44 MIL/uL — ABNORMAL HIGH (ref 3.87–5.11)
RDW: 13.1 % (ref 11.5–15.5)
WBC: 6.5 10*3/uL (ref 4.0–10.5)
nRBC: 0 % (ref 0.0–0.2)

## 2022-03-23 LAB — TROPONIN I (HIGH SENSITIVITY)
Troponin I (High Sensitivity): 22 ng/L — ABNORMAL HIGH (ref <18)
Troponin I (High Sensitivity): 31 ng/L — ABNORMAL HIGH (ref <18)

## 2022-03-23 LAB — GLUCOSE, CAPILLARY: Glucose-Capillary: 118 mg/dL — ABNORMAL HIGH (ref 70–99)

## 2022-03-23 LAB — PROTIME-INR
INR: 1.1 (ref 0.8–1.2)
Prothrombin Time: 13.6 seconds (ref 11.4–15.2)

## 2022-03-23 LAB — CBG MONITORING, ED: Glucose-Capillary: 206 mg/dL — ABNORMAL HIGH (ref 70–99)

## 2022-03-23 LAB — MAGNESIUM: Magnesium: 2.1 mg/dL (ref 1.7–2.4)

## 2022-03-23 MED ORDER — ATORVASTATIN CALCIUM 80 MG PO TABS
80.0000 mg | ORAL_TABLET | Freq: Every day | ORAL | Status: DC
  Administered 2022-03-23 – 2022-03-25 (×3): 80 mg via ORAL
  Filled 2022-03-23 (×3): qty 1

## 2022-03-23 MED ORDER — INSULIN ASPART 100 UNIT/ML IJ SOLN
0.0000 [IU] | Freq: Three times a day (TID) | INTRAMUSCULAR | Status: DC
  Administered 2022-03-23: 5 [IU] via SUBCUTANEOUS
  Administered 2022-03-24 – 2022-03-25 (×5): 2 [IU] via SUBCUTANEOUS
  Administered 2022-03-26: 3 [IU] via SUBCUTANEOUS
  Administered 2022-03-26: 5 [IU] via SUBCUTANEOUS

## 2022-03-23 MED ORDER — INSULIN ASPART 100 UNIT/ML IJ SOLN: 0.0000 [IU] | Freq: Every day | INTRAMUSCULAR | Status: DC

## 2022-03-23 MED ORDER — SACUBITRIL-VALSARTAN 24-26 MG PO TABS
1.0000 | ORAL_TABLET | Freq: Two times a day (BID) | ORAL | Status: DC
  Administered 2022-03-23 – 2022-03-24 (×2): 1 via ORAL
  Filled 2022-03-23 (×2): qty 1

## 2022-03-23 MED ORDER — ONDANSETRON HCL 4 MG PO TABS: 4.0000 mg | ORAL_TABLET | Freq: Four times a day (QID) | ORAL | Status: DC | PRN

## 2022-03-23 MED ORDER — ACETAMINOPHEN 325 MG PO TABS
650.0000 mg | ORAL_TABLET | Freq: Four times a day (QID) | ORAL | Status: DC | PRN
  Administered 2022-03-23 – 2022-03-24 (×2): 650 mg via ORAL
  Filled 2022-03-23 (×2): qty 2

## 2022-03-23 MED ORDER — METOPROLOL SUCCINATE ER 100 MG PO TB24
150.0000 mg | ORAL_TABLET | Freq: Every day | ORAL | Status: DC
  Administered 2022-03-24: 150 mg via ORAL
  Filled 2022-03-23 (×2): qty 2

## 2022-03-23 MED ORDER — DILTIAZEM HCL ER COATED BEADS 120 MG PO CP24
120.0000 mg | ORAL_CAPSULE | Freq: Every day | ORAL | Status: DC
  Administered 2022-03-24: 120 mg via ORAL
  Filled 2022-03-23 (×2): qty 1

## 2022-03-23 MED ORDER — SPIRONOLACTONE 12.5 MG HALF TABLET
12.5000 mg | ORAL_TABLET | Freq: Every day | ORAL | Status: DC
  Administered 2022-03-24: 12.5 mg via ORAL
  Filled 2022-03-23 (×2): qty 1

## 2022-03-23 MED ORDER — APIXABAN 5 MG PO TABS
5.0000 mg | ORAL_TABLET | Freq: Two times a day (BID) | ORAL | Status: DC
  Administered 2022-03-23 – 2022-03-26 (×6): 5 mg via ORAL
  Filled 2022-03-23 (×6): qty 1

## 2022-03-23 MED ORDER — ONDANSETRON HCL 4 MG/2ML IJ SOLN: 4.0000 mg | Freq: Four times a day (QID) | INTRAMUSCULAR | Status: DC | PRN

## 2022-03-23 MED ORDER — EZETIMIBE 10 MG PO TABS
10.0000 mg | ORAL_TABLET | ORAL | Status: DC
  Administered 2022-03-24 – 2022-03-26 (×3): 10 mg via ORAL
  Filled 2022-03-23 (×3): qty 1

## 2022-03-23 MED ORDER — SODIUM CHLORIDE 0.9% FLUSH
3.0000 mL | Freq: Two times a day (BID) | INTRAVENOUS | Status: DC
  Administered 2022-03-23 – 2022-03-26 (×5): 3 mL via INTRAVENOUS

## 2022-03-23 MED ORDER — ACETAMINOPHEN 650 MG RE SUPP: 650.0000 mg | Freq: Four times a day (QID) | RECTAL | Status: DC | PRN

## 2022-03-23 NOTE — Procedures (Signed)
Patient Name: Melissa Bishop  MRN: [id number]  Epilepsy Attending: Lora Havens  Referring Physician/Provider: Karmen Bongo, MD  Date: 03/23/2022 Duration: 23.20 mins  Patient history: 76 yo F with syncope. EEG to evaluate for seizure  Level of alertness: Awake  AEDs during EEG study: None  Technical aspects: This EEG study was done with scalp electrodes positioned according to the 10-20 International system of electrode placement. Electrical activity was reviewed with band pass filter of 1-'70Hz'$ , sensitivity of 7 uV/mm, display speed of 42m/sec with a '60Hz'$  notched filter applied as appropriate. EEG data were recorded continuously and digitally stored.  Video monitoring was available and reviewed as appropriate.  Description: The posterior dominant rhythm consists of 8 Hz activity of moderate voltage (25-35 uV) seen predominantly in posterior head regions, symmetric and reactive to eye opening and eye closing. Physiologic photic driving was not seen during photic stimulation.  Hyperventilation was not performed.     IMPRESSION: This study is within normal limits. No seizures or epileptiform discharges were seen throughout the recording.  Melissa Bishop Melissa Bishop

## 2022-03-23 NOTE — H&P (Signed)
History and Physical    Patient: Melissa Bishop OEV:[id number] DOB: 06-30-1946 DOA: 03/23/2022 DOS: the patient was seen and examined on 03/23/2022 PCP: [name], MD  Patient coming from: Home - lives with mother; NOK: Mother, Zakya Halabi, [phone]; Caregiver, Tim Lair, [phone]    Chief Complaint: Fall  HPI: Melissa Bishop is a 76 y.o. female with medical history significant of DM, HTN, HLD, CAD, cognitive impairment/ID, afib on Eliquis, biventricular pacer, and chronic systolic CHF presenting with a fall.  She reports that she was petting her cat and tried to stand up and passed out.  She thinks she was unconscious for about 10 minutes and then was confused for maybe an hour after.  Her mother reports that she was pinned between the wall and the table and couldn't get up.  Her mother thinks she can make her own decisions but she doesn't usually want to.  She has no known h/o seizure.  Her mother confirms that she is DNR.    ER Course:  Syncope today.  EKG negative.  Interrogating pacer.  Trauma work up negative.    Review of Systems: As mentioned in the history of present illness. All other systems reviewed and are negative. Past Medical History:  Diagnosis Date   CHF (congestive heart failure) ([place])    Coronary artery disease    Diabetes mellitus without complication ([place])    High cholesterol    Hypertension    Psoriasis    Past Surgical History:  Procedure Laterality Date   AV NODE ABLATION N/A 07/18/2020   Procedure: AV NODE ABLATION;  Surgeon: Vickie Epley, MD;  Location: [place] [facility];  Service: Cardiovascular;  Laterality: N/A;   BIV ICD INSERTION CRT-D N/A 06/06/2020   Procedure: BIV ICD INSERTION CRT-D;  Surgeon: Vickie Epley, MD;  Location: [place] [facility];  Service: Cardiovascular;  Laterality: N/A;   BREAST EXCISIONAL BIOPSY Right ? more than 30 years   BREAST SURGERY     CARDIOVERSION N/A 04/04/2020   Procedure: CARDIOVERSION;   Surgeon: Jerline Pain, MD;  Location: [facility] ENDOSCOPY;  Service: Cardiovascular;  Laterality: N/A;   CARDIOVERSION N/A 05/10/2020   Procedure: CARDIOVERSION;  Surgeon: [name], MD;  Location: [place];  Service: Cardiovascular;  Laterality: N/A;   TEE WITHOUT CARDIOVERSION N/A 04/04/2020   Procedure: TRANSESOPHAGEAL ECHOCARDIOGRAM (TEE);  Surgeon: Jerline Pain, MD;  Location: [facility] ENDOSCOPY;  Service: Cardiovascular;  Laterality: N/A;   Social History:  reports that she has quit smoking. She has never used smokeless tobacco. She reports that she does not drink alcohol and does not use drugs.  No Known Allergies  Family History  Problem Relation Age of Onset   Heart disease Mother        Required pacemaker in her 29s    Prior to Admission medications   Medication Sig Start Date End Date Taking? Authorizing Provider  apixaban (ELIQUIS) 5 MG TABS tablet Take 1 tablet (5 mg total) by mouth 2 (two) times daily. 04/22/20   O'NealCassie Freer, MD  atorvastatin (LIPITOR) 80 MG tablet Take 80 mg by mouth at bedtime.  12/24/19   [provider]  Cholecalciferol (VITAMIN D3) 50 MCG (2000 UT) TABS Take 2,000 Units by mouth daily.    [provider]  diltiazem (CARDIZEM CD) 120 MG 24 hr capsule TAKE 1 CAPSULE BY MOUTH EVERY DAY 03/13/22   O'Neal, Cassie Freer, MD  empagliflozin (JARDIANCE) 10 MG TABS tablet TAKE 1 TABLET BY MOUTH EVERY  DAY BEFORE BREAKFAST 01/18/22   O'Neal, Cassie Freer, MD  ENTRESTO 24-26 MG TAKE 1 TABLET BY MOUTH TWICE A DAY 09/05/21   O'Neal, Cassie Freer, MD  ezetimibe (ZETIA) 10 MG tablet Take 10 mg by mouth every morning.    [provider]  furosemide (LASIX) 40 MG tablet TAKE 1 TABLET BY MOUTH EVERY DAY 02/06/22   O'Neal, Cassie Freer, MD  ketoconazole (NIZORAL) 2 % cream SMARTSIG:1 sparingly Topical Every Morning 09/27/21   [provider]  metFORMIN (GLUCOPHAGE) 500 MG tablet Take 500 mg by mouth 2 (two) times daily with a meal.     [provider]  metFORMIN (GLUCOPHAGE-XR) 750 MG 24 hr tablet Take 750 mg by mouth 2 (two) times daily. 11/03/21   [provider]  metoprolol succinate (TOPROL-XL) 100 MG 24 hr tablet Take 150 mg by mouth daily. Take with or immediately following a meal.    [provider]  Multiple Vitamin (MULTIVITAMIN WITH MINERALS) TABS tablet Take 1 tablet by mouth daily. Centrum    [provider]  Multiple Vitamins-Minerals (PRESERVISION AREDS 2) CAPS Take 1 capsule by mouth daily.     [provider]  spironolactone (ALDACTONE) 25 MG tablet TAKE 1/2 TABLET BY MOUTH EVERY DAY 10/16/21   O'Neal, Cassie Freer, MD  STELARA 45 MG/0.5ML SOSY injection Inject into the skin. 06/15/20   [provider]  tacrolimus (PROTOPIC) 0.1 % ointment SMARTSIG:1 sparingly Topical Every Night 09/27/21   [provider]    Physical Exam: Vitals:   03/23/22 1122 03/23/22 1141 03/23/22 1150 03/23/22 1200  BP:  (!) 173/90 (!) 140/80   Pulse:  74    Resp:  18    Temp:  97.9 F (36.6 C)    TempSrc:  Oral    SpO2:  97%  98%  Weight: 100.3 kg     Height: '5\' 11"'$  (1.803 m)      General:  Appears calm and comfortable and is in NAD; hard C-collar in place Eyes:  PERRL, EOMI, normal lids, iris ENT:  grossly normal hearing, lips & tongue, mmm Neck:  no LAD, masses or thyromegaly Cardiovascular:  RRR, no m/r/g. 1+ LE edema.  Respiratory:   CTA bilaterally with no wheezes/rales/rhonchi.  Normal respiratory effort. Abdomen:  soft, NT, ND Skin:  no rash or induration seen on limited exam; small hematoma with subtle abrasion along R forehead Musculoskeletal:  grossly normal tone BUE/BLE, good ROM, no bony abnormality Psychiatric:  blunted mood and affect, speech fluent and appropriate, AOx3, apparent mild cognitive delay Neurologic:  CN 2-12 grossly intact, moves all extremities in coordinated fashion   Radiological Exams on Admission: Independently reviewed - see  discussion in A/P where applicable  CT Head Wo Contrast  Result Date: 03/23/2022 CLINICAL DATA:  Head trauma, minor (Age >= 65y); Neck trauma (Age >= 65y) EXAM: CT HEAD WITHOUT CONTRAST CT CERVICAL SPINE WITHOUT CONTRAST TECHNIQUE: Multidetector CT imaging of the head and cervical spine was performed following the standard protocol without intravenous contrast. Multiplanar CT image reconstructions of the cervical spine were also generated. RADIATION DOSE REDUCTION: This exam was performed according to the departmental dose-optimization program which includes automated exposure control, adjustment of the mA and/or kV according to patient size and/or use of iterative reconstruction technique. COMPARISON:  None Available. FINDINGS: CT HEAD FINDINGS Brain: No evidence of acute infarction, hemorrhage, hydrocephalus, extra-axial collection or mass lesion/mass effect. Vascular: No hyperdense vessel identified. Calcific intracranial atherosclerosis. Skull: No acute fracture. Sinuses/Orbits: A few small  retention cysts. Otherwise, clear sinuses. Other: No mastoid effusions. CT CERVICAL SPINE FINDINGS Alignment: Straightening. Mild anterolisthesis of C3 on C4, likely degenerative in etiology given facet arthropathy at this level. Rotation of C1 on C2. Skull base and vertebrae: Vertebral body heights are maintained. No evidence of acute fracture. Soft tissues and spinal canal: No prevertebral fluid or swelling. No visible canal hematoma. Disc levels: Multilevel degenerative disc disease and facet/uncovertebral hypertrophy with varying degrees of neural foraminal stenosis. Upper chest: Visualized lung apices are clear. Other: Approximately 1.8 cm left thyroid nodule. IMPRESSION: 1. No evidence of acute intracranial abnormality. 2. No evidence of acute fracture in the cervical spine. 3. Rotation of C1 on C2, likely positional in the absence of a fixed torticollis. 4. Approximately 1.8 cm left thyroid nodule. Recommend  thyroid US (ref: J Am Coll Radiol. 2015 Feb;12(2): 143-50). Electronically Signed   By: Margaretha Sheffield M.D.   On: 03/23/2022 12:29   CT Cervical Spine Wo Contrast  Result Date: 03/23/2022 CLINICAL DATA:  Head trauma, minor (Age >= 65y); Neck trauma (Age >= 65y) EXAM: CT HEAD WITHOUT CONTRAST CT CERVICAL SPINE WITHOUT CONTRAST TECHNIQUE: Multidetector CT imaging of the head and cervical spine was performed following the standard protocol without intravenous contrast. Multiplanar CT image reconstructions of the cervical spine were also generated. RADIATION DOSE REDUCTION: This exam was performed according to the departmental dose-optimization program which includes automated exposure control, adjustment of the mA and/or kV according to patient size and/or use of iterative reconstruction technique. COMPARISON:  None Available. FINDINGS: CT HEAD FINDINGS Brain: No evidence of acute infarction, hemorrhage, hydrocephalus, extra-axial collection or mass lesion/mass effect. Vascular: No hyperdense vessel identified. Calcific intracranial atherosclerosis. Skull: No acute fracture. Sinuses/Orbits: A few small retention cysts. Otherwise, clear sinuses. Other: No mastoid effusions. CT CERVICAL SPINE FINDINGS Alignment: Straightening. Mild anterolisthesis of C3 on C4, likely degenerative in etiology given facet arthropathy at this level. Rotation of C1 on C2. Skull base and vertebrae: Vertebral body heights are maintained. No evidence of acute fracture. Soft tissues and spinal canal: No prevertebral fluid or swelling. No visible canal hematoma. Disc levels: Multilevel degenerative disc disease and facet/uncovertebral hypertrophy with varying degrees of neural foraminal stenosis. Upper chest: Visualized lung apices are clear. Other: Approximately 1.8 cm left thyroid nodule. IMPRESSION: 1. No evidence of acute intracranial abnormality. 2. No evidence of acute fracture in the cervical spine. 3. Rotation of C1 on C2, likely  positional in the absence of a fixed torticollis. 4. Approximately 1.8 cm left thyroid nodule. Recommend thyroid US (ref: J Am Coll Radiol. 2015 Feb;12(2): 143-50). Electronically Signed   By: Margaretha Sheffield M.D.   On: 03/23/2022 12:29   DG Chest Portable 1 View  Result Date: 03/23/2022 CLINICAL DATA:  Trauma, fall EXAM: PORTABLE CHEST 1 VIEW COMPARISON:  10/26/2020 FINDINGS: Transverse diameter of heart is increased. Shift of mediastinum to the right has not changed significantly. Pacemaker/defibrillator battery is seen in the left infraclavicular region. Biventricular pacer leads are noted. There are no signs of pulmonary edema or focal pulmonary consolidation. There is no pleural effusion or pneumothorax. IMPRESSION: Cardiomegaly. There are no signs of pulmonary edema or focal pulmonary consolidation. Electronically Signed   By: Elmer Picker M.D.   On: 03/23/2022 12:06    EKG: Independently reviewed.  NSR with rate 74; IVCD with LVH,NSCSLT   Labs on Admission: I have personally reviewed the available labs and imaging studies at the time of the admission.  Pertinent labs:    Glucose 124  HS troponin 22 WBC 6.5 INR 1.1   Assessment and Plan: Principal Problem:   Syncope and collapse Active Problems:   Essential hypertension   Diabetes mellitus type II, non insulin dependent (HCC)   Persistent atrial fibrillation (HCC)   HFrEF (heart failure with reduced ejection fraction) (HCC)   Hyperlipidemia   Solitary thyroid nodule   DNR (do not resuscitate)    Syncope -Patient describes passing out while trying to stand after feeding/petting her cat -Etiology is not clear. The differential diagnosis is broad, but most likely orthostatic/vasovagal syncope; seizure and arrhythmia are also considerations -She does not appear to have any significant traumatic injuries -Will observe overnight on telemetry -Orthostatic vital signs now and in AM -Trending troponins x 2 -Will check  EEG -Most recent echo was in January so will not plan to repeat at this time; pacer is being interrogated -Neuro checks  -PT/OT/ST (cognitive/language) eval and treat  Chronic systolic CHF -08/7614 echo showed normalization of the EF -No significant evidence of volume overload at this time -Continue Entresto, spironolactone -Has pacer/AICD  Afib -s/p AV nodal ablation -Rate controlled with diltiazem, Toprol XL -Has pacer -Continue Eliquis  HTN -Continue Diltiazem, Toprol XL, Entresto  DM -Will check A1c -hold Glucophage, Jardiance -Cover with moderate-scale SSI   HLD -Continue Lipitor, Zetia  Thyroid nodule -Appreciated incidentally on imaging -Needs outpatient [place]  DNR -I have discussed code status with the patient and her mother and they are in agreement that the patient would not desire resuscitation and would prefer to die a natural death should that situation arise. -She will need a gold out of facility DNR form at the time of discharge     Advance Care Planning:   Code Status: DNR   Consults: PT/OT/ST (cognitive/language); TOC team  DVT Prophylaxis: Eliquis  Family Communication: None present; I spoke with her mother by telephone at the time of admission  Severity of Illness: The appropriate patient status for this patient is OBSERVATION. Observation status is judged to be reasonable and necessary in order to provide the required intensity of service to ensure the patient's safety. The patient's presenting symptoms, physical exam findings, and initial radiographic and laboratory data in the context of their medical condition is felt to place them at decreased risk for further clinical deterioration. Furthermore, it is anticipated that the patient will be medically stable for discharge from the hospital within 2 midnights of admission.   Author: Karmen Bongo, MD 03/23/2022 1:47 PM  For on call review [email or web address].

## 2022-03-23 NOTE — ED Triage Notes (Signed)
Pt BIB EMS after fall on thinners. Fall happened at 8 am this morning. Pt hit the right side of her head. Pt states to EMS that she believes she may have LOC, as the last thing she remembers was petting her cat. Pt states she is having increased confusion, states it is harder to get words out and takes longer for her to think. EMS noted decreased sensation to her face. Pt is on elequis.   185/112 BP 70 HR 97-96% 212 sugar

## 2022-03-23 NOTE — ED Notes (Signed)
Pt at bedside

## 2022-03-23 NOTE — Progress Notes (Signed)
EEG complete - results pending 

## 2022-03-23 NOTE — ED Provider Notes (Signed)
[facility] EMERGENCY DEPARTMENT Provider Note   CSN: [id number] Arrival date & time: 03/23/22  1115     History  Chief Complaint  Patient presents with   Melissa Bishop    Melissa Bishop is a 76 y.o. female.  76 year old female with a history of nonischemic cardiomyopathy with recovered EF, atrial fibrillation status post ablation on Eliquis, cognitive impairment who presents to the emergency department after syncopal episode.  Patient states that she was on her knees playing with a cat when she blacked out.  She woke on the floor.  Denies any preceding symptoms.  Says that it was witnessed by her mother who did not notice any shaking and she denies any tongue bite or bowel or bladder incontinence.  No history of seizures in the past.  Says that after waking she had pain in the right side of her head.  Denies pain elsewhere.  Has not ambulated since the incident.  No weakness.  Says that the right side of her face feels numb where she has an abrasion.  No back pain.  Confirms that she still is on Eliquis.  Says that the last time that she passed out was several years ago and no recent illnesses or palpitations.  Unsure why she passed out previously.   Fall   Past Medical History:  Diagnosis Date   CHF (congestive heart failure) ([place])    Coronary artery disease    Diabetes mellitus without complication (HCC)    High cholesterol    Hypertension    Psoriasis        Home Medications Prior to Admission medications   Medication Sig Start Date End Date Taking? Authorizing Provider  apixaban (ELIQUIS) 5 MG TABS tablet Take 1 tablet (5 mg total) by mouth 2 (two) times daily. 04/22/20  Yes O'Neal, Cassie Freer, MD  atorvastatin (LIPITOR) 80 MG tablet Take 80 mg by mouth at bedtime.  12/24/19  Yes [provider]  Cholecalciferol (VITAMIN D3) 50 MCG (2000 UT) TABS Take 2,000 Units by mouth daily.   Yes [provider]  diltiazem (CARDIZEM CD) 120 MG 24 hr  capsule TAKE 1 CAPSULE BY MOUTH EVERY DAY Patient taking differently: Take 120 mg by mouth daily. 03/13/22  Yes O'Neal, Cassie Freer, MD  empagliflozin (JARDIANCE) 10 MG TABS tablet TAKE 1 TABLET BY MOUTH EVERY DAY BEFORE BREAKFAST Patient taking differently: Take 10 mg by mouth daily. 01/18/22  Yes O'Neal, Cassie Freer, MD  ENTRESTO 24-26 MG TAKE 1 TABLET BY MOUTH TWICE A DAY Patient taking differently: Take 1 tablet by mouth 2 (two) times daily. 09/05/21  Yes O'Neal, Cassie Freer, MD  ezetimibe (ZETIA) 10 MG tablet Take 10 mg by mouth daily.   Yes [provider]  furosemide (LASIX) 40 MG tablet TAKE 1 TABLET BY MOUTH EVERY DAY Patient taking differently: Take 40 mg by mouth in the morning. 02/06/22  Yes O'Neal, Cassie Freer, MD  metFORMIN (GLUCOPHAGE-XR) 750 MG 24 hr tablet Take 750 mg by mouth 2 (two) times daily with a meal. 11/03/21  Yes [provider]  metoprolol succinate (TOPROL-XL) 100 MG 24 hr tablet Take 150 mg by mouth daily.   Yes [provider]  Multiple Vitamins-Minerals (CENTRUM SILVER 50+WOMEN) TABS Take 1 tablet by mouth daily with breakfast.   Yes [provider]  Multiple Vitamins-Minerals (PRESERVISION AREDS 2) CAPS Take 1 capsule by mouth daily after breakfast.   Yes [provider]  spironolactone (ALDACTONE) 25 MG tablet TAKE 1/2 TABLET BY  MOUTH EVERY DAY Patient taking differently: Take 12.5 mg by mouth daily. 10/16/21  Yes O'Neal, Cassie Freer, MD  STELARA 45 MG/0.5ML SOSY injection Inject into the skin every 3 (three) months. 06/15/20  Yes [provider]      Allergies    Patient has no known allergies.    Review of Systems   Review of Systems  Physical Exam Updated Vital Signs BP (!) 169/100 (BP Location: Right Arm)   Pulse 73   Temp (!) 97.5 F (36.4 C)   Resp 18   Ht '5\' 11"'$  (1.803 m)   Wt 100.3 kg   SpO2 96%   BMI 30.84 kg/m  Physical Exam Vitals and nursing note reviewed.  Constitutional:       General: She is not in acute distress.    Appearance: She is well-developed.  HENT:     Head: Normocephalic.     Comments: Abrasion to right forehead no significant hematoma noted.    Right Ear: External ear normal.     Left Ear: External ear normal.     Nose: Nose normal.     Mouth/Throat:     Mouth: Mucous membranes are moist.     Pharynx: Oropharynx is clear.  Eyes:     Extraocular Movements: Extraocular movements intact.     Conjunctiva/sclera: Conjunctivae normal.     Pupils: Pupils are equal, round, and reactive to light.     Comments: Pupils 4 mm and reactive bilaterally  Neck:     Comments: Cervical collar in place Cardiovascular:     Rate and Rhythm: Normal rate and regular rhythm.     Pulses: Normal pulses.     Heart sounds: Normal heart sounds. No murmur heard. Pulmonary:     Effort: Pulmonary effort is normal. No respiratory distress.     Breath sounds: Normal breath sounds.  Abdominal:     General: Abdomen is flat. There is no distension.     Palpations: Abdomen is soft. There is no mass.     Tenderness: There is no abdominal tenderness. There is no guarding.  Musculoskeletal:        General: No swelling.     Right lower leg: Edema (1+) present.     Left lower leg: Edema (1+) present.     Comments: No tenderness to palpation over clavicles, chest wall, elbows, shoulders, hips, knees, or ankles.  Skin:    General: Skin is warm and dry.     Capillary Refill: Capillary refill takes less than 2 seconds.  Neurological:     Mental Status: She is alert and oriented to person, place, and time. Mental status is at baseline.     Comments: MENTAL STATUS: AAOx3 CRANIAL NERVES: II: Pupils equal and reactive 4 mm BL, no RAPD, no VF deficits III, IV, VI: EOM intact, no gaze preference or deviation, no nystagmus. V: Decreased sensation to light touch on right forehead where she has an abrasion VII: no facial weakness or asymmetry, no nasolabial fold flattening VIII: normal  hearing to speech and finger friction IX, X: normal palatal elevation, no uvular deviation XI: 5/5 head turn and 5/5 shoulder shrug bilaterally XII: midline tongue protrusion MOTOR: 5/5 strength in R shoulder flexion, elbow flexion and extension, and grip strength. 5/5 strength in L shoulder flexion, elbow flexion and extension, and grip strength.  5/5 strength in R hip and knee flexion, knee extension, ankle plantar and dorsiflexion. 5/5 strength in L hip and knee flexion, knee extension, ankle plantar and  dorsiflexion. SENSORY: Normal sensation to light touch in all extremities COORD: Normal finger to nose, no tremor, no dysmetria   Psychiatric:        Mood and Affect: Mood normal.     ED Results / Procedures / Treatments   Labs (all labs ordered are listed, but only abnormal results are displayed) Labs Reviewed  CBC WITH DIFFERENTIAL/PLATELET - Abnormal; Notable for the following components:      Result Value   RBC 5.44 (*)    Hemoglobin 16.2 (*)    HCT 48.6 (*)    All other components within normal limits  BASIC METABOLIC PANEL - Abnormal; Notable for the following components:   Glucose, Bld 124 (*)    All other components within normal limits  HEMOGLOBIN A1C - Abnormal; Notable for the following components:   Hgb A1c MFr Bld 6.1 (*)    All other components within normal limits  CBG MONITORING, ED - Abnormal; Notable for the following components:   Glucose-Capillary 206 (*)    All other components within normal limits  TROPONIN I (HIGH SENSITIVITY) - Abnormal; Notable for the following components:   Troponin I (High Sensitivity) 22 (*)    All other components within normal limits  TROPONIN I (HIGH SENSITIVITY) - Abnormal; Notable for the following components:   Troponin I (High Sensitivity) 31 (*)    All other components within normal limits  MAGNESIUM  PROTIME-INR  APTT  BASIC METABOLIC PANEL  CBC    EKG None  Radiology EEG adult  Result Date:  03/23/2022 Lora Havens, MD     03/23/2022  4:20 PM Patient Name: Melissa Bishop MRN: [id number] Epilepsy Attending: Lora Havens Referring Physician/Provider: Karmen Bongo, MD Date: 03/23/2022 Duration: 23.20 mins Patient history: 76 yo F with syncope. EEG to evaluate for seizure Level of alertness: Awake AEDs during EEG study: None Technical aspects: This EEG study was done with scalp electrodes positioned according to the 10-20 International system of electrode placement. Electrical activity was reviewed with band pass filter of 1-'70Hz'$ , sensitivity of 7 uV/mm, display speed of 43m/sec with a '60Hz'$  notched filter applied as appropriate. EEG data were recorded continuously and digitally stored.  Video monitoring was available and reviewed as appropriate. Description: The posterior dominant rhythm consists of 8 Hz activity of moderate voltage (25-35 uV) seen predominantly in posterior head regions, symmetric and reactive to eye opening and eye closing. Physiologic photic driving was not seen during photic stimulation.  Hyperventilation was not performed.   IMPRESSION: This study is within normal limits. No seizures or epileptiform discharges were seen throughout the recording. PLora Havens  CT Head Wo Contrast  Result Date: 03/23/2022 CLINICAL DATA:  Head trauma, minor (Age >= 65y); Neck trauma (Age >= 65y) EXAM: CT HEAD WITHOUT CONTRAST CT CERVICAL SPINE WITHOUT CONTRAST TECHNIQUE: Multidetector CT imaging of the head and cervical spine was performed following the standard protocol without intravenous contrast. Multiplanar CT image reconstructions of the cervical spine were also generated. RADIATION DOSE REDUCTION: This exam was performed according to the departmental dose-optimization program which includes automated exposure control, adjustment of the mA and/or kV according to patient size and/or use of iterative reconstruction technique. COMPARISON:  None Available. FINDINGS: CT HEAD FINDINGS  Brain: No evidence of acute infarction, hemorrhage, hydrocephalus, extra-axial collection or mass lesion/mass effect. Vascular: No hyperdense vessel identified. Calcific intracranial atherosclerosis. Skull: No acute fracture. Sinuses/Orbits: A few small retention cysts. Otherwise, clear sinuses. Other: No mastoid effusions. CT CERVICAL SPINE FINDINGS Alignment:  Straightening. Mild anterolisthesis of C3 on C4, likely degenerative in etiology given facet arthropathy at this level. Rotation of C1 on C2. Skull base and vertebrae: Vertebral body heights are maintained. No evidence of acute fracture. Soft tissues and spinal canal: No prevertebral fluid or swelling. No visible canal hematoma. Disc levels: Multilevel degenerative disc disease and facet/uncovertebral hypertrophy with varying degrees of neural foraminal stenosis. Upper chest: Visualized lung apices are clear. Other: Approximately 1.8 cm left thyroid nodule. IMPRESSION: 1. No evidence of acute intracranial abnormality. 2. No evidence of acute fracture in the cervical spine. 3. Rotation of C1 on C2, likely positional in the absence of a fixed torticollis. 4. Approximately 1.8 cm left thyroid nodule. Recommend thyroid US (ref: J Am Coll Radiol. 2015 Feb;12(2): 143-50). Electronically Signed   By: Margaretha Sheffield M.D.   On: 03/23/2022 12:29   CT Cervical Spine Wo Contrast  Result Date: 03/23/2022 CLINICAL DATA:  Head trauma, minor (Age >= 65y); Neck trauma (Age >= 65y) EXAM: CT HEAD WITHOUT CONTRAST CT CERVICAL SPINE WITHOUT CONTRAST TECHNIQUE: Multidetector CT imaging of the head and cervical spine was performed following the standard protocol without intravenous contrast. Multiplanar CT image reconstructions of the cervical spine were also generated. RADIATION DOSE REDUCTION: This exam was performed according to the departmental dose-optimization program which includes automated exposure control, adjustment of the mA and/or kV according to patient size  and/or use of iterative reconstruction technique. COMPARISON:  None Available. FINDINGS: CT HEAD FINDINGS Brain: No evidence of acute infarction, hemorrhage, hydrocephalus, extra-axial collection or mass lesion/mass effect. Vascular: No hyperdense vessel identified. Calcific intracranial atherosclerosis. Skull: No acute fracture. Sinuses/Orbits: A few small retention cysts. Otherwise, clear sinuses. Other: No mastoid effusions. CT CERVICAL SPINE FINDINGS Alignment: Straightening. Mild anterolisthesis of C3 on C4, likely degenerative in etiology given facet arthropathy at this level. Rotation of C1 on C2. Skull base and vertebrae: Vertebral body heights are maintained. No evidence of acute fracture. Soft tissues and spinal canal: No prevertebral fluid or swelling. No visible canal hematoma. Disc levels: Multilevel degenerative disc disease and facet/uncovertebral hypertrophy with varying degrees of neural foraminal stenosis. Upper chest: Visualized lung apices are clear. Other: Approximately 1.8 cm left thyroid nodule. IMPRESSION: 1. No evidence of acute intracranial abnormality. 2. No evidence of acute fracture in the cervical spine. 3. Rotation of C1 on C2, likely positional in the absence of a fixed torticollis. 4. Approximately 1.8 cm left thyroid nodule. Recommend thyroid US (ref: J Am Coll Radiol. 2015 Feb;12(2): 143-50). Electronically Signed   By: Margaretha Sheffield M.D.   On: 03/23/2022 12:29   DG Chest Portable 1 View  Result Date: 03/23/2022 CLINICAL DATA:  Trauma, fall EXAM: PORTABLE CHEST 1 VIEW COMPARISON:  10/26/2020 FINDINGS: Transverse diameter of heart is increased. Shift of mediastinum to the right has not changed significantly. Pacemaker/defibrillator battery is seen in the left infraclavicular region. Biventricular pacer leads are noted. There are no signs of pulmonary edema or focal pulmonary consolidation. There is no pleural effusion or pneumothorax. IMPRESSION: Cardiomegaly. There are no  signs of pulmonary edema or focal pulmonary consolidation. Electronically Signed   By: Elmer Picker M.D.   On: 03/23/2022 12:06    Procedures Procedures    Medications Ordered in ED Medications  atorvastatin (LIPITOR) tablet 80 mg (has no administration in time range)  diltiazem (CARDIZEM CD) 24 hr capsule 120 mg (has no administration in time range)  sacubitril-valsartan (ENTRESTO) 24-26 mg per tablet (has no administration in time range)  ezetimibe (ZETIA) tablet  10 mg (has no administration in time range)  metoprolol succinate (TOPROL-XL) 24 hr tablet 150 mg (has no administration in time range)  spironolactone (ALDACTONE) tablet 12.5 mg (has no administration in time range)  apixaban (ELIQUIS) tablet 5 mg (has no administration in time range)  insulin aspart (novoLOG) injection 0-15 Units (5 Units Subcutaneous Given 03/23/22 1643)  sodium chloride flush (NS) 0.9 % injection 3 mL (3 mLs Intravenous Given 03/23/22 1444)  acetaminophen (TYLENOL) tablet 650 mg (has no administration in time range)    Or  acetaminophen (TYLENOL) suppository 650 mg (has no administration in time range)  insulin aspart (novoLOG) injection 0-5 Units (has no administration in time range)  ondansetron (ZOFRAN) tablet 4 mg (has no administration in time range)    Or  ondansetron (ZOFRAN) injection 4 mg (has no administration in time range)    ED Course/ Medical Decision Making/ A&P                           Medical Decision Making 76 year old female with a history of nonischemic cardiomyopathy with recovered EF, atrial fibrillation status post ablation on Eliquis, cognitive impairment who presents to the emergency department after syncopal episode.  Given the lack of prodrome and the patient's cardiac history are highly concerning for cardiogenic cause of the patient's syncope.  Order was placed to have the patient's device interrogated which is pending at this time.  Patient had an EKG that did not show  any overt signs of arrhythmia or ischemia.  Troponin was sent was mildly elevated at 22 but patient not having ongoing symptoms of ischemia at this time so do not feel there is an indication for aspirin to be given.  In terms of the patient's trauma since she is on blood thinners and with her age she did have a CT of the head and C-spine obtained which did not show acute injury.  The patient c-collar was cleared.  She was then admitted to medicine for further management of her syncope. Of note the pt was also found to have a thyroid nodule on CT scan which will need to be followed-up as an outpatient.   Amount and/or Complexity of Data Reviewed Labs: ordered. Radiology: ordered.  Risk Decision regarding hospitalization.   Final Clinical Impression(s) / ED Diagnoses Final diagnoses:  Thyroid nodule  Syncope and collapse    Rx / DC Orders ED Discharge Orders     None         Fransico Meadow, MD 03/23/22 1739

## 2022-03-23 NOTE — Evaluation (Signed)
Occupational Therapy Evaluation Patient Details Name: Melissa Bishop MRN: [id number] DOB: 23-Aug-1945 Today's Date: 03/23/2022   History of Present Illness 76 y.o. female with medical history significant of DM, HTN, HLD, CAD, cognitive impairment/ID, afib on Eliquis, biventricular pacer, and chronic systolic CHF presenting with a fall.   Clinical Impression   Patient admitted for the diagnosis above.  PTA she lives with her mother, who is 69, and is unable to physically assist the patient.  The patient has a PCA for medication management, but generally does not need assist with ADL, mobility, and helps with caring for their cat and meal prep.  Patient is complaining of mild dizziness with mobility, and tenderness around her forehead, but is moving fairly well.  She is needing generalized supervision to [facility] for mobility and standing tasks.  OT can follow in the acute setting, but no post acute OT is anticipated.     BP back in the room 155/93     Recommendations for follow up therapy are one component of a multi-disciplinary discharge planning process, led by the attending physician.  Recommendations may be updated based on patient status, additional functional criteria and insurance authorization.   Follow Up Recommendations  No OT follow up    Assistance Recommended at Discharge Intermittent Supervision/Assistance  Patient can return home with the following Assist for transportation;Direct supervision/assist for medications management    Functional Status Assessment  Patient has had a recent decline in their functional status and demonstrates the ability to make significant improvements in function in a reasonable and predictable amount of time.  Equipment Recommendations  None recommended by OT    Recommendations for Other Services  PT Consult     Precautions / Restrictions Precautions Precautions: Fall Precaution Comments: Mild dizziness with mobility in the hall.  BP 155/93  once back in the room. Restrictions Weight Bearing Restrictions: No      Mobility Bed Mobility Overal bed mobility: Independent             General bed mobility comments: Increased time - forst time moving since she has been here    Transfers Overall transfer level: Needs assistance   Transfers: Sit to/from Stand Sit to Stand: Supervision                  Balance Overall balance assessment: Mild deficits observed, not formally tested                                         ADL either performed or assessed with clinical judgement   ADL Overall ADL's : Needs assistance/impaired                 Upper Body Dressing : Supervision/safety;Sitting   Lower Body Dressing: Min guard;Sit to/from stand   Toilet Transfer: Nature conservation officer;Ambulation                   Vision Baseline Vision/History: 1 Wears glasses Patient Visual Report: No change from baseline       Perception Perception Perception: Not tested   Praxis Praxis Praxis: Not tested    Pertinent Vitals/Pain Pain Assessment Pain Assessment: Faces Faces Pain Scale: Hurts little more Pain Location: forehead Pain Descriptors / Indicators: Sore Pain Intervention(s): Monitored during session     Hand Dominance Right   Extremity/Trunk Assessment Upper Extremity Assessment Upper Extremity Assessment: Overall WFL for tasks assessed  Lower Extremity Assessment Lower Extremity Assessment: Defer to PT evaluation   Cervical / Trunk Assessment Cervical / Trunk Assessment: Kyphotic;Other exceptions Cervical / Trunk Exceptions: ? torticollis   Communication Communication Communication: No difficulties   Cognition Arousal/Alertness: Awake/alert Behavior During Therapy: Flat affect Overall Cognitive Status: History of cognitive impairments - at baseline                                 General Comments: Alert and oriented, follows commands, demos  good safety.     General Comments       Exercises     Shoulder Instructions      Home Living Family/patient expects to be discharged to:: Private residence Living Arrangements: Parent Available Help at Discharge: Family;Personal care attendant Type of Home: House Home Access: Ramped entrance     [place]: One level     Bathroom Shower/Tub: Occupational psychologist: Standard Bathroom Accessibility: Yes How Accessible: Accessible via walker Home Equipment: [place] (2 wheels);Hand held shower head;Grab bars - tub/shower;Shower seat          Prior Functioning/Environment Prior Level of Function : Needs assist             Mobility Comments: States she dose not use RW for mobility. ADLs Comments: Patient does not drive.  Completes her own self care.  Has a PCA 1x/wk for medication setup.  Helps her mother with cooking and caring for their cat.        OT Problem List: Impaired balance (sitting and/or standing)      OT Treatment/Interventions: Self-care/ADL training;Therapeutic activities;Patient/family education;Balance training    OT Goals(Current goals can be found in the care plan section) Acute Rehab OT Goals Patient Stated Goal: Hoping to go home tomorrow OT Goal Formulation: With patient Time For Goal Achievement: 04/06/22 Potential to Achieve Goals: Good ADL Goals Pt Will Perform Grooming: Independently;standing Pt Will Perform Lower Body Dressing: Independently;sit to/from stand Pt Will Transfer to Toilet: Independently;regular height toilet;ambulating  OT Frequency: Min 2X/week    Co-evaluation              AM-PAC OT "6 Clicks" Daily Activity     Outcome Measure Help from another person eating meals?: None Help from another person taking care of personal grooming?: A Little Help from another person toileting, which includes using toliet, bedpan, or urinal?: A Little Help from another person bathing (including washing,  rinsing, drying)?: A Little Help from another person to put on and taking off regular upper body clothing?: None Help from another person to put on and taking off regular lower body clothing?: A Little 6 Click Score: 20   End of Session Nurse Communication: Mobility status  Activity Tolerance: Patient tolerated treatment well Patient left: in bed;with call bell/phone within reach  OT Visit Diagnosis: Unsteadiness on feet (R26.81);Dizziness and giddiness (R42)                Time: 1540-1600 OT Time Calculation (min): 20 min Charges:  OT General Charges $OT Visit: 1 Visit OT Evaluation $OT Eval Moderate Complexity: 1 Mod  03/23/2022  RP, OTR/L  Acute Rehabilitation Services  Office:  [phone]   Metta Clines 03/23/2022, 4:13 PM

## 2022-03-24 DIAGNOSIS — R55 Syncope and collapse: Secondary | ICD-10-CM | POA: Diagnosis not present

## 2022-03-24 LAB — GLUCOSE, CAPILLARY
Glucose-Capillary: 116 mg/dL — ABNORMAL HIGH (ref 70–99)
Glucose-Capillary: 141 mg/dL — ABNORMAL HIGH (ref 70–99)
Glucose-Capillary: 128 mg/dL — ABNORMAL HIGH (ref 70–99)
Glucose-Capillary: 191 mg/dL — ABNORMAL HIGH (ref 70–99)

## 2022-03-24 LAB — BASIC METABOLIC PANEL
Anion gap: 9 (ref 5–15)
BUN: 23 mg/dL (ref 8–23)
CO2: 24 mmol/L (ref 22–32)
Calcium: 9.5 mg/dL (ref 8.9–10.3)
Chloride: 107 mmol/L (ref 98–111)
Creatinine, Ser: 0.85 mg/dL (ref 0.44–1.00)
GFR, Estimated: >60 mL/min (ref >60)
Glucose, Bld: 118 mg/dL — ABNORMAL HIGH (ref 70–99)
Potassium: 4.3 mmol/L (ref 3.5–5.1)
Sodium: 140 mmol/L (ref 135–145)

## 2022-03-24 LAB — CBC
HCT: 48 % — ABNORMAL HIGH (ref 36.0–46.0)
Hemoglobin: 16.1 g/dL — ABNORMAL HIGH (ref 12.0–15.0)
MCH: 29.7 pg (ref 26.0–34.0)
MCHC: 33.5 g/dL (ref 30.0–36.0)
MCV: 88.6 fL (ref 80.0–100.0)
Platelets: 201 10*3/uL (ref 150–400)
RBC: 5.42 MIL/uL — ABNORMAL HIGH (ref 3.87–5.11)
RDW: 13 % (ref 11.5–15.5)
WBC: 6.1 10*3/uL (ref 4.0–10.5)
nRBC: 0 % (ref 0.0–0.2)

## 2022-03-24 MED ORDER — SODIUM CHLORIDE 0.9 % IV SOLN: INTRAVENOUS | Status: DC

## 2022-03-24 MED ORDER — ADULT MULTIVITAMIN W/MINERALS CH
1.0000 | ORAL_TABLET | Freq: Every day | ORAL | Status: DC
  Administered 2022-03-25 – 2022-03-26 (×2): 1 via ORAL
  Filled 2022-03-24 (×2): qty 1

## 2022-03-24 MED ORDER — SODIUM CHLORIDE 0.9 % IV SOLN
INTRAVENOUS | Status: AC
Start: 2022-03-24 — End: 2022-03-24

## 2022-03-24 MED ORDER — SACUBITRIL-VALSARTAN 24-26 MG PO TABS
1.0000 | ORAL_TABLET | Freq: Two times a day (BID) | ORAL | Status: DC
  Filled 2022-03-24: qty 1

## 2022-03-24 NOTE — Evaluation (Signed)
Speech Language Pathology Evaluation Patient Details Name: Melissa Bishop MRN: [id number] DOB: [birth year] Today's Date: 03/24/2022 Time: 3016-0109 SLP Time Calculation (min) (ACUTE ONLY): 25 min  Problem List:  Patient Active Problem List   Diagnosis Date Noted   Syncope and collapse 03/23/2022   Solitary thyroid nodule 03/23/2022   DNR (do not resuscitate) 03/23/2022   Abnormal gait 03/05/2022   Muscle weakness 03/05/2022   Peripheral polyneuropathy 03/05/2022   Morbid obesity ([place]) 08/29/2021   Neuropathy 08/29/2021   Other thrombophilia ([place]) 08/29/2021   Postmenopausal bleeding 08/29/2021   Recurrent falls 08/29/2021   Vitamin D deficiency 08/29/2021   Abnormal feces 02/08/2021   Age-related osteoporosis without current pathological fracture 02/08/2021   Cognitive impairment 02/08/2021   Colon cancer screening 02/08/2021   Diabetic peripheral neuropathy ([place]) 02/08/2021   Hearing loss 02/08/2021   Hyperlipidemia 02/08/2021   HFrEF (heart failure with reduced ejection fraction) ([place]) 01/30/2021   Secondary hypercoagulable state ([place]) 06/16/2020   Atrial fibrillation ([place]) 06/06/2020   Persistent atrial fibrillation (H[facility])    Acute systolic heart failure ([place])    Atrial fibrillation with rapid ventricular response ([place]) 04/01/2020   Atrial fibrillation with RVR ([place]) 04/01/2020   Psoriasis 04/01/2020   Constipation 04/01/2020   Essential hypertension 04/01/2020   Diabetes mellitus type II, non insulin dependent ([place]) 04/01/2020   Dyspnea 04/01/2020   Past Medical History:  Past Medical History:  Diagnosis Date   CHF (congestive heart failure) ([place])    Coronary artery disease    Diabetes mellitus without complication ([place])    High cholesterol    Hypertension    Psoriasis    Past Surgical History:  Past Surgical History:  Procedure Laterality Date   AV NODE ABLATION N/A 07/18/2020   Procedure: AV NODE ABLATION;  Surgeon: Vickie Epley, MD;  Location: [facility];  Service: Cardiovascular;  Laterality: N/A;   BIV ICD INSERTION CRT-D N/A 06/06/2020   Procedure: BIV ICD INSERTION CRT-D;  Surgeon: Vickie Epley, MD;  Location: [facility];  Service: Cardiovascular;  Laterality: N/A;   BREAST EXCISIONAL BIOPSY Right ? more than 30 years   BREAST SURGERY     CARDIOVERSION N/A 04/04/2020   Procedure: CARDIOVERSION;  Surgeon: Jerline Pain, MD;  Location: [facility] ENDOS[facility];  Service: Cardiovascular;  Laterality: N/A;   CARDIOVERSION N/A 05/10/2020   Procedure: CARDIOVERSION;  Surgeon: [name], MD;  Location: [place] [facility];  Service: Cardiovascular;  Laterality: N/A;   TEE WITHOUT CARDIOVERSION N/A 04/04/2020   Procedure: TRANSESOPHAGEAL ECHOCARDIOGRAM (TEE);  Surgeon: Jerline Pain, MD;  Location: [facility] END[facility]COPY;  Service: Cardiovascular;  Laterality: N/A;   HPI:  Melissa Bishop is a 76 y.o. female with medical history significant of DM, HTN, HLD, CAD, cognitive impairment/ID, afib on Eliquis, biventricular pacer, and chronic systolic CHF presenting with a fall.  She reports that she was petting her cat and tried to stand up and passed out.  She thinks she was unconscious for about 10 minutes and then was confused for maybe an hour after.  Her mother reports that she was pinned between the wall and the table and couldn't get up.  Her mother thinks she can make her own decisions but she doesn't usually want to.  She has no known h/o seizure.   Assessment / Plan / Recommendation Clinical Impression  SLUMS *[place] Mental Status Exam* administered with pt with her scoring 18/30 points - with strengths in areas of orientation, memory of words, attention, and  recall of narrative story.  Areas of difficulty included math, clock drawing *did numbers predominantly on the right side.  She is able to articulate need to call for assistance. She advises she has assist available to help her daily with her medications as needed.  Given her  baseline ID, suspect scoring/findings are consisent with her baseline and she appears to have support needed for home. SLP advised she have her family/friends help her with medications, appointments, etc as needed.  Thanks for this consult.     SLP Assessment  SLP Recommendation/Assessment: Patient does not need any further Speech [place] Pathology Services SLP Visit Diagnosis: Cognitive communication deficit (R41.841)    Recommendations for follow up therapy are one component of a multi-disciplinary discharge planning process, led by the attending physician.  Recommendations may be updated based on patient status, additional functional criteria and insurance authorization.    Follow Up Recommendations  No SLP follow up    Assistance Recommended at Discharge  Intermittent Supervision/Assistance  Functional Status Assessment Patient has not had a recent decline in their functional status  Frequency and Duration     N/a      SLP Evaluation Cognition  Overall Cognitive Status: No family/caregiver present to determine baseline cognitive functioning (but appears functional for her supportive home environment) Orientation Level: Oriented X4 Year: 2023 Month: August Day of Week: Correct Attention: Sustained Sustained Attention: Appears intact Memory: Appears intact (recalled 4/5 words independently) Awareness: Appears intact Problem Solving: Appears intact Safety/Judgment: Appears intact       Comprehension  Auditory Comprehension Overall Auditory Comprehension: Appears within functional limits for tasks assessed Yes/No Questions: Not tested Commands: Within Functional Limits Conversation: Complex Visual Recognition/Discrimination Discrimination: Exceptions to [facility] Reading Comprehension Reading Status: Not tested (pt did not have glasses)    Expression Expression Primary Mode of Expression: Verbal Verbal Expression Overall Verbal Expression: Appears within functional limits  for tasks assessed Initiation: No impairment Repetition: No impairment Naming: Not tested Non-Verbal Means of Communication: Not applicable Written Expression Dominant Hand: Right   Oral / Motor  Oral Motor/Sensory Function Overall Oral Motor/Sensory Function: Within functional limits (slight lingual deviation to the right upon protrusion) Motor Speech Resonance: Within functional limits Articulation: Within functional limitis Intelligibility: Intelligible Motor Planning: Witnin functional limits Motor Speech Errors: Not applicable            Macario Golds 03/24/2022, 1:35 PM Kathleen Lime, MS [facility] SLP Acute Rehab Services Office [phone] Pager [phone]

## 2022-03-24 NOTE — Progress Notes (Signed)
Orthostatic vitals - Sitting

## 2022-03-24 NOTE — Plan of Care (Signed)
  Problem: Coping: Goal: Ability to adjust to condition or change in health will improve Outcome: Progressing   Problem: Fluid Volume: Goal: Ability to maintain a balanced intake and output will improve Outcome: Progressing   Problem: Health Behavior/Discharge Planning: Goal: Ability to manage health-related needs will improve Outcome: Progressing   

## 2022-03-24 NOTE — TOC Initial Note (Signed)
Transition of Care [facility]) - Initial/Assessment Note    Patient Details  Name: Melissa Bishop MRN: [id number] Date of Birth: February 03, 1946  [facility]) CM/SW Contact:    Bartholomew Crews, RN Phone Number: [phone] 03/24/2022, 2:07 PM  Clinical Narrative:                  Spoke with patient at the bedside to discuss post acute transition. PTA home with her mother who she is primary caregiver. She stated that her brother who is her legal guardian is coming to town from [place] and will be at the hospital today. RNCM left contact information. Patient stated that she has [employer] who assist with transportation to medical appointments. She also receives [facility] PT services from [employer]. Patient will need updated [place] orders/Face to Face at discharge. Consent received to contact patient's friend, Wyatt Mage. Spoke with Bailey Mech who expressed concern about patient and her mother living alone in the home. Discussed transportation home at discharge - patient stated that she will call a friend.   Expected Discharge Plan: [place] Barriers to Discharge: Continued Medical Work up   Patient Goals and CMS Choice Patient states their goals for this hospitalization and ongoing recovery are:: return home to care for her mother CMS Medicare.gov Compare Post Acute Care list provided to:: Patient Choice offered to / list presented to : Patient  Expected Discharge Plan and Services Expected Discharge Plan: [place] In-house Referral: NA Discharge Planning Services: CM Consult Post Acute Care Choice: Home Health                   DME Arranged: N/A DME Agency: NA       HH Arranged: NA HH Agency: NA        Prior Living Arrangements/Services   Lives with:: Self, Parents Patient language and need for interpreter reviewed:: Yes Do you feel safe going back to the place where you live?: Yes      Need for Family Participation in Patient Care: No (Comment)    Current home services: DME, Home PT (walker, cane) Criminal Activity/Legal Involvement Pertinent to Current Situation/Hospitalization: No - Comment as needed  Activities of Daily Living      Permission Sought/Granted Permission sought to share information with : Family Supports Permission granted to share information with : Yes, Verbal Permission Granted  Share Information with NAME: Wyatt Mage     Permission granted to share info w Relationship: friend  Permission granted to share info w Contact Information: [phone]  Emotional Assessment Appearance:: Appears stated age Attitude/Demeanor/Rapport: Engaged Affect (typically observed): Accepting Orientation: : Oriented to Self, Oriented to Place, Oriented to  Time, Oriented to Situation Alcohol / Substance Use: Not Applicable Psych Involvement: No (comment)  Admission diagnosis:  Syncope and collapse [R55] Thyroid nodule [E04.1] Patient Active Problem List   Diagnosis Date Noted   Syncope and collapse 03/23/2022   Solitary thyroid nodule 03/23/2022   DNR (do not resuscitate) 03/23/2022   Abnormal gait 03/05/2022   Muscle weakness 03/05/2022   Peripheral polyneuropathy 03/05/2022   Morbid obesity ([place]) 08/29/2021   Neuropathy 08/29/2021   Other thrombophilia ([place]) 08/29/2021   Postmenopausal bleeding 08/29/2021   Recurrent falls 08/29/2021   Vitamin D deficiency 08/29/2021   Abnormal feces 02/08/2021   Age-related osteoporosis without current pathological fracture 02/08/2021   Cognitive impairment 02/08/2021   Colon cancer screening 02/08/2021   Diabetic peripheral neuropathy ([place]) 02/08/2021   Hearing loss  02/08/2021   Hyperlipidemia 02/08/2021   HFrEF (heart failure with reduced ejection fraction) ([place]) 01/30/2021   Secondary hypercoagulable state ([place]) 06/16/2020   Atrial fibrillation ([place]) 06/06/2020   Persistent atrial fibrillation (HCC)    Acute systolic heart failure ([place])    Atrial fibrillation with  rapid ventricular response ([place]) 04/01/2020   Atrial fibrillation with RVR ([place]) 04/01/2020   Psoriasis 04/01/2020   Constipation 04/01/2020   Essential hypertension 04/01/2020   Diabetes mellitus type II, non insulin dependent ([place]) 04/01/2020   Dyspnea 04/01/2020   PCP:  [name], MD Pharmacy:   [id number]: [phone]: [phone]    Social Determinants of Health (SDOH) Interventions    Readmission Risk Interventions     No data to display

## 2022-03-24 NOTE — Evaluation (Signed)
Physical Therapy Evaluation Patient Details Name: Melissa Bishop MRN: [id number] DOB: [birth year] Today's Date: 03/24/2022  History of Present Illness  Pt is a 76 y.o. female with medical history significant of DM, HTN, HLD, CAD, cognitive impairment/ID, afib on Eliquis, biventricular pacer, and chronic systolic CHF presenting with a fall. She reports that she was petting her cat and tried to stand up and passed out. She thinks she was unconscious for about 10 minutes and then was confused for maybe an hour after. Her mother reports that she was pinned between the wall and the table and couldn't get up. Her mother thinks she can make her own decisions but she doesn't usually want to. She has no known h/o seizure. Her mother confirms that she is DNR.  Clinical Impression  Pt agreeable to physical therapy evaluation/treatment session. Pt's orthostatics negative during encounter and pt asymptomatic. Pt able to ambulate 200 feet with RW and CGA. Pt's cognitive impairment, recent fall, and physical therapy deficits place her at greater risk of future falls. Pt currently presents with functional limitations secondary to impairments listed in PT problem list. Pt to benefit from skilled, acute care physical therapy interventions to maximize her strength, balance, independence level, and quality of life.       Recommendations for follow up therapy are one component of a multi-disciplinary discharge planning process, led by the attending physician.  Recommendations may be updated based on patient status, additional functional criteria and insurance authorization.  Follow Up Recommendations Home health PT (recommended ALF to pt but pt does not appear interested)      Assistance Recommended at Discharge Intermittent Supervision/Assistance  Patient can return home with the following  A little help with walking and/or transfers;Assistance with cooking/housework;Help with stairs or ramp for entrance;Assist for  transportation;Direct supervision/assist for medications management;Direct supervision/assist for financial management    Equipment Recommendations None recommended by PT  Recommendations for Other Services       Functional Status Assessment Patient has had a recent decline in their functional status and demonstrates the ability to make significant improvements in function in a reasonable and predictable amount of time.     Precautions / Restrictions Precautions Precautions: Fall Precaution Comments: Monitor for dizziness/check orthostatics Restrictions Weight Bearing Restrictions: No      Mobility  Bed Mobility Overal bed mobility: Needs Assistance Bed Mobility: Supine to Sit     Supine to sit: Supervision     General bed mobility comments: Increased time required and bed rails utilized    Transfers Overall transfer level: Needs assistance Equipment used: Rolling walker (2 wheels) Transfers: Sit to/from Stand Sit to Stand: Min guard, Min assist           General transfer comment: Several sit <> stands performed on this date (from chair and bed including to obtain orthostatics). Pt mostly required CGA for safety but min assist required on first sit to stand from bed. Pt also required increased time and utilized momentum rocking strategy due to weakness.    Ambulation/Gait Ambulation/Gait assistance: Min guard Gait Distance (Feet): 200 Feet Assistive device: Rolling walker (2 wheels) Gait Pattern/deviations: Step-through pattern, Trunk flexed Gait velocity: dec Gait velocity interpretation: 1.31 - 2.62 ft/sec, indicative of limited community ambulator   General Gait Details: Pt with some unsteadiness but no LOB. Pt denied dizziness throughout. Min cues for safe navigation.  Stairs            Wheelchair Mobility    Modified Rankin (Stroke Patients Only)  Balance Overall balance assessment: Needs assistance, History of Falls   Sitting  balance-Leahy Scale: Fair     Standing balance support: Reliant on assistive device for balance, No upper extremity supported, Bilateral upper extremity supported Standing balance-Leahy Scale: Fair Standing balance comment: requires increased BOS when not utilizing RW                             Pertinent Vitals/Pain Pain Assessment Pain Assessment: 0-10 Pain Score: 0-No pain Pain Location: forehead Pain Descriptors / Indicators: Sore Pain Intervention(s): Limited activity within patient's tolerance, Monitored during session    Home Living Family/patient expects to be discharged to:: Private residence Living Arrangements: Parent (Lives with mother) Available Help at Discharge: Family;Available 24 hours/day (niece available to help intermittently) Type of Home: House Home Access: Ramped entrance       Home Layout: One level Home Equipment: Conservation officer, nature (2 wheels);Hand held shower head;Grab bars - tub/shower;Shower seat;Wheelchair - manual      Prior Function Prior Level of Function :  (does not drive and friend down street takes care of her errands)             Mobility Comments: Does not use AD. ADLs Comments: Pt independent with self-care. Pt helps her mom cook and take care of cat. Pt requires her neice to take care of her bills. Pt also needs assistance for medication management.     Hand Dominance   Dominant Hand: Right    Extremity/Trunk Assessment   Upper Extremity Assessment Upper Extremity Assessment: Overall WFL for tasks assessed    Lower Extremity Assessment Lower Extremity Assessment:  (Grossly 4-/5 major muscle groups B LE's)       Communication   Communication: No difficulties  Cognition Arousal/Alertness: Awake/alert Behavior During Therapy: Flat affect Overall Cognitive Status: History of cognitive impairments - at baseline                                 General Comments: Pt A and O x 4        General  Comments General comments (skin integrity, edema, etc.): supine BP 150/89, sitting 143/106, and standing 141/91; no HR compensations and pt asymptomatic    Exercises     Assessment/Plan    PT Assessment Patient needs continued PT services  PT Problem List Decreased strength;Decreased activity tolerance;Decreased balance;Decreased mobility;Decreased knowledge of use of DME;Pain;Decreased safety awareness;Decreased cognition       PT Treatment Interventions DME instruction;Gait training;Functional mobility training;Therapeutic activities;Therapeutic exercise;Balance training;Neuromuscular re-education;Cognitive remediation;Patient/family education    PT Goals (Current goals can be found in the Care Plan section)  Acute Rehab PT Goals Patient Stated Goal: to go home PT Goal Formulation: With patient Time For Goal Achievement: 03/31/22 Potential to Achieve Goals:  (fairly good)    Frequency Min 3X/week     Co-evaluation               AM-PAC PT "6 Clicks" Mobility  Outcome Measure Help needed turning from your back to your side while in a flat bed without using bedrails?: A Little Help needed moving from lying on your back to sitting on the side of a flat bed without using bedrails?: A Little Help needed moving to and from a bed to a chair (including a wheelchair)?: A Little Help needed standing up from a chair using your arms (e.g., wheelchair or bedside chair)?:  A Little Help needed to walk in hospital room?: A Little Help needed climbing 3-5 steps with a railing? : A Little 6 Click Score: 18    End of Session Equipment Utilized During Treatment: Gait belt Activity Tolerance: Patient tolerated treatment well Patient left: in chair;with call bell/phone within reach;with chair alarm set Nurse Communication: Mobility status;Other (comment) (pt needs) PT Visit Diagnosis: Other abnormalities of gait and mobility (R26.89);History of falling (Z91.81)    Time: 7005-2591 PT  Time Calculation (min) (ACUTE ONLY): 38 min   Charges:   PT Evaluation $PT Eval Low Complexity: 1 Low PT Treatments $Therapeutic Activity: 8-22 mins        Donna Bernard, PT   [facility] 03/24/2022, 11:16 AM

## 2022-03-24 NOTE — Progress Notes (Signed)
Orthostatic vitals lying

## 2022-03-24 NOTE — Care Management Obs Status (Signed)
[place] NOTIFICATION   Patient Details  Name: Melissa Bishop MRN: [id number] Date of Birth: 1946/05/08   Medicare Observation Status Notification Given:  Yes    Bartholomew Crews, RN 03/24/2022, 1:26 PM

## 2022-03-24 NOTE — Progress Notes (Signed)
PROGRESS NOTE    Melissa Bishop  YWV:[id number] DOB: Dec 15, 1945 DOA: 03/23/2022 PCP: [name], MD    Brief Narrative:  Melissa Bishop is a 76 y.o. female with medical history significant of DM, HTN, HLD, CAD, cognitive impairment/ID, afib on Eliquis, biventricular pacer, and chronic systolic CHF presenting with a fall.  She reports that she was petting her cat and tried to stand up and passed out.  She thinks she was unconscious for about 10 minutes and then was confused for maybe an hour after.  Her mother reports that she was pinned between the wall and the table and couldn't get up.  Her mother thinks she can make her own decisions but she doesn't usually want to.  She has no known h/o seizure.  Her mother confirms that she is DNR   Assessment and Plan: Syncope -Patient describes passing out while trying to stand after feeding/petting her cat -appears to be orthostatic/vasovagal syncope; seizure and arrhythmia are also considerations -She does not appear to have any significant traumatic injuries -repeat orthos after gentle IVF and holding of entresto -EEG normal -Most recent echo was in January so will not plan to repeat at this time; pacer is being interrogated -PT eval-- home health   Chronic systolic CHF -03/5461 echo showed normalization of the EF -No significant evidence of volume overload at this time -Continue Entresto, spironolactone -Has pacer/AICD- still awaiting interrogation   Afib -s/p AV nodal ablation -Rate controlled with diltiazem, Toprol XL -Has pacer- have asked for interrogation  -Continue Eliquis   HTN -Continue Diltiazem, Toprol XL -change entresto to AM   DM -hold Glucophage, Jardiance -Cover with moderate-scale SSI    HLD -Continue Lipitor, Zetia   Thyroid nodule -Appreciated incidentally on imaging -Needs outpatient [place]   DNR -confirmed upon admission  Obesity Estimated body mass index is 30.84 kg/m as calculated from the  following:   Height as of this encounter: '5\' 11"'$  (1.803 m).   Weight as of this encounter: 100.3 kg.   DVT prophylaxis: Place TED hose Start: 03/24/22 1010 apixaban (ELIQUIS) tablet 5 mg    Code Status: DNR Family Communication: called legal guardian Melissa Bishop- no answer  Disposition Plan:  Level of care: Telemetry Medical Status is: Observation The patient remains OBS appropriate and will d/c before 2 midnights.    Consultants:  none   Subjective: Feels fine  Objective: Vitals:   03/24/22 0519 03/24/22 0523 03/24/22 0527 03/24/22 0755  BP: (!) 181/101 (!) 183/97 (!) 145/112 (!) 168/89  Pulse: 73 73  73  Resp: '17 18  16  '$ Temp: 98.4 F (36.9 C) 98.3 F (36.8 C)  97.8 F (36.6 C)  TempSrc: Oral Oral  Oral  SpO2: 97% 95%  97%  Weight:      Height:       No intake or output data in the 24 hours ending 03/24/22 1156 Filed Weights   03/23/22 1122  Weight: 100.3 kg    Examination:   General: Appearance:    Obese female in no acute distress     Lungs:      respirations unlabored  Heart:    Normal heart rate.    MS:   All extremities are intact.    Neurologic:   Awake, alert       Data Reviewed: I have personally reviewed following labs and imaging studies  CBC: Recent Labs  Lab 03/23/22 1126 03/24/22 0037  WBC 6.5 6.1  NEUTROABS 4.8  --   HGB 16.2* 16.1*  HCT 48.6* 48.0*  MCV 89.3 88.6  PLT 209 417   Basic Metabolic Panel: Recent Labs  Lab April 07, 2022 1126 03/24/22 0037  NA 142 140  K 4.4 4.3  CL 107 107  CO2 28 24  GLUCOSE 124* 118*  BUN 20 23  CREATININE 0.82 0.85  CALCIUM 9.4 9.5  MG 2.1  --    GFR: Estimated Creatinine Clearance: 73.4 mL/min (by C-G formula based on SCr of 0.85 mg/dL). Liver Function Tests: No results for input(s): "AST", "ALT", "ALKPHOS", "BILITOT", "PROT", "ALBUMIN" in the last 168 hours. No results for input(s): "LIPASE", "AMYLASE" in the last 168 hours. No results for input(s): "AMMONIA" in the last 168  hours. Coagulation Profile: Recent Labs  Lab April 07, 2022 1126  INR 1.1   Cardiac Enzymes: No results for input(s): "CKTOTAL", "CKMB", "CKMBINDEX", "TROPONINI" in the last 168 hours. BNP (last 3 results) No results for input(s): "PROBNP" in the last 8760 hours. HbA1C: Recent Labs    2022-04-07 1455  HGBA1C 6.1*   CBG: Recent Labs  Lab Apr 07, 2022 1636 2022-04-07 2153 03/24/22 0811 03/24/22 1123  GLUCAP 206* 118* 116* 141*   Lipid Profile: No results for input(s): "CHOL", "HDL", "LDLCALC", "TRIG", "CHOLHDL", "LDLDIRECT" in the last 72 hours. Thyroid Function Tests: No results for input(s): "TSH", "T4TOTAL", "FREET4", "T3FREE", "THYROIDAB" in the last 72 hours. Anemia Panel: No results for input(s): "VITAMINB12", "FOLATE", "FERRITIN", "TIBC", "IRON", "RETICCTPCT" in the last 72 hours. Sepsis Labs: No results for input(s): "PROCALCITON", "LATICACIDVEN" in the last 168 hours.  No results found for this or any previous visit (from the past 240 hour(s)).       Radiology Studies: EEG adult  Result Date: 04/07/22 Lora Havens, MD     April 07, 2022  4:20 PM Patient Name: Melissa Bishop MRN: [id number] Epilepsy Attending: Lora Havens Referring Physician/Provider: Karmen Bongo, MD Date: 07-Apr-2022 Duration: 23.20 mins Patient history: 76 yo F with syncope. EEG to evaluate for seizure Level of alertness: Awake AEDs during EEG study: None Technical aspects: This EEG study was done with scalp electrodes positioned according to the 10-20 International system of electrode placement. Electrical activity was reviewed with band pass filter of 1-'70Hz'$ , sensitivity of 7 uV/mm, display speed of 17m/sec with a '60Hz'$  notched filter applied as appropriate. EEG data were recorded continuously and digitally stored.  Video monitoring was available and reviewed as appropriate. Description: The posterior dominant rhythm consists of 8 Hz activity of moderate voltage (25-35 uV) seen predominantly in  posterior head regions, symmetric and reactive to eye opening and eye closing. Physiologic photic driving was not seen during photic stimulation.  Hyperventilation was not performed.   IMPRESSION: This study is within normal limits. No seizures or epileptiform discharges were seen throughout the recording. PLora Havens  CT Head Wo Contrast  Result Date: 808-26-2023CLINICAL DATA:  Head trauma, minor (Age >= 65y); Neck trauma (Age >= 65y) EXAM: CT HEAD WITHOUT CONTRAST CT CERVICAL SPINE WITHOUT CONTRAST TECHNIQUE: Multidetector CT imaging of the head and cervical spine was performed following the standard protocol without intravenous contrast. Multiplanar CT image reconstructions of the cervical spine were also generated. RADIATION DOSE REDUCTION: This exam was performed according to the departmental dose-optimization program which includes automated exposure control, adjustment of the mA and/or kV according to patient size and/or use of iterative reconstruction technique. COMPARISON:  None Available. FINDINGS: CT HEAD FINDINGS Brain: No evidence of acute infarction, hemorrhage, hydrocephalus, extra-axial collection or mass lesion/mass effect. Vascular: No hyperdense vessel identified. Calcific intracranial atherosclerosis.  Skull: No acute fracture. Sinuses/Orbits: A few small retention cysts. Otherwise, clear sinuses. Other: No mastoid effusions. CT CERVICAL SPINE FINDINGS Alignment: Straightening. Mild anterolisthesis of C3 on C4, likely degenerative in etiology given facet arthropathy at this level. Rotation of C1 on C2. Skull base and vertebrae: Vertebral body heights are maintained. No evidence of acute fracture. Soft tissues and spinal canal: No prevertebral fluid or swelling. No visible canal hematoma. Disc levels: Multilevel degenerative disc disease and facet/uncovertebral hypertrophy with varying degrees of neural foraminal stenosis. Upper chest: Visualized lung apices are clear. Other:  Approximately 1.8 cm left thyroid nodule. IMPRESSION: 1. No evidence of acute intracranial abnormality. 2. No evidence of acute fracture in the cervical spine. 3. Rotation of C1 on C2, likely positional in the absence of a fixed torticollis. 4. Approximately 1.8 cm left thyroid nodule. Recommend thyroid US (ref: J Am Coll Radiol. 2015 Feb;12(2): 143-50). Electronically Signed   By: Margaretha Sheffield M.D.   On: 03/23/2022 12:29   CT Cervical Spine Wo Contrast  Result Date: 03/23/2022 CLINICAL DATA:  Head trauma, minor (Age >= 65y); Neck trauma (Age >= 65y) EXAM: CT HEAD WITHOUT CONTRAST CT CERVICAL SPINE WITHOUT CONTRAST TECHNIQUE: Multidetector CT imaging of the head and cervical spine was performed following the standard protocol without intravenous contrast. Multiplanar CT image reconstructions of the cervical spine were also generated. RADIATION DOSE REDUCTION: This exam was performed according to the departmental dose-optimization program which includes automated exposure control, adjustment of the mA and/or kV according to patient size and/or use of iterative reconstruction technique. COMPARISON:  None Available. FINDINGS: CT HEAD FINDINGS Brain: No evidence of acute infarction, hemorrhage, hydrocephalus, extra-axial collection or mass lesion/mass effect. Vascular: No hyperdense vessel identified. Calcific intracranial atherosclerosis. Skull: No acute fracture. Sinuses/Orbits: A few small retention cysts. Otherwise, clear sinuses. Other: No mastoid effusions. CT CERVICAL SPINE FINDINGS Alignment: Straightening. Mild anterolisthesis of C3 on C4, likely degenerative in etiology given facet arthropathy at this level. Rotation of C1 on C2. Skull base and vertebrae: Vertebral body heights are maintained. No evidence of acute fracture. Soft tissues and spinal canal: No prevertebral fluid or swelling. No visible canal hematoma. Disc levels: Multilevel degenerative disc disease and facet/uncovertebral hypertrophy  with varying degrees of neural foraminal stenosis. Upper chest: Visualized lung apices are clear. Other: Approximately 1.8 cm left thyroid nodule. IMPRESSION: 1. No evidence of acute intracranial abnormality. 2. No evidence of acute fracture in the cervical spine. 3. Rotation of C1 on C2, likely positional in the absence of a fixed torticollis. 4. Approximately 1.8 cm left thyroid nodule. Recommend thyroid US (ref: J Am Coll Radiol. 2015 Feb;12(2): 143-50). Electronically Signed   By: Margaretha Sheffield M.D.   On: 03/23/2022 12:29   DG Chest Portable 1 View  Result Date: 03/23/2022 CLINICAL DATA:  Trauma, fall EXAM: PORTABLE CHEST 1 VIEW COMPARISON:  10/26/2020 FINDINGS: Transverse diameter of heart is increased. Shift of mediastinum to the right has not changed significantly. Pacemaker/defibrillator battery is seen in the left infraclavicular region. Biventricular pacer leads are noted. There are no signs of pulmonary edema or focal pulmonary consolidation. There is no pleural effusion or pneumothorax. IMPRESSION: Cardiomegaly. There are no signs of pulmonary edema or focal pulmonary consolidation. Electronically Signed   By: Elmer Picker M.D.   On: 03/23/2022 12:06        Scheduled Meds:  apixaban  5 mg Oral BID   atorvastatin  80 mg Oral QHS   diltiazem  120 mg Oral Daily   ezetimibe  10 mg Oral BH-q7a   insulin aspart  0-15 Units Subcutaneous TID WC   insulin aspart  0-5 Units Subcutaneous QHS   metoprolol succinate  150 mg Oral Daily   [START ON 03/25/2022] multivitamin with minerals  1 tablet Oral Q breakfast   [START ON 03/25/2022] sacubitril-valsartan  1 tablet Oral BID   sodium chloride flush  3 mL Intravenous Q12H   spironolactone  12.5 mg Oral Daily   Continuous Infusions:  sodium chloride       LOS: 0 days    Time spent: 45 minutes spent on chart review, discussion with nursing staff, consultants, updating family and interview/physical exam; more than 50% of that time  was spent in counseling and/or coordination of care.    Geradine Girt, DO [facility] Available via Epic secure chat 7am-7pm After these hours, please refer to coverage provider listed on [email or web address] 03/24/2022, 11:56 AM

## 2022-03-24 NOTE — Progress Notes (Signed)
Orthostatic vitals - Standing

## 2022-03-25 DIAGNOSIS — R55 Syncope and collapse: Secondary | ICD-10-CM | POA: Diagnosis not present

## 2022-03-25 LAB — GLUCOSE, CAPILLARY
Glucose-Capillary: 145 mg/dL — ABNORMAL HIGH (ref 70–99)
Glucose-Capillary: 140 mg/dL — ABNORMAL HIGH (ref 70–99)
Glucose-Capillary: 122 mg/dL — ABNORMAL HIGH (ref 70–99)
Glucose-Capillary: 147 mg/dL — ABNORMAL HIGH (ref 70–99)

## 2022-03-25 MED ORDER — SODIUM CHLORIDE 0.9 % IV SOLN: INTRAVENOUS | Status: AC

## 2022-03-25 MED ORDER — SPIRONOLACTONE 12.5 MG HALF TABLET
12.5000 mg | ORAL_TABLET | Freq: Every day | ORAL | Status: DC
  Filled 2022-03-25: qty 1

## 2022-03-25 MED ORDER — METOPROLOL SUCCINATE ER 25 MG PO TB24
75.0000 mg | ORAL_TABLET | Freq: Every day | ORAL | Status: DC
  Administered 2022-03-25: 75 mg via ORAL
  Filled 2022-03-25: qty 3

## 2022-03-25 MED ORDER — METOPROLOL SUCCINATE ER 25 MG PO TB24
50.0000 mg | ORAL_TABLET | Freq: Every day | ORAL | Status: DC
  Administered 2022-03-26: 50 mg via ORAL
  Filled 2022-03-25: qty 2

## 2022-03-25 MED ORDER — SPIRONOLACTONE 12.5 MG HALF TABLET
12.5000 mg | ORAL_TABLET | Freq: Every day | ORAL | Status: DC
  Administered 2022-03-26: 12.5 mg via ORAL
  Filled 2022-03-25: qty 1

## 2022-03-25 NOTE — Progress Notes (Signed)
PROGRESS NOTE    Melissa Bishop  XHB:[id number] DOB: February 09, 1946 DOA: 03/23/2022 PCP: [name], MD    Brief Narrative:  Melissa Bishop is a 76 y.o. female with medical history significant of DM, HTN, HLD, CAD, cognitive impairment/ID, afib on Eliquis, biventricular pacer, and chronic systolic CHF presenting with a fall.  She reports that she was petting her cat and tried to stand up and passed out.  She thinks she was unconscious for about 10 minutes and then was confused for maybe an hour after.  Her mother reports that she was pinned between the wall and the table and couldn't get up.  Her mother thinks she can make her own decisions but she doesn't usually want to.  She has no known h/o seizure.  Her mother confirms that she is DNR   Assessment and Plan: Syncope -Patient describes passing out while trying to stand after feeding/petting her cat -appears to be orthostatic/vasovagal syncope -She does not appear to have any significant traumatic injuries -orthos positive, IVF and repeat orthos -EEG normal -Most recent echo was in January so will not plan to repeat at this time; pacer is being interrogated -discussed with cards: change metro to 50, spiro 12.5, d/c Cardizem, resume jardiance upon d/c, prn lasix vs lower dose -PT eval-- home health   Chronic systolic CHF -03/1016 echo showed normalization of the EF -No significant evidence of volume overload at this time -see above cards recs -Has pacer/AICD- still awaiting interrogation   Afib -s/p AV nodal ablation -Rate controlled with diltiazem, Toprol XL -Has pacer- have asked for interrogation  -Continue Eliquis   HTN -Continue Toprol XL at a lower dose   DM -hold Glucophage, Jardiance -Cover with moderate-scale SSI while in hospital   HLD -Continue Lipitor, Zetia   Thyroid nodule -Appreciated incidentally on imaging -Needs outpatient [place]   DNR -confirmed upon admission  Obesity Estimated body mass index is  30.84 kg/m as calculated from the following:   Height as of this encounter: '5\' 11"'$  (1.803 m).   Weight as of this encounter: 100.3 kg.    DVT prophylaxis: Place TED hose Start: 03/24/22 1010 apixaban (ELIQUIS) tablet 5 mg    Code Status: DNR Family Communication: brother at bedside  Disposition Plan:  Level of care: Telemetry Medical Status is: Observation The patient remains OBS appropriate and will d/c before 2 midnights.    Consultants:  none   Subjective: Wondering what she should eat for breakfast Was symptomatic with her orthostatics this AM  Objective: Vitals:   03/24/22 1637 03/24/22 1922 03/25/22 0645 03/25/22 0754  BP: (!) 131/97 (!) 136/90  113/77  Pulse: 73 73 72 72  Resp: '16 17 18 16  '$ Temp: 98.1 F (36.7 C) 97.7 F (36.5 C) 98 F (36.7 C) 97.7 F (36.5 C)  TempSrc: Oral Oral Oral Oral  SpO2: 97% 97% 95% 99%  Weight:      Height:        Intake/Output Summary (Last 24 hours) at 03/25/2022 1257 Last data filed at 03/25/2022 1226 Gross per 24 hour  Intake 780 ml  Output 600 ml  Net 180 ml   Filed Weights   03/23/22 1122  Weight: 100.3 kg    Examination:   General: Appearance:    Obese female in no acute distress     Lungs:      respirations unlabored  Heart:    Normal heart rate.    MS:   All extremities are intact.    Neurologic:  Awake, alert       Data Reviewed: I have personally reviewed following labs and imaging studies  CBC: Recent Labs  Lab April 11, 2022 1126 03/24/22 0037  WBC 6.5 6.1  NEUTROABS 4.8  --   HGB 16.2* 16.1*  HCT 48.6* 48.0*  MCV 89.3 88.6  PLT 209 527   Basic Metabolic Panel: Recent Labs  Lab 04/11/2022 1126 03/24/22 0037  NA 142 140  K 4.4 4.3  CL 107 107  CO2 28 24  GLUCOSE 124* 118*  BUN 20 23  CREATININE 0.82 0.85  CALCIUM 9.4 9.5  MG 2.1  --    GFR: Estimated Creatinine Clearance: 73.4 mL/min (by C-G formula based on SCr of 0.85 mg/dL). Liver Function Tests: No results for input(s):  "AST", "ALT", "ALKPHOS", "BILITOT", "PROT", "ALBUMIN" in the last 168 hours. No results for input(s): "LIPASE", "AMYLASE" in the last 168 hours. No results for input(s): "AMMONIA" in the last 168 hours. Coagulation Profile: Recent Labs  Lab 2022-04-11 1126  INR 1.1   Cardiac Enzymes: No results for input(s): "CKTOTAL", "CKMB", "CKMBINDEX", "TROPONINI" in the last 168 hours. BNP (last 3 results) No results for input(s): "PROBNP" in the last 8760 hours. HbA1C: Recent Labs    04/11/22 1455  HGBA1C 6.1*   CBG: Recent Labs  Lab 03/24/22 1123 03/24/22 1656 03/24/22 2013 03/25/22 0712 03/25/22 1146  GLUCAP 141* 128* 191* 145* 140*   Lipid Profile: No results for input(s): "CHOL", "HDL", "LDLCALC", "TRIG", "CHOLHDL", "LDLDIRECT" in the last 72 hours. Thyroid Function Tests: No results for input(s): "TSH", "T4TOTAL", "FREET4", "T3FREE", "THYROIDAB" in the last 72 hours. Anemia Panel: No results for input(s): "VITAMINB12", "FOLATE", "FERRITIN", "TIBC", "IRON", "RETICCTPCT" in the last 72 hours. Sepsis Labs: No results for input(s): "PROCALCITON", "LATICACIDVEN" in the last 168 hours.  No results found for this or any previous visit (from the past 240 hour(s)).       Radiology Studies: EEG adult  Result Date: 2022-04-11 Lora Havens, MD     11-Apr-2022  4:20 PM Patient Name: Melissa Bishop MRN: [id number] Epilepsy Attending: Lora Havens Referring Physician/Provider: Karmen Bongo, MD Date: 04-11-22 Duration: 23.20 mins Patient history: 76 yo F with syncope. EEG to evaluate for seizure Level of alertness: Awake AEDs during EEG study: None Technical aspects: This EEG study was done with scalp electrodes positioned according to the 10-20 International system of electrode placement. Electrical activity was reviewed with band pass filter of 1-'70Hz'$ , sensitivity of 7 uV/mm, display speed of 6m/sec with a '60Hz'$  notched filter applied as appropriate. EEG data were recorded  continuously and digitally stored.  Video monitoring was available and reviewed as appropriate. Description: The posterior dominant rhythm consists of 8 Hz activity of moderate voltage (25-35 uV) seen predominantly in posterior head regions, symmetric and reactive to eye opening and eye closing. Physiologic photic driving was not seen during photic stimulation.  Hyperventilation was not performed.   IMPRESSION: This study is within normal limits. No seizures or epileptiform discharges were seen throughout the recording. Priyanka OBarbra Sarks       Scheduled Meds:  apixaban  5 mg Oral BID   atorvastatin  80 mg Oral QHS   ezetimibe  10 mg Oral BH-q7a   insulin aspart  0-15 Units Subcutaneous TID WC   insulin aspart  0-5 Units Subcutaneous QHS   [START ON 03/26/2022] metoprolol succinate  50 mg Oral Daily   multivitamin with minerals  1 tablet Oral Q breakfast   sodium chloride flush  3  mL Intravenous Q12H   [START ON 03/26/2022] spironolactone  12.5 mg Oral Daily   Continuous Infusions:  sodium chloride 100 mL/hr at 03/25/22 0731     LOS: 0 days    Time spent: 45 minutes spent on chart review, discussion with nursing staff, consultants, updating family and interview/physical exam; more than 50% of that time was spent in counseling and/or coordination of care.    Geradine Girt, DO [facility] Available via Epic secure chat 7am-7pm After these hours, please refer to coverage provider listed on [email or web address] 03/25/2022, 12:57 PM

## 2022-03-25 NOTE — Progress Notes (Signed)
Pt returned back to bed. Bp rechecked and noted to be 105/63 with a MAP of 77. Pt says she still feels a little dizzy but not as severely as before

## 2022-03-25 NOTE — Progress Notes (Signed)
   03/25/22 0754  Vitals  Temp 97.7 F (36.5 C)  Temp Source Oral  BP 113/77  MAP (mmHg) 89  BP Location Right Arm  BP Method Automatic  Patient Position (if appropriate) Lying  Pulse Rate 72  Pulse Rate Source Dinamap  Resp 16  Level of Consciousness  Level of Consciousness Alert  MEWS COLOR  MEWS Score Color Green  Oxygen Therapy  SpO2 99 %  O2 Device Room Air  MEWS Score  MEWS Temp 0  MEWS Systolic 0  MEWS Pulse 0  MEWS RR 0  MEWS LOC 0  MEWS Score 0   Pt vitals much improved while resting in bed. Pt still reports feeling dizzy

## 2022-03-26 DIAGNOSIS — R55 Syncope and collapse: Secondary | ICD-10-CM | POA: Diagnosis not present

## 2022-03-26 LAB — CORTISOL-AM, BLOOD: Cortisol - AM: 8.3 ug/dL (ref 6.7–22.6)

## 2022-03-26 LAB — GLUCOSE, CAPILLARY
Glucose-Capillary: 169 mg/dL — ABNORMAL HIGH (ref 70–99)
Glucose-Capillary: 225 mg/dL — ABNORMAL HIGH (ref 70–99)

## 2022-03-26 MED ORDER — FUROSEMIDE 40 MG PO TABS
ORAL_TABLET | ORAL | 0 refills | Status: DC
Start: 2022-03-26 — End: 2022-08-09

## 2022-03-26 MED ORDER — METOPROLOL SUCCINATE ER 50 MG PO TB24: 50.0000 mg | ORAL_TABLET | Freq: Every day | ORAL | 0 refills | Status: AC

## 2022-03-26 NOTE — TOC Progression Note (Signed)
Transition of Care [facility]) - Progression Note    Patient Details  Name: Melissa Bishop MRN: [id number] Date of Birth: 1946/02/14  Transition of Care [facility]) CM/SW Contact  Jacalyn Lefevre, Edson Snowball, RN Phone Number: 03/26/2022, 10:07 AM  Clinical Narrative:     PT messaged for rollator walker, ordered with [employer]   Expected Discharge Plan: [place] Barriers to Discharge: Continued Medical Work up  Expected Discharge Plan and Services Expected Discharge Plan: [place] In-house Referral: NA Discharge Planning Services: CM Consult Post Acute Care Choice: Home Health                   DME Arranged: N/A DME Agency: NA       HH Arranged: NA HH Agency: NA         Social Determinants of Health (SDOH) Interventions    Readmission Risk Interventions     No data to display

## 2022-03-26 NOTE — TOC Progression Note (Signed)
Transition of Care [facility]) - Progression Note    Patient Details  Name: Melissa Bishop MRN: [id number] Date of Birth: 1945/10/20  [facility] of [facility]) CM/SW Contact  Eyana Stolze, Edson Snowball, RN Phone Number: 03/26/2022, 12:58 PM  Clinical Narrative:     Ordered rollator walker  Malachy Mood with [facility] aware discharge is planned for today   Expected Discharge Plan: [place] Barriers to Discharge: Continued Medical Work up  Expected Discharge Plan and Services Expected Discharge Plan: [place] In-house Referral: NA Discharge Planning Services: CM Consult Post Acute Care Choice: Home Health   Expected Discharge Date: 03/26/22               DME Arranged: N/A DME Agency: NA       HH Arranged: NA HH Agency: NA         Social Determinants of Health (SDOH) Interventions    Readmission Risk Interventions     No data to display

## 2022-03-26 NOTE — Progress Notes (Signed)
Physical Therapy Treatment Patient Details Name: Melissa Bishop MRN: [id number] DOB: 08-10-1946 Today's Date: 03/26/2022   History of Present Illness Pt is a 76 y.o. female with medical history significant of DM, HTN, HLD, CAD, cognitive impairment/ID, afib on Eliquis, biventricular pacer, and chronic systolic CHF presenting with a fall. She reports that she was petting her cat and tried to stand up and passed out. She thinks she was unconscious for about 10 minutes and then was confused for maybe an hour after. Her mother reports that she was pinned between the wall and the table and couldn't get up. Her mother thinks she can make her own decisions but she doesn't usually want to. She has no known h/o seizure. Her mother confirms that she is DNR.    PT Comments    Pt instructed in and performed gait training with RW and able to increase distance to 275 feet. Pt asymptomatic throughout. No rollator available to practice gait with and thus pt educated on how to use. Pt also states she has had some practice in the past with her mother's rollator.   Recommendations for follow up therapy are one component of a multi-disciplinary discharge planning process, led by the attending physician.  Recommendations may be updated based on patient status, additional functional criteria and insurance authorization.  Follow Up Recommendations  Home health PT (recommended ALF to pt but pt does not appear interested)     Assistance Recommended at Discharge Intermittent Supervision/Assistance  Patient can return home with the following A little help with walking and/or transfers;Assistance with cooking/housework;Help with stairs or ramp for entrance;Assist for transportation;Direct supervision/assist for medications management;Direct supervision/assist for financial management   Equipment Recommendations  Rollator (4 wheels)    Recommendations for Other Services       Precautions / Restrictions  Precautions Precautions: Fall Precaution Comments: Monitor for dizziness/check orthostatics Restrictions Weight Bearing Restrictions: No     Mobility  Bed Mobility               General bed mobility comments: Pt OOB in chair upon arrival.    Transfers Overall transfer level: Needs assistance Equipment used: Rolling walker (2 wheels) Transfers: Sit to/from Stand Sit to Stand: Min assist           General transfer comment: Cues for hand placements. Pt required increased time and utilized momentum rocking strategy due to weakness. Unable to achieve standing with standby A thus min assist utilized.    Ambulation/Gait Ambulation/Gait assistance: Supervision Gait Distance (Feet): 275 Feet Assistive device: Rolling walker (2 wheels) Gait Pattern/deviations: Step-through pattern, Trunk flexed Gait velocity: dec Gait velocity interpretation: 1.31 - 2.62 ft/sec, indicative of limited community ambulator   General Gait Details: Pt with no LOB. Pt denied dizziness throughout. HR monitored and stable.   Stairs             Wheelchair Mobility    Modified Rankin (Stroke Patients Only)       Balance Overall balance assessment: Needs assistance, History of Falls   Sitting balance-Leahy Scale: Fair     Standing balance support: Reliant on assistive device for balance, No upper extremity supported, Bilateral upper extremity supported Standing balance-Leahy Scale: Fair Standing balance comment: requires increased BOS when not utilizing RW                            Cognition Arousal/Alertness: Awake/alert Behavior During Therapy: Flat affect Overall Cognitive Status: History of cognitive impairments -  at baseline                                 General Comments: Pt following commands appropriately.        Exercises      General Comments General comments (skin integrity, edema, etc.): Seated BP 132/102 with 73 HR; pt denied  dizziness      Pertinent Vitals/Pain Pain Assessment Pain Assessment: 0-10 Pain Score: 0-No pain Pain Location: forehead Pain Descriptors / Indicators: Sore Pain Intervention(s): Monitored during session    Home Living                          Prior Function            PT Goals (current goals can now be found in the care plan section) Acute Rehab PT Goals Patient Stated Goal: to go home PT Goal Formulation: With patient Time For Goal Achievement: 03/31/22 Potential to Achieve Goals: Good Progress towards PT goals: Progressing toward goals    Frequency    Min 3X/week      PT Plan Current plan remains appropriate    Co-evaluation              AM-PAC PT "6 Clicks" Mobility   Outcome Measure  Help needed turning from your back to your side while in a flat bed without using bedrails?: None Help needed moving from lying on your back to sitting on the side of a flat bed without using bedrails?: None Help needed moving to and from a bed to a chair (including a wheelchair)?: A Little Help needed standing up from a chair using your arms (e.g., wheelchair or bedside chair)?: A Little Help needed to walk in hospital room?: A Little Help needed climbing 3-5 steps with a railing? : A Little 6 Click Score: 20    End of Session Equipment Utilized During Treatment: Gait belt Activity Tolerance: Patient tolerated treatment well Patient left: in chair;with call bell/phone within reach;with chair alarm set Nurse Communication: Mobility status PT Visit Diagnosis: Other abnormalities of gait and mobility (R26.89);History of falling (Z91.81)     Time: 8413-2440 PT Time Calculation (min) (ACUTE ONLY): 18 min  Charges:  $Gait Training: 8-22 mins                     Donna Bernard, PT    [facility] 03/26/2022, 10:14 AM

## 2022-03-26 NOTE — Discharge Summary (Signed)
Physician Discharge Summary  Melissa Bishop NGE:[id number] DOB: February 28, 1946 DOA: 03/23/2022  PCP: [name], MD  Admit date: 03/23/2022 Discharge date: 03/26/2022  Admitted From: home Discharge disposition: home with family   Recommendations for Outpatient Follow-Up:   Home health RN//PT Bailey Mech updated with her medications cards recommends changing--- close cardiology follow up Outpatient thyroid U/S Consider sleep study-- had bradycardia while sleeping   Discharge Diagnosis:   Principal Problem:   Syncope and collapse Active Problems:   Essential hypertension   Diabetes mellitus type II, non insulin dependent (HCC)   Persistent atrial fibrillation (HCC)   HFrEF (heart failure with reduced ejection fraction) (HCC)   Hyperlipidemia   Solitary thyroid nodule   DNR (do not resuscitate)    Discharge Condition: Improved.  Diet recommendation: Low sodium, heart healthy.  Carbohydrate-modified  Wound care: None.  Code status: Full.   History of Present Illness:   Melissa Bishop is a 76 y.o. female with medical history significant of DM, HTN, HLD, CAD, cognitive impairment/ID, afib on Eliquis, biventricular pacer, and chronic systolic CHF presenting with a fall.  She reports that she was petting her cat and tried to stand up and passed out.  She thinks she was unconscious for about 10 minutes and then was confused for maybe an hour after.  Her mother reports that she was pinned between the wall and the table and couldn't get up.  Her mother thinks she can make her own decisions but she doesn't usually want to.  She has no known h/o seizure.  Her mother confirms that she is DNR.   Hospital Course by Problem:   Syncope -Patient describes passing out while trying to stand after feeding/petting her cat -appears to be orthostatic/vasovagal syncope -She does not appear to have any significant traumatic injuries -orthos positive, IVF and repeat orthos -EEG normal -Most  recent echo was in January so will not plan to repeat at this time; pacer is being interrogated -discussed with cards: change metro to 50, spiro 12.5, d/c Cardizem, resume jardiance upon d/c, prn lasix -- all med changed discussed with brother and caregiver- Robyn -PT eval-- home health RN for help with medication management   Chronic systolic CHF -11/100 echo showed normalization of the EF -No significant evidence of volume overload at this time -see above cards recs -Has pacer/AICD- still awaiting interrogation   Afib -s/p AV nodal ablation -decrease BB -Continue Eliquis   HTN -Continue Toprol XL but at a lower dose   DM -resume home meds   HLD -Continue Lipitor, Zetia   Thyroid nodule -Appreciated incidentally on imaging -Needs outpatient [place]   DNR -confirmed upon admission   Obesity Estimated body mass index is 30.84 kg/m as calculated from the following:   Height as of this encounter: '5\' 11"'$  (1.803 m).   Weight as of this encounter: 100.3 kg.       Medical Consultants:   Cards (phone)   Discharge Exam:   Vitals:   03/26/22 0431 03/26/22 0804  BP: (!) 145/72   Pulse: 74   Resp: 17   Temp: 97.8 F (36.6 C)   SpO2: 100% 100%   Vitals:   03/25/22 1617 03/25/22 1945 03/26/22 0431 03/26/22 0804  BP: (!) 138/90 (!) 104/56 (!) 145/72   Pulse: 73 75 74   Resp: '18 17 17   '$ Temp: 97.8 F (36.6 C) 97.6 F (36.4 C) 97.8 F (36.6 C)   TempSrc:  Oral Oral   SpO2: 98% 100%  100% 100%  Weight:   106.6 kg   Height:        General exam: Appears calm and comfortable.    The results of significant diagnostics from this hospitalization (including imaging, microbiology, ancillary and laboratory) are listed below for reference.     Procedures and Diagnostic Studies:   EEG adult  Result Date: 03/23/2022 Lora Havens, MD     03/23/2022  4:20 PM Patient Name: Melissa Bishop MRN: [id number] Epilepsy Attending: Lora Havens Referring Physician/Provider:  Karmen Bongo, MD Date: 03/23/2022 Duration: 23.20 mins Patient history: 76 yo F with syncope. EEG to evaluate for seizure Level of alertness: Awake AEDs during EEG study: None Technical aspects: This EEG study was done with scalp electrodes positioned according to the 10-20 International system of electrode placement. Electrical activity was reviewed with band pass filter of 1-'70Hz'$ , sensitivity of 7 uV/mm, display speed of 87m/sec with a '60Hz'$  notched filter applied as appropriate. EEG data were recorded continuously and digitally stored.  Video monitoring was available and reviewed as appropriate. Description: The posterior dominant rhythm consists of 8 Hz activity of moderate voltage (25-35 uV) seen predominantly in posterior head regions, symmetric and reactive to eye opening and eye closing. Physiologic photic driving was not seen during photic stimulation.  Hyperventilation was not performed.   IMPRESSION: This study is within normal limits. No seizures or epileptiform discharges were seen throughout the recording. PLora Havens  CT Head Wo Contrast  Result Date: 03/23/2022 CLINICAL DATA:  Head trauma, minor (Age >= 65y); Neck trauma (Age >= 65y) EXAM: CT HEAD WITHOUT CONTRAST CT CERVICAL SPINE WITHOUT CONTRAST TECHNIQUE: Multidetector CT imaging of the head and cervical spine was performed following the standard protocol without intravenous contrast. Multiplanar CT image reconstructions of the cervical spine were also generated. RADIATION DOSE REDUCTION: This exam was performed according to the departmental dose-optimization program which includes automated exposure control, adjustment of the mA and/or kV according to patient size and/or use of iterative reconstruction technique. COMPARISON:  None Available. FINDINGS: CT HEAD FINDINGS Brain: No evidence of acute infarction, hemorrhage, hydrocephalus, extra-axial collection or mass lesion/mass effect. Vascular: No hyperdense vessel identified.  Calcific intracranial atherosclerosis. Skull: No acute fracture. Sinuses/Orbits: A few small retention cysts. Otherwise, clear sinuses. Other: No mastoid effusions. CT CERVICAL SPINE FINDINGS Alignment: Straightening. Mild anterolisthesis of C3 on C4, likely degenerative in etiology given facet arthropathy at this level. Rotation of C1 on C2. Skull base and vertebrae: Vertebral body heights are maintained. No evidence of acute fracture. Soft tissues and spinal canal: No prevertebral fluid or swelling. No visible canal hematoma. Disc levels: Multilevel degenerative disc disease and facet/uncovertebral hypertrophy with varying degrees of neural foraminal stenosis. Upper chest: Visualized lung apices are clear. Other: Approximately 1.8 cm left thyroid nodule. IMPRESSION: 1. No evidence of acute intracranial abnormality. 2. No evidence of acute fracture in the cervical spine. 3. Rotation of C1 on C2, likely positional in the absence of a fixed torticollis. 4. Approximately 1.8 cm left thyroid nodule. Recommend thyroid [place](ref: J Am Coll Radiol. 2015 Feb;12(2): 143-50). Electronically Signed   By: FMargaretha SheffieldM.D.   On: 03/23/2022 12:29   CT Cervical Spine Wo Contrast  Result Date: 03/23/2022 CLINICAL DATA:  Head trauma, minor (Age >= 65y); Neck trauma (Age >= 65y) EXAM: CT HEAD WITHOUT CONTRAST CT CERVICAL SPINE WITHOUT CONTRAST TECHNIQUE: Multidetector CT imaging of the head and cervical spine was performed following the standard protocol without intravenous contrast. Multiplanar CT image reconstructions  of the cervical spine were also generated. RADIATION DOSE REDUCTION: This exam was performed according to the departmental dose-optimization program which includes automated exposure control, adjustment of the mA and/or kV according to patient size and/or use of iterative reconstruction technique. COMPARISON:  None Available. FINDINGS: CT HEAD FINDINGS Brain: No evidence of acute infarction, hemorrhage,  hydrocephalus, extra-axial collection or mass lesion/mass effect. Vascular: No hyperdense vessel identified. Calcific intracranial atherosclerosis. Skull: No acute fracture. Sinuses/Orbits: A few small retention cysts. Otherwise, clear sinuses. Other: No mastoid effusions. CT CERVICAL SPINE FINDINGS Alignment: Straightening. Mild anterolisthesis of C3 on C4, likely degenerative in etiology given facet arthropathy at this level. Rotation of C1 on C2. Skull base and vertebrae: Vertebral body heights are maintained. No evidence of acute fracture. Soft tissues and spinal canal: No prevertebral fluid or swelling. No visible canal hematoma. Disc levels: Multilevel degenerative disc disease and facet/uncovertebral hypertrophy with varying degrees of neural foraminal stenosis. Upper chest: Visualized lung apices are clear. Other: Approximately 1.8 cm left thyroid nodule. IMPRESSION: 1. No evidence of acute intracranial abnormality. 2. No evidence of acute fracture in the cervical spine. 3. Rotation of C1 on C2, likely positional in the absence of a fixed torticollis. 4. Approximately 1.8 cm left thyroid nodule. Recommend thyroid US (ref: J Am Coll Radiol. 2015 Feb;12(2): 143-50). Electronically Signed   By: Margaretha Sheffield M.D.   On: 03/23/2022 12:29   DG Chest Portable 1 View  Result Date: 03/23/2022 CLINICAL DATA:  Trauma, fall EXAM: PORTABLE CHEST 1 VIEW COMPARISON:  10/26/2020 FINDINGS: Transverse diameter of heart is increased. Shift of mediastinum to the right has not changed significantly. Pacemaker/defibrillator battery is seen in the left infraclavicular region. Biventricular pacer leads are noted. There are no signs of pulmonary edema or focal pulmonary consolidation. There is no pleural effusion or pneumothorax. IMPRESSION: Cardiomegaly. There are no signs of pulmonary edema or focal pulmonary consolidation. Electronically Signed   By: Elmer Picker M.D.   On: 03/23/2022 12:06     Labs:   Basic  Metabolic Panel: Recent Labs  Lab 03/23/22 1126 03/24/22 0037  NA 142 140  K 4.4 4.3  CL 107 107  CO2 28 24  GLUCOSE 124* 118*  BUN 20 23  CREATININE 0.82 0.85  CALCIUM 9.4 9.5  MG 2.1  --    GFR Estimated Creatinine Clearance: 75.6 mL/min (by C-G formula based on SCr of 0.85 mg/dL). Liver Function Tests: No results for input(s): "AST", "ALT", "ALKPHOS", "BILITOT", "PROT", "ALBUMIN" in the last 168 hours. No results for input(s): "LIPASE", "AMYLASE" in the last 168 hours. No results for input(s): "AMMONIA" in the last 168 hours. Coagulation profile Recent Labs  Lab 03/23/22 1126  INR 1.1    CBC: Recent Labs  Lab 03/23/22 1126 03/24/22 0037  WBC 6.5 6.1  NEUTROABS 4.8  --   HGB 16.2* 16.1*  HCT 48.6* 48.0*  MCV 89.3 88.6  PLT 209 201   Cardiac Enzymes: No results for input(s): "CKTOTAL", "CKMB", "CKMBINDEX", "TROPONINI" in the last 168 hours. BNP: Invalid input(s): "POCBNP" CBG: Recent Labs  Lab 03/25/22 0712 03/25/22 1146 03/25/22 1615 03/25/22 2059 03/26/22 0821  GLUCAP 145* 140* 122* 147* 169*   D-Dimer No results for input(s): "DDIMER" in the last 72 hours. Hgb A1c Recent Labs    03/23/22 1455  HGBA1C 6.1*   Lipid Profile No results for input(s): "CHOL", "HDL", "LDLCALC", "TRIG", "CHOLHDL", "LDLDIRECT" in the last 72 hours. Thyroid function studies No results for input(s): "TSH", "T4TOTAL", "T3FREE", "THYROIDAB" in  the last 72 hours.  Invalid input(s): "FREET3" Anemia work up No results for input(s): "VITAMINB12", "FOLATE", "FERRITIN", "TIBC", "IRON", "RETICCTPCT" in the last 72 hours. Microbiology No results found for this or any previous visit (from the past 240 hour(s)).   Discharge Instructions:   Discharge Instructions     (HEART FAILURE PATIENTS) Call MD:  Anytime you have any of the following symptoms: 1) 3 pound weight gain in 24 hours or 5 pounds in 1 week 2) shortness of breath, with or without a dry hacking cough 3) swelling  in the hands, feet or stomach 4) if you have to sleep on extra pillows at night in order to breathe.   Complete by: As directed    Diet - low sodium heart healthy   Complete by: As directed    Diet Carb Modified   Complete by: As directed    Discharge instructions   Complete by: As directed    Get up slowly Wear compression stockings   Heart Failure patients record your daily weight using the same scale at the same time of day   Complete by: As directed    Increase activity slowly   Complete by: As directed       Allergies as of 03/26/2022   No Known Allergies      Medication List     STOP taking these medications    diltiazem 120 MG 24 hr capsule Commonly known as: CARDIZEM CD   Entresto 24-26 MG Generic drug: sacubitril-valsartan       TAKE these medications    apixaban 5 MG Tabs tablet Commonly known as: Eliquis Take 1 tablet (5 mg total) by mouth 2 (two) times daily.   atorvastatin 80 MG tablet Commonly known as: LIPITOR Take 80 mg by mouth at bedtime.   ezetimibe 10 MG tablet Commonly known as: ZETIA Take 10 mg by mouth daily.   furosemide 40 MG tablet Commonly known as: LASIX As needed for LE/ankle swelling What changed:  how much to take how to take this when to take this additional instructions   Jardiance 10 MG Tabs tablet Generic drug: empagliflozin TAKE 1 TABLET BY MOUTH EVERY DAY BEFORE BREAKFAST What changed: See the new instructions.   metFORMIN 750 MG 24 hr tablet Commonly known as: GLUCOPHAGE-XR Take 750 mg by mouth 2 (two) times daily with a meal.   metoprolol succinate 50 MG 24 hr tablet Commonly known as: TOPROL-XL Take 1 tablet (50 mg total) by mouth daily. What changed:  medication strength how much to take   PreserVision AREDS 2 Caps Take 1 capsule by mouth daily after breakfast.   Centrum Silver 50+Women Tabs Take 1 tablet by mouth daily with breakfast.   spironolactone 25 MG tablet Commonly known as:  ALDACTONE TAKE 1/2 TABLET BY MOUTH EVERY DAY   Stelara 45 MG/0.5ML Sosy injection Generic drug: ustekinumab Inject into the skin every 3 (three) months.   Vitamin D3 50 MCG (2000 UT) Tabs Take 2,000 Units by mouth daily.        Follow-up Information     [facility] Follow up.   Why: someone from the office will call to schedule home health visits Contact information: [address] [place] [address] [phone]                  Time coordinating discharge: 45 min  Signed:  Geradine Girt DO  [facility] 03/26/2022, 10:34 AM

## 2022-03-28 DIAGNOSIS — L400 Psoriasis vulgaris: Secondary | ICD-10-CM | POA: Diagnosis not present

## 2022-03-28 DIAGNOSIS — Z79899 Other long term (current) drug therapy: Secondary | ICD-10-CM | POA: Diagnosis not present

## 2022-03-30 ENCOUNTER — Telehealth: Payer: Self-pay | Admitting: Cardiovascular Disease

## 2022-03-30 NOTE — Progress Notes (Signed)
Remote ICD transmission.   

## 2022-03-30 NOTE — Telephone Encounter (Signed)
Left a message for Melissa Bishop to call back.

## 2022-03-30 NOTE — Telephone Encounter (Signed)
Pt c/o medication issue:  1. Name of Medication:  furosemide (LASIX) 40 MG tablet metoprolol succinate (TOPROL-XL) 50 MG 24 hr tablet diltiazem entresto  2. How are you currently taking this medication (dosage and times per day)? Needs to confirm   3. Are you having a reaction (difficulty breathing--STAT)? no  4. What is your medication issue? Melissa with [facility] states the patient was recently in the hospital and her medications were changed. She would like to verify what medications the patient needs to be on and at what dose. Phone: [phone]

## 2022-04-01 DIAGNOSIS — D6869 Other thrombophilia: Secondary | ICD-10-CM | POA: Diagnosis not present

## 2022-04-01 DIAGNOSIS — I5022 Chronic systolic (congestive) heart failure: Secondary | ICD-10-CM | POA: Diagnosis not present

## 2022-04-01 DIAGNOSIS — M810 Age-related osteoporosis without current pathological fracture: Secondary | ICD-10-CM | POA: Diagnosis not present

## 2022-04-01 DIAGNOSIS — I4821 Permanent atrial fibrillation: Secondary | ICD-10-CM | POA: Diagnosis not present

## 2022-04-01 DIAGNOSIS — H9193 Unspecified hearing loss, bilateral: Secondary | ICD-10-CM | POA: Diagnosis not present

## 2022-04-01 DIAGNOSIS — E1142 Type 2 diabetes mellitus with diabetic polyneuropathy: Secondary | ICD-10-CM | POA: Diagnosis not present

## 2022-04-01 DIAGNOSIS — Z853 Personal history of malignant neoplasm of breast: Secondary | ICD-10-CM | POA: Diagnosis not present

## 2022-04-01 DIAGNOSIS — Z7901 Long term (current) use of anticoagulants: Secondary | ICD-10-CM | POA: Diagnosis not present

## 2022-04-01 DIAGNOSIS — E785 Hyperlipidemia, unspecified: Secondary | ICD-10-CM | POA: Diagnosis not present

## 2022-04-01 DIAGNOSIS — E559 Vitamin D deficiency, unspecified: Secondary | ICD-10-CM | POA: Diagnosis not present

## 2022-04-01 DIAGNOSIS — Z7984 Long term (current) use of oral hypoglycemic drugs: Secondary | ICD-10-CM | POA: Diagnosis not present

## 2022-04-01 DIAGNOSIS — F79 Unspecified intellectual disabilities: Secondary | ICD-10-CM | POA: Diagnosis not present

## 2022-04-01 DIAGNOSIS — L409 Psoriasis, unspecified: Secondary | ICD-10-CM | POA: Diagnosis not present

## 2022-04-01 DIAGNOSIS — E1159 Type 2 diabetes mellitus with other circulatory complications: Secondary | ICD-10-CM | POA: Diagnosis not present

## 2022-04-01 DIAGNOSIS — I110 Hypertensive heart disease with heart failure: Secondary | ICD-10-CM | POA: Diagnosis not present

## 2022-04-02 ENCOUNTER — Telehealth: Payer: Self-pay | Admitting: Cardiovascular Disease

## 2022-04-02 DIAGNOSIS — I5022 Chronic systolic (congestive) heart failure: Secondary | ICD-10-CM | POA: Diagnosis not present

## 2022-04-02 DIAGNOSIS — I4821 Permanent atrial fibrillation: Secondary | ICD-10-CM | POA: Diagnosis not present

## 2022-04-02 DIAGNOSIS — E1142 Type 2 diabetes mellitus with diabetic polyneuropathy: Secondary | ICD-10-CM | POA: Diagnosis not present

## 2022-04-02 DIAGNOSIS — M810 Age-related osteoporosis without current pathological fracture: Secondary | ICD-10-CM | POA: Diagnosis not present

## 2022-04-02 DIAGNOSIS — I110 Hypertensive heart disease with heart failure: Secondary | ICD-10-CM | POA: Diagnosis not present

## 2022-04-02 DIAGNOSIS — E1159 Type 2 diabetes mellitus with other circulatory complications: Secondary | ICD-10-CM | POA: Diagnosis not present

## 2022-04-02 NOTE — Telephone Encounter (Signed)
Pt c/o swelling: STAT is pt has developed SOB within 24 hours  How much weight have you gained and in what time span?  8 lbs in 7 days   If swelling, where is the swelling located?  Ankles and legs   Are you currently taking a fluid pill?  furosemide (LASIX) 40 MG tablet, patient's friend states the patient had stopped taking the medication per advisement from the ED. She states patient was advised to take the medication as needed for retention. However, patient has difficulty understanding medication instructions. She states she went and saw the patient today and due to her gaining 8 lbs in 1 week she put the patient back on the medication today. She states she put it in her pill bottle.    Are you currently SOB?  Not currently with patient   Do you have a log of your daily weights (if so, list)?  8/14: 215 lbs  8/21: 223 lbs   Have you gained 3 pounds in a day or 5 pounds in a week?    Have you traveled recently?  No

## 2022-04-02 NOTE — Telephone Encounter (Signed)
Called Bailey Mech (okay per DPR) she states she seen patient last week after hospital visit- her weight once home was 215- she went back to see her today and she was swelling in her legs/ankles. She states hospital advised to use Lasix 40 mg as needed- however she states patient gets confused and has not taken it since hospital stay. Patient caregiver advised to continue Lasix at this time, continue to weigh daily, advised to call us if she had questions - per hospital visit she needed cardiology follow up- I scheduled with Almyra Deforest, PA-C on 08/28 at 2:45 PM (2 weeks post hospital stay) for follow up on Lasix and hospital stay. Will route to MD as FYI.

## 2022-04-05 ENCOUNTER — Telehealth: Payer: Self-pay | Admitting: Cardiovascular Disease

## 2022-04-05 NOTE — Telephone Encounter (Signed)
LMTCB

## 2022-04-05 NOTE — Telephone Encounter (Signed)
Received a phone call from [facility] to ask questions regarding patients medications. Please call Melissa at [phone] or [phone]

## 2022-04-06 ENCOUNTER — Other Ambulatory Visit: Payer: Self-pay | Admitting: Family Medicine

## 2022-04-06 DIAGNOSIS — E041 Nontoxic single thyroid nodule: Secondary | ICD-10-CM

## 2022-04-09 ENCOUNTER — Ambulatory Visit: Payer: Medicare Other | Admitting: Physician Assistant

## 2022-04-10 ENCOUNTER — Other Ambulatory Visit: Payer: Self-pay | Admitting: Family Medicine

## 2022-04-10 DIAGNOSIS — I4821 Permanent atrial fibrillation: Secondary | ICD-10-CM | POA: Diagnosis not present

## 2022-04-10 DIAGNOSIS — I110 Hypertensive heart disease with heart failure: Secondary | ICD-10-CM | POA: Diagnosis not present

## 2022-04-10 DIAGNOSIS — Z1231 Encounter for screening mammogram for malignant neoplasm of breast: Secondary | ICD-10-CM

## 2022-04-10 DIAGNOSIS — M810 Age-related osteoporosis without current pathological fracture: Secondary | ICD-10-CM | POA: Diagnosis not present

## 2022-04-10 DIAGNOSIS — I5022 Chronic systolic (congestive) heart failure: Secondary | ICD-10-CM | POA: Diagnosis not present

## 2022-04-10 DIAGNOSIS — E1142 Type 2 diabetes mellitus with diabetic polyneuropathy: Secondary | ICD-10-CM | POA: Diagnosis not present

## 2022-04-10 DIAGNOSIS — E1159 Type 2 diabetes mellitus with other circulatory complications: Secondary | ICD-10-CM | POA: Diagnosis not present

## 2022-04-10 NOTE — Telephone Encounter (Signed)
Melissa calling to verify patient's medications and have them refilled to [facility]. Phone: [phone] ext [phone]

## 2022-04-13 NOTE — Telephone Encounter (Signed)
Melissa from [facility] returned call she can be reached at Phone: [phone] ext [phone].

## 2022-04-17 DIAGNOSIS — E1159 Type 2 diabetes mellitus with other circulatory complications: Secondary | ICD-10-CM | POA: Diagnosis not present

## 2022-04-17 DIAGNOSIS — E1142 Type 2 diabetes mellitus with diabetic polyneuropathy: Secondary | ICD-10-CM | POA: Diagnosis not present

## 2022-04-17 DIAGNOSIS — I4821 Permanent atrial fibrillation: Secondary | ICD-10-CM | POA: Diagnosis not present

## 2022-04-17 DIAGNOSIS — I5022 Chronic systolic (congestive) heart failure: Secondary | ICD-10-CM | POA: Diagnosis not present

## 2022-04-17 DIAGNOSIS — M810 Age-related osteoporosis without current pathological fracture: Secondary | ICD-10-CM | POA: Diagnosis not present

## 2022-04-17 DIAGNOSIS — I110 Hypertensive heart disease with heart failure: Secondary | ICD-10-CM | POA: Diagnosis not present

## 2022-04-19 ENCOUNTER — Ambulatory Visit
Admission: RE | Admit: 2022-04-19 | Discharge: 2022-04-19 | Disposition: A | Discharge status: Home / Self Care | Payer: Medicare Other | Source: Ambulatory Visit | Attending: Family Medicine | Admitting: Family Medicine

## 2022-04-19 ENCOUNTER — Ambulatory Visit: Payer: Medicare Other

## 2022-04-19 DIAGNOSIS — E041 Nontoxic single thyroid nodule: Secondary | ICD-10-CM | POA: Diagnosis not present

## 2022-04-23 ENCOUNTER — Other Ambulatory Visit: Payer: Self-pay

## 2022-04-23 NOTE — Telephone Encounter (Signed)
Spoke with Melissa Bishop at [facility]. Patient was d/c from hospital on 03/26/22. Her diltiazem and entresto was discontinued. Her Toprol was decreased to '50mg'$  daily. Melissa Bishop wanted verification on those medications. Please advise.

## 2022-04-23 NOTE — Telephone Encounter (Signed)
Melissa from [facility] calling back.

## 2022-04-24 IMAGING — CT CT NECK W/O CM
4 of 6 series · 12 of 33 positions shown, 14 images · IV contrast (APPLIED)
Comparison: None.

CLINICAL DATA: Initial evaluation for foreign body sensation in
throat.

EXAM:
CT NECK WITHOUT CONTRAST
TECHNIQUE: Multidetector CT imaging of the neck was performed following the
standard protocol without intravenous contrast.

[Series 3: axial neck · axial · 0.64mm/px · z∈[-156,-56]mm · 2 of 150 slices shown]
[im 50/150  bone]
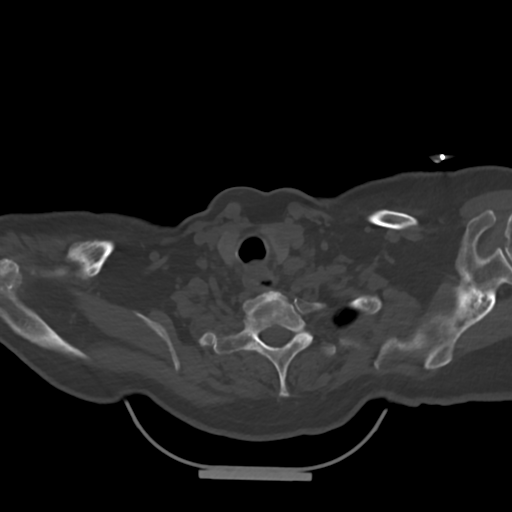
[im 100/150  bone]
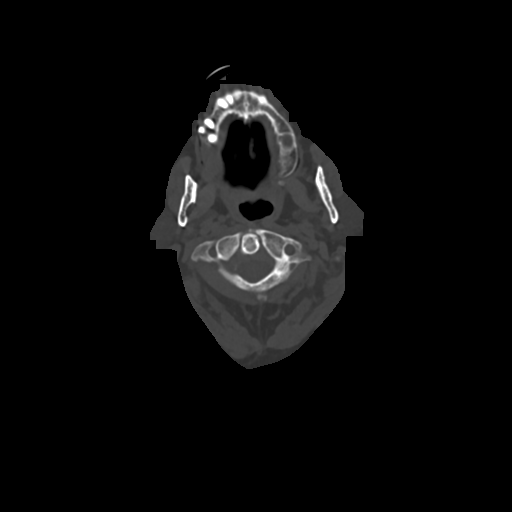

[Series 6: sag neck · sagittal · 0.55mm/px · 5 of 97 slices shown, 6 images]
[im 33/97  bone]
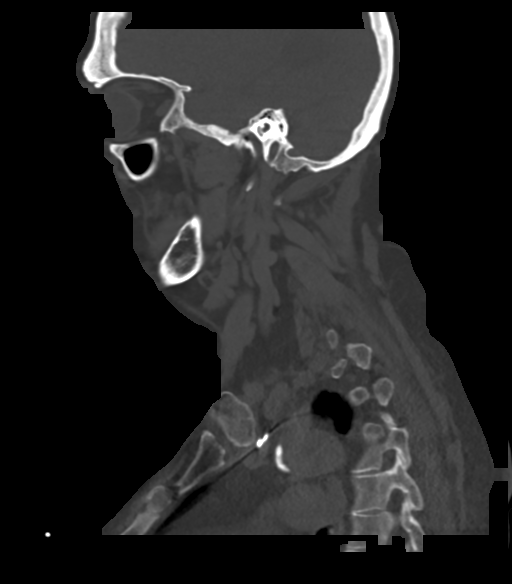
[im 41/97  bone]
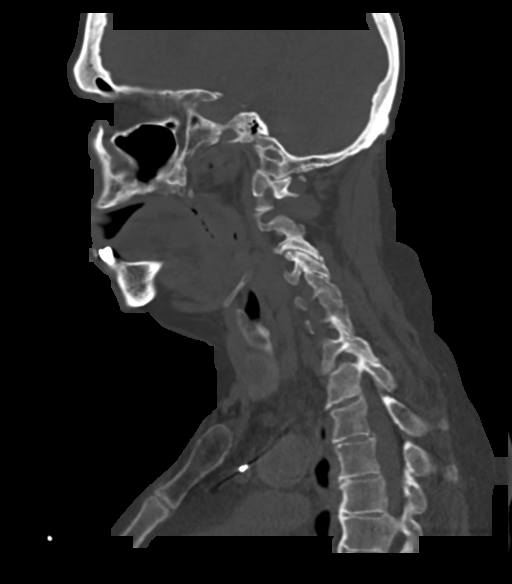
[im 49/97  soft-tissue]
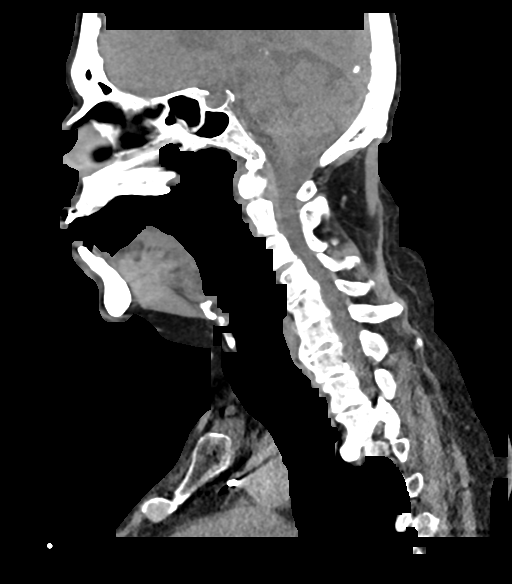
[im 49/97  bone]
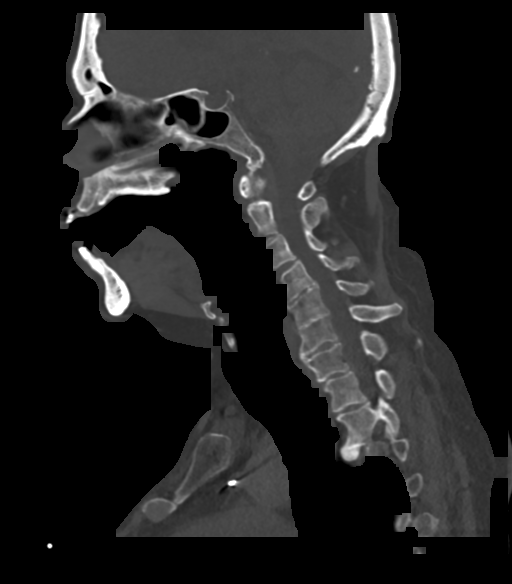
[im 57/97  bone]
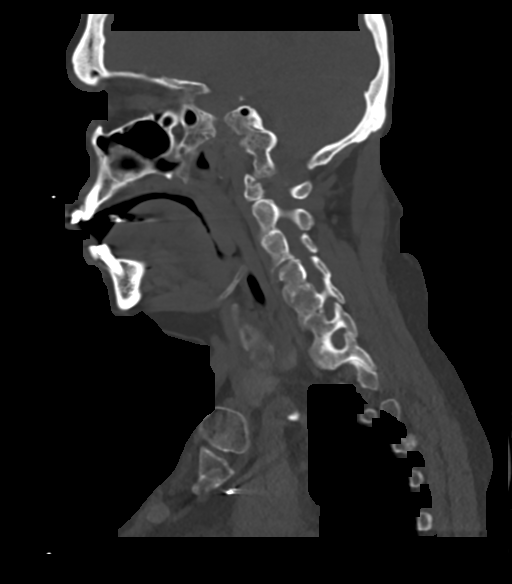
[im 65/97  bone]
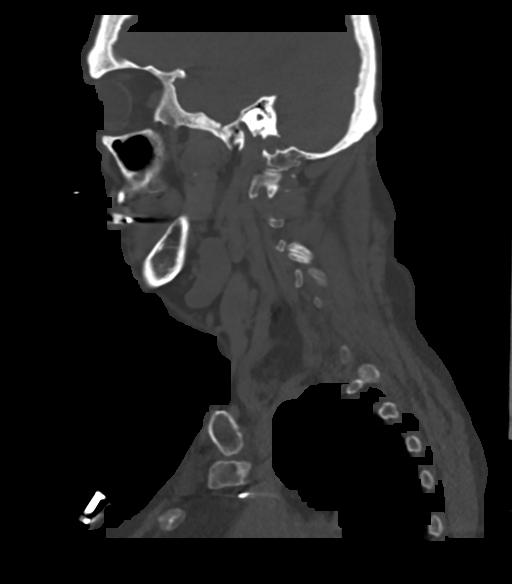

[Series 7: cor neck · coronal · 0.42mm/px · 3 of 138 slices shown]
[im 28/138  bone]
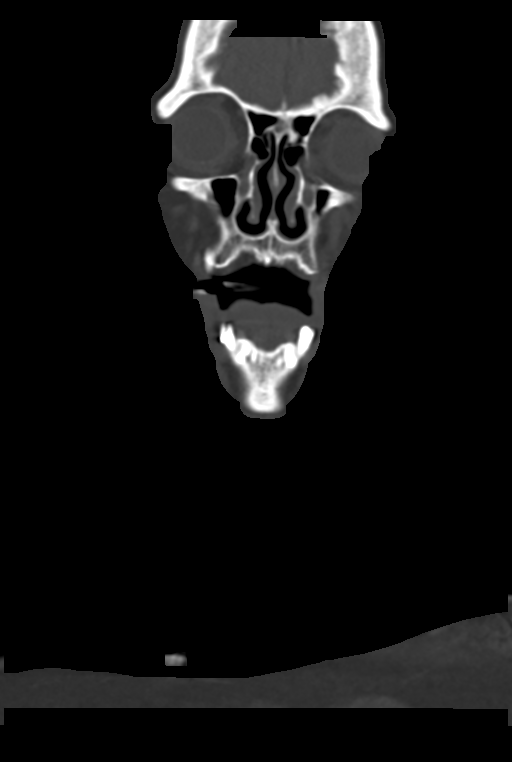
[im 55/138  bone]
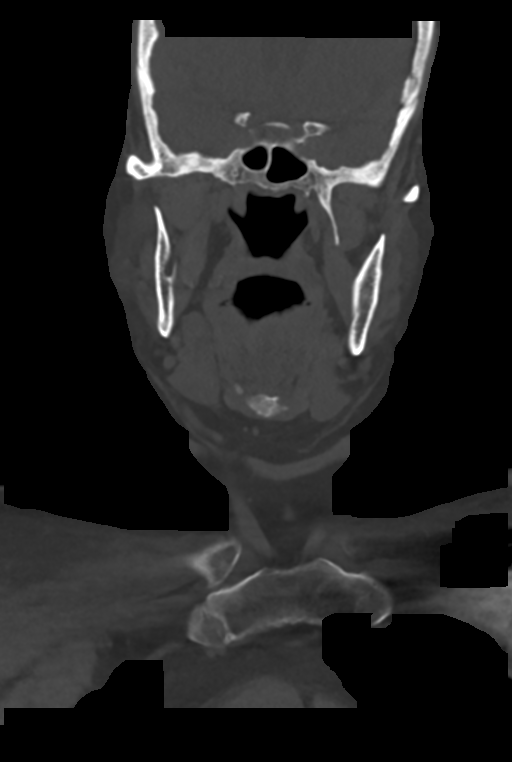
[im 83/138  bone]
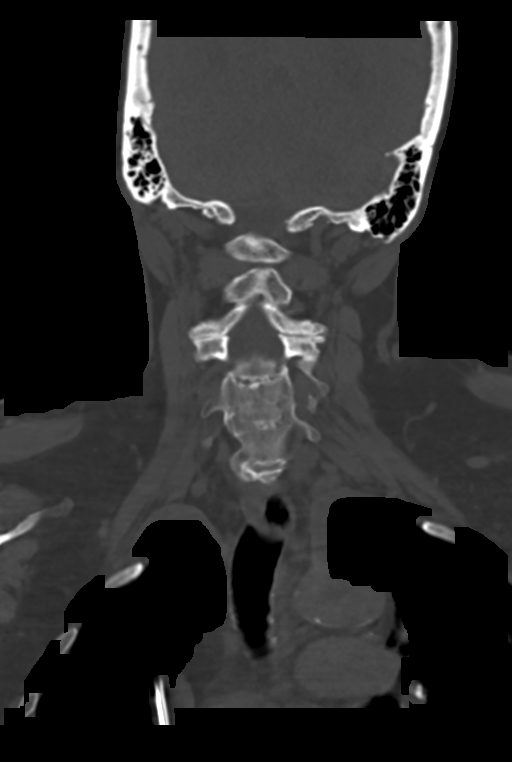

[Series 8: ax oropharynx · axial · 0.41mm/px · z∈[-152,-50]mm · 2 of 154 slices shown, 3 images]
[im 52/154  soft-tissue]
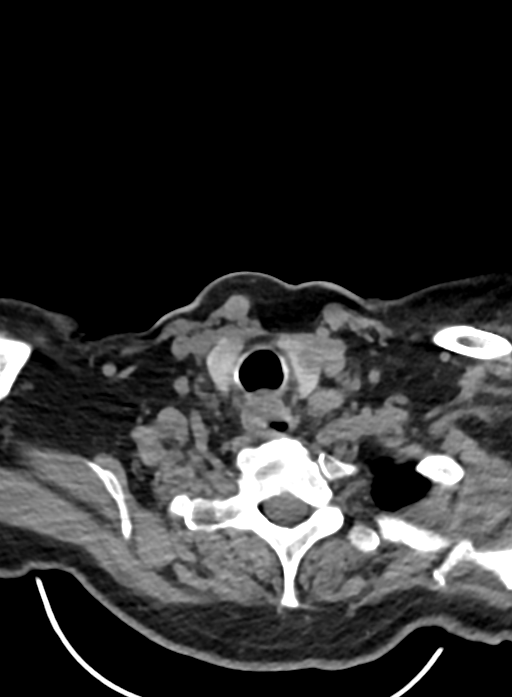
[im 52/154  bone]
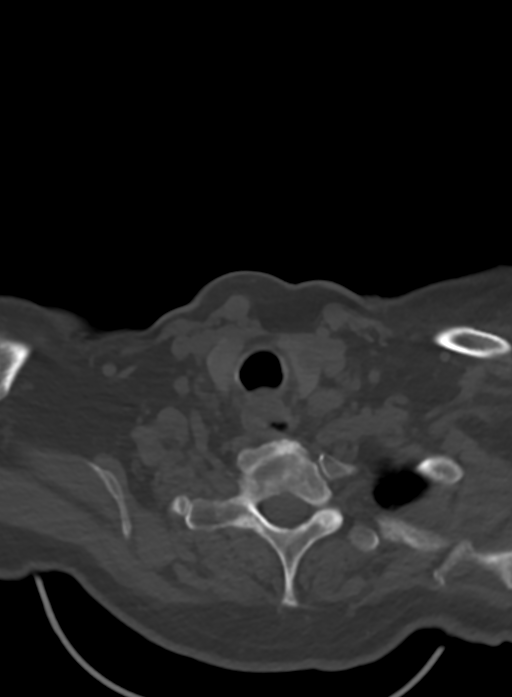
[im 103/154  bone]
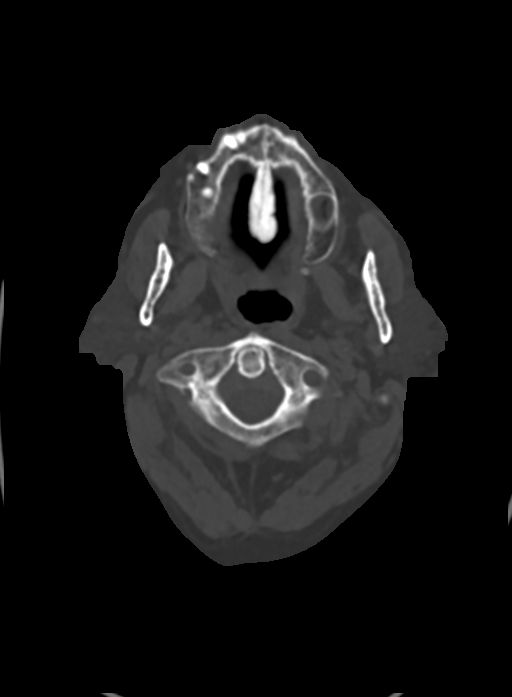

[12 of 33 positions shown; findings below may reference images not displayed]

FINDINGS: Pharynx and larynx: Oral cavity within normal limits. No acute
abnormality about the remaining dentition. Palatine tonsils fairly
symmetric and within normal limits. 5 mm dystrophic calcification
noted at the medial aspect of the right tonsil. Parapharyngeal fat
maintained. Remainder of the oropharynx and nasopharynx within
normal limits. No retropharyngeal collection or swelling. Epiglottis
normal. Vallecula clear. Remainder of the hypopharynx and
supraglottic larynx within normal limits. Piriform sinuses clear.
Glottis normal. Subglottic airway patent clear. Visualized upper
esophagus within normal limits. No radiopaque foreign body.

Salivary glands: Salivary glands including the parotid and
submandibular glands are within normal limits.

Thyroid: 11 mm left thyroid nodule noted, of doubtful significance
given size and patient age. No follow-up imaging recommended (ref: [HOSPITAL]. [DATE]): 143-50).

Lymph nodes: No enlarged or pathologic adenopathy within the neck.

Vascular: Evaluation or vascular patency limited given lack of IV
contrast. Mild atheromatous change about the aortic arch, left
carotid bifurcation, and carotid siphons.

Limited intracranial: Unremarkable.

Visualized orbits: Patient status post bilateral ocular lens
replacement. Globes and orbital soft tissues demonstrate no acute
finding.

Mastoids and visualized paranasal sinuses: Small right maxillary and
right sphenoid sinus retention cyst noted. Paranasal sinuses are
otherwise clear. Mastoid air cells and middle ear cavities are well
pneumatized and free of fluid.

Skeleton: No visible acute osseous finding. No discrete or worrisome
osseous lesions. Mild-to-moderate multilevel cervical spondylosis
without high-grade stenosis.

Upper chest: Left-sided pacemaker/AICD in place. Mild hazy
ground-glass opacity within the visualized lungs, likely reflecting
a degree of pulmonary interstitial congestion. Visualized upper
chest demonstrates no other acute finding.

Other: None.
IMPRESSION: 1. Negative CT of the neck. No radiopaque foreign body identified.
No other acute abnormality.
2. Mild hazy ground-glass opacity within the visualized lungs,
likely reflecting a degree of mild pulmonary interstitial
congestion.

## 2022-04-24 NOTE — Telephone Encounter (Signed)
Spoke with mother to have patient call clinic to set up an appointment to discuss medications. She prefers video visit due to transportation issues.

## 2022-04-24 NOTE — Telephone Encounter (Signed)
Spoke to patient she stated do not talk to her friend Shirlean Mylar who called.Stated she is having swelling in both lower legs for the past couple of months.She has gained weight.She has sob with exertion.Appointment offered this week but she refused.Stated she cannot come until the end of month.Appointment scheduled with Coletta Memos NP 9/26 at 2:20 pm.  Spoke to patient's friend Shirlean Mylar advised she is not on patient's hippa.I cannot discuss patient's health information with her.

## 2022-04-24 NOTE — Telephone Encounter (Signed)
Melissa Bishop is following up.

## 2022-04-24 NOTE — Telephone Encounter (Signed)
Returned call to patient's friend Shirlean Mylar left message to call back. Called patient phone rings busy.

## 2022-04-24 NOTE — Telephone Encounter (Signed)
Left message on phone that an office visit needs to be scheduled.

## 2022-04-24 NOTE — Telephone Encounter (Signed)
Patient's friend Shirlean Mylar following up, very concerned due to recent fluid retention. She insists on speaking with someone this afternoon regarding medications.   Pt c/o swelling: STAT is pt has developed SOB within 24 hours  How much weight have you gained and in what time span?  10 lbs in 1 month  If swelling, where is the swelling located?  Ankles and feet  Are you currently taking a fluid pill?  Yes   Are you currently SOB?  Not currently with patient   Do you have a log of your daily weights (if so, list)?  Weighed 215 lbs after leaving the hospital Weighs 224 as of last week   Have you gained 3 pounds in a day or 5 pounds in a week?   Have you traveled recently?  No

## 2022-04-25 DIAGNOSIS — I5022 Chronic systolic (congestive) heart failure: Secondary | ICD-10-CM | POA: Diagnosis not present

## 2022-04-25 DIAGNOSIS — M810 Age-related osteoporosis without current pathological fracture: Secondary | ICD-10-CM | POA: Diagnosis not present

## 2022-04-25 DIAGNOSIS — I4821 Permanent atrial fibrillation: Secondary | ICD-10-CM | POA: Diagnosis not present

## 2022-04-25 DIAGNOSIS — E1159 Type 2 diabetes mellitus with other circulatory complications: Secondary | ICD-10-CM | POA: Diagnosis not present

## 2022-04-25 DIAGNOSIS — I110 Hypertensive heart disease with heart failure: Secondary | ICD-10-CM | POA: Diagnosis not present

## 2022-04-25 DIAGNOSIS — E1142 Type 2 diabetes mellitus with diabetic polyneuropathy: Secondary | ICD-10-CM | POA: Diagnosis not present

## 2022-05-02 ENCOUNTER — Other Ambulatory Visit: Payer: Self-pay | Admitting: Cardiovascular Disease

## 2022-05-02 DIAGNOSIS — Z961 Presence of intraocular lens: Secondary | ICD-10-CM | POA: Diagnosis not present

## 2022-05-02 DIAGNOSIS — Z83518 Family history of other specified eye disorder: Secondary | ICD-10-CM | POA: Diagnosis not present

## 2022-05-02 DIAGNOSIS — E119 Type 2 diabetes mellitus without complications: Secondary | ICD-10-CM | POA: Diagnosis not present

## 2022-05-02 DIAGNOSIS — H35372 Puckering of macula, left eye: Secondary | ICD-10-CM | POA: Diagnosis not present

## 2022-05-03 ENCOUNTER — Telehealth: Payer: Self-pay | Admitting: Cardiovascular Disease

## 2022-05-03 NOTE — Telephone Encounter (Signed)
Melissa Bishop with [facility] is requesting to discuss medication changes from when patient was admitted in the hospital in August.   Phone#: [phone] (ext#: [phone])

## 2022-05-03 NOTE — Telephone Encounter (Signed)
Melissa was returning call. Please advise

## 2022-05-03 NOTE — Telephone Encounter (Signed)
Left message to call back  

## 2022-05-03 NOTE — Telephone Encounter (Signed)
Spoke to [place] at [employer] wanted to confirm diltiazem and Entresto were discontinued.  Per discharge:  dilt + Entresto d/c due to syncope/orthostatic.   Advised overdue for hospital follow up (last follow up was cancelled).  Appt currently scheduled 9/26 for follow up.    She states patient has switched to [facility] and will need all rx sent to pharmacy at appt.

## 2022-05-06 NOTE — Progress Notes (Unsigned)
Cardiology Clinic Note   Patient Name: Rosamaria Donn Date of Encounter: 05/08/2022  Primary Care Provider:  [name], MD Primary Cardiologist:  Evalina Field, MD  Patient Profile    Thanya Cegielski 76 year old female presents to the clinic today for follow-up evaluation of her essential hypertension, lower extremity swelling, and atrial fibrillation.  Past Medical History    Past Medical History:  Diagnosis Date   CHF (congestive heart failure) ([place])    Coronary artery disease    Diabetes mellitus without complication ([place])    High cholesterol    Hypertension    Psoriasis    Past Surgical History:  Procedure Laterality Date   AV NODE ABLATION N/A 07/18/2020   Procedure: AV NODE ABLATION;  Surgeon: Vickie Epley, MD;  Location: [place] [facility];  Service: Cardiovascular;  Laterality: N/A;   BIV ICD INSERTION CRT-D N/A 06/06/2020   Procedure: BIV ICD INSERTION CRT-D;  Surgeon: Vickie Epley, MD;  Location: [facility];  Service: Cardiovascular;  Laterality: N/A;   BREAST EXCISIONAL BIOPSY Right ? more than 30 years   BREAST SURGERY     CARDIOVERSION N/A 04/04/2020   Procedure: CARDIOVERSION;  Surgeon: Jerline Pain, MD;  Location: [facility] ENDOSCOPY;  Service: Cardiovascular;  Laterality: N/A;   CARDIOVERSION N/A 05/10/2020   Procedure: CARDIOVERSION;  Surgeon: [name], MD;  Location: [facility];  Service: Cardiovascular;  Laterality: N/A;   TEE WITHOUT CARDIOVERSION N/A 04/04/2020   Procedure: TRANSESOPHAGEAL ECHOCARDIOGRAM (TEE);  Surgeon: Jerline Pain, MD;  Location[facility];  Service: Cardiovascular;  Laterality: N/A;    Allergies  No Known Allergies  History of Present Illness    Monet North has a PMH of atrial fibrillation, essential hypertension, HFrEF, diabetes mellitus type 2, hearing loss, neuropathy, psoriasis, DNR, dyspnea, cognitive impairment, hyperlipidemia, morbid obesity, abnormal gait, syncope and collapse.  She  was seen in follow-up by Dr. Audie Box on 08/31/2021.  She presented with her mother as well as a church member.  She had been doing well.  She reported taking lisinopril and Entresto.  She was instructed to stop taking lisinopril.  Her echocardiogram was reviewed which showed normal LV function.  She was having no symptoms of heart failure.  She denied chest pain and shortness of breath.  She had no lower extremity swelling.  She contacted the nurse triage line on 04/05/2022 and indicated that she had increased lower extremity swelling, increased shortness of breath.  She was offered an appointment but reported that she could not make it to the end of the month.  She presents clinic today for follow-up evaluation states she feels well.  She presents with her mother.  Her mother is frustrated that she is taking 12 pills.  We reviewed her medication.  She expressed understanding.  She remains physically active bowling on Tuesdays and walking other days of the week.  Her weight today is stable.  I will continue her current medication regimen, have her increase her physical activity as tolerated, and give the salty 6 diet sheet.  We will plan follow-up in 6 months.  She denies chest pain, shortness of breath, lower extremity edema, fatigue, palpitations, melena, hematuria, hemoptysis, diaphoresis, weakness, presyncope, syncope, orthopnea, and PND.    Home Medications    Prior to Admission medications   Medication Sig Start Date End Date Taking? Authorizing Provider  apixaban (ELIQUIS) 5 MG TABS tablet Take 1 tablet (5 mg total) by mouth 2 (two) times daily. 04/22/20  Geralynn Rile, MD  atorvastatin (LIPITOR) 80 MG tablet Take 80 mg by mouth at bedtime.  12/24/19   [provider]  Cholecalciferol (VITAMIN D3) 50 MCG (2000 UT) TABS Take 2,000 Units by mouth daily.    [provider]  ezetimibe (ZETIA) 10 MG tablet Take 10 mg by mouth daily.    [provider]  furosemide  (LASIX) 40 MG tablet As needed for LE/ankle swelling 03/26/22   Eulogio Bear U, DO  JARDIANCE 10 MG TABS tablet TAKE 1 TABLET BY MOUTH EVERY DAY BEFORE BREAKFAST 05/03/22   O'Neal, Cassie Freer, MD  metFORMIN (GLUCOPHAGE-XR) 750 MG 24 hr tablet Take 750 mg by mouth 2 (two) times daily with a meal. 11/03/21   [provider]  metoprolol succinate (TOPROL-XL) 50 MG 24 hr tablet Take 1 tablet (50 mg total) by mouth daily. 03/26/22   Geradine Girt, DO  Multiple Vitamins-Minerals (CENTRUM SILVER 50+WOMEN) TABS Take 1 tablet by mouth daily with breakfast.    [provider]  Multiple Vitamins-Minerals (PRESERVISION AREDS 2) CAPS Take 1 capsule by mouth daily after breakfast.    [provider]  spironolactone (ALDACTONE) 25 MG tablet TAKE 1/2 TABLET BY MOUTH EVERY DAY 05/03/22   O'Neal, Cassie Freer, MD  STELARA 45 MG/0.5ML SOSY injection Inject into the skin every 3 (three) months. 06/15/20   [provider]    Family History    Family History  Problem Relation Age of Onset   Heart disease Mother        Required pacemaker in her 58s   She indicated that her mother is alive. She indicated that her father is deceased.  Social History    Social History   Socioeconomic History   Marital status: Single    Spouse name: Not on file   Number of children: Not on file   Years of education: Not on file   Highest education level: Not on file  Occupational History   Not on file  Tobacco Use   Smoking status: Former   Smokeless tobacco: Never  Vaping Use   Vaping Use: Never used  Substance and Sexual Activity   Alcohol use: No   Drug use: No   Sexual activity: Not on file  Other Topics Concern   Not on file  Social History Narrative   Not on file   Social Determinants of Health   Financial Resource Strain: Not on file  Food Insecurity: Not on file  Transportation Needs: Not on file  Physical Activity: Not on file  Stress: Not on file  Social  Connections: Not on file  Intimate Partner Violence: Not on file     Review of Systems    General:  No chills, fever, night sweats or weight changes.  Cardiovascular:  No chest pain, dyspnea on exertion, edema, orthopnea, palpitations, paroxysmal nocturnal dyspnea. Dermatological: No rash, lesions/masses Respiratory: No cough, dyspnea Urologic: No hematuria, dysuria Abdominal:   No nausea, vomiting, diarrhea, bright red blood per rectum, melena, or hematemesis Neurologic:  No visual changes, wkns, changes in mental status. All other systems reviewed and are otherwise negative except as noted above.  Physical Exam    VS:  none today.BP 130/72   Pulse 73   Ht '5\' 11"'$  (1.803 m)   Wt 226 lb 3.2 oz (102.6 kg)   SpO2 97%   BMI 31.55 kg/m  , BMI Body mass index is 31.55 kg/m. GEN: Well nourished, well developed, in no acute distress. HEENT: normal.  Neck: Supple, no JVD, carotid bruits, or masses. Cardiac: RRR, no murmurs, rubs, or gallops. No clubbing, cyanosis, edema.  Radials/DP/PT 2+ and equal bilaterally.  Respiratory:  Respirations regular and unlabored, clear to auscultation bilaterally. GI: Soft, nontender, nondistended, BS + x 4. MS: no deformity or atrophy. Skin: warm and dry, no rash. Neuro:  Strength and sensation are intact. Psych: Normal affect.  Accessory Clinical Findings    Recent Labs: 03/23/2022: Magnesium 2.1 03/24/2022: BUN 23; Creatinine, Ser 0.85; Hemoglobin 16.1; Platelets 201; Potassium 4.3; Sodium 140   Recent Lipid Panel    Component Value Date/Time   CHOL 200 04/02/2020 0231   TRIG 104 04/02/2020 0231   HDL 37 (L) 04/02/2020 0231   CHOLHDL 5.4 04/02/2020 0231   VLDL 21 04/02/2020 0231   LDLCALC 142 (H) 04/02/2020 0231         ECG personally reviewed by me today-none today.  Echocardiogram 08/24/2021  IMPRESSIONS     1. Left ventricular ejection fraction, by estimation, is 60 to 65%. The  left ventricle has normal function. The left  ventricle has no regional  wall motion abnormalities. There is mild left ventricular hypertrophy.  Left ventricular diastolic parameters  were normal.   2. Right ventricular systolic function is normal. The right ventricular  size is normal.   3. Right atrial size was mildly dilated.   4. The mitral valve is grossly normal. Trivial mitral valve  regurgitation.   5. The aortic valve is normal in structure. Aortic valve regurgitation is  not visualized.   Assessment & Plan   1.  Systolic CHF-no increased DOE or activity intolerance.  Euvolemic.  Weight stable.  CRT-D-D10/21, status post AVN ablation 12/21.  EF 60 to 65% 1/23.  Contacted nurse triage line 04/05/2022 with complaints of increased shortness of breath and lower extremity edema. Continue furosemide, Jardiance, spironolactone, metoprolol Heart healthy low-sodium diet-salty 6 given Increase physical activity as tolerated   Atrial fibrillation-heart rate today .  Status post AVN ablation in 12/21. Continue apixaban, metoprolol Heart healthy low-sodium diet-salty 6 given Increase physical activity as tolerated   Diabetes-glucose 225 on 03/26/2022 Continue metformin Heart healthy low-sodium diet-salty 6 given Increase physical activity as tolerated Follows with PCP  Hyperlipidemia-LDL 94 Continue atorvastatin Heart healthy low-sodium high-fiber diet Increase physical activity as tolerated   Disposition: Follow-up with Dr. Audie Box or me in 6 months.   Jossie Ng. Arsh Feutz NP-C     05/08/2022, 1:47 PM [facility] [place] [address] Office [phone] Fax [phone]  Notice: This dictation was prepared with Dragon dictation along with smaller phrase technology. Any transcriptional errors that result from this process are unintentional and may not be corrected upon review.  I spent 15 minutes examining this patient, reviewing medications, and using patient centered shared decision  making involving her cardiac care.  Prior to her visit I spent greater than 20 minutes reviewing her past medical history,  medications, and prior cardiac tests.

## 2022-05-07 ENCOUNTER — Ambulatory Visit
Admission: RE | Admit: 2022-05-07 | Discharge: 2022-05-07 | Disposition: A | Discharge status: Home / Self Care | Payer: Medicare Other | Source: Ambulatory Visit | Attending: Family Medicine | Admitting: Family Medicine

## 2022-05-07 DIAGNOSIS — Z1231 Encounter for screening mammogram for malignant neoplasm of breast: Secondary | ICD-10-CM

## 2022-05-08 ENCOUNTER — Ambulatory Visit: Payer: Medicare Other | Attending: General Practice | Admitting: General Practice

## 2022-05-08 ENCOUNTER — Encounter: Payer: Self-pay | Admitting: General Practice

## 2022-05-08 VITALS — BP 130/72 | HR 73 | Ht 71.0 in | Wt 226.2 lb

## 2022-05-08 DIAGNOSIS — I5022 Chronic systolic (congestive) heart failure: Secondary | ICD-10-CM

## 2022-05-08 DIAGNOSIS — E119 Type 2 diabetes mellitus without complications: Secondary | ICD-10-CM

## 2022-05-08 DIAGNOSIS — E782 Mixed hyperlipidemia: Secondary | ICD-10-CM

## 2022-05-08 DIAGNOSIS — I4821 Permanent atrial fibrillation: Secondary | ICD-10-CM | POA: Diagnosis not present

## 2022-05-08 NOTE — Patient Instructions (Signed)
Medication Instructions:  The current medical regimen is effective;  continue present plan and medications as directed. Please refer to the Current Medication list given to you today.  *If you need a refill on your cardiac medications before your next appointment, please call your pharmacy*   Lab Work: NONE  Other Instructions PLEASE READ AND FOLLOW ATTACHED SALTY 6  MAINTAIN PHYSICAL ACTIVITY AS TOLERATED  Follow-Up: At [facility], you and your health needs are our priority.  As part of our continuing mission to provide you with exceptional heart care, we have created designated Provider Care Teams.  These Care Teams include your primary Cardiologist (physician) and Advanced Practice Providers (APPs -  Physician Assistants and Nurse Practitioners) who all work together to provide you with the care you need, when you need it.  We recommend signing up for the patient portal called "MyChart".  Sign up information is provided on this After Visit Summary.  MyChart is used to connect with patients for Virtual Visits (Telemedicine).  Patients are able to view lab/test results, encounter notes, upcoming appointments, etc.  Non-urgent messages can be sent to your provider as well.   To learn more about what you can do with MyChart, go to [email or web address].    Your next appointment:   6 month(s)  The format for your next appointment:   In Person  Provider:   Evalina Field, MD     Important Information About Sugar

## 2022-06-07 ENCOUNTER — Ambulatory Visit (INDEPENDENT_AMBULATORY_CARE_PROVIDER_SITE_OTHER): Payer: Medicare Other

## 2022-06-07 DIAGNOSIS — I428 Other cardiomyopathies: Secondary | ICD-10-CM | POA: Diagnosis not present

## 2022-06-08 LAB — CUP PACEART REMOTE DEVICE CHECK
Battery Remaining Longevity: 80 mo
Battery Remaining Percentage: 76 %
Battery Voltage: 2.99 V
Date Time Interrogation Session: 20231026020104
HighPow Impedance: 90 Ohm
Implantable Lead Connection Status: 753985
Implantable Lead Connection Status: 753985
Implantable Lead Implant Date: 20211025
Implantable Lead Implant Date: 20211025
Implantable Lead Location: 753858
Implantable Lead Location: 753860
Implantable Pulse Generator Implant Date: 20211025
Lead Channel Impedance Value: 1525 Ohm
Lead Channel Impedance Value: 460 Ohm
Lead Channel Pacing Threshold Amplitude: 0.75 V
Lead Channel Pacing Threshold Amplitude: 0.875 V
Lead Channel Pacing Threshold Pulse Width: 0.5 ms
Lead Channel Pacing Threshold Pulse Width: 0.5 ms
Lead Channel Sensing Intrinsic Amplitude: 12 mV
Lead Channel Setting Pacing Amplitude: 1.375
Lead Channel Setting Pacing Amplitude: 1.75 V
Lead Channel Setting Pacing Pulse Width: 0.5 ms
Lead Channel Setting Pacing Pulse Width: 0.5 ms
Lead Channel Setting Sensing Sensitivity: 0.5 mV
Pulse Gen Serial Number: 810006964

## 2022-06-11 DIAGNOSIS — Z23 Encounter for immunization: Secondary | ICD-10-CM | POA: Diagnosis not present

## 2022-06-14 NOTE — Progress Notes (Signed)
Remote ICD transmission.   

## 2022-06-18 ENCOUNTER — Ambulatory Visit (INDEPENDENT_AMBULATORY_CARE_PROVIDER_SITE_OTHER): Payer: Medicare Other | Admitting: Podiatry

## 2022-06-18 ENCOUNTER — Encounter: Payer: Self-pay | Admitting: Podiatry

## 2022-06-18 DIAGNOSIS — B351 Tinea unguium: Secondary | ICD-10-CM | POA: Diagnosis not present

## 2022-06-18 DIAGNOSIS — M79674 Pain in right toe(s): Secondary | ICD-10-CM

## 2022-06-18 DIAGNOSIS — M79675 Pain in left toe(s): Secondary | ICD-10-CM | POA: Diagnosis not present

## 2022-06-18 DIAGNOSIS — E119 Type 2 diabetes mellitus without complications: Secondary | ICD-10-CM

## 2022-06-18 NOTE — Progress Notes (Signed)
  Subjective:  Patient ID: Melissa Bishop, female    DOB: April 05, 1946,  MRN: [id number]  Melissa Bishop presents to clinic today for preventative diabetic foot care and painful, discolored, thick toenails which interfere with daily activities  Chief Complaint  Patient presents with   Nail Problem    RFC Bilateral nail trim 1-5    New problem(s): None.   PCP is [name], MD , and last visit was August 21, 2021.  No Known Allergies  Review of Systems: Negative except as noted in the HPI.  Objective: No changes noted in today's physical examination.  Melissa Bishop is a pleasant 76 y.o. female obese in NAD. AAO x 3.  Vascular Examination: CFT <3 seconds b/l LE. Palpable DP/PT pulses b/l LE. Digital hair sparse b/l. Skin temperature gradient WNL b/l. No pain with calf compression b/l. No edema noted b/l. No cyanosis or clubbing noted b/l LE.  Dermatological Examination: No open wounds b/l LE. No interdigital macerations noted b/l LE. Toenails 1-5 b/l elongated, discolored, dystrophic, thickened, crumbly with subungual debris and tenderness to dorsal palpation. Pedal skin dry and cracked bilateral heels.  Musculoskeletal Examination: Normal muscle strength 5/5 to all lower extremity muscle groups bilaterally. No pain, crepitus or joint limitation noted with ROM b/l LE. No gross bony pedal deformities b/l. Patient ambulates independently without assistive aids.  Neurological Examination: Protective sensation intact 5/5 intact bilaterally with 10g monofilament b/l. Vibratory sensation intact b/l.  Assessment/Plan: 1. Pain due to onychomycosis of toenails of both feet   2. Controlled type 2 diabetes mellitus without complication, without long-term current use of insulin ([place])     No orders of the defined types were placed in this encounter.   -Consent given for treatment as described below: -Continue diabetic foot care principles: inspect feet daily, monitor glucose as recommended by  PCP and/or Endocrinologist, and follow prescribed diet per PCP, Endocrinologist and/or dietician. -Continue supportive shoe gear daily. -Mycotic toenails 1-5 bilaterally were debrided in length and girth with sterile nail nippers and dremel without incident. -Patient/POA to call should there be question/concern in the interim.   Return in about 3 months (around 09/18/2022).  Marzetta Board, DPM

## 2022-06-19 ENCOUNTER — Telehealth: Payer: Self-pay | Admitting: Cardiovascular Disease

## 2022-06-19 NOTE — Telephone Encounter (Signed)
  Robyn calling concerning pt's weight, she said pt weight been going up and they are requesting if Dr. Audie Box can order a dietician to visit the pt home. She requested to call her back since pt's mom is hard of hearing and pt will just ignore the call

## 2022-06-19 NOTE — Telephone Encounter (Signed)
Spoke with patient's designated friend who asked for dietary and PT referral. She stated patient now weighs 228 pounds and has to rock 7-8 X before getting out of chair. Recommended that she contact PCP for those orders, esp, with the patient having DM as well as cardiac issues.

## 2022-06-27 ENCOUNTER — Emergency Department (HOSPITAL_COMMUNITY)
Admission: EM | Admit: 2022-06-27 | Discharge: 2022-06-28 | Disposition: A | Discharge status: Home / Self Care | Payer: Medicare Other | Attending: Emergency Medicine | Admitting: Emergency Medicine

## 2022-06-27 ENCOUNTER — Other Ambulatory Visit: Payer: Self-pay

## 2022-06-27 ENCOUNTER — Emergency Department (HOSPITAL_COMMUNITY): Payer: Medicare Other

## 2022-06-27 DIAGNOSIS — Z7984 Long term (current) use of oral hypoglycemic drugs: Secondary | ICD-10-CM | POA: Diagnosis not present

## 2022-06-27 DIAGNOSIS — T68XXXA Hypothermia, initial encounter: Secondary | ICD-10-CM | POA: Diagnosis not present

## 2022-06-27 DIAGNOSIS — Z7901 Long term (current) use of anticoagulants: Secondary | ICD-10-CM | POA: Insufficient documentation

## 2022-06-27 DIAGNOSIS — R519 Headache, unspecified: Secondary | ICD-10-CM | POA: Diagnosis not present

## 2022-06-27 DIAGNOSIS — M25512 Pain in left shoulder: Secondary | ICD-10-CM | POA: Insufficient documentation

## 2022-06-27 DIAGNOSIS — I959 Hypotension, unspecified: Secondary | ICD-10-CM | POA: Diagnosis not present

## 2022-06-27 DIAGNOSIS — E119 Type 2 diabetes mellitus without complications: Secondary | ICD-10-CM | POA: Insufficient documentation

## 2022-06-27 DIAGNOSIS — I10 Essential (primary) hypertension: Secondary | ICD-10-CM | POA: Diagnosis not present

## 2022-06-27 DIAGNOSIS — I4891 Unspecified atrial fibrillation: Secondary | ICD-10-CM | POA: Insufficient documentation

## 2022-06-27 DIAGNOSIS — Z043 Encounter for examination and observation following other accident: Secondary | ICD-10-CM | POA: Diagnosis not present

## 2022-06-27 DIAGNOSIS — W19XXXA Unspecified fall, initial encounter: Secondary | ICD-10-CM

## 2022-06-27 DIAGNOSIS — S0990XA Unspecified injury of head, initial encounter: Secondary | ICD-10-CM | POA: Diagnosis not present

## 2022-06-27 DIAGNOSIS — M79622 Pain in left upper arm: Secondary | ICD-10-CM | POA: Diagnosis not present

## 2022-06-27 DIAGNOSIS — M25519 Pain in unspecified shoulder: Secondary | ICD-10-CM | POA: Diagnosis not present

## 2022-06-27 DIAGNOSIS — Z79899 Other long term (current) drug therapy: Secondary | ICD-10-CM | POA: Insufficient documentation

## 2022-06-27 NOTE — ED Provider Notes (Incomplete)
[facility] DEPT Provider Note   CSN: [id number] Arrival date & time: 06/27/22  2210     History {Add pertinent medical, surgical, social history, OB history to HPI:1} Chief Complaint  Patient presents with  . Fall    Melissa Bishop is a 76 y.o. female.  Patient with history of afib on Elliquis, hypertension, T2DM, HFrEF presents today with complaints of fall. She states that earlier this evening she was attempting to recline her chair to go to sleep and slid down in the chair and hit her left shoulder. She states that she did not hit her head or loose consciousness. Endorses pain to her left shoulder and upper arm since the incident. No other injuries or complaints. She has not attempted to ambulate since the event. Denies fevers, chills, chest pain, shortness of breath, nausea, or vomiting.  The history is provided by the patient. No language interpreter was used.  Fall       Home Medications Prior to Admission medications   Medication Sig Start Date End Date Taking? Authorizing Provider  apixaban (ELIQUIS) 5 MG TABS tablet Take 1 tablet (5 mg total) by mouth 2 (two) times daily. 04/22/20   O'NealCassie Freer, MD  atorvastatin (LIPITOR) 80 MG tablet Take 80 mg by mouth at bedtime.  12/24/19   [provider]  Cholecalciferol (VITAMIN D3) 50 MCG (2000 UT) TABS Take 2,000 Units by mouth daily.    [provider]  ezetimibe (ZETIA) 10 MG tablet Take 10 mg by mouth daily.    [provider]  furosemide (LASIX) 40 MG tablet As needed for LE/ankle swelling 03/26/22   Eulogio Bear U, DO  JARDIANCE 10 MG TABS tablet TAKE 1 TABLET BY MOUTH EVERY DAY BEFORE BREAKFAST 05/03/22   O'Neal, Cassie Freer, MD  metFORMIN (GLUCOPHAGE-XR) 750 MG 24 hr tablet Take 750 mg by mouth 2 (two) times daily with a meal. 11/03/21   [provider]  metoprolol succinate (TOPROL-XL) 50 MG 24 hr tablet Take 1 tablet (50 mg total) by mouth daily.  03/26/22   Geradine Girt, DO  Multiple Vitamins-Minerals (CENTRUM SILVER 50+WOMEN) TABS Take 1 tablet by mouth daily with breakfast.    [provider]  Multiple Vitamins-Minerals (PRESERVISION AREDS 2) CAPS Take 1 capsule by mouth daily after breakfast.    [provider]  spironolactone (ALDACTONE) 25 MG tablet TAKE 1/2 TABLET BY MOUTH EVERY DAY 05/03/22   O'Neal, Cassie Freer, MD  STELARA 45 MG/0.5ML SOSY injection Inject into the skin every 3 (three) months. 06/15/20   [provider]      Allergies    Patient has no known allergies.    Review of Systems   Review of Systems  Musculoskeletal:  Positive for arthralgias.  All other systems reviewed and are negative.   Physical Exam Updated Vital Signs BP (!) 142/74 (BP Location: Left Arm)   Pulse 73   Temp 98.8 F (37.1 C) (Oral)   Resp 18   SpO2 97%  Physical Exam  ED Results / Procedures / Treatments   Labs (all labs ordered are listed, but only abnormal results are displayed) Labs Reviewed - No data to display  EKG None  Radiology DG Humerus Left  Result Date: 06/27/2022 CLINICAL DATA:  Fall EXAM: LEFT HUMERUS - 2+ VIEW COMPARISON:  None Available. FINDINGS: There is no evidence of fracture or other focal bone lesions. Soft tissues are unremarkable. IMPRESSION: Negative. Electronically Signed   By: Madie Reno.D.  On: 06/27/2022 23:19   DG Shoulder Left  Result Date: 06/27/2022 CLINICAL DATA:  Fall EXAM: LEFT SHOULDER - 2+ VIEW COMPARISON:  None Available. FINDINGS: Mild AC joint and glenohumeral degenerative change. No fracture or malalignment IMPRESSION: Mild degenerative changes. No acute osseous abnormality. Electronically Signed   By: Donavan Foil M.D.   On: 06/27/2022 23:19    Procedures Procedures  {Document cardiac monitor, telemetry assessment procedure when appropriate:1}  Medications Ordered in ED Medications - No data to display  ED Course/ Medical Decision Making/  A&P                           Medical Decision Making Amount and/or Complexity of Data Reviewed Radiology: ordered.   ***  {Document critical care time when appropriate:1} {Document review of labs and clinical decision tools ie heart score, Chads2Vasc2 etc:1}  {Document your independent review of radiology images, and any outside records:1} {Document your discussion with family members, caretakers, and with consultants:1} {Document social determinants of health affecting pt's care:1} {Document your decision making why or why not admission, treatments were needed:1} Final Clinical Impression(s) / ED Diagnoses Final diagnoses:  None    Rx / DC Orders ED Discharge Orders     None

## 2022-06-27 NOTE — ED Triage Notes (Signed)
Pt BIB EMS from home c/o fall. Per report pt tried to recline her chair and slid down in the floor. Pt c/o shoulder pain. No injury noted. No LOC reported.

## 2022-06-27 NOTE — ED Provider Notes (Signed)
[facility] DEPT Provider Note   CSN: [id number] Arrival date & time: 06/27/22  2210     History  Chief Complaint  Patient presents with   Lytle Michaels    Melissa Bishop is a 76 y.o. female.  Patient with history of afib on Elliquis, hypertension, T2DM, HFrEF presents today with complaints of fall. She states that earlier this evening she was attempting to recline her chair to go to sleep and slid down in the chair and hit her left shoulder. She states that she did not hit her head or loose consciousness. Endorses pain to her left shoulder and upper arm since the incident. No other injuries or complaints. She has not attempted to ambulate since the event. Denies fevers, chills, chest pain, shortness of breath, nausea, or vomiting.  The history is provided by the patient. No language interpreter was used.  Fall       Home Medications Prior to Admission medications   Medication Sig Start Date End Date Taking? Authorizing Provider  apixaban (ELIQUIS) 5 MG TABS tablet Take 1 tablet (5 mg total) by mouth 2 (two) times daily. 04/22/20   O'NealCassie Freer, MD  atorvastatin (LIPITOR) 80 MG tablet Take 80 mg by mouth at bedtime.  12/24/19   [provider]  Cholecalciferol (VITAMIN D3) 50 MCG (2000 UT) TABS Take 2,000 Units by mouth daily.    [provider]  ezetimibe (ZETIA) 10 MG tablet Take 10 mg by mouth daily.    [provider]  furosemide (LASIX) 40 MG tablet As needed for LE/ankle swelling 03/26/22   Eulogio Bear U, DO  JARDIANCE 10 MG TABS tablet TAKE 1 TABLET BY MOUTH EVERY DAY BEFORE BREAKFAST 05/03/22   O'Neal, Cassie Freer, MD  metFORMIN (GLUCOPHAGE-XR) 750 MG 24 hr tablet Take 750 mg by mouth 2 (two) times daily with a meal. 11/03/21   [provider]  metoprolol succinate (TOPROL-XL) 50 MG 24 hr tablet Take 1 tablet (50 mg total) by mouth daily. 03/26/22   Geradine Girt, DO  Multiple Vitamins-Minerals (CENTRUM  SILVER 50+WOMEN) TABS Take 1 tablet by mouth daily with breakfast.    [provider]  Multiple Vitamins-Minerals (PRESERVISION AREDS 2) CAPS Take 1 capsule by mouth daily after breakfast.    [provider]  spironolactone (ALDACTONE) 25 MG tablet TAKE 1/2 TABLET BY MOUTH EVERY DAY 05/03/22   O'Neal, Cassie Freer, MD  STELARA 45 MG/0.5ML SOSY injection Inject into the skin every 3 (three) months. 06/15/20   [provider]      Allergies    Patient has no known allergies.    Review of Systems   Review of Systems  Musculoskeletal:  Positive for arthralgias.  All other systems reviewed and are negative.   Physical Exam Updated Vital Signs BP (!) 142/74 (BP Location: Left Arm)   Pulse 73   Temp 98.8 F (37.1 C) (Oral)   Resp 18   SpO2 97%  Physical Exam Vitals and nursing note reviewed.  Constitutional:      General: She is not in acute distress.    Appearance: Normal appearance. She is normal weight. She is not ill-appearing, toxic-appearing or diaphoretic.  HENT:     Head: Normocephalic and atraumatic.  Eyes:     Extraocular Movements: Extraocular movements intact.     Pupils: Pupils are equal, round, and reactive to light.  Neck:     Comments: No cervical spine tenderness Cardiovascular:     Rate and Rhythm: Normal  rate and regular rhythm.     Heart sounds: Normal heart sounds.  Pulmonary:     Effort: Pulmonary effort is normal. No respiratory distress.     Breath sounds: Normal breath sounds.  Abdominal:     General: Abdomen is flat.     Palpations: Abdomen is soft.  Musculoskeletal:        General: Normal range of motion.     Cervical back: Normal range of motion and neck supple.     Right lower leg: No edema.     Left lower leg: No edema.     Comments: Mild tenderness over the left humoral head without deformity, crepitus, swelling, or bruising. Radial pulse intact and 2+. ROM intact with some discomfort.   No tenderness to palpation of  cervical, thoracic, or lumbar spine.  No step-offs, lesions, or deformity.  No bony tenderness throughout bilateral lower extremities.  Skin:    General: Skin is warm and dry.  Neurological:     General: No focal deficit present.     Mental Status: She is alert and oriented to person, place, and time.  Psychiatric:        Mood and Affect: Mood normal.        Behavior: Behavior normal.     ED Results / Procedures / Treatments   Labs (all labs ordered are listed, but only abnormal results are displayed) Labs Reviewed - No data to display  EKG None  Radiology CT Head Wo Contrast  Result Date: 06/28/2022 CLINICAL DATA:  Head trauma, minor (Age >= 65y) EXAM: CT HEAD WITHOUT CONTRAST TECHNIQUE: Contiguous axial images were obtained from the base of the skull through the vertex without intravenous contrast. RADIATION DOSE REDUCTION: This exam was performed according to the departmental dose-optimization program which includes automated exposure control, adjustment of the mA and/or kV according to patient size and/or use of iterative reconstruction technique. COMPARISON:  CT head 03/23/2022 FINDINGS: Brain: No evidence of large-territorial acute infarction. No parenchymal hemorrhage. No mass lesion. No extra-axial collection. No mass effect or midline shift. No hydrocephalus. Basilar cisterns are patent. Vascular: No hyperdense vessel. Skull: No acute fracture or focal lesion. Sinuses/Orbits: Right sphenoid sinus polypoid mucosal thickening. Otherwise paranasal sinuses and mastoid air cells are clear. Bilateral lens replacement. Otherwise the orbits are unremarkable. Other: None. IMPRESSION: No acute intracranial abnormality. Electronically Signed   By: Iven Finn M.D.   On: 06/28/2022 00:02   DG Humerus Left  Result Date: 06/27/2022 CLINICAL DATA:  Fall EXAM: LEFT HUMERUS - 2+ VIEW COMPARISON:  None Available. FINDINGS: There is no evidence of fracture or other focal bone lesions. Soft  tissues are unremarkable. IMPRESSION: Negative. Electronically Signed   By: Donavan Foil M.D.   On: 06/27/2022 23:19   DG Shoulder Left  Result Date: 06/27/2022 CLINICAL DATA:  Fall EXAM: LEFT SHOULDER - 2+ VIEW COMPARISON:  None Available. FINDINGS: Mild AC joint and glenohumeral degenerative change. No fracture or malalignment IMPRESSION: Mild degenerative changes. No acute osseous abnormality. Electronically Signed   By: Donavan Foil M.D.   On: 06/27/2022 23:19    Procedures Procedures    Medications Ordered in ED Medications - No data to display  ED Course/ Medical Decision Making/ A&P                           Medical Decision Making Amount and/or Complexity of Data Reviewed Radiology: ordered.   Patient presents today with complaints of fall.  She is afebrile, nontoxic-appearing, and in no acute distress with reassuring vital signs.  Physical exam reveals mild tenderness over the left humeral head.  No other areas of pain or tenderness.  No other signs of injury.  X-ray imaging obtained which revealed no acute findings. CT imaging of the head also obtained given patient's on Eliquis.  This was also unremarkable.  I have personally reviewed and interpreted this imaging and agree with radiology interpretation. After imaging, patient is able to ambulate per her baseline. She is stable to be discharged. Offered pain medication which she declined. Patient is understanding and amenable with plan, educated on red flag symptoms that would prompt immediate return, discharged in stable condition.    This is a shared visit with supervising physician Dr. Randal Buba who has independently evaluated patient & provided guidance in evaluation/management/disposition, in agreement with care    Final Clinical Impression(s) / ED Diagnoses Final diagnoses:  Fall, initial encounter    Rx / DC Orders ED Discharge Orders     None     An After Visit Summary was printed and given to the  patient.     Nestor Lewandowsky 06/28/22 Valrie Hart, April, MD 06/28/22 [id number]

## 2022-06-28 NOTE — Discharge Instructions (Signed)
As we discussed, your work-up in the ER is reassuring for acute findings. X-ray and CT imaging of your shoulder and head are unremarkable for emergent concerns. Follow-up with your pcp for continued evaluation and management of your symptoms  Return if development of any new or worsening symptoms.

## 2022-07-27 DIAGNOSIS — H35372 Puckering of macula, left eye: Secondary | ICD-10-CM | POA: Diagnosis not present

## 2022-07-27 DIAGNOSIS — E119 Type 2 diabetes mellitus without complications: Secondary | ICD-10-CM | POA: Diagnosis not present

## 2022-07-27 DIAGNOSIS — H18593 Other hereditary corneal dystrophies, bilateral: Secondary | ICD-10-CM | POA: Diagnosis not present

## 2022-07-27 DIAGNOSIS — Z961 Presence of intraocular lens: Secondary | ICD-10-CM | POA: Diagnosis not present

## 2022-07-27 DIAGNOSIS — D492 Neoplasm of unspecified behavior of bone, soft tissue, and skin: Secondary | ICD-10-CM | POA: Diagnosis not present

## 2022-08-09 ENCOUNTER — Telehealth: Payer: Self-pay | Admitting: Cardiovascular Disease

## 2022-08-09 MED ORDER — FUROSEMIDE 40 MG PO TABS: ORAL_TABLET | ORAL | 0 refills | Status: DC

## 2022-08-09 NOTE — Telephone Encounter (Signed)
*  STAT* If patient is at the pharmacy, call can be transferred to refill team.   1. Which medications need to be refilled? (please list name of each medication and dose if known) furosemide (LASIX) 40 MG tablet   2. Which pharmacy/location (including street and city if local pharmacy) is medication to be sent to?   [id number] [place], [place] - [address]    3. Do they need a 30 day or 90 day supply? [address]

## 2022-08-10 MED ORDER — FUROSEMIDE 40 MG PO TABS: ORAL_TABLET | ORAL | 3 refills | Status: DC

## 2022-08-10 NOTE — Addendum Note (Signed)
Addended by: Lubertha Sayres on: 08/10/2022 02:03 PM   Modules accepted: Orders

## 2022-08-10 NOTE — Telephone Encounter (Signed)
*  STAT* If patient is at the pharmacy, call can be transferred to refill team.   1. Which medications need to be refilled? (please list name of each medication and dose if known)  furosemide (LASIX) 40 MG tablet  2. Which pharmacy/location (including street and city if local pharmacy) is medication to be sent to? [id number]- [place], [place] - [address]  3. Do they need a 30 day or 90 day supply?   90 day supply  Please transfer Rx to the correct pharmacy. Patient will run out of medication during the weekend.

## 2022-08-29 DIAGNOSIS — E785 Hyperlipidemia, unspecified: Secondary | ICD-10-CM | POA: Diagnosis not present

## 2022-08-29 DIAGNOSIS — I5022 Chronic systolic (congestive) heart failure: Secondary | ICD-10-CM | POA: Diagnosis not present

## 2022-08-29 DIAGNOSIS — M810 Age-related osteoporosis without current pathological fracture: Secondary | ICD-10-CM | POA: Diagnosis not present

## 2022-08-29 DIAGNOSIS — L409 Psoriasis, unspecified: Secondary | ICD-10-CM | POA: Diagnosis not present

## 2022-08-29 DIAGNOSIS — G629 Polyneuropathy, unspecified: Secondary | ICD-10-CM | POA: Diagnosis not present

## 2022-08-29 DIAGNOSIS — Z6833 Body mass index (BMI) 33.0-33.9, adult: Secondary | ICD-10-CM | POA: Diagnosis not present

## 2022-08-29 DIAGNOSIS — I4821 Permanent atrial fibrillation: Secondary | ICD-10-CM | POA: Diagnosis not present

## 2022-08-29 DIAGNOSIS — E1159 Type 2 diabetes mellitus with other circulatory complications: Secondary | ICD-10-CM | POA: Diagnosis not present

## 2022-08-29 DIAGNOSIS — B372 Candidiasis of skin and nail: Secondary | ICD-10-CM | POA: Diagnosis not present

## 2022-08-29 DIAGNOSIS — R2689 Other abnormalities of gait and mobility: Secondary | ICD-10-CM | POA: Diagnosis not present

## 2022-08-29 DIAGNOSIS — I10 Essential (primary) hypertension: Secondary | ICD-10-CM | POA: Diagnosis not present

## 2022-08-29 DIAGNOSIS — Z79899 Other long term (current) drug therapy: Secondary | ICD-10-CM | POA: Diagnosis not present

## 2022-09-05 ENCOUNTER — Other Ambulatory Visit: Payer: Self-pay | Admitting: Cardiovascular Disease

## 2022-09-06 ENCOUNTER — Ambulatory Visit (INDEPENDENT_AMBULATORY_CARE_PROVIDER_SITE_OTHER): Payer: Medicare Other

## 2022-09-06 DIAGNOSIS — I428 Other cardiomyopathies: Secondary | ICD-10-CM

## 2022-09-06 LAB — CUP PACEART REMOTE DEVICE CHECK
Battery Remaining Longevity: 76 mo
Battery Remaining Percentage: 73 %
Battery Voltage: 2.99 V
Date Time Interrogation Session: 20240125020014
HighPow Impedance: 86 Ohm
Implantable Lead Connection Status: 753985
Implantable Lead Connection Status: 753985
Implantable Lead Implant Date: 20211025
Implantable Lead Implant Date: 20211025
Implantable Lead Location: 753858
Implantable Lead Location: 753860
Implantable Pulse Generator Implant Date: 20211025
Lead Channel Impedance Value: 1500 Ohm
Lead Channel Impedance Value: 450 Ohm
Lead Channel Pacing Threshold Amplitude: 0.625 V
Lead Channel Pacing Threshold Amplitude: 1.125 V
Lead Channel Pacing Threshold Pulse Width: 0.5 ms
Lead Channel Pacing Threshold Pulse Width: 0.5 ms
Lead Channel Sensing Intrinsic Amplitude: 12 mV
Lead Channel Setting Pacing Amplitude: 1.625
Lead Channel Setting Pacing Amplitude: 1.625
Lead Channel Setting Pacing Pulse Width: 0.5 ms
Lead Channel Setting Pacing Pulse Width: 0.5 ms
Lead Channel Setting Sensing Sensitivity: 0.5 mV
Pulse Gen Serial Number: 810006964

## 2022-09-10 DIAGNOSIS — H9193 Unspecified hearing loss, bilateral: Secondary | ICD-10-CM | POA: Diagnosis not present

## 2022-09-11 DIAGNOSIS — L821 Other seborrheic keratosis: Secondary | ICD-10-CM | POA: Diagnosis not present

## 2022-09-11 DIAGNOSIS — L400 Psoriasis vulgaris: Secondary | ICD-10-CM | POA: Diagnosis not present

## 2022-09-17 DIAGNOSIS — H90A31 Mixed conductive and sensorineural hearing loss, unilateral, right ear with restricted hearing on the contralateral side: Secondary | ICD-10-CM | POA: Diagnosis not present

## 2022-09-17 DIAGNOSIS — H90A22 Sensorineural hearing loss, unilateral, left ear, with restricted hearing on the contralateral side: Secondary | ICD-10-CM | POA: Diagnosis not present

## 2022-09-18 ENCOUNTER — Other Ambulatory Visit: Payer: Self-pay | Admitting: Cardiovascular Disease

## 2022-09-25 NOTE — Progress Notes (Signed)
Remote ICD transmission.   

## 2022-10-01 ENCOUNTER — Telehealth: Payer: Self-pay | Admitting: Cardiovascular Disease

## 2022-10-01 NOTE — Telephone Encounter (Signed)
Pt c/o medication issue:  1. Name of Medication:  JARDIANCE 10 MG TABS tablet apixaban (ELIQUIS) 5 MG TABS tablet  2. How are you currently taking this medication (dosage and times per day)?   3. Are you having a reaction (difficulty breathing--STAT)?   4. What is your medication issue?  Aline Brochure called in on behalf of the patient. She states the patient's Eliquis will cost her $49 and Vania Rea will cost $550. She would like to know if the patient is able to apply for patient assistance.

## 2022-10-01 NOTE — Telephone Encounter (Signed)
Called Shirlean Mylar, left detailed message letting her know she can print the applications off of the websites and the required paperwork will be listed on the forms.

## 2022-10-04 ENCOUNTER — Other Ambulatory Visit: Payer: Self-pay | Admitting: Cardiovascular Disease

## 2022-10-10 ENCOUNTER — Ambulatory Visit (INDEPENDENT_AMBULATORY_CARE_PROVIDER_SITE_OTHER): Payer: Medicare Other | Admitting: Podiatry

## 2022-10-10 VITALS — BP 144/81

## 2022-10-10 DIAGNOSIS — L853 Xerosis cutis: Secondary | ICD-10-CM

## 2022-10-10 DIAGNOSIS — B351 Tinea unguium: Secondary | ICD-10-CM | POA: Diagnosis not present

## 2022-10-10 DIAGNOSIS — L84 Corns and callosities: Secondary | ICD-10-CM | POA: Diagnosis not present

## 2022-10-10 DIAGNOSIS — M79674 Pain in right toe(s): Secondary | ICD-10-CM | POA: Diagnosis not present

## 2022-10-10 DIAGNOSIS — E119 Type 2 diabetes mellitus without complications: Secondary | ICD-10-CM | POA: Diagnosis not present

## 2022-10-10 DIAGNOSIS — M79675 Pain in left toe(s): Secondary | ICD-10-CM

## 2022-10-10 NOTE — Progress Notes (Unsigned)
ANNUAL DIABETIC FOOT EXAM  Subjective: Melissa Bishop presents today {jgcomplaint:23593}.  Chief Complaint  Patient presents with   Nail Problem    [facility] BS-"Can not take blood sugar" A1C-Do not know PCP-Wendy McNeil PCP [phone]    Patient confirms h/o diabetes.  Patient relates {Numbers; 0-100:15068} year h/o diabetes.  Patient denies any h/o foot wounds.  Patient has h/o foot ulcer of {jgPodToeLocator:23637}, which healed via help of ***.  Patient has h/o amputation(s): {jgamp:23617}.  Patient endorses symptoms of foot numbness.   Patient endorses symptoms of foot tingling.  Patient endorses symptoms of burning in feet.  Patient endorses symptoms of pins/needles sensation in feet.  Patient denies any numbness, tingling, burning, or pins/needle sensation in feet.  Patient has been diagnosed with neuropathy and it is managed with {JGNEUROPATHYMEDS:27053}.  Risk factors: {jgriskfactors:24044}.  [name], MD is patient's PCP. Last visit was {Time; dates multiple:15870}***.  Past Medical History:  Diagnosis Date   CHF (congestive heart failure) ([place])    Coronary artery disease    Diabetes mellitus without complication ([place])    High cholesterol    Hypertension    Psoriasis    Patient Active Problem List   Diagnosis Date Noted   Syncope and collapse 03/23/2022   Solitary thyroid nodule 03/23/2022   DNR (do not resuscitate) 03/23/2022   Abnormal gait 03/05/2022   Muscle weakness 03/05/2022   Peripheral polyneuropathy 03/05/2022   Morbid obesity ([place]) 08/29/2021   Neuropathy 08/29/2021   Other thrombophilia ([place]) 08/29/2021   Postmenopausal bleeding 08/29/2021   Recurrent falls 08/29/2021   Vitamin D deficiency 08/29/2021   Abnormal feces 02/08/2021   Age-related osteoporosis without current pathological fracture 02/08/2021   Cognitive impairment 02/08/2021   Colon cancer screening 02/08/2021   Diabetic peripheral neuropathy ([place]) 02/08/2021   Hearing  loss 02/08/2021   Hyperlipidemia 02/08/2021   HFrEF (heart failure with reduced ejection fraction) ([place]) 01/30/2021   Secondary hypercoagulable state ([place]) 06/16/2020   Atrial fibrillation ([place]) 06/06/2020   Persistent atrial fibrillation ([facility])    Acute systolic heart failure ([place])    Atrial fibrillation with rapid ventricular response ([place]) 04/01/2020   Atrial fibrillation with RVR ([place]) 04/01/2020   Psoriasis 04/01/2020   Constipation 04/01/2020   Essential hypertension 04/01/2020   Diabetes mellitus type II, non insulin dependent ([place]) 04/01/2020   Dyspnea 04/01/2020   Past Surgical History:  Procedure Laterality Date   AV NODE ABLATION N/A 07/18/2020   Procedure: AV NODE ABLATION;  Surgeon: Vickie Epley, MD;  Location: [facility];  Service: Cardiovascular;  Laterality: N/A;   BIV ICD INSERTION CRT-D N/A 06/06/2020   Procedure: BIV ICD INSERTION CRT-D;  Surgeon: Vickie Epley, MD;  Location: [facility];  Service: Cardiovascular;  Laterality: N/A;   BREAST EXCISIONAL BIOPSY Right ? more than 30 years   BREAST SURGERY     CARDIOVERSION N/A 04/04/2020   Procedure: CARDIOVERSION;  Surgeon: Jerline Pain, MD;  Location: [facility] ENDOSCOPY;  Service: Cardiovascular;  Laterality: N/A;   CARDIOVERSION N/A 05/10/2020   Procedure: CARDIOVERSION;  Surgeon: [name], MD;  Location: [facility];  Service: Cardiovascular;  Laterality: N/A;   TEE WITHOUT CARDIOVERSION N/A 04/04/2020   Procedure: TRANSESOPHAGEAL ECHOCARDIOGRAM (TEE);  Surgeon: Jerline Pain, MD;  Location: [facility] ENDOSCOPY;  Service: Cardiovascular;  Laterality: N/A;   Current Outpatient Medications on File Prior to Visit  Medication Sig Dispense Refill   apixaban (ELIQUIS) 5 MG TABS tablet Take 1 tablet (5 mg total) by mouth 2 (  two) times daily. 90 tablet 1   atorvastatin (LIPITOR) 80 MG tablet Take 80 mg by mouth at bedtime.      Cholecalciferol (VITAMIN D3) 50 MCG (2000 UT) TABS Take 2,000 Units by  mouth daily.     ezetimibe (ZETIA) 10 MG tablet Take 10 mg by mouth daily.     furosemide (LASIX) 40 MG tablet AS NEEDED FOR LE/ANKLE SWELLING 90 tablet 1   JARDIANCE 10 MG TABS tablet TAKE 1 TABLET BY MOUTH EVERY DAY BEFORE BREAKFAST 30 tablet 2   metFORMIN (GLUCOPHAGE-XR) 750 MG 24 hr tablet Take 750 mg by mouth 2 (two) times daily with a meal.     metoprolol succinate (TOPROL-XL) 50 MG 24 hr tablet Take 1 tablet (50 mg total) by mouth daily. 30 tablet 0   Multiple Vitamins-Minerals (CENTRUM SILVER 50+WOMEN) TABS Take 1 tablet by mouth daily with breakfast.     Multiple Vitamins-Minerals (PRESERVISION AREDS 2) CAPS Take 1 capsule by mouth daily after breakfast.     spironolactone (ALDACTONE) 25 MG tablet TAKE 1/2 TABLET BY MOUTH EVERY DAY 45 tablet 3   STELARA 45 MG/0.5ML SOSY injection Inject into the skin every 3 (three) months.     No current facility-administered medications on file prior to visit.    No Known Allergies Social History   Occupational History   Not on file  Tobacco Use   Smoking status: Former   Smokeless tobacco: Never  Vaping Use   Vaping Use: Never used  Substance and Sexual Activity   Alcohol use: No   Drug use: No   Sexual activity: Not on file   Family History  Problem Relation Age of Onset   Heart disease Mother        Required pacemaker in her 37s   Immunization History  Administered Date(s) Administered   PFIZER(Purple Top)SARS-COV-2 Vaccination 09/04/2019, 09/25/2019     Review of Systems: Negative except as noted in the HPI.   Objective: Vitals:   10/10/22 1447  BP: (!) 144/81    Melissa Bishop is a pleasant 77 y.o. female in NAD. AAO X 3.  Vascular Examination: {jgvascular:23595}  Dermatological Examination: {jgderm:23598}  Neurological Examination: {jgneuro:23601::"Protective sensation intact 5/5 intact bilaterally with 10g monofilament b/l.","Vibratory sensation intact b/l.","Proprioception intact  bilaterally."}  Musculoskeletal Examination: {jgmsk:23600}  Footwear Assessment: Does the patient wear appropriate shoes? {Yes,No}. Does the patient need inserts/orthotics? {Yes,No}.  Lab Results  Component Value Date   HGBA1C 6.1 (H) 03/23/2022   No results found. ADA Risk Categorization: Low Risk :  Patient has all of the following: Intact protective sensation No prior foot ulcer  No severe deformity Pedal pulses present  High Risk  Patient has one or more of the following: Loss of protective sensation Absent pedal pulses Severe Foot deformity History of foot ulcer  Assessment: No diagnosis found.   Plan: No orders of the defined types were placed in this encounter.   No orders of the defined types were placed in this encounter.   None  {jgplan:23602::"-Patient/POA to call should there be question/concern in the interim."} No follow-ups on file.  Marzetta Board, DPM

## 2022-10-11 ENCOUNTER — Encounter: Payer: Self-pay | Admitting: Podiatry

## 2022-10-26 DIAGNOSIS — E119 Type 2 diabetes mellitus without complications: Secondary | ICD-10-CM | POA: Diagnosis not present

## 2022-10-26 DIAGNOSIS — H35372 Puckering of macula, left eye: Secondary | ICD-10-CM | POA: Diagnosis not present

## 2022-10-26 DIAGNOSIS — Z961 Presence of intraocular lens: Secondary | ICD-10-CM | POA: Diagnosis not present

## 2022-10-26 DIAGNOSIS — Z83518 Family history of other specified eye disorder: Secondary | ICD-10-CM | POA: Diagnosis not present

## 2022-11-04 NOTE — Progress Notes (Unsigned)
Cardiology Office Note:   Date:  11/06/2022  NAME:  Melissa Bishop    MRN: [id number] DOB:  1946-03-14   PCP:  [name], MD  Cardiologist:  Evalina Field, MD  Electrophysiologist:  Vickie Epley, MD   Referring MD: [name], MD   Chief Complaint  Patient presents with   Follow-up         History of Present Illness:   Melissa Bishop is a 77 y.o. female with a hx of CHF, Afib, DM, HLD who presents for follow-up.  She reports she is doing well.  Presents with a friend.  She reports no chest pains or trouble breathing.  No symptoms of heart failure.  Weights are stable.  Current regimen includes metoprolol succinate 50 mg daily, Aldactone 12.5 mg daily, Jardiance 10 mg daily and Lasix as needed.  No overt swelling.  On Eliquis 5 mg twice daily.  No significant bleeding.  Overall seems to be doing quite well.  She is forgetful of medications at times.  She is doing the best she can.  Ejection fraction has returned to normal.  Problem List  1. Atrial fibrillation, persistent -TEE/DCCV 04/05/2020 failed -converted to NSR on amiodarone  -recurrence on amiodarone  -s/p AVN ablation 07/18/2020 2. Systolic HF -EF [id number] -EF 25-30% 10/21/2020 -arrhythmia related  -CRT-D 05/2020 -s/p AVN ablation 07/18/2020 -EF 60-65% 08/24/2021 3. Diabetes -A1c 6.3 4. HLD -T chol 162, HDL 50, LDL 94, TG 101  Past Medical History: Past Medical History:  Diagnosis Date   CHF (congestive heart failure) (HCC)    Coronary artery disease    Diabetes mellitus without complication ([place])    High cholesterol    Hypertension    Psoriasis     Past Surgical History: Past Surgical History:  Procedure Laterality Date   AV NODE ABLATION N/A 07/18/2020   Procedure: AV NODE ABLATION;  Surgeon: Vickie Epley, MD;  Location: [facility];  Service: Cardiovascular;  Laterality: N/A;   BIV ICD INSERTION CRT-D N/A 06/06/2020   Procedure: BIV ICD INSERTION CRT-D;  Surgeon: Vickie Epley, MD;  Location: [facility];  Service: Cardiovascular;  Laterality: N/A;   BREAST EXCISIONAL BIOPSY Right ? more than 30 years   BREAST SURGERY     CARDIOVERSION N/A 04/04/2020   Procedure: CARDIOVERSION;  Surgeon: Jerline Pain, MD;  Location: [facility];  Service: Cardiovascular;  Laterality: N/A;   CARDIOVERSION N/A 05/10/2020   Procedure: CARDIOVERSION;  Surgeon: [name], MD;  Location: [facility];  Service: Cardiovascular;  Laterality: N/A;   TEE WITHOUT CARDIOVERSION N/A 04/04/2020   Procedure: TRANSESOPHAGEAL ECHOCARDIOGRAM (TEE);  Surgeon: Jerline Pain, MD;  Location: [facility];  Service: Cardiovascular;  Laterality: N/A;    Current Medications: Current Meds  Medication Sig   apixaban (ELIQUIS) 5 MG TABS tablet Take 1 tablet (5 mg total) by mouth 2 (two) times daily.   atorvastatin (LIPITOR) 80 MG tablet Take 80 mg by mouth at bedtime.    Cholecalciferol (VITAMIN D3) 50 MCG (2000 UT) TABS Take 2,000 Units by mouth daily.   ezetimibe (ZETIA) 10 MG tablet Take 10 mg by mouth daily.   furosemide (LASIX) 40 MG tablet AS NEEDED FOR LE/ANKLE SWELLING   JARDIANCE 10 MG TABS tablet TAKE 1 TABLET BY MOUTH EVERY DAY BEFORE BREAKFAST   metFORMIN (GLUCOPHAGE-XR) 750 MG 24 hr tablet Take 750 mg by mouth 2 (two) times daily with a meal.   metoprolol succinate (TOPROL-XL) 50 MG  24 hr tablet Take 1 tablet (50 mg total) by mouth daily.   Multiple Vitamins-Minerals (CENTRUM SILVER 50+WOMEN) TABS Take 1 tablet by mouth daily with breakfast.   Multiple Vitamins-Minerals (PRESERVISION AREDS 2) CAPS Take 1 capsule by mouth daily after breakfast.   spironolactone (ALDACTONE) 25 MG tablet TAKE 1/2 TABLET BY MOUTH EVERY DAY   STELARA 45 MG/0.5ML SOSY injection Inject into the skin every 3 (three) months.     Allergies:    Patient has no known allergies.   Social History: Social History   Socioeconomic History   Marital status: Single    Spouse name: Not on file    Number of children: Not on file   Years of education: Not on file   Highest education level: Not on file  Occupational History   Not on file  Tobacco Use   Smoking status: Former   Smokeless tobacco: Never  Vaping Use   Vaping Use: Never used  Substance and Sexual Activity   Alcohol use: No   Drug use: No   Sexual activity: Not on file  Other Topics Concern   Not on file  Social History Narrative   Not on file   Social Determinants of Health   Financial Resource Strain: Not on file  Food Insecurity: Not on file  Transportation Needs: Not on file  Physical Activity: Not on file  Stress: Not on file  Social Connections: Not on file     Family History: The patient's family history includes Heart disease in her mother.  ROS:   All other ROS reviewed and negative. Pertinent positives noted in the HPI.     EKGs/Labs/Other Studies Reviewed:   The following studies were personally reviewed by me today:  TTE 08/24/2021  1. Left ventricular ejection fraction, by estimation, is 60 to 65%. The  left ventricle has normal function. The left ventricle has no regional  wall motion abnormalities. There is mild left ventricular hypertrophy.  Left ventricular diastolic parameters  were normal.   2. Right ventricular systolic function is normal. The right ventricular  size is normal.   3. Right atrial size was mildly dilated.   4. The mitral valve is grossly normal. Trivial mitral valve  regurgitation.   5. The aortic valve is normal in structure. Aortic valve regurgitation is  not visualized.   Recent Labs: 03/23/2022: Magnesium 2.1 03/24/2022: BUN 23; Creatinine, Ser 0.85; Hemoglobin 16.1; Platelets 201; Potassium 4.3; Sodium 140   Recent Lipid Panel    Component Value Date/Time   CHOL 200 04/02/2020 0231   TRIG 104 04/02/2020 0231   HDL 37 (L) 04/02/2020 0231   CHOLHDL 5.4 04/02/2020 0231   VLDL 21 04/02/2020 0231   LDLCALC 142 (H) 04/02/2020 0231    Physical Exam:    VS:  BP 110/80 (BP Location: Left Arm, Patient Position: Sitting, Cuff Size: Large)   Pulse 73   Ht 5\' 11"  (1.803 m)   Wt 222 lb (100.7 kg)   SpO2 98%   BMI 30.96 kg/m    Wt Readings from Last 3 Encounters:  11/06/22 222 lb (100.7 kg)  05/08/22 226 lb 3.2 oz (102.6 kg)  03/26/22 235 lb 0.2 oz (106.6 kg)    General: Well nourished, well developed, in no acute distress Head: Atraumatic, normal size  Eyes: PEERLA, EOMI  Neck: Supple, no JVD Endocrine: No thryomegaly Cardiac: Normal S1, S2; RRR; no murmurs, rubs, or gallops Lungs: Clear to auscultation bilaterally, no wheezing, rhonchi or rales  Abd: Soft,  nontender, no hepatomegaly  Ext: No edema, pulses 2+ Musculoskeletal: No deformities, BUE and BLE strength normal and equal Skin: Warm and dry, no rashes   Neuro: Alert and oriented to person, place, time, and situation, CNII-XII grossly intact, no focal deficits  Psych: Normal mood and affect   ASSESSMENT:   Melissa Bishop is a 77 y.o. female who presents for the following: 1. Permanent atrial fibrillation ([place])   2. Acquired thrombophilia ([place])   3. Chronic systolic heart failure ([place])   4. NICM (nonischemic cardiomyopathy) ([place])   5. Mixed hyperlipidemia   6. Imbalance     PLAN:   1. Permanent atrial fibrillation ([place]) 2. Acquired thrombophilia ([place]) -History of permanent atrial fibrillation.  Failed amiodarone therapy.  Status post AV node ablation with CRT-D. -Overall doing well.  No bleeding on Eliquis.  3. Chronic systolic heart failure ([place]) 4. NICM (nonischemic cardiomyopathy) (HCC) --Ejection fraction has recovered. -Currently on metoprolol succinate 50 mg daily, Aldactone 12.5 mg daily, Jardiance 10 mg daily, Lasix 40 mg daily as needed.  She reports no symptoms of heart failure.  She was on Entresto in the past.  This apparently has fallen off her list.  She is forgetful medications.  Seems without heart failure symptoms.  For now we will continue with current  regimen.  Can add back as we are not able and needed.  5. Mixed hyperlipidemia -On statin.  LDL 94.  She is diabetic.  On Zetia as well.  Close enough to goal.  6. Imbalance -Referral to physical therapy.  Home health.  Disposition: Return in about 1 year (around 11/06/2023).  Medication Adjustments/Labs and Tests Ordered: Current medicines are reviewed at length with the patient today.  Concerns regarding medicines are outlined above.  Orders Placed This Encounter  Procedures   Ambulatory referral to Physical Therapy   No orders of the defined types were placed in this encounter.   Patient Instructions  Medication Instructions:  The current medical regimen is effective;  continue present plan and medications.  *If you need a refill on your cardiac medications before your next appointment, please call your pharmacy*   Follow-Up: At [facility], you and your health needs are our priority.  As part of our continuing mission to provide you with exceptional heart care, we have created designated Provider Care Teams.  These Care Teams include your primary Cardiologist (physician) and Advanced Practice Providers (APPs -  Physician Assistants and Nurse Practitioners) who all work together to provide you with the care you need, when you need it.  We recommend signing up for the patient portal called "MyChart".  Sign up information is provided on this After Visit Summary.  MyChart is used to connect with patients for Virtual Visits (Telemedicine).  Patients are able to view lab/test results, encounter notes, upcoming appointments, etc.  Non-urgent messages can be sent to your provider as well.   To learn more about what you can do with MyChart, go to [email or web address].    Your next appointment:   12 month(s)  Provider:   Evalina Field, MD       Time Spent with Patient: I have spent a total of 25 minutes with patient reviewing hospital notes, telemetry, EKGs, labs and  examining the patient as well as establishing an assessment and plan that was discussed with the patient.  > 50% of time was spent in direct patient care.  Signed, Addison Naegeli. Audie Box, MD, [place]  [address]  [place], [place] [address] [phone]  11/06/2022 2:14 PM

## 2022-11-06 ENCOUNTER — Ambulatory Visit: Payer: Medicare Other | Attending: Cardiovascular Disease | Admitting: Cardiovascular Disease

## 2022-11-06 ENCOUNTER — Encounter: Payer: Self-pay | Admitting: Cardiovascular Disease

## 2022-11-06 VITALS — BP 110/80 | HR 73 | Ht 71.0 in | Wt 222.0 lb

## 2022-11-06 DIAGNOSIS — I5022 Chronic systolic (congestive) heart failure: Secondary | ICD-10-CM | POA: Diagnosis not present

## 2022-11-06 DIAGNOSIS — R2689 Other abnormalities of gait and mobility: Secondary | ICD-10-CM | POA: Diagnosis not present

## 2022-11-06 DIAGNOSIS — I4821 Permanent atrial fibrillation: Secondary | ICD-10-CM | POA: Diagnosis not present

## 2022-11-06 DIAGNOSIS — E782 Mixed hyperlipidemia: Secondary | ICD-10-CM | POA: Diagnosis not present

## 2022-11-06 DIAGNOSIS — D6869 Other thrombophilia: Secondary | ICD-10-CM | POA: Insufficient documentation

## 2022-11-06 DIAGNOSIS — I428 Other cardiomyopathies: Secondary | ICD-10-CM | POA: Diagnosis not present

## 2022-11-06 NOTE — Patient Instructions (Signed)
Medication Instructions:  The current medical regimen is effective;  continue present plan and medications.  *If you need a refill on your cardiac medications before your next appointment, please call your pharmacy*   Follow-Up: At [facility], you and your health needs are our priority.  As part of our continuing mission to provide you with exceptional heart care, we have created designated Provider Care Teams.  These Care Teams include your primary Cardiologist (physician) and Advanced Practice Providers (APPs -  Physician Assistants and Nurse Practitioners) who all work together to provide you with the care you need, when you need it.  We recommend signing up for the patient portal called "MyChart".  Sign up information is provided on this After Visit Summary.  MyChart is used to connect with patients for Virtual Visits (Telemedicine).  Patients are able to view lab/test results, encounter notes, upcoming appointments, etc.  Non-urgent messages can be sent to your provider as well.   To learn more about what you can do with MyChart, go to [email or web address].    Your next appointment:   12 month(s)  Provider:   [place] T O'Neal, MD   

## 2022-11-12 ENCOUNTER — Telehealth: Payer: Self-pay | Admitting: Cardiovascular Disease

## 2022-11-12 NOTE — Telephone Encounter (Signed)
Left message for Melissa Bishop to call back.

## 2022-11-12 NOTE — Telephone Encounter (Signed)
Patient's friend states the patient was supposed to have physical therapy set up for someone to come in her home. She says the patient has a form on her cell phone and does not have a printer and cannot understand it. She requests the form be mailed to the patients home. She would like to know if it can be mailed by tomorrow.

## 2022-11-12 NOTE — Telephone Encounter (Signed)
Patient's friend returning call. Phone: [phone]

## 2022-11-12 NOTE — Telephone Encounter (Signed)
Left message for pt's friend to call back.

## 2022-11-12 NOTE — Telephone Encounter (Signed)
Spoke with pt's friend/caregiver, Robyn regarding orders for physical therapy. Bailey Mech says that pt received a message on her phone but is unable to understand and doesn't have a printer. Bailey Mech will look at message tomorrow to see if there is information for company to call to request printed material.

## 2022-11-16 ENCOUNTER — Telehealth: Payer: Self-pay | Admitting: Cardiovascular Disease

## 2022-11-16 NOTE — Telephone Encounter (Signed)
Melissa Bishop is following up, requesting a written letter for physical therapy order if possible. She states this will be the easiest method for the patient.

## 2022-11-16 NOTE — Telephone Encounter (Signed)
Pt's caregiver returning nurses call. Please advise

## 2022-11-16 NOTE — Telephone Encounter (Signed)
Already spoke with patient's caregiver in another phone note encounter.

## 2022-11-16 NOTE — Telephone Encounter (Signed)
Spoke with Melina Schools about PT order. She said patient got a text message from PT(?) with a website to access but they were unable to do so so they have not been able to set anything up  Robyn (caregiver) said that they need to know what PT agency they have referred patient to. They need a letter about this PT referral  Robyn's concerns: 220-226lbs - weight fluctuates Patient sits in a recliner that will assist with standing but mostly sits She cannot stand without assistance She cannot sit in a regular chair - sits in a rollator  Her PCP had referred her to [facility] PT but patient is unable to do outpatient PT d/t transportation   Advised would send to primary nurse and MD for follow up/advice

## 2022-11-16 NOTE — Telephone Encounter (Signed)
LM for Robyn to see if she can provide more info on what is requested in a letter for physical therapy, as the order only states home health PT. Unsure who to address it to/agency

## 2022-11-23 ENCOUNTER — Other Ambulatory Visit: Payer: Self-pay

## 2022-11-23 DIAGNOSIS — R2689 Other abnormalities of gait and mobility: Secondary | ICD-10-CM

## 2022-11-23 NOTE — Telephone Encounter (Signed)
[employer] and she reports- Pt need at home physical therapy and she needs to have someone to come into the home for an assessment for PT. I put her on hold to gather some information  Spoke with Konrad Felix The "referral" has not even been sent yet. Also, there is not any agency/provider specified for this in home PT referral.   I asked the nurse Raynelle Fanning, and she stated that the pt was supposed to get back in touch with [place] to let us know where to send this home health- PT referral/where insurance wants her referred.  Got back on the phone with Melina Schools and told her the information above. She said that this is the first she has heard about needing to get in touch with insurance Company to find out where/what is needed for a home health PT referral.  She said she would try to find out this information and get back in touch with [place].

## 2022-11-23 NOTE — Telephone Encounter (Signed)
Caregiver calling back to speak to the nurse. Please advise

## 2022-11-23 NOTE — Telephone Encounter (Signed)
Called and spoke with Caregiver (okay per DPR). Advised that this was discussed at the visit when patient was here, as I informed them the order was not placed anywhere specific that I would need to know where they would like the referral sent to and I would be glad to send it. Caregiver verbalized understanding, she was not at the visit and was unsure of what was needed. I also advised if home health was needed, we could just place it as a home health referral needing PT. Caregiver advised to do this instead, and to use any company. I advised I had placed the referral while on the phone with her, and that they would be contacting her.   Caregiver verbalized understanding.   HH- PT ordered placed.

## 2022-11-26 ENCOUNTER — Telehealth: Payer: Self-pay | Admitting: Cardiovascular Disease

## 2022-11-26 DIAGNOSIS — R269 Unspecified abnormalities of gait and mobility: Secondary | ICD-10-CM

## 2022-11-26 DIAGNOSIS — M6281 Muscle weakness (generalized): Secondary | ICD-10-CM

## 2022-11-26 DIAGNOSIS — I5020 Unspecified systolic (congestive) heart failure: Secondary | ICD-10-CM

## 2022-11-26 DIAGNOSIS — I5022 Chronic systolic (congestive) heart failure: Secondary | ICD-10-CM

## 2022-11-26 DIAGNOSIS — R4189 Other symptoms and signs involving cognitive functions and awareness: Secondary | ICD-10-CM

## 2022-11-26 DIAGNOSIS — I4891 Unspecified atrial fibrillation: Secondary | ICD-10-CM

## 2022-11-26 NOTE — Telephone Encounter (Signed)
Patient's caregiver called said that Dr. Flora Lipps would approve for her to get a physical therapy. Got information, like a fax number, to give to [facility].

## 2022-11-28 NOTE — Telephone Encounter (Signed)
Patient needs PT for Mobility, Safety awareness, and Status post falls from chair and bed.  She has difficulty with repositioning.  Uses of roll aider. Safety concerns as forgets to call for assistance and has fallen out of bed and reclining chair. She ask to send to [facility] as listed

## 2022-11-28 NOTE — Telephone Encounter (Signed)
To clarify:  Caregiver, Melina Schools, is following up requesting that patient begin in home physical therapy ASAP. She is requesting that Dr. Flora Lipps fax an order for PT to [facility] at fax#: [phone]. If questions, [facility] can be contacted at [phone]. Please contact caregiver to confirm/discuss. She is hopeful to have this processed quickly.

## 2022-11-30 NOTE — Telephone Encounter (Signed)
Referral ordered.  Thanks

## 2022-12-04 DIAGNOSIS — L400 Psoriasis vulgaris: Secondary | ICD-10-CM | POA: Diagnosis not present

## 2022-12-04 DIAGNOSIS — Z79899 Other long term (current) drug therapy: Secondary | ICD-10-CM | POA: Diagnosis not present

## 2022-12-05 ENCOUNTER — Telehealth: Payer: Self-pay | Admitting: Cardiovascular Disease

## 2022-12-05 DIAGNOSIS — Z87891 Personal history of nicotine dependence: Secondary | ICD-10-CM | POA: Diagnosis not present

## 2022-12-05 DIAGNOSIS — I110 Hypertensive heart disease with heart failure: Secondary | ICD-10-CM | POA: Diagnosis not present

## 2022-12-05 DIAGNOSIS — R2689 Other abnormalities of gait and mobility: Secondary | ICD-10-CM | POA: Diagnosis not present

## 2022-12-05 DIAGNOSIS — E782 Mixed hyperlipidemia: Secondary | ICD-10-CM | POA: Diagnosis not present

## 2022-12-05 DIAGNOSIS — I2510 Atherosclerotic heart disease of native coronary artery without angina pectoris: Secondary | ICD-10-CM | POA: Diagnosis not present

## 2022-12-05 DIAGNOSIS — Z7984 Long term (current) use of oral hypoglycemic drugs: Secondary | ICD-10-CM | POA: Diagnosis not present

## 2022-12-05 DIAGNOSIS — I051 Rheumatic mitral insufficiency: Secondary | ICD-10-CM | POA: Diagnosis not present

## 2022-12-05 DIAGNOSIS — Z6830 Body mass index (BMI) 30.0-30.9, adult: Secondary | ICD-10-CM | POA: Diagnosis not present

## 2022-12-05 DIAGNOSIS — D6869 Other thrombophilia: Secondary | ICD-10-CM | POA: Diagnosis not present

## 2022-12-05 DIAGNOSIS — E119 Type 2 diabetes mellitus without complications: Secondary | ICD-10-CM | POA: Diagnosis not present

## 2022-12-05 DIAGNOSIS — I428 Other cardiomyopathies: Secondary | ICD-10-CM | POA: Diagnosis not present

## 2022-12-05 DIAGNOSIS — L409 Psoriasis, unspecified: Secondary | ICD-10-CM | POA: Diagnosis not present

## 2022-12-05 DIAGNOSIS — I5022 Chronic systolic (congestive) heart failure: Secondary | ICD-10-CM | POA: Diagnosis not present

## 2022-12-05 DIAGNOSIS — I4821 Permanent atrial fibrillation: Secondary | ICD-10-CM | POA: Diagnosis not present

## 2022-12-05 DIAGNOSIS — Z7901 Long term (current) use of anticoagulants: Secondary | ICD-10-CM | POA: Diagnosis not present

## 2022-12-05 NOTE — Telephone Encounter (Signed)
Jenel Lucks is calling stating that [facility] is requesting that RN Natalia Leatherwood fax over the following information. Fax Number [phone]  Pt's name: Melissa Bishop DOB Address: [address] [place] [phone] Insurance information Emergency Contact Information Alternative Cont[employer]  Jenel Lucks did state that they did receive the referral this morning. Please advise.

## 2022-12-05 NOTE — Telephone Encounter (Signed)
Called Melissa Bishop and let her know that referral and LOV was faxed to [facility]

## 2022-12-05 NOTE — Telephone Encounter (Signed)
Sent over information via letter format. Faxed to number provided.  Thanks!

## 2022-12-05 NOTE — Telephone Encounter (Signed)
Melissa Bishop is calling back because the referral was not received. Asking that we refax the info over to [facility] [phone] . Please advise

## 2022-12-05 NOTE — Telephone Encounter (Signed)
Referral order and LOV faxed to [facility] Fax [phone]

## 2022-12-06 ENCOUNTER — Ambulatory Visit (INDEPENDENT_AMBULATORY_CARE_PROVIDER_SITE_OTHER): Payer: Medicare Other

## 2022-12-06 DIAGNOSIS — I428 Other cardiomyopathies: Secondary | ICD-10-CM

## 2022-12-06 LAB — CUP PACEART REMOTE DEVICE CHECK
Battery Remaining Longevity: 74 mo
Battery Remaining Percentage: 71 %
Battery Voltage: 2.99 V
Date Time Interrogation Session: 20240425030303
HighPow Impedance: 96 Ohm
Implantable Lead Connection Status: 753985
Implantable Lead Connection Status: 753985
Implantable Lead Implant Date: 20211025
Implantable Lead Implant Date: 20211025
Implantable Lead Location: 753858
Implantable Lead Location: 753860
Implantable Pulse Generator Implant Date: 20211025
Lead Channel Impedance Value: 1500 Ohm
Lead Channel Impedance Value: 450 Ohm
Lead Channel Pacing Threshold Amplitude: 0.625 V
Lead Channel Pacing Threshold Amplitude: 0.875 V
Lead Channel Pacing Threshold Pulse Width: 0.5 ms
Lead Channel Pacing Threshold Pulse Width: 0.5 ms
Lead Channel Sensing Intrinsic Amplitude: 12 mV
Lead Channel Setting Pacing Amplitude: 1.375
Lead Channel Setting Pacing Amplitude: 1.625
Lead Channel Setting Pacing Pulse Width: 0.5 ms
Lead Channel Setting Pacing Pulse Width: 0.5 ms
Lead Channel Setting Sensing Sensitivity: 0.5 mV
Pulse Gen Serial Number: 810006964

## 2022-12-11 DIAGNOSIS — I2510 Atherosclerotic heart disease of native coronary artery without angina pectoris: Secondary | ICD-10-CM | POA: Diagnosis not present

## 2022-12-11 DIAGNOSIS — I110 Hypertensive heart disease with heart failure: Secondary | ICD-10-CM | POA: Diagnosis not present

## 2022-12-11 DIAGNOSIS — R2689 Other abnormalities of gait and mobility: Secondary | ICD-10-CM | POA: Diagnosis not present

## 2022-12-11 DIAGNOSIS — I4821 Permanent atrial fibrillation: Secondary | ICD-10-CM | POA: Diagnosis not present

## 2022-12-11 DIAGNOSIS — I428 Other cardiomyopathies: Secondary | ICD-10-CM | POA: Diagnosis not present

## 2022-12-11 DIAGNOSIS — I5022 Chronic systolic (congestive) heart failure: Secondary | ICD-10-CM | POA: Diagnosis not present

## 2022-12-20 DIAGNOSIS — I4821 Permanent atrial fibrillation: Secondary | ICD-10-CM | POA: Diagnosis not present

## 2022-12-20 DIAGNOSIS — I110 Hypertensive heart disease with heart failure: Secondary | ICD-10-CM | POA: Diagnosis not present

## 2022-12-20 DIAGNOSIS — R2689 Other abnormalities of gait and mobility: Secondary | ICD-10-CM | POA: Diagnosis not present

## 2022-12-20 DIAGNOSIS — I5022 Chronic systolic (congestive) heart failure: Secondary | ICD-10-CM | POA: Diagnosis not present

## 2022-12-20 DIAGNOSIS — I2510 Atherosclerotic heart disease of native coronary artery without angina pectoris: Secondary | ICD-10-CM | POA: Diagnosis not present

## 2022-12-20 DIAGNOSIS — I428 Other cardiomyopathies: Secondary | ICD-10-CM | POA: Diagnosis not present

## 2022-12-27 DIAGNOSIS — I5022 Chronic systolic (congestive) heart failure: Secondary | ICD-10-CM | POA: Diagnosis not present

## 2022-12-27 DIAGNOSIS — I2510 Atherosclerotic heart disease of native coronary artery without angina pectoris: Secondary | ICD-10-CM | POA: Diagnosis not present

## 2022-12-27 DIAGNOSIS — I4821 Permanent atrial fibrillation: Secondary | ICD-10-CM | POA: Diagnosis not present

## 2022-12-27 DIAGNOSIS — I110 Hypertensive heart disease with heart failure: Secondary | ICD-10-CM | POA: Diagnosis not present

## 2022-12-27 DIAGNOSIS — I428 Other cardiomyopathies: Secondary | ICD-10-CM | POA: Diagnosis not present

## 2022-12-27 DIAGNOSIS — R2689 Other abnormalities of gait and mobility: Secondary | ICD-10-CM | POA: Diagnosis not present

## 2022-12-31 ENCOUNTER — Other Ambulatory Visit: Payer: Self-pay | Admitting: Cardiovascular Disease

## 2022-12-31 DIAGNOSIS — I4821 Permanent atrial fibrillation: Secondary | ICD-10-CM | POA: Diagnosis not present

## 2022-12-31 DIAGNOSIS — I2510 Atherosclerotic heart disease of native coronary artery without angina pectoris: Secondary | ICD-10-CM | POA: Diagnosis not present

## 2022-12-31 DIAGNOSIS — R2689 Other abnormalities of gait and mobility: Secondary | ICD-10-CM | POA: Diagnosis not present

## 2022-12-31 DIAGNOSIS — I428 Other cardiomyopathies: Secondary | ICD-10-CM | POA: Diagnosis not present

## 2022-12-31 DIAGNOSIS — I110 Hypertensive heart disease with heart failure: Secondary | ICD-10-CM | POA: Diagnosis not present

## 2022-12-31 DIAGNOSIS — I5022 Chronic systolic (congestive) heart failure: Secondary | ICD-10-CM | POA: Diagnosis not present

## 2023-01-01 NOTE — Progress Notes (Signed)
Remote ICD transmission.   

## 2023-01-04 DIAGNOSIS — Z6830 Body mass index (BMI) 30.0-30.9, adult: Secondary | ICD-10-CM | POA: Diagnosis not present

## 2023-01-04 DIAGNOSIS — I051 Rheumatic mitral insufficiency: Secondary | ICD-10-CM | POA: Diagnosis not present

## 2023-01-04 DIAGNOSIS — L409 Psoriasis, unspecified: Secondary | ICD-10-CM | POA: Diagnosis not present

## 2023-01-04 DIAGNOSIS — I110 Hypertensive heart disease with heart failure: Secondary | ICD-10-CM | POA: Diagnosis not present

## 2023-01-04 DIAGNOSIS — Z7901 Long term (current) use of anticoagulants: Secondary | ICD-10-CM | POA: Diagnosis not present

## 2023-01-04 DIAGNOSIS — Z7984 Long term (current) use of oral hypoglycemic drugs: Secondary | ICD-10-CM | POA: Diagnosis not present

## 2023-01-04 DIAGNOSIS — I5022 Chronic systolic (congestive) heart failure: Secondary | ICD-10-CM | POA: Diagnosis not present

## 2023-01-04 DIAGNOSIS — I428 Other cardiomyopathies: Secondary | ICD-10-CM | POA: Diagnosis not present

## 2023-01-04 DIAGNOSIS — I4821 Permanent atrial fibrillation: Secondary | ICD-10-CM | POA: Diagnosis not present

## 2023-01-04 DIAGNOSIS — E782 Mixed hyperlipidemia: Secondary | ICD-10-CM | POA: Diagnosis not present

## 2023-01-04 DIAGNOSIS — R2689 Other abnormalities of gait and mobility: Secondary | ICD-10-CM | POA: Diagnosis not present

## 2023-01-04 DIAGNOSIS — E119 Type 2 diabetes mellitus without complications: Secondary | ICD-10-CM | POA: Diagnosis not present

## 2023-01-04 DIAGNOSIS — Z87891 Personal history of nicotine dependence: Secondary | ICD-10-CM | POA: Diagnosis not present

## 2023-01-04 DIAGNOSIS — D6869 Other thrombophilia: Secondary | ICD-10-CM | POA: Diagnosis not present

## 2023-01-04 DIAGNOSIS — I2510 Atherosclerotic heart disease of native coronary artery without angina pectoris: Secondary | ICD-10-CM | POA: Diagnosis not present

## 2023-01-16 DIAGNOSIS — R2689 Other abnormalities of gait and mobility: Secondary | ICD-10-CM | POA: Diagnosis not present

## 2023-01-16 DIAGNOSIS — I2510 Atherosclerotic heart disease of native coronary artery without angina pectoris: Secondary | ICD-10-CM | POA: Diagnosis not present

## 2023-01-16 DIAGNOSIS — I5022 Chronic systolic (congestive) heart failure: Secondary | ICD-10-CM | POA: Diagnosis not present

## 2023-01-16 DIAGNOSIS — I4821 Permanent atrial fibrillation: Secondary | ICD-10-CM | POA: Diagnosis not present

## 2023-01-16 DIAGNOSIS — I110 Hypertensive heart disease with heart failure: Secondary | ICD-10-CM | POA: Diagnosis not present

## 2023-01-16 DIAGNOSIS — I428 Other cardiomyopathies: Secondary | ICD-10-CM | POA: Diagnosis not present

## 2023-01-30 DIAGNOSIS — I428 Other cardiomyopathies: Secondary | ICD-10-CM | POA: Diagnosis not present

## 2023-01-30 DIAGNOSIS — I5022 Chronic systolic (congestive) heart failure: Secondary | ICD-10-CM | POA: Diagnosis not present

## 2023-01-30 DIAGNOSIS — R2689 Other abnormalities of gait and mobility: Secondary | ICD-10-CM | POA: Diagnosis not present

## 2023-01-30 DIAGNOSIS — I4821 Permanent atrial fibrillation: Secondary | ICD-10-CM | POA: Diagnosis not present

## 2023-01-30 DIAGNOSIS — I2510 Atherosclerotic heart disease of native coronary artery without angina pectoris: Secondary | ICD-10-CM | POA: Diagnosis not present

## 2023-01-30 DIAGNOSIS — I110 Hypertensive heart disease with heart failure: Secondary | ICD-10-CM | POA: Diagnosis not present

## 2023-02-12 ENCOUNTER — Ambulatory Visit (INDEPENDENT_AMBULATORY_CARE_PROVIDER_SITE_OTHER): Payer: Medicare Other | Admitting: Podiatry

## 2023-02-12 DIAGNOSIS — B351 Tinea unguium: Secondary | ICD-10-CM | POA: Diagnosis not present

## 2023-02-12 DIAGNOSIS — M79674 Pain in right toe(s): Secondary | ICD-10-CM

## 2023-02-12 DIAGNOSIS — L84 Corns and callosities: Secondary | ICD-10-CM

## 2023-02-12 DIAGNOSIS — E119 Type 2 diabetes mellitus without complications: Secondary | ICD-10-CM

## 2023-02-12 DIAGNOSIS — M79675 Pain in left toe(s): Secondary | ICD-10-CM | POA: Diagnosis not present

## 2023-02-12 NOTE — Progress Notes (Signed)
  Subjective:  Patient ID: Melissa Bishop, female    DOB: 1945/11/22,  MRN: [id number]  Melissa Bishop presents to clinic today for: preventative diabetic foot care and callus(es) both feet and painful thick toenails that are difficult to trim. Painful toenails interfere with ambulation. Aggravating factors include wearing enclosed shoe gear. Pain is relieved with periodic professional debridement. Painful calluses are aggravated when weightbearing with and without shoegear. Pain is relieved with periodic professional debridement.  Chief Complaint  Patient presents with   Diabetes    RFC    PCP is Gweneth Dimitri, MD.  No Known Allergies  Review of Systems: Negative except as noted in the HPI.  Objective: No changes noted in today's physical examination. There were no vitals filed for this visit.  Melissa Bishop is a pleasant 77 y.o. female in NAD. AAO x 3.  Vascular Examination: Capillary refill time I<3 seconds b/l. Vascular status intact b/l with palpable pedal pulses. Pedal hair sparse b/l. No edema. No pain with calf compression b/l. Skin temperature gradient WNL b/l.   Neurological Examination: Sensation grossly intact b/l with 10 gram monofilament. Vibratory sensation intact b/l.   Dermatological Examination: Pedal skin with normal turgor, texture and tone b/l.  No open wounds. No interdigital macerations.   Toenails 1-5 b/l thick, discolored, elongated with subungual debris and pain on dorsal palpation.   Hyperkeratotic lesion(s) bilateral great toes.  No erythema, no edema, no drainage, no fluctuance.  Musculoskeletal Examination: Muscle strength 5/5 to all lower extremity muscle groups bilaterally. Hammertoe deformity noted 2-5 b/l.  Radiographs: None     Latest Ref Rng & Units 03/23/2022    2:55 PM  Hemoglobin A1C  Hemoglobin-A1c 4.8 - 5.6 % 6.1    Assessment/Plan: 1. Pain due to onychomycosis of toenails of both feet   2. Callus   3. Controlled type 2 diabetes  mellitus without complication, without long-term current use of insulin (HCC)     -Guardian present with patient today. All questions/concerns addressed on today's visit. -Continue foot and shoe inspections daily. Monitor blood glucose per PCP/Endocrinologist's recommendations. -Toenails 1-5 bilaterally were debrided in length and girth with sterile nail nippers and dremel. Pinpoint bleeding of L hallux addressed with Lumicain Hemostatic Solution, cleansed with alcohol. Triple antibiotic ointment applied. No further treatment required by patient/caregiver. -Callus(es) bilateral great toes pared utilizing sterile scalpel blade without complication or incident. Total number debrided =2. -Patient/POA to call should there be question/concern in the interim.   Return in about 3 months (around 05/15/2023).  Melissa Bishop, DPM

## 2023-02-18 ENCOUNTER — Encounter: Payer: Self-pay | Admitting: Podiatry

## 2023-03-05 DIAGNOSIS — L400 Psoriasis vulgaris: Secondary | ICD-10-CM | POA: Diagnosis not present

## 2023-03-07 ENCOUNTER — Ambulatory Visit (INDEPENDENT_AMBULATORY_CARE_PROVIDER_SITE_OTHER): Payer: Medicare Other

## 2023-03-07 DIAGNOSIS — I428 Other cardiomyopathies: Secondary | ICD-10-CM

## 2023-03-07 LAB — CUP PACEART REMOTE DEVICE CHECK
Battery Remaining Longevity: 72 mo
Battery Remaining Percentage: 69 %
Battery Voltage: 2.99 V
Date Time Interrogation Session: 20240725020334
HighPow Impedance: 91 Ohm
Implantable Lead Connection Status: 753985
Implantable Lead Connection Status: 753985
Implantable Lead Implant Date: 20211025
Implantable Lead Implant Date: 20211025
Implantable Lead Location: 753858
Implantable Lead Location: 753860
Implantable Pulse Generator Implant Date: 20211025
Lead Channel Impedance Value: 1525 Ohm
Lead Channel Impedance Value: 460 Ohm
Lead Channel Pacing Threshold Amplitude: 0.75 V
Lead Channel Pacing Threshold Amplitude: 1 V
Lead Channel Pacing Threshold Pulse Width: 0.5 ms
Lead Channel Pacing Threshold Pulse Width: 0.5 ms
Lead Channel Sensing Intrinsic Amplitude: 12 mV
Lead Channel Setting Pacing Amplitude: 1.5 V
Lead Channel Setting Pacing Amplitude: 1.75 V
Lead Channel Setting Pacing Pulse Width: 0.5 ms
Lead Channel Setting Pacing Pulse Width: 0.5 ms
Lead Channel Setting Sensing Sensitivity: 0.5 mV
Pulse Gen Serial Number: 810006964

## 2023-03-12 DIAGNOSIS — I10 Essential (primary) hypertension: Secondary | ICD-10-CM | POA: Diagnosis not present

## 2023-03-12 DIAGNOSIS — Z79899 Other long term (current) drug therapy: Secondary | ICD-10-CM | POA: Diagnosis not present

## 2023-03-12 DIAGNOSIS — E559 Vitamin D deficiency, unspecified: Secondary | ICD-10-CM | POA: Diagnosis not present

## 2023-03-12 DIAGNOSIS — E1159 Type 2 diabetes mellitus with other circulatory complications: Secondary | ICD-10-CM | POA: Diagnosis not present

## 2023-03-12 DIAGNOSIS — I5022 Chronic systolic (congestive) heart failure: Secondary | ICD-10-CM | POA: Diagnosis not present

## 2023-03-12 DIAGNOSIS — R4189 Other symptoms and signs involving cognitive functions and awareness: Secondary | ICD-10-CM | POA: Diagnosis not present

## 2023-03-12 DIAGNOSIS — M810 Age-related osteoporosis without current pathological fracture: Secondary | ICD-10-CM | POA: Diagnosis not present

## 2023-03-12 DIAGNOSIS — G250 Essential tremor: Secondary | ICD-10-CM | POA: Diagnosis not present

## 2023-03-12 DIAGNOSIS — Z6833 Body mass index (BMI) 33.0-33.9, adult: Secondary | ICD-10-CM | POA: Diagnosis not present

## 2023-03-12 DIAGNOSIS — E785 Hyperlipidemia, unspecified: Secondary | ICD-10-CM | POA: Diagnosis not present

## 2023-03-12 DIAGNOSIS — G629 Polyneuropathy, unspecified: Secondary | ICD-10-CM | POA: Diagnosis not present

## 2023-03-12 DIAGNOSIS — E1142 Type 2 diabetes mellitus with diabetic polyneuropathy: Secondary | ICD-10-CM | POA: Diagnosis not present

## 2023-03-18 NOTE — Progress Notes (Signed)
Remote ICD transmission.   

## 2023-03-26 ENCOUNTER — Other Ambulatory Visit: Payer: Self-pay | Admitting: Cardiovascular Disease

## 2023-03-29 ENCOUNTER — Other Ambulatory Visit: Payer: Self-pay | Admitting: Family Medicine

## 2023-03-29 DIAGNOSIS — Z1231 Encounter for screening mammogram for malignant neoplasm of breast: Secondary | ICD-10-CM

## 2023-04-26 DIAGNOSIS — Z83518 Family history of other specified eye disorder: Secondary | ICD-10-CM | POA: Diagnosis not present

## 2023-04-26 DIAGNOSIS — Z961 Presence of intraocular lens: Secondary | ICD-10-CM | POA: Diagnosis not present

## 2023-04-26 DIAGNOSIS — E119 Type 2 diabetes mellitus without complications: Secondary | ICD-10-CM | POA: Diagnosis not present

## 2023-04-26 DIAGNOSIS — H35372 Puckering of macula, left eye: Secondary | ICD-10-CM | POA: Diagnosis not present

## 2023-05-01 ENCOUNTER — Other Ambulatory Visit: Payer: Self-pay | Admitting: Family Medicine

## 2023-05-01 DIAGNOSIS — E041 Nontoxic single thyroid nodule: Secondary | ICD-10-CM

## 2023-05-09 ENCOUNTER — Ambulatory Visit: Payer: Medicare Other

## 2023-05-13 ENCOUNTER — Other Ambulatory Visit: Payer: Medicare Other

## 2023-05-15 ENCOUNTER — Ambulatory Visit
Admission: RE | Admit: 2023-05-15 | Discharge: 2023-05-15 | Disposition: A | Discharge status: Home / Self Care | Payer: Medicare Other | Source: Ambulatory Visit | Attending: Family Medicine | Admitting: Family Medicine

## 2023-05-15 DIAGNOSIS — E041 Nontoxic single thyroid nodule: Secondary | ICD-10-CM

## 2023-05-22 ENCOUNTER — Ambulatory Visit (INDEPENDENT_AMBULATORY_CARE_PROVIDER_SITE_OTHER): Payer: Medicare Other | Admitting: Podiatry

## 2023-05-22 DIAGNOSIS — B351 Tinea unguium: Secondary | ICD-10-CM | POA: Diagnosis not present

## 2023-05-22 DIAGNOSIS — E119 Type 2 diabetes mellitus without complications: Secondary | ICD-10-CM

## 2023-05-22 DIAGNOSIS — L84 Corns and callosities: Secondary | ICD-10-CM | POA: Diagnosis not present

## 2023-05-22 DIAGNOSIS — M79674 Pain in right toe(s): Secondary | ICD-10-CM | POA: Diagnosis not present

## 2023-05-22 DIAGNOSIS — M79675 Pain in left toe(s): Secondary | ICD-10-CM

## 2023-05-22 NOTE — Patient Instructions (Signed)
Mr. Sanjose:  Melissa Bishop is starting to get a corn and hammertoe on her right 5th toe. I recommend she purchase Skechers shoes with stretchable uppers (men's size for her as they will be a little more accommodating for her feet).   She is also eligible for diabetic shoes which we can provide for her. Medicare pays 80% and she would be responsible for 20% cost of her diabetic shoes/inserts. Let us know if you would like to proceed with diabetic shoes for her. In the meantime, she can wear the Skechers---just make sure the uppers are stretchy material which won't rub her hammertoe.

## 2023-05-26 ENCOUNTER — Encounter: Payer: Self-pay | Admitting: Podiatry

## 2023-05-26 NOTE — Progress Notes (Signed)
Subjective:  Patient ID: Melissa Bishop, female    DOB: 1946/02/03,  MRN: [id number]  Melissa Bishop presents to clinic today for preventative diabetic foot care and callus(es) of both feet and painful thick toenails that are difficult to trim. Painful toenails interfere with ambulation. Aggravating factors include wearing enclosed shoe gear. Pain is relieved with periodic professional debridement. Painful calluses are aggravated when weightbearing with and without shoegear. Pain is relieved with periodic professional debridement.  "I have a corn." Melissa Bishop relates development of new lesion right 5th toe corn. Denies any redness, drainage or swelling.   Melissa Bishop and her mom, Melissa Bishop, will be moving to [facility]. She is excited about the move. Caregiver Darl Pikes, is present during today's visit. Chief Complaint  Patient presents with   RFC    RFC   New problem(s): None.   PCP is Gweneth Dimitri, MD.  No Known Allergies  Review of Systems: Negative except as noted in the HPI.  Objective: There were no vitals filed for this visit. Melissa Bishop is a pleasant 77 y.o. female obese in NAD. AAO x 3.  Vascular Examination: Capillary refill time <3 seconds b/l. Vascular status intact b/l with palpable pedal pulses. Pedal hair sparse b/l. No edema. No pain with calf compression b/l. Skin temperature gradient WNL b/l.   Neurological Examination: Sensation grossly intact b/l with 10 gram monofilament. Vibratory sensation intact b/l.   Dermatological Examination: Pedal skin with normal turgor, texture and tone b/l.  No open wounds. No interdigital macerations.   Toenails 1-5 b/l thick, discolored, elongated with subungual debris and pain on dorsal palpation.   Hyperkeratotic lesion(s) dorsal PIPJ right 5th toe No erythema, no edema, no drainage, no fluctuance.  Musculoskeletal Examination: Muscle strength 5/5 to all lower extremity muscle groups bilaterally. Hammertoe deformity  noted 2-5 b/l.  Radiographs: None  Assessment/Plan: 1. Pain due to onychomycosis of toenails of both feet   2. Corns   3. Controlled type 2 diabetes mellitus without complication, without long-term current use of insulin (HCC)     -Consent given for treatment as described below: -Examined patient. -Patient to continue soft, supportive shoe gear daily. -Mycotic toenails 1-5 bilaterally were debrided in length and girth with sterile nail nippers and dremel without incident. -Hyperkeratotic lesion(s) R 5th toe debrided without incident. Total number debrided=1. Sent note to patient's brother (POA) recommending Skecher shoes with stretchable uppers and also informing him of diabetic shoe benefit available to [name]. -Patient/POA to call should there be question/concern in the interim.   Return in about 3 months (around 08/22/2023).  Freddie Breech, DPM

## 2023-06-04 ENCOUNTER — Ambulatory Visit: Payer: Medicare Other

## 2023-06-05 DIAGNOSIS — L400 Psoriasis vulgaris: Secondary | ICD-10-CM | POA: Diagnosis not present

## 2023-06-05 DIAGNOSIS — L304 Erythema intertrigo: Secondary | ICD-10-CM | POA: Diagnosis not present

## 2023-06-06 ENCOUNTER — Ambulatory Visit (INDEPENDENT_AMBULATORY_CARE_PROVIDER_SITE_OTHER): Payer: Medicare Other

## 2023-06-06 DIAGNOSIS — I428 Other cardiomyopathies: Secondary | ICD-10-CM | POA: Diagnosis not present

## 2023-06-07 LAB — CUP PACEART REMOTE DEVICE CHECK
Battery Remaining Longevity: 67 mo
Battery Remaining Percentage: 66 %
Battery Voltage: 2.98 V
Date Time Interrogation Session: 20241024132703
HighPow Impedance: 92 Ohm
Implantable Lead Connection Status: 753985
Implantable Lead Connection Status: 753985
Implantable Lead Implant Date: 20211025
Implantable Lead Implant Date: 20211025
Implantable Lead Location: 753858
Implantable Lead Location: 753860
Implantable Pulse Generator Implant Date: 20211025
Lead Channel Impedance Value: 1500 Ohm
Lead Channel Impedance Value: 460 Ohm
Lead Channel Pacing Threshold Amplitude: 0.625 V
Lead Channel Pacing Threshold Amplitude: 0.875 V
Lead Channel Pacing Threshold Pulse Width: 0.5 ms
Lead Channel Pacing Threshold Pulse Width: 0.5 ms
Lead Channel Sensing Intrinsic Amplitude: 12 mV
Lead Channel Setting Pacing Amplitude: 1.375
Lead Channel Setting Pacing Amplitude: 1.625
Lead Channel Setting Pacing Pulse Width: 0.5 ms
Lead Channel Setting Pacing Pulse Width: 0.5 ms
Lead Channel Setting Sensing Sensitivity: 0.5 mV
Pulse Gen Serial Number: 810006964

## 2023-06-25 ENCOUNTER — Ambulatory Visit
Admission: RE | Admit: 2023-06-25 | Discharge: 2023-06-25 | Disposition: A | Discharge status: Home / Self Care | Payer: Medicare Other | Source: Ambulatory Visit | Attending: Family Medicine | Admitting: Family Medicine

## 2023-06-25 DIAGNOSIS — Z1231 Encounter for screening mammogram for malignant neoplasm of breast: Secondary | ICD-10-CM | POA: Diagnosis not present

## 2023-06-25 NOTE — Progress Notes (Signed)
Remote ICD transmission.   

## 2023-07-01 DIAGNOSIS — R488 Other symbolic dysfunctions: Secondary | ICD-10-CM | POA: Diagnosis not present

## 2023-07-01 DIAGNOSIS — R1310 Dysphagia, unspecified: Secondary | ICD-10-CM | POA: Diagnosis not present

## 2023-07-01 DIAGNOSIS — R41841 Cognitive communication deficit: Secondary | ICD-10-CM | POA: Diagnosis not present

## 2023-07-01 DIAGNOSIS — R2689 Other abnormalities of gait and mobility: Secondary | ICD-10-CM | POA: Diagnosis not present

## 2023-07-01 DIAGNOSIS — R4789 Other speech disturbances: Secondary | ICD-10-CM | POA: Diagnosis not present

## 2023-07-01 DIAGNOSIS — R2681 Unsteadiness on feet: Secondary | ICD-10-CM | POA: Diagnosis not present

## 2023-07-01 DIAGNOSIS — M6259 Muscle wasting and atrophy, not elsewhere classified, multiple sites: Secondary | ICD-10-CM | POA: Diagnosis not present

## 2023-07-01 DIAGNOSIS — R4185 Anosognosia: Secondary | ICD-10-CM | POA: Diagnosis not present

## 2023-07-03 DIAGNOSIS — R2689 Other abnormalities of gait and mobility: Secondary | ICD-10-CM | POA: Diagnosis not present

## 2023-07-03 DIAGNOSIS — R2681 Unsteadiness on feet: Secondary | ICD-10-CM | POA: Diagnosis not present

## 2023-07-03 DIAGNOSIS — M6259 Muscle wasting and atrophy, not elsewhere classified, multiple sites: Secondary | ICD-10-CM | POA: Diagnosis not present

## 2023-07-03 DIAGNOSIS — R4185 Anosognosia: Secondary | ICD-10-CM | POA: Diagnosis not present

## 2023-07-03 DIAGNOSIS — R41841 Cognitive communication deficit: Secondary | ICD-10-CM | POA: Diagnosis not present

## 2023-07-03 DIAGNOSIS — R488 Other symbolic dysfunctions: Secondary | ICD-10-CM | POA: Diagnosis not present

## 2023-07-04 DIAGNOSIS — R41841 Cognitive communication deficit: Secondary | ICD-10-CM | POA: Diagnosis not present

## 2023-07-04 DIAGNOSIS — R2689 Other abnormalities of gait and mobility: Secondary | ICD-10-CM | POA: Diagnosis not present

## 2023-07-04 DIAGNOSIS — M6259 Muscle wasting and atrophy, not elsewhere classified, multiple sites: Secondary | ICD-10-CM | POA: Diagnosis not present

## 2023-07-04 DIAGNOSIS — R488 Other symbolic dysfunctions: Secondary | ICD-10-CM | POA: Diagnosis not present

## 2023-07-04 DIAGNOSIS — R4185 Anosognosia: Secondary | ICD-10-CM | POA: Diagnosis not present

## 2023-07-04 DIAGNOSIS — R2681 Unsteadiness on feet: Secondary | ICD-10-CM | POA: Diagnosis not present

## 2023-07-05 DIAGNOSIS — R2681 Unsteadiness on feet: Secondary | ICD-10-CM | POA: Diagnosis not present

## 2023-07-05 DIAGNOSIS — M6259 Muscle wasting and atrophy, not elsewhere classified, multiple sites: Secondary | ICD-10-CM | POA: Diagnosis not present

## 2023-07-05 DIAGNOSIS — R2689 Other abnormalities of gait and mobility: Secondary | ICD-10-CM | POA: Diagnosis not present

## 2023-07-05 DIAGNOSIS — R4185 Anosognosia: Secondary | ICD-10-CM | POA: Diagnosis not present

## 2023-07-05 DIAGNOSIS — R488 Other symbolic dysfunctions: Secondary | ICD-10-CM | POA: Diagnosis not present

## 2023-07-05 DIAGNOSIS — R41841 Cognitive communication deficit: Secondary | ICD-10-CM | POA: Diagnosis not present

## 2023-07-07 DIAGNOSIS — M6259 Muscle wasting and atrophy, not elsewhere classified, multiple sites: Secondary | ICD-10-CM | POA: Diagnosis not present

## 2023-07-07 DIAGNOSIS — R2689 Other abnormalities of gait and mobility: Secondary | ICD-10-CM | POA: Diagnosis not present

## 2023-07-07 DIAGNOSIS — R4185 Anosognosia: Secondary | ICD-10-CM | POA: Diagnosis not present

## 2023-07-07 DIAGNOSIS — R488 Other symbolic dysfunctions: Secondary | ICD-10-CM | POA: Diagnosis not present

## 2023-07-07 DIAGNOSIS — R2681 Unsteadiness on feet: Secondary | ICD-10-CM | POA: Diagnosis not present

## 2023-07-07 DIAGNOSIS — R41841 Cognitive communication deficit: Secondary | ICD-10-CM | POA: Diagnosis not present

## 2023-07-08 DIAGNOSIS — M6259 Muscle wasting and atrophy, not elsewhere classified, multiple sites: Secondary | ICD-10-CM | POA: Diagnosis not present

## 2023-07-08 DIAGNOSIS — R2681 Unsteadiness on feet: Secondary | ICD-10-CM | POA: Diagnosis not present

## 2023-07-08 DIAGNOSIS — R2689 Other abnormalities of gait and mobility: Secondary | ICD-10-CM | POA: Diagnosis not present

## 2023-07-08 DIAGNOSIS — R41841 Cognitive communication deficit: Secondary | ICD-10-CM | POA: Diagnosis not present

## 2023-07-08 DIAGNOSIS — R4185 Anosognosia: Secondary | ICD-10-CM | POA: Diagnosis not present

## 2023-07-08 DIAGNOSIS — R488 Other symbolic dysfunctions: Secondary | ICD-10-CM | POA: Diagnosis not present

## 2023-07-09 DIAGNOSIS — R488 Other symbolic dysfunctions: Secondary | ICD-10-CM | POA: Diagnosis not present

## 2023-07-09 DIAGNOSIS — R4185 Anosognosia: Secondary | ICD-10-CM | POA: Diagnosis not present

## 2023-07-09 DIAGNOSIS — R2681 Unsteadiness on feet: Secondary | ICD-10-CM | POA: Diagnosis not present

## 2023-07-09 DIAGNOSIS — R41841 Cognitive communication deficit: Secondary | ICD-10-CM | POA: Diagnosis not present

## 2023-07-09 DIAGNOSIS — R2689 Other abnormalities of gait and mobility: Secondary | ICD-10-CM | POA: Diagnosis not present

## 2023-07-09 DIAGNOSIS — M6259 Muscle wasting and atrophy, not elsewhere classified, multiple sites: Secondary | ICD-10-CM | POA: Diagnosis not present

## 2023-07-10 DIAGNOSIS — M6259 Muscle wasting and atrophy, not elsewhere classified, multiple sites: Secondary | ICD-10-CM | POA: Diagnosis not present

## 2023-07-10 DIAGNOSIS — R41841 Cognitive communication deficit: Secondary | ICD-10-CM | POA: Diagnosis not present

## 2023-07-10 DIAGNOSIS — R4185 Anosognosia: Secondary | ICD-10-CM | POA: Diagnosis not present

## 2023-07-10 DIAGNOSIS — R2681 Unsteadiness on feet: Secondary | ICD-10-CM | POA: Diagnosis not present

## 2023-07-10 DIAGNOSIS — R2689 Other abnormalities of gait and mobility: Secondary | ICD-10-CM | POA: Diagnosis not present

## 2023-07-10 DIAGNOSIS — R488 Other symbolic dysfunctions: Secondary | ICD-10-CM | POA: Diagnosis not present

## 2023-07-15 DIAGNOSIS — R1310 Dysphagia, unspecified: Secondary | ICD-10-CM | POA: Diagnosis not present

## 2023-07-15 DIAGNOSIS — M6259 Muscle wasting and atrophy, not elsewhere classified, multiple sites: Secondary | ICD-10-CM | POA: Diagnosis not present

## 2023-07-15 DIAGNOSIS — R2681 Unsteadiness on feet: Secondary | ICD-10-CM | POA: Diagnosis not present

## 2023-07-15 DIAGNOSIS — R488 Other symbolic dysfunctions: Secondary | ICD-10-CM | POA: Diagnosis not present

## 2023-07-15 DIAGNOSIS — R4789 Other speech disturbances: Secondary | ICD-10-CM | POA: Diagnosis not present

## 2023-07-15 DIAGNOSIS — R2689 Other abnormalities of gait and mobility: Secondary | ICD-10-CM | POA: Diagnosis not present

## 2023-07-15 DIAGNOSIS — R41841 Cognitive communication deficit: Secondary | ICD-10-CM | POA: Diagnosis not present

## 2023-07-15 DIAGNOSIS — R4185 Anosognosia: Secondary | ICD-10-CM | POA: Diagnosis not present

## 2023-07-16 DIAGNOSIS — M6259 Muscle wasting and atrophy, not elsewhere classified, multiple sites: Secondary | ICD-10-CM | POA: Diagnosis not present

## 2023-07-16 DIAGNOSIS — R488 Other symbolic dysfunctions: Secondary | ICD-10-CM | POA: Diagnosis not present

## 2023-07-16 DIAGNOSIS — R41841 Cognitive communication deficit: Secondary | ICD-10-CM | POA: Diagnosis not present

## 2023-07-16 DIAGNOSIS — R2689 Other abnormalities of gait and mobility: Secondary | ICD-10-CM | POA: Diagnosis not present

## 2023-07-16 DIAGNOSIS — R4185 Anosognosia: Secondary | ICD-10-CM | POA: Diagnosis not present

## 2023-07-16 DIAGNOSIS — R2681 Unsteadiness on feet: Secondary | ICD-10-CM | POA: Diagnosis not present

## 2023-07-17 DIAGNOSIS — M6259 Muscle wasting and atrophy, not elsewhere classified, multiple sites: Secondary | ICD-10-CM | POA: Diagnosis not present

## 2023-07-17 DIAGNOSIS — R4185 Anosognosia: Secondary | ICD-10-CM | POA: Diagnosis not present

## 2023-07-17 DIAGNOSIS — R2689 Other abnormalities of gait and mobility: Secondary | ICD-10-CM | POA: Diagnosis not present

## 2023-07-17 DIAGNOSIS — R488 Other symbolic dysfunctions: Secondary | ICD-10-CM | POA: Diagnosis not present

## 2023-07-17 DIAGNOSIS — R41841 Cognitive communication deficit: Secondary | ICD-10-CM | POA: Diagnosis not present

## 2023-07-17 DIAGNOSIS — R2681 Unsteadiness on feet: Secondary | ICD-10-CM | POA: Diagnosis not present

## 2023-07-18 DIAGNOSIS — R41841 Cognitive communication deficit: Secondary | ICD-10-CM | POA: Diagnosis not present

## 2023-07-18 DIAGNOSIS — R488 Other symbolic dysfunctions: Secondary | ICD-10-CM | POA: Diagnosis not present

## 2023-07-18 DIAGNOSIS — M6259 Muscle wasting and atrophy, not elsewhere classified, multiple sites: Secondary | ICD-10-CM | POA: Diagnosis not present

## 2023-07-18 DIAGNOSIS — R2689 Other abnormalities of gait and mobility: Secondary | ICD-10-CM | POA: Diagnosis not present

## 2023-07-18 DIAGNOSIS — R4185 Anosognosia: Secondary | ICD-10-CM | POA: Diagnosis not present

## 2023-07-18 DIAGNOSIS — R2681 Unsteadiness on feet: Secondary | ICD-10-CM | POA: Diagnosis not present

## 2023-07-22 DIAGNOSIS — R2689 Other abnormalities of gait and mobility: Secondary | ICD-10-CM | POA: Diagnosis not present

## 2023-07-22 DIAGNOSIS — R4185 Anosognosia: Secondary | ICD-10-CM | POA: Diagnosis not present

## 2023-07-22 DIAGNOSIS — R488 Other symbolic dysfunctions: Secondary | ICD-10-CM | POA: Diagnosis not present

## 2023-07-22 DIAGNOSIS — R41841 Cognitive communication deficit: Secondary | ICD-10-CM | POA: Diagnosis not present

## 2023-07-22 DIAGNOSIS — R2681 Unsteadiness on feet: Secondary | ICD-10-CM | POA: Diagnosis not present

## 2023-07-22 DIAGNOSIS — M6259 Muscle wasting and atrophy, not elsewhere classified, multiple sites: Secondary | ICD-10-CM | POA: Diagnosis not present

## 2023-07-23 DIAGNOSIS — M6259 Muscle wasting and atrophy, not elsewhere classified, multiple sites: Secondary | ICD-10-CM | POA: Diagnosis not present

## 2023-07-23 DIAGNOSIS — R4185 Anosognosia: Secondary | ICD-10-CM | POA: Diagnosis not present

## 2023-07-23 DIAGNOSIS — R488 Other symbolic dysfunctions: Secondary | ICD-10-CM | POA: Diagnosis not present

## 2023-07-23 DIAGNOSIS — R2681 Unsteadiness on feet: Secondary | ICD-10-CM | POA: Diagnosis not present

## 2023-07-23 DIAGNOSIS — R2689 Other abnormalities of gait and mobility: Secondary | ICD-10-CM | POA: Diagnosis not present

## 2023-07-23 DIAGNOSIS — R41841 Cognitive communication deficit: Secondary | ICD-10-CM | POA: Diagnosis not present

## 2023-07-24 DIAGNOSIS — M6259 Muscle wasting and atrophy, not elsewhere classified, multiple sites: Secondary | ICD-10-CM | POA: Diagnosis not present

## 2023-07-24 DIAGNOSIS — R41841 Cognitive communication deficit: Secondary | ICD-10-CM | POA: Diagnosis not present

## 2023-07-24 DIAGNOSIS — R2681 Unsteadiness on feet: Secondary | ICD-10-CM | POA: Diagnosis not present

## 2023-07-24 DIAGNOSIS — R488 Other symbolic dysfunctions: Secondary | ICD-10-CM | POA: Diagnosis not present

## 2023-07-24 DIAGNOSIS — R4185 Anosognosia: Secondary | ICD-10-CM | POA: Diagnosis not present

## 2023-07-24 DIAGNOSIS — R2689 Other abnormalities of gait and mobility: Secondary | ICD-10-CM | POA: Diagnosis not present

## 2023-07-25 DIAGNOSIS — R4185 Anosognosia: Secondary | ICD-10-CM | POA: Diagnosis not present

## 2023-07-25 DIAGNOSIS — R488 Other symbolic dysfunctions: Secondary | ICD-10-CM | POA: Diagnosis not present

## 2023-07-25 DIAGNOSIS — R2689 Other abnormalities of gait and mobility: Secondary | ICD-10-CM | POA: Diagnosis not present

## 2023-07-25 DIAGNOSIS — R2681 Unsteadiness on feet: Secondary | ICD-10-CM | POA: Diagnosis not present

## 2023-07-25 DIAGNOSIS — M6259 Muscle wasting and atrophy, not elsewhere classified, multiple sites: Secondary | ICD-10-CM | POA: Diagnosis not present

## 2023-07-25 DIAGNOSIS — R41841 Cognitive communication deficit: Secondary | ICD-10-CM | POA: Diagnosis not present

## 2023-07-26 DIAGNOSIS — R2681 Unsteadiness on feet: Secondary | ICD-10-CM | POA: Diagnosis not present

## 2023-07-26 DIAGNOSIS — M6259 Muscle wasting and atrophy, not elsewhere classified, multiple sites: Secondary | ICD-10-CM | POA: Diagnosis not present

## 2023-07-26 DIAGNOSIS — R2689 Other abnormalities of gait and mobility: Secondary | ICD-10-CM | POA: Diagnosis not present

## 2023-07-26 DIAGNOSIS — R41841 Cognitive communication deficit: Secondary | ICD-10-CM | POA: Diagnosis not present

## 2023-07-26 DIAGNOSIS — R4185 Anosognosia: Secondary | ICD-10-CM | POA: Diagnosis not present

## 2023-07-26 DIAGNOSIS — R488 Other symbolic dysfunctions: Secondary | ICD-10-CM | POA: Diagnosis not present

## 2023-07-29 DIAGNOSIS — R4185 Anosognosia: Secondary | ICD-10-CM | POA: Diagnosis not present

## 2023-07-29 DIAGNOSIS — R2681 Unsteadiness on feet: Secondary | ICD-10-CM | POA: Diagnosis not present

## 2023-07-29 DIAGNOSIS — R488 Other symbolic dysfunctions: Secondary | ICD-10-CM | POA: Diagnosis not present

## 2023-07-29 DIAGNOSIS — M6259 Muscle wasting and atrophy, not elsewhere classified, multiple sites: Secondary | ICD-10-CM | POA: Diagnosis not present

## 2023-07-29 DIAGNOSIS — R2689 Other abnormalities of gait and mobility: Secondary | ICD-10-CM | POA: Diagnosis not present

## 2023-07-29 DIAGNOSIS — R41841 Cognitive communication deficit: Secondary | ICD-10-CM | POA: Diagnosis not present

## 2023-07-30 DIAGNOSIS — R41841 Cognitive communication deficit: Secondary | ICD-10-CM | POA: Diagnosis not present

## 2023-07-30 DIAGNOSIS — R4185 Anosognosia: Secondary | ICD-10-CM | POA: Diagnosis not present

## 2023-07-30 DIAGNOSIS — R2681 Unsteadiness on feet: Secondary | ICD-10-CM | POA: Diagnosis not present

## 2023-07-30 DIAGNOSIS — R488 Other symbolic dysfunctions: Secondary | ICD-10-CM | POA: Diagnosis not present

## 2023-07-30 DIAGNOSIS — R2689 Other abnormalities of gait and mobility: Secondary | ICD-10-CM | POA: Diagnosis not present

## 2023-07-30 DIAGNOSIS — M6259 Muscle wasting and atrophy, not elsewhere classified, multiple sites: Secondary | ICD-10-CM | POA: Diagnosis not present

## 2023-07-31 DIAGNOSIS — R2689 Other abnormalities of gait and mobility: Secondary | ICD-10-CM | POA: Diagnosis not present

## 2023-07-31 DIAGNOSIS — R41841 Cognitive communication deficit: Secondary | ICD-10-CM | POA: Diagnosis not present

## 2023-07-31 DIAGNOSIS — M6259 Muscle wasting and atrophy, not elsewhere classified, multiple sites: Secondary | ICD-10-CM | POA: Diagnosis not present

## 2023-07-31 DIAGNOSIS — R4185 Anosognosia: Secondary | ICD-10-CM | POA: Diagnosis not present

## 2023-07-31 DIAGNOSIS — R2681 Unsteadiness on feet: Secondary | ICD-10-CM | POA: Diagnosis not present

## 2023-07-31 DIAGNOSIS — R488 Other symbolic dysfunctions: Secondary | ICD-10-CM | POA: Diagnosis not present

## 2023-08-01 DIAGNOSIS — R488 Other symbolic dysfunctions: Secondary | ICD-10-CM | POA: Diagnosis not present

## 2023-08-01 DIAGNOSIS — I4821 Permanent atrial fibrillation: Secondary | ICD-10-CM | POA: Diagnosis not present

## 2023-08-01 DIAGNOSIS — E785 Hyperlipidemia, unspecified: Secondary | ICD-10-CM | POA: Diagnosis not present

## 2023-08-01 DIAGNOSIS — Z23 Encounter for immunization: Secondary | ICD-10-CM | POA: Diagnosis not present

## 2023-08-01 DIAGNOSIS — R2681 Unsteadiness on feet: Secondary | ICD-10-CM | POA: Diagnosis not present

## 2023-08-01 DIAGNOSIS — I10 Essential (primary) hypertension: Secondary | ICD-10-CM | POA: Diagnosis not present

## 2023-08-01 DIAGNOSIS — R2689 Other abnormalities of gait and mobility: Secondary | ICD-10-CM | POA: Diagnosis not present

## 2023-08-01 DIAGNOSIS — I5022 Chronic systolic (congestive) heart failure: Secondary | ICD-10-CM | POA: Diagnosis not present

## 2023-08-01 DIAGNOSIS — M810 Age-related osteoporosis without current pathological fracture: Secondary | ICD-10-CM | POA: Diagnosis not present

## 2023-08-01 DIAGNOSIS — R41841 Cognitive communication deficit: Secondary | ICD-10-CM | POA: Diagnosis not present

## 2023-08-01 DIAGNOSIS — R4185 Anosognosia: Secondary | ICD-10-CM | POA: Diagnosis not present

## 2023-08-01 DIAGNOSIS — M6259 Muscle wasting and atrophy, not elsewhere classified, multiple sites: Secondary | ICD-10-CM | POA: Diagnosis not present

## 2023-08-01 DIAGNOSIS — Z0000 Encounter for general adult medical examination without abnormal findings: Secondary | ICD-10-CM | POA: Diagnosis not present

## 2023-08-01 DIAGNOSIS — E1169 Type 2 diabetes mellitus with other specified complication: Secondary | ICD-10-CM | POA: Diagnosis not present

## 2023-08-02 DIAGNOSIS — R2681 Unsteadiness on feet: Secondary | ICD-10-CM | POA: Diagnosis not present

## 2023-08-02 DIAGNOSIS — R4185 Anosognosia: Secondary | ICD-10-CM | POA: Diagnosis not present

## 2023-08-02 DIAGNOSIS — M6259 Muscle wasting and atrophy, not elsewhere classified, multiple sites: Secondary | ICD-10-CM | POA: Diagnosis not present

## 2023-08-02 DIAGNOSIS — R488 Other symbolic dysfunctions: Secondary | ICD-10-CM | POA: Diagnosis not present

## 2023-08-02 DIAGNOSIS — R2689 Other abnormalities of gait and mobility: Secondary | ICD-10-CM | POA: Diagnosis not present

## 2023-08-02 DIAGNOSIS — R41841 Cognitive communication deficit: Secondary | ICD-10-CM | POA: Diagnosis not present

## 2023-08-05 DIAGNOSIS — R488 Other symbolic dysfunctions: Secondary | ICD-10-CM | POA: Diagnosis not present

## 2023-08-05 DIAGNOSIS — R2689 Other abnormalities of gait and mobility: Secondary | ICD-10-CM | POA: Diagnosis not present

## 2023-08-05 DIAGNOSIS — R4185 Anosognosia: Secondary | ICD-10-CM | POA: Diagnosis not present

## 2023-08-05 DIAGNOSIS — M6259 Muscle wasting and atrophy, not elsewhere classified, multiple sites: Secondary | ICD-10-CM | POA: Diagnosis not present

## 2023-08-05 DIAGNOSIS — R2681 Unsteadiness on feet: Secondary | ICD-10-CM | POA: Diagnosis not present

## 2023-08-05 DIAGNOSIS — R41841 Cognitive communication deficit: Secondary | ICD-10-CM | POA: Diagnosis not present

## 2023-08-06 DIAGNOSIS — R2681 Unsteadiness on feet: Secondary | ICD-10-CM | POA: Diagnosis not present

## 2023-08-06 DIAGNOSIS — R41841 Cognitive communication deficit: Secondary | ICD-10-CM | POA: Diagnosis not present

## 2023-08-06 DIAGNOSIS — M6259 Muscle wasting and atrophy, not elsewhere classified, multiple sites: Secondary | ICD-10-CM | POA: Diagnosis not present

## 2023-08-06 DIAGNOSIS — R2689 Other abnormalities of gait and mobility: Secondary | ICD-10-CM | POA: Diagnosis not present

## 2023-08-06 DIAGNOSIS — R4185 Anosognosia: Secondary | ICD-10-CM | POA: Diagnosis not present

## 2023-08-06 DIAGNOSIS — R488 Other symbolic dysfunctions: Secondary | ICD-10-CM | POA: Diagnosis not present

## 2023-08-08 DIAGNOSIS — R41841 Cognitive communication deficit: Secondary | ICD-10-CM | POA: Diagnosis not present

## 2023-08-08 DIAGNOSIS — R2681 Unsteadiness on feet: Secondary | ICD-10-CM | POA: Diagnosis not present

## 2023-08-08 DIAGNOSIS — R4185 Anosognosia: Secondary | ICD-10-CM | POA: Diagnosis not present

## 2023-08-08 DIAGNOSIS — R2689 Other abnormalities of gait and mobility: Secondary | ICD-10-CM | POA: Diagnosis not present

## 2023-08-08 DIAGNOSIS — R488 Other symbolic dysfunctions: Secondary | ICD-10-CM | POA: Diagnosis not present

## 2023-08-08 DIAGNOSIS — M6259 Muscle wasting and atrophy, not elsewhere classified, multiple sites: Secondary | ICD-10-CM | POA: Diagnosis not present

## 2023-08-09 DIAGNOSIS — M6259 Muscle wasting and atrophy, not elsewhere classified, multiple sites: Secondary | ICD-10-CM | POA: Diagnosis not present

## 2023-08-09 DIAGNOSIS — R2681 Unsteadiness on feet: Secondary | ICD-10-CM | POA: Diagnosis not present

## 2023-08-09 DIAGNOSIS — R2689 Other abnormalities of gait and mobility: Secondary | ICD-10-CM | POA: Diagnosis not present

## 2023-08-09 DIAGNOSIS — R4185 Anosognosia: Secondary | ICD-10-CM | POA: Diagnosis not present

## 2023-08-09 DIAGNOSIS — R41841 Cognitive communication deficit: Secondary | ICD-10-CM | POA: Diagnosis not present

## 2023-08-09 DIAGNOSIS — R488 Other symbolic dysfunctions: Secondary | ICD-10-CM | POA: Diagnosis not present

## 2023-08-11 DIAGNOSIS — R41841 Cognitive communication deficit: Secondary | ICD-10-CM | POA: Diagnosis not present

## 2023-08-11 DIAGNOSIS — R488 Other symbolic dysfunctions: Secondary | ICD-10-CM | POA: Diagnosis not present

## 2023-08-11 DIAGNOSIS — R4185 Anosognosia: Secondary | ICD-10-CM | POA: Diagnosis not present

## 2023-08-11 DIAGNOSIS — R2689 Other abnormalities of gait and mobility: Secondary | ICD-10-CM | POA: Diagnosis not present

## 2023-08-11 DIAGNOSIS — R2681 Unsteadiness on feet: Secondary | ICD-10-CM | POA: Diagnosis not present

## 2023-08-11 DIAGNOSIS — M6259 Muscle wasting and atrophy, not elsewhere classified, multiple sites: Secondary | ICD-10-CM | POA: Diagnosis not present

## 2023-08-12 DIAGNOSIS — R4185 Anosognosia: Secondary | ICD-10-CM | POA: Diagnosis not present

## 2023-08-12 DIAGNOSIS — R2681 Unsteadiness on feet: Secondary | ICD-10-CM | POA: Diagnosis not present

## 2023-08-12 DIAGNOSIS — R488 Other symbolic dysfunctions: Secondary | ICD-10-CM | POA: Diagnosis not present

## 2023-08-12 DIAGNOSIS — M6259 Muscle wasting and atrophy, not elsewhere classified, multiple sites: Secondary | ICD-10-CM | POA: Diagnosis not present

## 2023-08-12 DIAGNOSIS — R2689 Other abnormalities of gait and mobility: Secondary | ICD-10-CM | POA: Diagnosis not present

## 2023-08-12 DIAGNOSIS — R41841 Cognitive communication deficit: Secondary | ICD-10-CM | POA: Diagnosis not present

## 2023-08-13 DIAGNOSIS — R2681 Unsteadiness on feet: Secondary | ICD-10-CM | POA: Diagnosis not present

## 2023-08-13 DIAGNOSIS — M6259 Muscle wasting and atrophy, not elsewhere classified, multiple sites: Secondary | ICD-10-CM | POA: Diagnosis not present

## 2023-08-13 DIAGNOSIS — R41841 Cognitive communication deficit: Secondary | ICD-10-CM | POA: Diagnosis not present

## 2023-08-13 DIAGNOSIS — R2689 Other abnormalities of gait and mobility: Secondary | ICD-10-CM | POA: Diagnosis not present

## 2023-08-13 DIAGNOSIS — R488 Other symbolic dysfunctions: Secondary | ICD-10-CM | POA: Diagnosis not present

## 2023-08-13 DIAGNOSIS — R4185 Anosognosia: Secondary | ICD-10-CM | POA: Diagnosis not present

## 2023-08-15 DIAGNOSIS — R4185 Anosognosia: Secondary | ICD-10-CM | POA: Diagnosis not present

## 2023-08-15 DIAGNOSIS — R488 Other symbolic dysfunctions: Secondary | ICD-10-CM | POA: Diagnosis not present

## 2023-08-15 DIAGNOSIS — M6259 Muscle wasting and atrophy, not elsewhere classified, multiple sites: Secondary | ICD-10-CM | POA: Diagnosis not present

## 2023-08-15 DIAGNOSIS — R4789 Other speech disturbances: Secondary | ICD-10-CM | POA: Diagnosis not present

## 2023-08-15 DIAGNOSIS — R2681 Unsteadiness on feet: Secondary | ICD-10-CM | POA: Diagnosis not present

## 2023-08-15 DIAGNOSIS — R1310 Dysphagia, unspecified: Secondary | ICD-10-CM | POA: Diagnosis not present

## 2023-08-15 DIAGNOSIS — R41841 Cognitive communication deficit: Secondary | ICD-10-CM | POA: Diagnosis not present

## 2023-08-15 DIAGNOSIS — R2689 Other abnormalities of gait and mobility: Secondary | ICD-10-CM | POA: Diagnosis not present

## 2023-08-16 DIAGNOSIS — R4185 Anosognosia: Secondary | ICD-10-CM | POA: Diagnosis not present

## 2023-08-16 DIAGNOSIS — R2681 Unsteadiness on feet: Secondary | ICD-10-CM | POA: Diagnosis not present

## 2023-08-16 DIAGNOSIS — R2689 Other abnormalities of gait and mobility: Secondary | ICD-10-CM | POA: Diagnosis not present

## 2023-08-16 DIAGNOSIS — R488 Other symbolic dysfunctions: Secondary | ICD-10-CM | POA: Diagnosis not present

## 2023-08-16 DIAGNOSIS — R41841 Cognitive communication deficit: Secondary | ICD-10-CM | POA: Diagnosis not present

## 2023-08-16 DIAGNOSIS — M6259 Muscle wasting and atrophy, not elsewhere classified, multiple sites: Secondary | ICD-10-CM | POA: Diagnosis not present

## 2023-08-19 DIAGNOSIS — R2689 Other abnormalities of gait and mobility: Secondary | ICD-10-CM | POA: Diagnosis not present

## 2023-08-19 DIAGNOSIS — R488 Other symbolic dysfunctions: Secondary | ICD-10-CM | POA: Diagnosis not present

## 2023-08-19 DIAGNOSIS — R41841 Cognitive communication deficit: Secondary | ICD-10-CM | POA: Diagnosis not present

## 2023-08-19 DIAGNOSIS — R2681 Unsteadiness on feet: Secondary | ICD-10-CM | POA: Diagnosis not present

## 2023-08-19 DIAGNOSIS — M6259 Muscle wasting and atrophy, not elsewhere classified, multiple sites: Secondary | ICD-10-CM | POA: Diagnosis not present

## 2023-08-19 DIAGNOSIS — R4185 Anosognosia: Secondary | ICD-10-CM | POA: Diagnosis not present

## 2023-08-20 DIAGNOSIS — R488 Other symbolic dysfunctions: Secondary | ICD-10-CM | POA: Diagnosis not present

## 2023-08-20 DIAGNOSIS — R2689 Other abnormalities of gait and mobility: Secondary | ICD-10-CM | POA: Diagnosis not present

## 2023-08-20 DIAGNOSIS — R2681 Unsteadiness on feet: Secondary | ICD-10-CM | POA: Diagnosis not present

## 2023-08-20 DIAGNOSIS — R4185 Anosognosia: Secondary | ICD-10-CM | POA: Diagnosis not present

## 2023-08-20 DIAGNOSIS — R41841 Cognitive communication deficit: Secondary | ICD-10-CM | POA: Diagnosis not present

## 2023-08-20 DIAGNOSIS — M6259 Muscle wasting and atrophy, not elsewhere classified, multiple sites: Secondary | ICD-10-CM | POA: Diagnosis not present

## 2023-08-21 DIAGNOSIS — R2689 Other abnormalities of gait and mobility: Secondary | ICD-10-CM | POA: Diagnosis not present

## 2023-08-21 DIAGNOSIS — R41841 Cognitive communication deficit: Secondary | ICD-10-CM | POA: Diagnosis not present

## 2023-08-21 DIAGNOSIS — R4185 Anosognosia: Secondary | ICD-10-CM | POA: Diagnosis not present

## 2023-08-21 DIAGNOSIS — R488 Other symbolic dysfunctions: Secondary | ICD-10-CM | POA: Diagnosis not present

## 2023-08-21 DIAGNOSIS — R2681 Unsteadiness on feet: Secondary | ICD-10-CM | POA: Diagnosis not present

## 2023-08-21 DIAGNOSIS — M6259 Muscle wasting and atrophy, not elsewhere classified, multiple sites: Secondary | ICD-10-CM | POA: Diagnosis not present

## 2023-08-22 DIAGNOSIS — M6259 Muscle wasting and atrophy, not elsewhere classified, multiple sites: Secondary | ICD-10-CM | POA: Diagnosis not present

## 2023-08-22 DIAGNOSIS — R41841 Cognitive communication deficit: Secondary | ICD-10-CM | POA: Diagnosis not present

## 2023-08-22 DIAGNOSIS — R4185 Anosognosia: Secondary | ICD-10-CM | POA: Diagnosis not present

## 2023-08-22 DIAGNOSIS — R2689 Other abnormalities of gait and mobility: Secondary | ICD-10-CM | POA: Diagnosis not present

## 2023-08-22 DIAGNOSIS — R488 Other symbolic dysfunctions: Secondary | ICD-10-CM | POA: Diagnosis not present

## 2023-08-22 DIAGNOSIS — R2681 Unsteadiness on feet: Secondary | ICD-10-CM | POA: Diagnosis not present

## 2023-08-23 DIAGNOSIS — M6259 Muscle wasting and atrophy, not elsewhere classified, multiple sites: Secondary | ICD-10-CM | POA: Diagnosis not present

## 2023-08-23 DIAGNOSIS — R4185 Anosognosia: Secondary | ICD-10-CM | POA: Diagnosis not present

## 2023-08-23 DIAGNOSIS — R41841 Cognitive communication deficit: Secondary | ICD-10-CM | POA: Diagnosis not present

## 2023-08-23 DIAGNOSIS — R2681 Unsteadiness on feet: Secondary | ICD-10-CM | POA: Diagnosis not present

## 2023-08-23 DIAGNOSIS — R2689 Other abnormalities of gait and mobility: Secondary | ICD-10-CM | POA: Diagnosis not present

## 2023-08-23 DIAGNOSIS — R488 Other symbolic dysfunctions: Secondary | ICD-10-CM | POA: Diagnosis not present

## 2023-08-26 DIAGNOSIS — H18593 Other hereditary corneal dystrophies, bilateral: Secondary | ICD-10-CM | POA: Diagnosis not present

## 2023-08-26 DIAGNOSIS — H35372 Puckering of macula, left eye: Secondary | ICD-10-CM | POA: Diagnosis not present

## 2023-08-26 DIAGNOSIS — Z961 Presence of intraocular lens: Secondary | ICD-10-CM | POA: Diagnosis not present

## 2023-08-26 DIAGNOSIS — D492 Neoplasm of unspecified behavior of bone, soft tissue, and skin: Secondary | ICD-10-CM | POA: Diagnosis not present

## 2023-08-26 DIAGNOSIS — R41841 Cognitive communication deficit: Secondary | ICD-10-CM | POA: Diagnosis not present

## 2023-08-26 DIAGNOSIS — R2681 Unsteadiness on feet: Secondary | ICD-10-CM | POA: Diagnosis not present

## 2023-08-26 DIAGNOSIS — E119 Type 2 diabetes mellitus without complications: Secondary | ICD-10-CM | POA: Diagnosis not present

## 2023-08-26 DIAGNOSIS — R2689 Other abnormalities of gait and mobility: Secondary | ICD-10-CM | POA: Diagnosis not present

## 2023-08-26 DIAGNOSIS — R488 Other symbolic dysfunctions: Secondary | ICD-10-CM | POA: Diagnosis not present

## 2023-08-26 DIAGNOSIS — M6259 Muscle wasting and atrophy, not elsewhere classified, multiple sites: Secondary | ICD-10-CM | POA: Diagnosis not present

## 2023-08-26 DIAGNOSIS — R4185 Anosognosia: Secondary | ICD-10-CM | POA: Diagnosis not present

## 2023-08-27 DIAGNOSIS — R2689 Other abnormalities of gait and mobility: Secondary | ICD-10-CM | POA: Diagnosis not present

## 2023-08-27 DIAGNOSIS — R4185 Anosognosia: Secondary | ICD-10-CM | POA: Diagnosis not present

## 2023-08-27 DIAGNOSIS — R41841 Cognitive communication deficit: Secondary | ICD-10-CM | POA: Diagnosis not present

## 2023-08-27 DIAGNOSIS — R488 Other symbolic dysfunctions: Secondary | ICD-10-CM | POA: Diagnosis not present

## 2023-08-27 DIAGNOSIS — M6259 Muscle wasting and atrophy, not elsewhere classified, multiple sites: Secondary | ICD-10-CM | POA: Diagnosis not present

## 2023-08-27 DIAGNOSIS — R2681 Unsteadiness on feet: Secondary | ICD-10-CM | POA: Diagnosis not present

## 2023-08-28 DIAGNOSIS — R2681 Unsteadiness on feet: Secondary | ICD-10-CM | POA: Diagnosis not present

## 2023-08-28 DIAGNOSIS — R4185 Anosognosia: Secondary | ICD-10-CM | POA: Diagnosis not present

## 2023-08-28 DIAGNOSIS — R41841 Cognitive communication deficit: Secondary | ICD-10-CM | POA: Diagnosis not present

## 2023-08-28 DIAGNOSIS — R2689 Other abnormalities of gait and mobility: Secondary | ICD-10-CM | POA: Diagnosis not present

## 2023-08-28 DIAGNOSIS — M6259 Muscle wasting and atrophy, not elsewhere classified, multiple sites: Secondary | ICD-10-CM | POA: Diagnosis not present

## 2023-08-28 DIAGNOSIS — R488 Other symbolic dysfunctions: Secondary | ICD-10-CM | POA: Diagnosis not present

## 2023-08-30 DIAGNOSIS — R41841 Cognitive communication deficit: Secondary | ICD-10-CM | POA: Diagnosis not present

## 2023-08-30 DIAGNOSIS — R2681 Unsteadiness on feet: Secondary | ICD-10-CM | POA: Diagnosis not present

## 2023-08-30 DIAGNOSIS — M6259 Muscle wasting and atrophy, not elsewhere classified, multiple sites: Secondary | ICD-10-CM | POA: Diagnosis not present

## 2023-08-30 DIAGNOSIS — R488 Other symbolic dysfunctions: Secondary | ICD-10-CM | POA: Diagnosis not present

## 2023-08-30 DIAGNOSIS — R2689 Other abnormalities of gait and mobility: Secondary | ICD-10-CM | POA: Diagnosis not present

## 2023-08-30 DIAGNOSIS — R4185 Anosognosia: Secondary | ICD-10-CM | POA: Diagnosis not present

## 2023-09-02 DIAGNOSIS — R2681 Unsteadiness on feet: Secondary | ICD-10-CM | POA: Diagnosis not present

## 2023-09-02 DIAGNOSIS — R488 Other symbolic dysfunctions: Secondary | ICD-10-CM | POA: Diagnosis not present

## 2023-09-02 DIAGNOSIS — M6259 Muscle wasting and atrophy, not elsewhere classified, multiple sites: Secondary | ICD-10-CM | POA: Diagnosis not present

## 2023-09-02 DIAGNOSIS — R41841 Cognitive communication deficit: Secondary | ICD-10-CM | POA: Diagnosis not present

## 2023-09-02 DIAGNOSIS — R4185 Anosognosia: Secondary | ICD-10-CM | POA: Diagnosis not present

## 2023-09-02 DIAGNOSIS — R2689 Other abnormalities of gait and mobility: Secondary | ICD-10-CM | POA: Diagnosis not present

## 2023-09-03 DIAGNOSIS — R4185 Anosognosia: Secondary | ICD-10-CM | POA: Diagnosis not present

## 2023-09-03 DIAGNOSIS — R488 Other symbolic dysfunctions: Secondary | ICD-10-CM | POA: Diagnosis not present

## 2023-09-03 DIAGNOSIS — R2689 Other abnormalities of gait and mobility: Secondary | ICD-10-CM | POA: Diagnosis not present

## 2023-09-03 DIAGNOSIS — R2681 Unsteadiness on feet: Secondary | ICD-10-CM | POA: Diagnosis not present

## 2023-09-03 DIAGNOSIS — R41841 Cognitive communication deficit: Secondary | ICD-10-CM | POA: Diagnosis not present

## 2023-09-03 DIAGNOSIS — M6259 Muscle wasting and atrophy, not elsewhere classified, multiple sites: Secondary | ICD-10-CM | POA: Diagnosis not present

## 2023-09-04 ENCOUNTER — Ambulatory Visit (INDEPENDENT_AMBULATORY_CARE_PROVIDER_SITE_OTHER): Payer: Medicare Other | Admitting: Podiatry

## 2023-09-04 DIAGNOSIS — B351 Tinea unguium: Secondary | ICD-10-CM

## 2023-09-04 DIAGNOSIS — R4185 Anosognosia: Secondary | ICD-10-CM | POA: Diagnosis not present

## 2023-09-04 DIAGNOSIS — M79674 Pain in right toe(s): Secondary | ICD-10-CM

## 2023-09-04 DIAGNOSIS — R41841 Cognitive communication deficit: Secondary | ICD-10-CM | POA: Diagnosis not present

## 2023-09-04 DIAGNOSIS — R2681 Unsteadiness on feet: Secondary | ICD-10-CM | POA: Diagnosis not present

## 2023-09-04 DIAGNOSIS — R488 Other symbolic dysfunctions: Secondary | ICD-10-CM | POA: Diagnosis not present

## 2023-09-04 DIAGNOSIS — M79675 Pain in left toe(s): Secondary | ICD-10-CM | POA: Diagnosis not present

## 2023-09-04 DIAGNOSIS — E119 Type 2 diabetes mellitus without complications: Secondary | ICD-10-CM | POA: Diagnosis not present

## 2023-09-04 DIAGNOSIS — R2689 Other abnormalities of gait and mobility: Secondary | ICD-10-CM | POA: Diagnosis not present

## 2023-09-04 DIAGNOSIS — M6259 Muscle wasting and atrophy, not elsewhere classified, multiple sites: Secondary | ICD-10-CM | POA: Diagnosis not present

## 2023-09-05 ENCOUNTER — Ambulatory Visit: Payer: Medicare Other

## 2023-09-05 DIAGNOSIS — R488 Other symbolic dysfunctions: Secondary | ICD-10-CM | POA: Diagnosis not present

## 2023-09-05 DIAGNOSIS — R4185 Anosognosia: Secondary | ICD-10-CM | POA: Diagnosis not present

## 2023-09-05 DIAGNOSIS — R2681 Unsteadiness on feet: Secondary | ICD-10-CM | POA: Diagnosis not present

## 2023-09-05 DIAGNOSIS — R41841 Cognitive communication deficit: Secondary | ICD-10-CM | POA: Diagnosis not present

## 2023-09-05 DIAGNOSIS — R2689 Other abnormalities of gait and mobility: Secondary | ICD-10-CM | POA: Diagnosis not present

## 2023-09-05 DIAGNOSIS — I428 Other cardiomyopathies: Secondary | ICD-10-CM

## 2023-09-05 DIAGNOSIS — M6259 Muscle wasting and atrophy, not elsewhere classified, multiple sites: Secondary | ICD-10-CM | POA: Diagnosis not present

## 2023-09-05 LAB — CUP PACEART REMOTE DEVICE CHECK
Battery Remaining Longevity: 65 mo
Battery Remaining Percentage: 64 %
Battery Voltage: 2.98 V
Date Time Interrogation Session: 20250122210329
HighPow Impedance: 92 Ohm
Implantable Lead Connection Status: 753985
Implantable Lead Connection Status: 753985
Implantable Lead Implant Date: 20211025
Implantable Lead Implant Date: 20211025
Implantable Lead Location: 753858
Implantable Lead Location: 753860
Implantable Pulse Generator Implant Date: 20211025
Lead Channel Impedance Value: 1450 Ohm
Lead Channel Impedance Value: 480 Ohm
Lead Channel Pacing Threshold Amplitude: 0.625 V
Lead Channel Pacing Threshold Amplitude: 1.25 V
Lead Channel Pacing Threshold Pulse Width: 0.5 ms
Lead Channel Pacing Threshold Pulse Width: 0.5 ms
Lead Channel Sensing Intrinsic Amplitude: 12 mV
Lead Channel Setting Pacing Amplitude: 1.625
Lead Channel Setting Pacing Amplitude: 1.75 V
Lead Channel Setting Pacing Pulse Width: 0.5 ms
Lead Channel Setting Pacing Pulse Width: 0.5 ms
Lead Channel Setting Sensing Sensitivity: 0.5 mV
Pulse Gen Serial Number: 810006964

## 2023-09-06 DIAGNOSIS — R41841 Cognitive communication deficit: Secondary | ICD-10-CM | POA: Diagnosis not present

## 2023-09-06 DIAGNOSIS — R2689 Other abnormalities of gait and mobility: Secondary | ICD-10-CM | POA: Diagnosis not present

## 2023-09-06 DIAGNOSIS — R4185 Anosognosia: Secondary | ICD-10-CM | POA: Diagnosis not present

## 2023-09-06 DIAGNOSIS — M6259 Muscle wasting and atrophy, not elsewhere classified, multiple sites: Secondary | ICD-10-CM | POA: Diagnosis not present

## 2023-09-06 DIAGNOSIS — R488 Other symbolic dysfunctions: Secondary | ICD-10-CM | POA: Diagnosis not present

## 2023-09-06 DIAGNOSIS — R2681 Unsteadiness on feet: Secondary | ICD-10-CM | POA: Diagnosis not present

## 2023-09-08 DIAGNOSIS — R4185 Anosognosia: Secondary | ICD-10-CM | POA: Diagnosis not present

## 2023-09-08 DIAGNOSIS — R2689 Other abnormalities of gait and mobility: Secondary | ICD-10-CM | POA: Diagnosis not present

## 2023-09-08 DIAGNOSIS — M6259 Muscle wasting and atrophy, not elsewhere classified, multiple sites: Secondary | ICD-10-CM | POA: Diagnosis not present

## 2023-09-08 DIAGNOSIS — R488 Other symbolic dysfunctions: Secondary | ICD-10-CM | POA: Diagnosis not present

## 2023-09-08 DIAGNOSIS — R2681 Unsteadiness on feet: Secondary | ICD-10-CM | POA: Diagnosis not present

## 2023-09-08 DIAGNOSIS — R41841 Cognitive communication deficit: Secondary | ICD-10-CM | POA: Diagnosis not present

## 2023-09-09 DIAGNOSIS — R2681 Unsteadiness on feet: Secondary | ICD-10-CM | POA: Diagnosis not present

## 2023-09-09 DIAGNOSIS — D2310 Other benign neoplasm of skin of unspecified eyelid, including canthus: Secondary | ICD-10-CM | POA: Diagnosis not present

## 2023-09-09 DIAGNOSIS — R41841 Cognitive communication deficit: Secondary | ICD-10-CM | POA: Diagnosis not present

## 2023-09-09 DIAGNOSIS — R2689 Other abnormalities of gait and mobility: Secondary | ICD-10-CM | POA: Diagnosis not present

## 2023-09-09 DIAGNOSIS — M6259 Muscle wasting and atrophy, not elsewhere classified, multiple sites: Secondary | ICD-10-CM | POA: Diagnosis not present

## 2023-09-09 DIAGNOSIS — R4185 Anosognosia: Secondary | ICD-10-CM | POA: Diagnosis not present

## 2023-09-09 DIAGNOSIS — D492 Neoplasm of unspecified behavior of bone, soft tissue, and skin: Secondary | ICD-10-CM | POA: Diagnosis not present

## 2023-09-09 DIAGNOSIS — R488 Other symbolic dysfunctions: Secondary | ICD-10-CM | POA: Diagnosis not present

## 2023-09-10 DIAGNOSIS — R488 Other symbolic dysfunctions: Secondary | ICD-10-CM | POA: Diagnosis not present

## 2023-09-10 DIAGNOSIS — M6259 Muscle wasting and atrophy, not elsewhere classified, multiple sites: Secondary | ICD-10-CM | POA: Diagnosis not present

## 2023-09-10 DIAGNOSIS — R4185 Anosognosia: Secondary | ICD-10-CM | POA: Diagnosis not present

## 2023-09-10 DIAGNOSIS — R41841 Cognitive communication deficit: Secondary | ICD-10-CM | POA: Diagnosis not present

## 2023-09-10 DIAGNOSIS — R2689 Other abnormalities of gait and mobility: Secondary | ICD-10-CM | POA: Diagnosis not present

## 2023-09-10 DIAGNOSIS — R2681 Unsteadiness on feet: Secondary | ICD-10-CM | POA: Diagnosis not present

## 2023-09-11 DIAGNOSIS — L814 Other melanin hyperpigmentation: Secondary | ICD-10-CM | POA: Diagnosis not present

## 2023-09-11 DIAGNOSIS — R2681 Unsteadiness on feet: Secondary | ICD-10-CM | POA: Diagnosis not present

## 2023-09-11 DIAGNOSIS — R4185 Anosognosia: Secondary | ICD-10-CM | POA: Diagnosis not present

## 2023-09-11 DIAGNOSIS — R488 Other symbolic dysfunctions: Secondary | ICD-10-CM | POA: Diagnosis not present

## 2023-09-11 DIAGNOSIS — M6259 Muscle wasting and atrophy, not elsewhere classified, multiple sites: Secondary | ICD-10-CM | POA: Diagnosis not present

## 2023-09-11 DIAGNOSIS — D1801 Hemangioma of skin and subcutaneous tissue: Secondary | ICD-10-CM | POA: Diagnosis not present

## 2023-09-11 DIAGNOSIS — L400 Psoriasis vulgaris: Secondary | ICD-10-CM | POA: Diagnosis not present

## 2023-09-11 DIAGNOSIS — R41841 Cognitive communication deficit: Secondary | ICD-10-CM | POA: Diagnosis not present

## 2023-09-11 DIAGNOSIS — L570 Actinic keratosis: Secondary | ICD-10-CM | POA: Diagnosis not present

## 2023-09-11 DIAGNOSIS — R2689 Other abnormalities of gait and mobility: Secondary | ICD-10-CM | POA: Diagnosis not present

## 2023-09-12 ENCOUNTER — Encounter: Payer: Self-pay | Admitting: Podiatry

## 2023-09-12 DIAGNOSIS — R41841 Cognitive communication deficit: Secondary | ICD-10-CM | POA: Diagnosis not present

## 2023-09-12 DIAGNOSIS — R2689 Other abnormalities of gait and mobility: Secondary | ICD-10-CM | POA: Diagnosis not present

## 2023-09-12 DIAGNOSIS — M6259 Muscle wasting and atrophy, not elsewhere classified, multiple sites: Secondary | ICD-10-CM | POA: Diagnosis not present

## 2023-09-12 DIAGNOSIS — R488 Other symbolic dysfunctions: Secondary | ICD-10-CM | POA: Diagnosis not present

## 2023-09-12 DIAGNOSIS — R2681 Unsteadiness on feet: Secondary | ICD-10-CM | POA: Diagnosis not present

## 2023-09-12 DIAGNOSIS — R4185 Anosognosia: Secondary | ICD-10-CM | POA: Diagnosis not present

## 2023-09-12 NOTE — Progress Notes (Signed)
  Subjective:  Patient ID: Melissa Bishop, female    DOB: May 13, 1946,  MRN: [id number]  77 y.o. female presents to clinic with  preventative diabetic foot care and painful, elongated thickened toenails x 10 which are symptomatic when wearing enclosed shoe gear. This interferes with his/her daily activities. She is accompanied by her Mother and caregiver on today's visit. They reside at [facility].   New problem(s): None   PCP is Gweneth Dimitri, MD.  No Known Allergies  Review of Systems: Negative except as noted in the HPI.   Objective:  Melissa Bishop is a pleasant 78 y.o. female obese in NAD. AAO x 3.  Vascular Examination: Vascular status intact b/l with palpable pedal pulses. CFT immediate b/l. No edema. No pain with calf compression b/l. Skin temperature gradient WNL b/l. No cyanosis or clubbing noted b/l LE.  Neurological Examination: Sensation grossly intact b/l with 10 gram monofilament. Vibratory sensation intact b/l.   Dermatological Examination: Pedal skin with normal turgor, texture and tone b/l. Toenails 1-5 b/l thick, discolored, elongated with subungual debris and pain on dorsal palpation. No hyperkeratotic lesions noted b/l.   Musculoskeletal Examination: Muscle strength 5/5 to b/l LE.  Hammertoe deformity 2-5 b/l.  Radiographs: None  Assessment:   1. Pain due to onychomycosis of toenails of both feet   2. Controlled type 2 diabetes mellitus without complication, without long-term current use of insulin (HCC)    Plan:  -Caregiver/provider present with patient on today's visit. -Care provided by [facility] under my supervision. -Patient to continue soft, supportive shoe gear daily. -Toenails 1-5 bilaterally were debrided in length and girth with sterile nail nippers and dremel. Pinpoint bleeding of left great toe addressed with Lumicain Hemostatic Solution, cleansed with alcohol. No further treatment required by patient/caregiver. -Patient/POA to  call should there be question/concern in the interim.  Return in about 3 months (around 12/03/2023).  Freddie Breech, DPM      [place] LOCATION: [address], [place] [address]                   Office [phone]   [facility] LOCATION: [address] [place], [place] [address] Office [phone]

## 2023-09-16 DIAGNOSIS — R1310 Dysphagia, unspecified: Secondary | ICD-10-CM | POA: Diagnosis not present

## 2023-09-16 DIAGNOSIS — R4789 Other speech disturbances: Secondary | ICD-10-CM | POA: Diagnosis not present

## 2023-09-16 DIAGNOSIS — M6259 Muscle wasting and atrophy, not elsewhere classified, multiple sites: Secondary | ICD-10-CM | POA: Diagnosis not present

## 2023-09-16 DIAGNOSIS — R2689 Other abnormalities of gait and mobility: Secondary | ICD-10-CM | POA: Diagnosis not present

## 2023-09-16 DIAGNOSIS — R4185 Anosognosia: Secondary | ICD-10-CM | POA: Diagnosis not present

## 2023-09-16 DIAGNOSIS — R488 Other symbolic dysfunctions: Secondary | ICD-10-CM | POA: Diagnosis not present

## 2023-09-16 DIAGNOSIS — R41841 Cognitive communication deficit: Secondary | ICD-10-CM | POA: Diagnosis not present

## 2023-09-16 DIAGNOSIS — R2681 Unsteadiness on feet: Secondary | ICD-10-CM | POA: Diagnosis not present

## 2023-09-17 DIAGNOSIS — R4185 Anosognosia: Secondary | ICD-10-CM | POA: Diagnosis not present

## 2023-09-17 DIAGNOSIS — R2681 Unsteadiness on feet: Secondary | ICD-10-CM | POA: Diagnosis not present

## 2023-09-17 DIAGNOSIS — R488 Other symbolic dysfunctions: Secondary | ICD-10-CM | POA: Diagnosis not present

## 2023-09-17 DIAGNOSIS — R41841 Cognitive communication deficit: Secondary | ICD-10-CM | POA: Diagnosis not present

## 2023-09-17 DIAGNOSIS — M6259 Muscle wasting and atrophy, not elsewhere classified, multiple sites: Secondary | ICD-10-CM | POA: Diagnosis not present

## 2023-09-17 DIAGNOSIS — R2689 Other abnormalities of gait and mobility: Secondary | ICD-10-CM | POA: Diagnosis not present

## 2023-09-18 DIAGNOSIS — R488 Other symbolic dysfunctions: Secondary | ICD-10-CM | POA: Diagnosis not present

## 2023-09-18 DIAGNOSIS — M6259 Muscle wasting and atrophy, not elsewhere classified, multiple sites: Secondary | ICD-10-CM | POA: Diagnosis not present

## 2023-09-18 DIAGNOSIS — R2681 Unsteadiness on feet: Secondary | ICD-10-CM | POA: Diagnosis not present

## 2023-09-18 DIAGNOSIS — R41841 Cognitive communication deficit: Secondary | ICD-10-CM | POA: Diagnosis not present

## 2023-09-18 DIAGNOSIS — R2689 Other abnormalities of gait and mobility: Secondary | ICD-10-CM | POA: Diagnosis not present

## 2023-09-18 DIAGNOSIS — R4185 Anosognosia: Secondary | ICD-10-CM | POA: Diagnosis not present

## 2023-09-20 DIAGNOSIS — R2681 Unsteadiness on feet: Secondary | ICD-10-CM | POA: Diagnosis not present

## 2023-09-20 DIAGNOSIS — R488 Other symbolic dysfunctions: Secondary | ICD-10-CM | POA: Diagnosis not present

## 2023-09-20 DIAGNOSIS — R2689 Other abnormalities of gait and mobility: Secondary | ICD-10-CM | POA: Diagnosis not present

## 2023-09-20 DIAGNOSIS — R4185 Anosognosia: Secondary | ICD-10-CM | POA: Diagnosis not present

## 2023-09-20 DIAGNOSIS — R41841 Cognitive communication deficit: Secondary | ICD-10-CM | POA: Diagnosis not present

## 2023-09-20 DIAGNOSIS — M6259 Muscle wasting and atrophy, not elsewhere classified, multiple sites: Secondary | ICD-10-CM | POA: Diagnosis not present

## 2023-09-23 DIAGNOSIS — R488 Other symbolic dysfunctions: Secondary | ICD-10-CM | POA: Diagnosis not present

## 2023-09-23 DIAGNOSIS — R2689 Other abnormalities of gait and mobility: Secondary | ICD-10-CM | POA: Diagnosis not present

## 2023-09-23 DIAGNOSIS — R2681 Unsteadiness on feet: Secondary | ICD-10-CM | POA: Diagnosis not present

## 2023-09-23 DIAGNOSIS — M6259 Muscle wasting and atrophy, not elsewhere classified, multiple sites: Secondary | ICD-10-CM | POA: Diagnosis not present

## 2023-09-23 DIAGNOSIS — R4185 Anosognosia: Secondary | ICD-10-CM | POA: Diagnosis not present

## 2023-09-23 DIAGNOSIS — R41841 Cognitive communication deficit: Secondary | ICD-10-CM | POA: Diagnosis not present

## 2023-09-25 DIAGNOSIS — R488 Other symbolic dysfunctions: Secondary | ICD-10-CM | POA: Diagnosis not present

## 2023-09-25 DIAGNOSIS — M6259 Muscle wasting and atrophy, not elsewhere classified, multiple sites: Secondary | ICD-10-CM | POA: Diagnosis not present

## 2023-09-25 DIAGNOSIS — R2681 Unsteadiness on feet: Secondary | ICD-10-CM | POA: Diagnosis not present

## 2023-09-25 DIAGNOSIS — R4185 Anosognosia: Secondary | ICD-10-CM | POA: Diagnosis not present

## 2023-09-25 DIAGNOSIS — R2689 Other abnormalities of gait and mobility: Secondary | ICD-10-CM | POA: Diagnosis not present

## 2023-09-25 DIAGNOSIS — R41841 Cognitive communication deficit: Secondary | ICD-10-CM | POA: Diagnosis not present

## 2023-09-27 DIAGNOSIS — R4185 Anosognosia: Secondary | ICD-10-CM | POA: Diagnosis not present

## 2023-09-27 DIAGNOSIS — M6259 Muscle wasting and atrophy, not elsewhere classified, multiple sites: Secondary | ICD-10-CM | POA: Diagnosis not present

## 2023-09-27 DIAGNOSIS — R2689 Other abnormalities of gait and mobility: Secondary | ICD-10-CM | POA: Diagnosis not present

## 2023-09-27 DIAGNOSIS — R488 Other symbolic dysfunctions: Secondary | ICD-10-CM | POA: Diagnosis not present

## 2023-09-27 DIAGNOSIS — R41841 Cognitive communication deficit: Secondary | ICD-10-CM | POA: Diagnosis not present

## 2023-09-27 DIAGNOSIS — R2681 Unsteadiness on feet: Secondary | ICD-10-CM | POA: Diagnosis not present

## 2023-09-29 DIAGNOSIS — R4185 Anosognosia: Secondary | ICD-10-CM | POA: Diagnosis not present

## 2023-09-29 DIAGNOSIS — R2681 Unsteadiness on feet: Secondary | ICD-10-CM | POA: Diagnosis not present

## 2023-09-29 DIAGNOSIS — R41841 Cognitive communication deficit: Secondary | ICD-10-CM | POA: Diagnosis not present

## 2023-09-29 DIAGNOSIS — M6259 Muscle wasting and atrophy, not elsewhere classified, multiple sites: Secondary | ICD-10-CM | POA: Diagnosis not present

## 2023-09-29 DIAGNOSIS — R488 Other symbolic dysfunctions: Secondary | ICD-10-CM | POA: Diagnosis not present

## 2023-09-29 DIAGNOSIS — R2689 Other abnormalities of gait and mobility: Secondary | ICD-10-CM | POA: Diagnosis not present

## 2023-09-30 DIAGNOSIS — R2689 Other abnormalities of gait and mobility: Secondary | ICD-10-CM | POA: Diagnosis not present

## 2023-09-30 DIAGNOSIS — R488 Other symbolic dysfunctions: Secondary | ICD-10-CM | POA: Diagnosis not present

## 2023-09-30 DIAGNOSIS — H60501 Unspecified acute noninfective otitis externa, right ear: Secondary | ICD-10-CM | POA: Diagnosis not present

## 2023-09-30 DIAGNOSIS — Z6832 Body mass index (BMI) 32.0-32.9, adult: Secondary | ICD-10-CM | POA: Diagnosis not present

## 2023-09-30 DIAGNOSIS — R41841 Cognitive communication deficit: Secondary | ICD-10-CM | POA: Diagnosis not present

## 2023-09-30 DIAGNOSIS — M6259 Muscle wasting and atrophy, not elsewhere classified, multiple sites: Secondary | ICD-10-CM | POA: Diagnosis not present

## 2023-09-30 DIAGNOSIS — R2681 Unsteadiness on feet: Secondary | ICD-10-CM | POA: Diagnosis not present

## 2023-09-30 DIAGNOSIS — R4185 Anosognosia: Secondary | ICD-10-CM | POA: Diagnosis not present

## 2023-10-01 DIAGNOSIS — R2689 Other abnormalities of gait and mobility: Secondary | ICD-10-CM | POA: Diagnosis not present

## 2023-10-01 DIAGNOSIS — R2681 Unsteadiness on feet: Secondary | ICD-10-CM | POA: Diagnosis not present

## 2023-10-01 DIAGNOSIS — R4185 Anosognosia: Secondary | ICD-10-CM | POA: Diagnosis not present

## 2023-10-01 DIAGNOSIS — R488 Other symbolic dysfunctions: Secondary | ICD-10-CM | POA: Diagnosis not present

## 2023-10-01 DIAGNOSIS — M6259 Muscle wasting and atrophy, not elsewhere classified, multiple sites: Secondary | ICD-10-CM | POA: Diagnosis not present

## 2023-10-01 DIAGNOSIS — R41841 Cognitive communication deficit: Secondary | ICD-10-CM | POA: Diagnosis not present

## 2023-10-02 DIAGNOSIS — M6259 Muscle wasting and atrophy, not elsewhere classified, multiple sites: Secondary | ICD-10-CM | POA: Diagnosis not present

## 2023-10-02 DIAGNOSIS — R2681 Unsteadiness on feet: Secondary | ICD-10-CM | POA: Diagnosis not present

## 2023-10-02 DIAGNOSIS — R2689 Other abnormalities of gait and mobility: Secondary | ICD-10-CM | POA: Diagnosis not present

## 2023-10-02 DIAGNOSIS — R41841 Cognitive communication deficit: Secondary | ICD-10-CM | POA: Diagnosis not present

## 2023-10-02 DIAGNOSIS — R488 Other symbolic dysfunctions: Secondary | ICD-10-CM | POA: Diagnosis not present

## 2023-10-02 DIAGNOSIS — R4185 Anosognosia: Secondary | ICD-10-CM | POA: Diagnosis not present

## 2023-10-03 DIAGNOSIS — R4185 Anosognosia: Secondary | ICD-10-CM | POA: Diagnosis not present

## 2023-10-03 DIAGNOSIS — M6259 Muscle wasting and atrophy, not elsewhere classified, multiple sites: Secondary | ICD-10-CM | POA: Diagnosis not present

## 2023-10-03 DIAGNOSIS — R488 Other symbolic dysfunctions: Secondary | ICD-10-CM | POA: Diagnosis not present

## 2023-10-03 DIAGNOSIS — R2681 Unsteadiness on feet: Secondary | ICD-10-CM | POA: Diagnosis not present

## 2023-10-03 DIAGNOSIS — R41841 Cognitive communication deficit: Secondary | ICD-10-CM | POA: Diagnosis not present

## 2023-10-03 DIAGNOSIS — R2689 Other abnormalities of gait and mobility: Secondary | ICD-10-CM | POA: Diagnosis not present

## 2023-10-04 DIAGNOSIS — R2681 Unsteadiness on feet: Secondary | ICD-10-CM | POA: Diagnosis not present

## 2023-10-04 DIAGNOSIS — R2689 Other abnormalities of gait and mobility: Secondary | ICD-10-CM | POA: Diagnosis not present

## 2023-10-04 DIAGNOSIS — R41841 Cognitive communication deficit: Secondary | ICD-10-CM | POA: Diagnosis not present

## 2023-10-04 DIAGNOSIS — M6259 Muscle wasting and atrophy, not elsewhere classified, multiple sites: Secondary | ICD-10-CM | POA: Diagnosis not present

## 2023-10-04 DIAGNOSIS — R4185 Anosognosia: Secondary | ICD-10-CM | POA: Diagnosis not present

## 2023-10-04 DIAGNOSIS — R488 Other symbolic dysfunctions: Secondary | ICD-10-CM | POA: Diagnosis not present

## 2023-10-07 DIAGNOSIS — R2689 Other abnormalities of gait and mobility: Secondary | ICD-10-CM | POA: Diagnosis not present

## 2023-10-07 DIAGNOSIS — R4185 Anosognosia: Secondary | ICD-10-CM | POA: Diagnosis not present

## 2023-10-07 DIAGNOSIS — R41841 Cognitive communication deficit: Secondary | ICD-10-CM | POA: Diagnosis not present

## 2023-10-07 DIAGNOSIS — R2681 Unsteadiness on feet: Secondary | ICD-10-CM | POA: Diagnosis not present

## 2023-10-07 DIAGNOSIS — M6259 Muscle wasting and atrophy, not elsewhere classified, multiple sites: Secondary | ICD-10-CM | POA: Diagnosis not present

## 2023-10-07 DIAGNOSIS — R488 Other symbolic dysfunctions: Secondary | ICD-10-CM | POA: Diagnosis not present

## 2023-10-08 DIAGNOSIS — R488 Other symbolic dysfunctions: Secondary | ICD-10-CM | POA: Diagnosis not present

## 2023-10-08 DIAGNOSIS — R2681 Unsteadiness on feet: Secondary | ICD-10-CM | POA: Diagnosis not present

## 2023-10-08 DIAGNOSIS — M6259 Muscle wasting and atrophy, not elsewhere classified, multiple sites: Secondary | ICD-10-CM | POA: Diagnosis not present

## 2023-10-08 DIAGNOSIS — R41841 Cognitive communication deficit: Secondary | ICD-10-CM | POA: Diagnosis not present

## 2023-10-08 DIAGNOSIS — R2689 Other abnormalities of gait and mobility: Secondary | ICD-10-CM | POA: Diagnosis not present

## 2023-10-08 DIAGNOSIS — R4185 Anosognosia: Secondary | ICD-10-CM | POA: Diagnosis not present

## 2023-10-09 DIAGNOSIS — R41841 Cognitive communication deficit: Secondary | ICD-10-CM | POA: Diagnosis not present

## 2023-10-09 DIAGNOSIS — R2689 Other abnormalities of gait and mobility: Secondary | ICD-10-CM | POA: Diagnosis not present

## 2023-10-09 DIAGNOSIS — R2681 Unsteadiness on feet: Secondary | ICD-10-CM | POA: Diagnosis not present

## 2023-10-09 DIAGNOSIS — M6259 Muscle wasting and atrophy, not elsewhere classified, multiple sites: Secondary | ICD-10-CM | POA: Diagnosis not present

## 2023-10-09 DIAGNOSIS — R488 Other symbolic dysfunctions: Secondary | ICD-10-CM | POA: Diagnosis not present

## 2023-10-09 DIAGNOSIS — R4185 Anosognosia: Secondary | ICD-10-CM | POA: Diagnosis not present

## 2023-10-14 DIAGNOSIS — R4789 Other speech disturbances: Secondary | ICD-10-CM | POA: Diagnosis not present

## 2023-10-14 DIAGNOSIS — R2681 Unsteadiness on feet: Secondary | ICD-10-CM | POA: Diagnosis not present

## 2023-10-14 DIAGNOSIS — R4185 Anosognosia: Secondary | ICD-10-CM | POA: Diagnosis not present

## 2023-10-14 DIAGNOSIS — R41841 Cognitive communication deficit: Secondary | ICD-10-CM | POA: Diagnosis not present

## 2023-10-14 DIAGNOSIS — R2689 Other abnormalities of gait and mobility: Secondary | ICD-10-CM | POA: Diagnosis not present

## 2023-10-14 DIAGNOSIS — R488 Other symbolic dysfunctions: Secondary | ICD-10-CM | POA: Diagnosis not present

## 2023-10-14 DIAGNOSIS — M6259 Muscle wasting and atrophy, not elsewhere classified, multiple sites: Secondary | ICD-10-CM | POA: Diagnosis not present

## 2023-10-14 DIAGNOSIS — R1310 Dysphagia, unspecified: Secondary | ICD-10-CM | POA: Diagnosis not present

## 2023-10-15 DIAGNOSIS — R488 Other symbolic dysfunctions: Secondary | ICD-10-CM | POA: Diagnosis not present

## 2023-10-15 DIAGNOSIS — R4185 Anosognosia: Secondary | ICD-10-CM | POA: Diagnosis not present

## 2023-10-15 DIAGNOSIS — R41841 Cognitive communication deficit: Secondary | ICD-10-CM | POA: Diagnosis not present

## 2023-10-15 DIAGNOSIS — R2689 Other abnormalities of gait and mobility: Secondary | ICD-10-CM | POA: Diagnosis not present

## 2023-10-15 DIAGNOSIS — R2681 Unsteadiness on feet: Secondary | ICD-10-CM | POA: Diagnosis not present

## 2023-10-15 DIAGNOSIS — M6259 Muscle wasting and atrophy, not elsewhere classified, multiple sites: Secondary | ICD-10-CM | POA: Diagnosis not present

## 2023-10-16 DIAGNOSIS — R4185 Anosognosia: Secondary | ICD-10-CM | POA: Diagnosis not present

## 2023-10-16 DIAGNOSIS — R488 Other symbolic dysfunctions: Secondary | ICD-10-CM | POA: Diagnosis not present

## 2023-10-16 DIAGNOSIS — R41841 Cognitive communication deficit: Secondary | ICD-10-CM | POA: Diagnosis not present

## 2023-10-16 DIAGNOSIS — R2681 Unsteadiness on feet: Secondary | ICD-10-CM | POA: Diagnosis not present

## 2023-10-16 DIAGNOSIS — M6259 Muscle wasting and atrophy, not elsewhere classified, multiple sites: Secondary | ICD-10-CM | POA: Diagnosis not present

## 2023-10-16 DIAGNOSIS — R2689 Other abnormalities of gait and mobility: Secondary | ICD-10-CM | POA: Diagnosis not present

## 2023-10-17 DIAGNOSIS — M6259 Muscle wasting and atrophy, not elsewhere classified, multiple sites: Secondary | ICD-10-CM | POA: Diagnosis not present

## 2023-10-17 DIAGNOSIS — R4185 Anosognosia: Secondary | ICD-10-CM | POA: Diagnosis not present

## 2023-10-17 DIAGNOSIS — R2689 Other abnormalities of gait and mobility: Secondary | ICD-10-CM | POA: Diagnosis not present

## 2023-10-17 DIAGNOSIS — R488 Other symbolic dysfunctions: Secondary | ICD-10-CM | POA: Diagnosis not present

## 2023-10-17 DIAGNOSIS — R41841 Cognitive communication deficit: Secondary | ICD-10-CM | POA: Diagnosis not present

## 2023-10-17 DIAGNOSIS — R2681 Unsteadiness on feet: Secondary | ICD-10-CM | POA: Diagnosis not present

## 2023-10-17 NOTE — Progress Notes (Signed)
 Remote ICD transmission.

## 2023-10-18 DIAGNOSIS — R2689 Other abnormalities of gait and mobility: Secondary | ICD-10-CM | POA: Diagnosis not present

## 2023-10-18 DIAGNOSIS — R2681 Unsteadiness on feet: Secondary | ICD-10-CM | POA: Diagnosis not present

## 2023-10-18 DIAGNOSIS — R488 Other symbolic dysfunctions: Secondary | ICD-10-CM | POA: Diagnosis not present

## 2023-10-18 DIAGNOSIS — R41841 Cognitive communication deficit: Secondary | ICD-10-CM | POA: Diagnosis not present

## 2023-10-18 DIAGNOSIS — M6259 Muscle wasting and atrophy, not elsewhere classified, multiple sites: Secondary | ICD-10-CM | POA: Diagnosis not present

## 2023-10-18 DIAGNOSIS — R4185 Anosognosia: Secondary | ICD-10-CM | POA: Diagnosis not present

## 2023-10-20 DIAGNOSIS — M6259 Muscle wasting and atrophy, not elsewhere classified, multiple sites: Secondary | ICD-10-CM | POA: Diagnosis not present

## 2023-10-20 DIAGNOSIS — R2689 Other abnormalities of gait and mobility: Secondary | ICD-10-CM | POA: Diagnosis not present

## 2023-10-20 DIAGNOSIS — R2681 Unsteadiness on feet: Secondary | ICD-10-CM | POA: Diagnosis not present

## 2023-10-20 DIAGNOSIS — R41841 Cognitive communication deficit: Secondary | ICD-10-CM | POA: Diagnosis not present

## 2023-10-20 DIAGNOSIS — R488 Other symbolic dysfunctions: Secondary | ICD-10-CM | POA: Diagnosis not present

## 2023-10-20 DIAGNOSIS — R4185 Anosognosia: Secondary | ICD-10-CM | POA: Diagnosis not present

## 2023-10-21 DIAGNOSIS — R2681 Unsteadiness on feet: Secondary | ICD-10-CM | POA: Diagnosis not present

## 2023-10-21 DIAGNOSIS — R2689 Other abnormalities of gait and mobility: Secondary | ICD-10-CM | POA: Diagnosis not present

## 2023-10-21 DIAGNOSIS — R488 Other symbolic dysfunctions: Secondary | ICD-10-CM | POA: Diagnosis not present

## 2023-10-21 DIAGNOSIS — R4185 Anosognosia: Secondary | ICD-10-CM | POA: Diagnosis not present

## 2023-10-21 DIAGNOSIS — M6259 Muscle wasting and atrophy, not elsewhere classified, multiple sites: Secondary | ICD-10-CM | POA: Diagnosis not present

## 2023-10-21 DIAGNOSIS — R41841 Cognitive communication deficit: Secondary | ICD-10-CM | POA: Diagnosis not present

## 2023-10-22 DIAGNOSIS — R41841 Cognitive communication deficit: Secondary | ICD-10-CM | POA: Diagnosis not present

## 2023-10-22 DIAGNOSIS — R4185 Anosognosia: Secondary | ICD-10-CM | POA: Diagnosis not present

## 2023-10-22 DIAGNOSIS — R2681 Unsteadiness on feet: Secondary | ICD-10-CM | POA: Diagnosis not present

## 2023-10-22 DIAGNOSIS — R488 Other symbolic dysfunctions: Secondary | ICD-10-CM | POA: Diagnosis not present

## 2023-10-22 DIAGNOSIS — M6259 Muscle wasting and atrophy, not elsewhere classified, multiple sites: Secondary | ICD-10-CM | POA: Diagnosis not present

## 2023-10-22 DIAGNOSIS — R2689 Other abnormalities of gait and mobility: Secondary | ICD-10-CM | POA: Diagnosis not present

## 2023-10-24 DIAGNOSIS — E119 Type 2 diabetes mellitus without complications: Secondary | ICD-10-CM | POA: Diagnosis not present

## 2023-10-24 DIAGNOSIS — H35363 Drusen (degenerative) of macula, bilateral: Secondary | ICD-10-CM | POA: Diagnosis not present

## 2023-10-24 DIAGNOSIS — H35372 Puckering of macula, left eye: Secondary | ICD-10-CM | POA: Diagnosis not present

## 2023-10-28 DIAGNOSIS — R2681 Unsteadiness on feet: Secondary | ICD-10-CM | POA: Diagnosis not present

## 2023-10-28 DIAGNOSIS — R4185 Anosognosia: Secondary | ICD-10-CM | POA: Diagnosis not present

## 2023-10-28 DIAGNOSIS — R488 Other symbolic dysfunctions: Secondary | ICD-10-CM | POA: Diagnosis not present

## 2023-10-28 DIAGNOSIS — R2689 Other abnormalities of gait and mobility: Secondary | ICD-10-CM | POA: Diagnosis not present

## 2023-10-28 DIAGNOSIS — M6259 Muscle wasting and atrophy, not elsewhere classified, multiple sites: Secondary | ICD-10-CM | POA: Diagnosis not present

## 2023-10-28 DIAGNOSIS — R41841 Cognitive communication deficit: Secondary | ICD-10-CM | POA: Diagnosis not present

## 2023-10-29 DIAGNOSIS — R4185 Anosognosia: Secondary | ICD-10-CM | POA: Diagnosis not present

## 2023-10-29 DIAGNOSIS — R41841 Cognitive communication deficit: Secondary | ICD-10-CM | POA: Diagnosis not present

## 2023-10-29 DIAGNOSIS — M6259 Muscle wasting and atrophy, not elsewhere classified, multiple sites: Secondary | ICD-10-CM | POA: Diagnosis not present

## 2023-10-29 DIAGNOSIS — R2681 Unsteadiness on feet: Secondary | ICD-10-CM | POA: Diagnosis not present

## 2023-10-29 DIAGNOSIS — R488 Other symbolic dysfunctions: Secondary | ICD-10-CM | POA: Diagnosis not present

## 2023-10-29 DIAGNOSIS — R2689 Other abnormalities of gait and mobility: Secondary | ICD-10-CM | POA: Diagnosis not present

## 2023-10-30 DIAGNOSIS — R488 Other symbolic dysfunctions: Secondary | ICD-10-CM | POA: Diagnosis not present

## 2023-10-30 DIAGNOSIS — R2689 Other abnormalities of gait and mobility: Secondary | ICD-10-CM | POA: Diagnosis not present

## 2023-10-30 DIAGNOSIS — R4185 Anosognosia: Secondary | ICD-10-CM | POA: Diagnosis not present

## 2023-10-30 DIAGNOSIS — M6259 Muscle wasting and atrophy, not elsewhere classified, multiple sites: Secondary | ICD-10-CM | POA: Diagnosis not present

## 2023-10-30 DIAGNOSIS — R2681 Unsteadiness on feet: Secondary | ICD-10-CM | POA: Diagnosis not present

## 2023-10-30 DIAGNOSIS — R41841 Cognitive communication deficit: Secondary | ICD-10-CM | POA: Diagnosis not present

## 2023-10-31 DIAGNOSIS — R2689 Other abnormalities of gait and mobility: Secondary | ICD-10-CM | POA: Diagnosis not present

## 2023-10-31 DIAGNOSIS — R41841 Cognitive communication deficit: Secondary | ICD-10-CM | POA: Diagnosis not present

## 2023-10-31 DIAGNOSIS — R2681 Unsteadiness on feet: Secondary | ICD-10-CM | POA: Diagnosis not present

## 2023-10-31 DIAGNOSIS — R4185 Anosognosia: Secondary | ICD-10-CM | POA: Diagnosis not present

## 2023-10-31 DIAGNOSIS — M6259 Muscle wasting and atrophy, not elsewhere classified, multiple sites: Secondary | ICD-10-CM | POA: Diagnosis not present

## 2023-10-31 DIAGNOSIS — R488 Other symbolic dysfunctions: Secondary | ICD-10-CM | POA: Diagnosis not present

## 2023-11-01 DIAGNOSIS — R488 Other symbolic dysfunctions: Secondary | ICD-10-CM | POA: Diagnosis not present

## 2023-11-01 DIAGNOSIS — R2681 Unsteadiness on feet: Secondary | ICD-10-CM | POA: Diagnosis not present

## 2023-11-01 DIAGNOSIS — M6259 Muscle wasting and atrophy, not elsewhere classified, multiple sites: Secondary | ICD-10-CM | POA: Diagnosis not present

## 2023-11-01 DIAGNOSIS — R4185 Anosognosia: Secondary | ICD-10-CM | POA: Diagnosis not present

## 2023-11-01 DIAGNOSIS — R41841 Cognitive communication deficit: Secondary | ICD-10-CM | POA: Diagnosis not present

## 2023-11-01 DIAGNOSIS — R2689 Other abnormalities of gait and mobility: Secondary | ICD-10-CM | POA: Diagnosis not present

## 2023-11-04 DIAGNOSIS — M6259 Muscle wasting and atrophy, not elsewhere classified, multiple sites: Secondary | ICD-10-CM | POA: Diagnosis not present

## 2023-11-04 DIAGNOSIS — R488 Other symbolic dysfunctions: Secondary | ICD-10-CM | POA: Diagnosis not present

## 2023-11-04 DIAGNOSIS — R4185 Anosognosia: Secondary | ICD-10-CM | POA: Diagnosis not present

## 2023-11-04 DIAGNOSIS — R2689 Other abnormalities of gait and mobility: Secondary | ICD-10-CM | POA: Diagnosis not present

## 2023-11-04 DIAGNOSIS — R41841 Cognitive communication deficit: Secondary | ICD-10-CM | POA: Diagnosis not present

## 2023-11-04 DIAGNOSIS — R2681 Unsteadiness on feet: Secondary | ICD-10-CM | POA: Diagnosis not present

## 2023-11-05 DIAGNOSIS — M6259 Muscle wasting and atrophy, not elsewhere classified, multiple sites: Secondary | ICD-10-CM | POA: Diagnosis not present

## 2023-11-05 DIAGNOSIS — R488 Other symbolic dysfunctions: Secondary | ICD-10-CM | POA: Diagnosis not present

## 2023-11-05 DIAGNOSIS — R41841 Cognitive communication deficit: Secondary | ICD-10-CM | POA: Diagnosis not present

## 2023-11-05 DIAGNOSIS — R4185 Anosognosia: Secondary | ICD-10-CM | POA: Diagnosis not present

## 2023-11-05 DIAGNOSIS — R2689 Other abnormalities of gait and mobility: Secondary | ICD-10-CM | POA: Diagnosis not present

## 2023-11-05 DIAGNOSIS — R2681 Unsteadiness on feet: Secondary | ICD-10-CM | POA: Diagnosis not present

## 2023-11-06 DIAGNOSIS — R2681 Unsteadiness on feet: Secondary | ICD-10-CM | POA: Diagnosis not present

## 2023-11-06 DIAGNOSIS — R2689 Other abnormalities of gait and mobility: Secondary | ICD-10-CM | POA: Diagnosis not present

## 2023-11-06 DIAGNOSIS — M6259 Muscle wasting and atrophy, not elsewhere classified, multiple sites: Secondary | ICD-10-CM | POA: Diagnosis not present

## 2023-11-06 DIAGNOSIS — R41841 Cognitive communication deficit: Secondary | ICD-10-CM | POA: Diagnosis not present

## 2023-11-06 DIAGNOSIS — R488 Other symbolic dysfunctions: Secondary | ICD-10-CM | POA: Diagnosis not present

## 2023-11-06 DIAGNOSIS — R4185 Anosognosia: Secondary | ICD-10-CM | POA: Diagnosis not present

## 2023-11-11 DIAGNOSIS — R4185 Anosognosia: Secondary | ICD-10-CM | POA: Diagnosis not present

## 2023-11-11 DIAGNOSIS — M6259 Muscle wasting and atrophy, not elsewhere classified, multiple sites: Secondary | ICD-10-CM | POA: Diagnosis not present

## 2023-11-11 DIAGNOSIS — R41841 Cognitive communication deficit: Secondary | ICD-10-CM | POA: Diagnosis not present

## 2023-11-11 DIAGNOSIS — R488 Other symbolic dysfunctions: Secondary | ICD-10-CM | POA: Diagnosis not present

## 2023-11-11 DIAGNOSIS — R2681 Unsteadiness on feet: Secondary | ICD-10-CM | POA: Diagnosis not present

## 2023-11-11 DIAGNOSIS — R2689 Other abnormalities of gait and mobility: Secondary | ICD-10-CM | POA: Diagnosis not present

## 2023-11-12 DIAGNOSIS — R2681 Unsteadiness on feet: Secondary | ICD-10-CM | POA: Diagnosis not present

## 2023-11-12 DIAGNOSIS — R488 Other symbolic dysfunctions: Secondary | ICD-10-CM | POA: Diagnosis not present

## 2023-11-12 DIAGNOSIS — M6259 Muscle wasting and atrophy, not elsewhere classified, multiple sites: Secondary | ICD-10-CM | POA: Diagnosis not present

## 2023-11-12 DIAGNOSIS — R2689 Other abnormalities of gait and mobility: Secondary | ICD-10-CM | POA: Diagnosis not present

## 2023-11-12 DIAGNOSIS — R41841 Cognitive communication deficit: Secondary | ICD-10-CM | POA: Diagnosis not present

## 2023-11-12 DIAGNOSIS — R4185 Anosognosia: Secondary | ICD-10-CM | POA: Diagnosis not present

## 2023-11-12 DIAGNOSIS — R1310 Dysphagia, unspecified: Secondary | ICD-10-CM | POA: Diagnosis not present

## 2023-11-12 DIAGNOSIS — R4789 Other speech disturbances: Secondary | ICD-10-CM | POA: Diagnosis not present

## 2023-11-13 DIAGNOSIS — R488 Other symbolic dysfunctions: Secondary | ICD-10-CM | POA: Diagnosis not present

## 2023-11-13 DIAGNOSIS — M6259 Muscle wasting and atrophy, not elsewhere classified, multiple sites: Secondary | ICD-10-CM | POA: Diagnosis not present

## 2023-11-13 DIAGNOSIS — R41841 Cognitive communication deficit: Secondary | ICD-10-CM | POA: Diagnosis not present

## 2023-11-13 DIAGNOSIS — R2689 Other abnormalities of gait and mobility: Secondary | ICD-10-CM | POA: Diagnosis not present

## 2023-11-13 DIAGNOSIS — R2681 Unsteadiness on feet: Secondary | ICD-10-CM | POA: Diagnosis not present

## 2023-11-13 DIAGNOSIS — R4185 Anosognosia: Secondary | ICD-10-CM | POA: Diagnosis not present

## 2023-11-14 DIAGNOSIS — R2681 Unsteadiness on feet: Secondary | ICD-10-CM | POA: Diagnosis not present

## 2023-11-14 DIAGNOSIS — R488 Other symbolic dysfunctions: Secondary | ICD-10-CM | POA: Diagnosis not present

## 2023-11-14 DIAGNOSIS — R41841 Cognitive communication deficit: Secondary | ICD-10-CM | POA: Diagnosis not present

## 2023-11-14 DIAGNOSIS — R2689 Other abnormalities of gait and mobility: Secondary | ICD-10-CM | POA: Diagnosis not present

## 2023-11-14 DIAGNOSIS — R4185 Anosognosia: Secondary | ICD-10-CM | POA: Diagnosis not present

## 2023-11-14 DIAGNOSIS — M6259 Muscle wasting and atrophy, not elsewhere classified, multiple sites: Secondary | ICD-10-CM | POA: Diagnosis not present

## 2023-11-15 DIAGNOSIS — M6259 Muscle wasting and atrophy, not elsewhere classified, multiple sites: Secondary | ICD-10-CM | POA: Diagnosis not present

## 2023-11-15 DIAGNOSIS — R2689 Other abnormalities of gait and mobility: Secondary | ICD-10-CM | POA: Diagnosis not present

## 2023-11-15 DIAGNOSIS — R2681 Unsteadiness on feet: Secondary | ICD-10-CM | POA: Diagnosis not present

## 2023-11-15 DIAGNOSIS — R41841 Cognitive communication deficit: Secondary | ICD-10-CM | POA: Diagnosis not present

## 2023-11-15 DIAGNOSIS — R488 Other symbolic dysfunctions: Secondary | ICD-10-CM | POA: Diagnosis not present

## 2023-11-15 DIAGNOSIS — R4185 Anosognosia: Secondary | ICD-10-CM | POA: Diagnosis not present

## 2023-11-19 DIAGNOSIS — R41841 Cognitive communication deficit: Secondary | ICD-10-CM | POA: Diagnosis not present

## 2023-11-19 DIAGNOSIS — R488 Other symbolic dysfunctions: Secondary | ICD-10-CM | POA: Diagnosis not present

## 2023-11-19 DIAGNOSIS — R2681 Unsteadiness on feet: Secondary | ICD-10-CM | POA: Diagnosis not present

## 2023-11-19 DIAGNOSIS — R2689 Other abnormalities of gait and mobility: Secondary | ICD-10-CM | POA: Diagnosis not present

## 2023-11-19 DIAGNOSIS — R4185 Anosognosia: Secondary | ICD-10-CM | POA: Diagnosis not present

## 2023-11-19 DIAGNOSIS — M6259 Muscle wasting and atrophy, not elsewhere classified, multiple sites: Secondary | ICD-10-CM | POA: Diagnosis not present

## 2023-11-20 DIAGNOSIS — R41841 Cognitive communication deficit: Secondary | ICD-10-CM | POA: Diagnosis not present

## 2023-11-20 DIAGNOSIS — R2689 Other abnormalities of gait and mobility: Secondary | ICD-10-CM | POA: Diagnosis not present

## 2023-11-20 DIAGNOSIS — R4185 Anosognosia: Secondary | ICD-10-CM | POA: Diagnosis not present

## 2023-11-20 DIAGNOSIS — R2681 Unsteadiness on feet: Secondary | ICD-10-CM | POA: Diagnosis not present

## 2023-11-20 DIAGNOSIS — M6259 Muscle wasting and atrophy, not elsewhere classified, multiple sites: Secondary | ICD-10-CM | POA: Diagnosis not present

## 2023-11-20 DIAGNOSIS — R488 Other symbolic dysfunctions: Secondary | ICD-10-CM | POA: Diagnosis not present

## 2023-11-20 NOTE — Progress Notes (Deleted)
  Electrophysiology Office Follow up Visit Note:    Date:  11/20/2023   ID:  Brad Mcgaughy, DOB 1946-05-08, MRN [id number]  PCP:  Gweneth Dimitri, MD  [facility] Cardiologist:  Reatha Harps, MD  [facility] Electrophysiologist:  Lanier Prude, MD    Interval History:     Melissa Bishop is a 78 y.o. female who presents for a follow up visit.   She has a history of a CRT-D implant on June 06, 2020 followed by an AV node ablation on July 18, 2020.  She was last seen by Dr. Flora Lipps November 06, 2022.  She was on Eliquis for her atrial fibrillation.  She was doing well at that appointment.      Past medical, surgical, social and family history were reviewed.  ROS:   Please see the history of present illness.    All other systems reviewed and are negative.  EKGs/Labs/Other Studies Reviewed:    The following studies were reviewed today:  November 21, 2023 in clinic device interrogation personally reviewed ***         Physical Exam:    VS:  There were no vitals taken for this visit.    Wt Readings from Last 3 Encounters:  11/06/22 222 lb (100.7 kg)  05/08/22 226 lb 3.2 oz (102.6 kg)  03/26/22 235 lb 0.2 oz (106.6 kg)     GEN: no distress CARD: RRR, No MRG.  CIED pocket well-healed RESP: No IWOB. CTAB.      ASSESSMENT:    No diagnosis found. 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  NYHA class II.  Warm and dry on exam.  Continue medical therapy.  Continue routine follow-up with Dr. Flora Lipps.  #Permanent atrial fibrillation Continue Eliquis for stroke prophylaxis  Follow-up with EP APP in 1 year   Signed, Steffanie Dunn, MD, [facility], [facility] 11/20/2023 9:52 PM    Electrophysiology [facility]

## 2023-11-21 ENCOUNTER — Ambulatory Visit: Payer: Medicare Other | Admitting: Cardiology

## 2023-11-21 DIAGNOSIS — I4821 Permanent atrial fibrillation: Secondary | ICD-10-CM

## 2023-11-21 DIAGNOSIS — R4185 Anosognosia: Secondary | ICD-10-CM | POA: Diagnosis not present

## 2023-11-21 DIAGNOSIS — M6259 Muscle wasting and atrophy, not elsewhere classified, multiple sites: Secondary | ICD-10-CM | POA: Diagnosis not present

## 2023-11-21 DIAGNOSIS — R488 Other symbolic dysfunctions: Secondary | ICD-10-CM | POA: Diagnosis not present

## 2023-11-21 DIAGNOSIS — R41841 Cognitive communication deficit: Secondary | ICD-10-CM | POA: Diagnosis not present

## 2023-11-21 DIAGNOSIS — R2681 Unsteadiness on feet: Secondary | ICD-10-CM | POA: Diagnosis not present

## 2023-11-21 DIAGNOSIS — Z95810 Presence of automatic (implantable) cardiac defibrillator: Secondary | ICD-10-CM

## 2023-11-21 DIAGNOSIS — I5022 Chronic systolic (congestive) heart failure: Secondary | ICD-10-CM

## 2023-11-21 DIAGNOSIS — I5020 Unspecified systolic (congestive) heart failure: Secondary | ICD-10-CM

## 2023-11-21 DIAGNOSIS — R2689 Other abnormalities of gait and mobility: Secondary | ICD-10-CM | POA: Diagnosis not present

## 2023-11-22 DIAGNOSIS — M6259 Muscle wasting and atrophy, not elsewhere classified, multiple sites: Secondary | ICD-10-CM | POA: Diagnosis not present

## 2023-11-22 DIAGNOSIS — R41841 Cognitive communication deficit: Secondary | ICD-10-CM | POA: Diagnosis not present

## 2023-11-22 DIAGNOSIS — R2689 Other abnormalities of gait and mobility: Secondary | ICD-10-CM | POA: Diagnosis not present

## 2023-11-22 DIAGNOSIS — R4185 Anosognosia: Secondary | ICD-10-CM | POA: Diagnosis not present

## 2023-11-22 DIAGNOSIS — R2681 Unsteadiness on feet: Secondary | ICD-10-CM | POA: Diagnosis not present

## 2023-11-22 DIAGNOSIS — R488 Other symbolic dysfunctions: Secondary | ICD-10-CM | POA: Diagnosis not present

## 2023-11-27 DIAGNOSIS — R2681 Unsteadiness on feet: Secondary | ICD-10-CM | POA: Diagnosis not present

## 2023-11-27 DIAGNOSIS — R488 Other symbolic dysfunctions: Secondary | ICD-10-CM | POA: Diagnosis not present

## 2023-11-27 DIAGNOSIS — R41841 Cognitive communication deficit: Secondary | ICD-10-CM | POA: Diagnosis not present

## 2023-11-27 DIAGNOSIS — M6259 Muscle wasting and atrophy, not elsewhere classified, multiple sites: Secondary | ICD-10-CM | POA: Diagnosis not present

## 2023-11-27 DIAGNOSIS — R4185 Anosognosia: Secondary | ICD-10-CM | POA: Diagnosis not present

## 2023-11-27 DIAGNOSIS — R2689 Other abnormalities of gait and mobility: Secondary | ICD-10-CM | POA: Diagnosis not present

## 2023-11-28 DIAGNOSIS — M6259 Muscle wasting and atrophy, not elsewhere classified, multiple sites: Secondary | ICD-10-CM | POA: Diagnosis not present

## 2023-11-28 DIAGNOSIS — R488 Other symbolic dysfunctions: Secondary | ICD-10-CM | POA: Diagnosis not present

## 2023-11-28 DIAGNOSIS — R41841 Cognitive communication deficit: Secondary | ICD-10-CM | POA: Diagnosis not present

## 2023-11-28 DIAGNOSIS — R4185 Anosognosia: Secondary | ICD-10-CM | POA: Diagnosis not present

## 2023-11-28 DIAGNOSIS — R2689 Other abnormalities of gait and mobility: Secondary | ICD-10-CM | POA: Diagnosis not present

## 2023-11-28 DIAGNOSIS — R2681 Unsteadiness on feet: Secondary | ICD-10-CM | POA: Diagnosis not present

## 2023-11-28 NOTE — Progress Notes (Signed)
 Cardiology Office Note:  .   Date:  11/29/2023  ID:  Melissa Bishop, DOB 1945/10/16, MRN [id number] PCP: Helyn Lobstein, MD  [facility] Providers Cardiologist:  Oneil Bigness, MD Electrophysiologist:  Boyce Byes, MD { History of Present Illness: .    Chief Complaint  Patient presents with   Follow-up    Melissa Bishop is a 78 y.o. female with history of CHF, Afib s/p AVN ablation, CRT-D, DM, HLD who presents for follow-up.   History of Present Illness   Melissa Bishop is a 78 year old female with systolic heart failure and AFib status post AV node ablation with CRTD who presents for follow-up.  She has a history of systolic heart failure and atrial fibrillation status post AV node ablation with CRTD. She experiences no chest pain, dyspnea, or fluid retention. Her heart function has improved, and she remains stable on her current medication regimen, which includes metoprolol  succinate 50 mg daily, spironolactone  12.5 mg daily, and Lasix  40 mg daily.  Her diabetes is well-controlled with an A1c of 6.4%, and she takes Jardiance  10 mg daily.  She also has hyperlipidemia and continues to take Lipitor  80 mg and Zetia  10 mg daily. Her cholesterol levels were noted to be stable in December.  She lives independently in a retirement home and describes her living situation as 'pretty good'. She does not drive and relies on mail order for her medications, though she is unsure of the specific pharmacy used.           Problem List  1. Atrial fibrillation, persistent -TEE/DCCV 04/05/2020 failed -converted to NSR on amiodarone   -recurrence on amiodarone   -s/p AVN ablation 07/18/2020 2. Systolic HF -EF 25-30% 04/01/2020 -EF 25-30% 10/21/2020 -arrhythmia related; CRT-D 05/2020 -s/p AVN ablation 07/18/2020 -EF 60-65% 08/24/2021 3. Diabetes -A1c 6.4 4. HLD -T chol 209, HDL 52, LDL 140, TG 94    ROS: All other ROS reviewed and negative. Pertinent positives noted in the HPI.      Studies Reviewed: Aaron Aas   EKG Interpretation Date/Time:  Friday November 29 2023 10:33:47 EDT Ventricular Rate:  73 PR Interval:    QRS Duration:  178 QT Interval:  450 QTC Calculation: 495 R Axis:   249  Text Interpretation: Ventricular-paced rhythm Biventricular pacemaker detected Confirmed by Jackquelyn Mass [phone]) on 11/29/2023 10:39:39 AM   TTE 08/24/2021  1. Left ventricular ejection fraction, by estimation, is 60 to 65%. The  left ventricle has normal function. The left ventricle has no regional  wall motion abnormalities. There is mild left ventricular hypertrophy.  Left ventricular diastolic parameters  were normal.   2. Right ventricular systolic function is normal. The right ventricular  size is normal.   3. Right atrial size was mildly dilated.   4. The mitral valve is grossly normal. Trivial mitral valve  regurgitation.   5. The aortic valve is normal in structure. Aortic valve regurgitation is  not visualized.  Physical Exam:   VS:  BP (!) 140/82   Pulse 73   Ht 5\' 11"  (1.803 m)   Wt 224 lb (101.6 kg)   SpO2 92%   BMI 31.24 kg/m    Wt Readings from Last 3 Encounters:  11/29/23 224 lb (101.6 kg)  11/06/22 222 lb (100.7 kg)  05/08/22 226 lb 3.2 oz (102.6 kg)    GEN: Well nourished, well developed in no acute distress NECK: No JVD; No carotid bruits CARDIAC: RRR, no murmurs, rubs, gallops RESPIRATORY:  Clear to auscultation  without rales, wheezing or rhonchi  ABDOMEN: Soft, non-tender, non-distended EXTREMITIES:  No edema; No deformity  ASSESSMENT AND PLAN: .   Assessment and Plan    Systolic Heart Failure Ejection fraction recovered to 60-65%. No volume overload. Heart function normal on current regimen. - Continue metoprolol  succinate 50 mg daily. - Continue spironolactone  12.5 mg daily. - Continue Lasix  40 mg daily. - jardiance  10 mg daily - was on entresto  but has fallen off list, hold for now  Atrial Fibrillation (AFib) status post AV node  ablation AFib with CRT paced rhythm, well-managed on anticoagulation. - Continue Eliquis  5 mg BID.  Status post Cardiac Resynchronization Therapy Defibrillator (CRTD) Stable pacemaker and defibrillator. Follows with Doctor Marven Slimmer.  Diabetes Mellitus Well-controlled with A1c of 6.4%. - Continue Jardiance  10 mg daily.  Hyperlipidemia Cholesterol stable on current therapy. - Continue Lipitor  80 mg daily. - Continue Zetia  10 mg daily.              Follow-up: Return in about 1 year (around 11/28/2024).  Melodee Spruce T. Rolm Clos, MD, [facility]  [facility]  [address] [place], [place] [address] [phone]  11:07 AM

## 2023-11-29 ENCOUNTER — Ambulatory Visit: Payer: Medicare Other | Attending: Cardiovascular Disease | Admitting: Cardiovascular Disease

## 2023-11-29 ENCOUNTER — Other Ambulatory Visit: Payer: Self-pay | Admitting: *Deleted

## 2023-11-29 ENCOUNTER — Telehealth: Payer: Self-pay | Admitting: Cardiovascular Disease

## 2023-11-29 ENCOUNTER — Encounter: Payer: Self-pay | Admitting: Cardiovascular Disease

## 2023-11-29 VITALS — BP 140/82 | HR 73 | Ht 71.0 in | Wt 224.0 lb

## 2023-11-29 DIAGNOSIS — R4185 Anosognosia: Secondary | ICD-10-CM | POA: Diagnosis not present

## 2023-11-29 DIAGNOSIS — I428 Other cardiomyopathies: Secondary | ICD-10-CM | POA: Diagnosis not present

## 2023-11-29 DIAGNOSIS — I4891 Unspecified atrial fibrillation: Secondary | ICD-10-CM | POA: Diagnosis not present

## 2023-11-29 DIAGNOSIS — M6259 Muscle wasting and atrophy, not elsewhere classified, multiple sites: Secondary | ICD-10-CM | POA: Diagnosis not present

## 2023-11-29 DIAGNOSIS — I5022 Chronic systolic (congestive) heart failure: Secondary | ICD-10-CM | POA: Diagnosis not present

## 2023-11-29 DIAGNOSIS — R2681 Unsteadiness on feet: Secondary | ICD-10-CM | POA: Diagnosis not present

## 2023-11-29 DIAGNOSIS — R2689 Other abnormalities of gait and mobility: Secondary | ICD-10-CM | POA: Diagnosis not present

## 2023-11-29 DIAGNOSIS — R488 Other symbolic dysfunctions: Secondary | ICD-10-CM | POA: Diagnosis not present

## 2023-11-29 DIAGNOSIS — R41841 Cognitive communication deficit: Secondary | ICD-10-CM | POA: Diagnosis not present

## 2023-11-29 NOTE — Telephone Encounter (Signed)
 New Message:     Patient said she was told to call back and let the nurse know where she gets her medicine. She said [facility] [address] Arvella Bird

## 2023-11-29 NOTE — Telephone Encounter (Signed)
 Pharmacy updated to patient chart as requested [facility] [address] [place]  .

## 2023-11-29 NOTE — Patient Instructions (Signed)
 Medication Instructions:   Your physician recommends that you continue on your current medications as directed. Please refer to the Current Medication list given to you today.   *If you need a refill on your cardiac medications before your next appointment, please call your pharmacy*   Lab Work: None    If you have labs (blood work) drawn today and your tests are completely normal, you will receive your results only by: MyChart Message (if you have MyChart) OR A paper copy in the mail If you have any lab test that is abnormal or we need to change your treatment, we will call you to review the results.   Testing/Procedures: None    Follow-Up: At [facility], you and your health needs are our priority.  As part of our continuing mission to provide you with exceptional heart care, we have created designated Provider Care Teams.  These Care Teams include your primary Cardiologist (physician) and Advanced Practice Providers (APPs -  Physician Assistants and Nurse Practitioners) who all work together to provide you with the care you need, when you need it.  We recommend signing up for the patient portal called "MyChart".  Sign up information is provided on this After Visit Summary.  MyChart is used to connect with patients for Virtual Visits (Telemedicine).  Patients are able to view lab/test results, encounter notes, upcoming appointments, etc.  Non-urgent messages can be sent to your provider as well.   To learn more about what you can do with MyChart, go to [email or web address].    Your next appointment:   1 year(s)  The format for your next appointment:   In Person  Provider:   Edd Fabian, FNP, Micah Flesher, PA-C, Marjie Skiff, PA-C, Robet Leu, PA-C, Azalee Course, PA-C, Bernadene Person, NP, or Reather Littler, NP     Other Instructions

## 2023-12-02 DIAGNOSIS — M6259 Muscle wasting and atrophy, not elsewhere classified, multiple sites: Secondary | ICD-10-CM | POA: Diagnosis not present

## 2023-12-02 DIAGNOSIS — R2689 Other abnormalities of gait and mobility: Secondary | ICD-10-CM | POA: Diagnosis not present

## 2023-12-02 DIAGNOSIS — R4185 Anosognosia: Secondary | ICD-10-CM | POA: Diagnosis not present

## 2023-12-02 DIAGNOSIS — R488 Other symbolic dysfunctions: Secondary | ICD-10-CM | POA: Diagnosis not present

## 2023-12-02 DIAGNOSIS — R41841 Cognitive communication deficit: Secondary | ICD-10-CM | POA: Diagnosis not present

## 2023-12-02 DIAGNOSIS — R2681 Unsteadiness on feet: Secondary | ICD-10-CM | POA: Diagnosis not present

## 2023-12-03 DIAGNOSIS — R41841 Cognitive communication deficit: Secondary | ICD-10-CM | POA: Diagnosis not present

## 2023-12-03 DIAGNOSIS — R488 Other symbolic dysfunctions: Secondary | ICD-10-CM | POA: Diagnosis not present

## 2023-12-03 DIAGNOSIS — R2689 Other abnormalities of gait and mobility: Secondary | ICD-10-CM | POA: Diagnosis not present

## 2023-12-03 DIAGNOSIS — R4185 Anosognosia: Secondary | ICD-10-CM | POA: Diagnosis not present

## 2023-12-03 DIAGNOSIS — M6259 Muscle wasting and atrophy, not elsewhere classified, multiple sites: Secondary | ICD-10-CM | POA: Diagnosis not present

## 2023-12-03 DIAGNOSIS — R2681 Unsteadiness on feet: Secondary | ICD-10-CM | POA: Diagnosis not present

## 2023-12-05 ENCOUNTER — Ambulatory Visit (INDEPENDENT_AMBULATORY_CARE_PROVIDER_SITE_OTHER): Payer: Medicare Other

## 2023-12-05 DIAGNOSIS — R2689 Other abnormalities of gait and mobility: Secondary | ICD-10-CM | POA: Diagnosis not present

## 2023-12-05 DIAGNOSIS — R4185 Anosognosia: Secondary | ICD-10-CM | POA: Diagnosis not present

## 2023-12-05 DIAGNOSIS — I428 Other cardiomyopathies: Secondary | ICD-10-CM | POA: Diagnosis not present

## 2023-12-05 DIAGNOSIS — R41841 Cognitive communication deficit: Secondary | ICD-10-CM | POA: Diagnosis not present

## 2023-12-05 DIAGNOSIS — M6259 Muscle wasting and atrophy, not elsewhere classified, multiple sites: Secondary | ICD-10-CM | POA: Diagnosis not present

## 2023-12-05 DIAGNOSIS — R2681 Unsteadiness on feet: Secondary | ICD-10-CM | POA: Diagnosis not present

## 2023-12-05 DIAGNOSIS — R488 Other symbolic dysfunctions: Secondary | ICD-10-CM | POA: Diagnosis not present

## 2023-12-05 LAB — CUP PACEART REMOTE DEVICE CHECK
Battery Remaining Longevity: 62 mo
Battery Remaining Percentage: 61 %
Battery Voltage: 2.98 V
Date Time Interrogation Session: 20250424030110
HighPow Impedance: 97 Ohm
Implantable Lead Connection Status: 753985
Implantable Lead Connection Status: 753985
Implantable Lead Implant Date: 20211025
Implantable Lead Implant Date: 20211025
Implantable Lead Location: 753858
Implantable Lead Location: 753860
Implantable Pulse Generator Implant Date: 20211025
Lead Channel Impedance Value: 1550 Ohm
Lead Channel Impedance Value: 480 Ohm
Lead Channel Pacing Threshold Amplitude: 0.625 V
Lead Channel Pacing Threshold Amplitude: 1.125 V
Lead Channel Pacing Threshold Pulse Width: 0.5 ms
Lead Channel Pacing Threshold Pulse Width: 0.5 ms
Lead Channel Sensing Intrinsic Amplitude: 12 mV
Lead Channel Setting Pacing Amplitude: 1.625
Lead Channel Setting Pacing Amplitude: 1.625
Lead Channel Setting Pacing Pulse Width: 0.5 ms
Lead Channel Setting Pacing Pulse Width: 0.5 ms
Lead Channel Setting Sensing Sensitivity: 0.5 mV
Pulse Gen Serial Number: 810006964

## 2023-12-09 DIAGNOSIS — M6259 Muscle wasting and atrophy, not elsewhere classified, multiple sites: Secondary | ICD-10-CM | POA: Diagnosis not present

## 2023-12-09 DIAGNOSIS — R2681 Unsteadiness on feet: Secondary | ICD-10-CM | POA: Diagnosis not present

## 2023-12-09 DIAGNOSIS — R2689 Other abnormalities of gait and mobility: Secondary | ICD-10-CM | POA: Diagnosis not present

## 2023-12-09 DIAGNOSIS — R4185 Anosognosia: Secondary | ICD-10-CM | POA: Diagnosis not present

## 2023-12-09 DIAGNOSIS — R41841 Cognitive communication deficit: Secondary | ICD-10-CM | POA: Diagnosis not present

## 2023-12-09 DIAGNOSIS — R488 Other symbolic dysfunctions: Secondary | ICD-10-CM | POA: Diagnosis not present

## 2023-12-12 DIAGNOSIS — R1310 Dysphagia, unspecified: Secondary | ICD-10-CM | POA: Diagnosis not present

## 2023-12-12 DIAGNOSIS — R488 Other symbolic dysfunctions: Secondary | ICD-10-CM | POA: Diagnosis not present

## 2023-12-12 DIAGNOSIS — R4789 Other speech disturbances: Secondary | ICD-10-CM | POA: Diagnosis not present

## 2023-12-12 DIAGNOSIS — R4185 Anosognosia: Secondary | ICD-10-CM | POA: Diagnosis not present

## 2023-12-12 DIAGNOSIS — R41841 Cognitive communication deficit: Secondary | ICD-10-CM | POA: Diagnosis not present

## 2023-12-13 DIAGNOSIS — R41841 Cognitive communication deficit: Secondary | ICD-10-CM | POA: Diagnosis not present

## 2023-12-13 DIAGNOSIS — R1310 Dysphagia, unspecified: Secondary | ICD-10-CM | POA: Diagnosis not present

## 2023-12-13 DIAGNOSIS — R4185 Anosognosia: Secondary | ICD-10-CM | POA: Diagnosis not present

## 2023-12-13 DIAGNOSIS — R488 Other symbolic dysfunctions: Secondary | ICD-10-CM | POA: Diagnosis not present

## 2023-12-13 DIAGNOSIS — R4789 Other speech disturbances: Secondary | ICD-10-CM | POA: Diagnosis not present

## 2023-12-16 ENCOUNTER — Encounter: Payer: Self-pay | Admitting: Cardiology

## 2023-12-16 ENCOUNTER — Ambulatory Visit: Attending: Cardiology | Admitting: Cardiology

## 2023-12-16 VITALS — BP 148/70 | HR 74 | Ht 71.0 in | Wt 224.0 lb

## 2023-12-16 DIAGNOSIS — I4821 Permanent atrial fibrillation: Secondary | ICD-10-CM | POA: Diagnosis not present

## 2023-12-16 DIAGNOSIS — I428 Other cardiomyopathies: Secondary | ICD-10-CM | POA: Insufficient documentation

## 2023-12-16 DIAGNOSIS — Z95810 Presence of automatic (implantable) cardiac defibrillator: Secondary | ICD-10-CM | POA: Diagnosis not present

## 2023-12-16 DIAGNOSIS — I5022 Chronic systolic (congestive) heart failure: Secondary | ICD-10-CM | POA: Insufficient documentation

## 2023-12-16 LAB — CUP PACEART INCLINIC DEVICE CHECK
Battery Remaining Longevity: 62 mo
Brady Statistic RA Percent Paced: 0 %
Brady Statistic RV Percent Paced: 99.89 %
Date Time Interrogation Session: 20250505154922
HighPow Impedance: 87.75 Ohm
Implantable Lead Connection Status: 753985
Implantable Lead Connection Status: 753985
Implantable Lead Implant Date: 20211025
Implantable Lead Implant Date: 20211025
Implantable Lead Location: 753858
Implantable Lead Location: 753860
Implantable Pulse Generator Implant Date: 20211025
Lead Channel Impedance Value: 1512.5 Ohm
Lead Channel Impedance Value: 450 Ohm
Lead Channel Pacing Threshold Amplitude: 0.625 V
Lead Channel Pacing Threshold Amplitude: 1.125 V
Lead Channel Pacing Threshold Pulse Width: 0.5 ms
Lead Channel Pacing Threshold Pulse Width: 0.5 ms
Lead Channel Sensing Intrinsic Amplitude: 12 mV
Lead Channel Setting Pacing Amplitude: 1.625
Lead Channel Setting Pacing Amplitude: 1.625
Lead Channel Setting Pacing Pulse Width: 0.5 ms
Lead Channel Setting Pacing Pulse Width: 0.5 ms
Lead Channel Setting Sensing Sensitivity: 0.5 mV
Pulse Gen Serial Number: 810006964

## 2023-12-16 NOTE — Progress Notes (Signed)
  Electrophysiology Office Follow up Visit Note:    Date:  12/16/2023   ID:  Melissa Bishop, DOB Dec 17, 1945, MRN [id number]  PCP:  Helyn Lobstein, MD  [facility] Cardiologist:  Oneil Bigness, MD  [facility] Electrophysiologist:  Boyce Byes, MD    Interval History:     Melissa Bishop is a 78 y.o. female who presents for a follow up visit.   She has a history of a CRT-D implant on June 06, 2020 followed by an AV node ablation on July 18, 2020.  She was last seen by Dr. Rolm Clos November 06, 2022.  She was on Eliquis  for her atrial fibrillation.  She was doing well at that appointment.  She is doing well today.  She is with her mom in clinic today.  No problems with her device that she is aware of.  She continues to take Eliquis  twice daily for stroke prophylaxis.      Past medical, surgical, social and family history were reviewed.  ROS:   Please see the history of present illness.    All other systems reviewed and are negative.  EKGs/Labs/Other Studies Reviewed:    The following studies were reviewed today:  November 21, 2023 in clinic device interrogation personally reviewed Battery and lead parameter stable No programming changes made today Greater than 99% BiV pacing  EKG from November 29, 2023 reviewed personally.  Biventricular pacing.       Physical Exam:    VS:  There were no vitals taken for this visit.    Wt Readings from Last 3 Encounters:  11/29/23 224 lb (101.6 kg)  11/06/22 222 lb (100.7 kg)  05/08/22 226 lb 3.2 oz (102.6 kg)     GEN: no distress CARD: RRR, No MRG.  CIED pocket well-healed RESP: No IWOB. CTAB.      ASSESSMENT:    No diagnosis found. 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  NYHA class II.  Warm and dry on exam.  Continue medical therapy.  Continue routine follow-up with Dr. Rolm Clos.  #Permanent  atrial fibrillation Continue Eliquis  for stroke prophylaxis  Follow-up with EP APP in 1 year   Signed, Harvie Liner, MD, [facility], [facility] 12/16/2023 2:47 PM    Electrophysiology [facility]

## 2023-12-16 NOTE — Patient Instructions (Signed)

## 2023-12-17 DIAGNOSIS — R1310 Dysphagia, unspecified: Secondary | ICD-10-CM | POA: Diagnosis not present

## 2023-12-17 DIAGNOSIS — R41841 Cognitive communication deficit: Secondary | ICD-10-CM | POA: Diagnosis not present

## 2023-12-17 DIAGNOSIS — R4185 Anosognosia: Secondary | ICD-10-CM | POA: Diagnosis not present

## 2023-12-17 DIAGNOSIS — R4789 Other speech disturbances: Secondary | ICD-10-CM | POA: Diagnosis not present

## 2023-12-17 DIAGNOSIS — R488 Other symbolic dysfunctions: Secondary | ICD-10-CM | POA: Diagnosis not present

## 2023-12-18 DIAGNOSIS — R1310 Dysphagia, unspecified: Secondary | ICD-10-CM | POA: Diagnosis not present

## 2023-12-18 DIAGNOSIS — R4789 Other speech disturbances: Secondary | ICD-10-CM | POA: Diagnosis not present

## 2023-12-18 DIAGNOSIS — R4185 Anosognosia: Secondary | ICD-10-CM | POA: Diagnosis not present

## 2023-12-18 DIAGNOSIS — R41841 Cognitive communication deficit: Secondary | ICD-10-CM | POA: Diagnosis not present

## 2023-12-18 DIAGNOSIS — R488 Other symbolic dysfunctions: Secondary | ICD-10-CM | POA: Diagnosis not present

## 2023-12-19 ENCOUNTER — Other Ambulatory Visit (HOSPITAL_COMMUNITY): Payer: Self-pay

## 2023-12-20 DIAGNOSIS — R4789 Other speech disturbances: Secondary | ICD-10-CM | POA: Diagnosis not present

## 2023-12-20 DIAGNOSIS — R1310 Dysphagia, unspecified: Secondary | ICD-10-CM | POA: Diagnosis not present

## 2023-12-20 DIAGNOSIS — R4185 Anosognosia: Secondary | ICD-10-CM | POA: Diagnosis not present

## 2023-12-20 DIAGNOSIS — R41841 Cognitive communication deficit: Secondary | ICD-10-CM | POA: Diagnosis not present

## 2023-12-20 DIAGNOSIS — R488 Other symbolic dysfunctions: Secondary | ICD-10-CM | POA: Diagnosis not present

## 2023-12-23 DIAGNOSIS — R4185 Anosognosia: Secondary | ICD-10-CM | POA: Diagnosis not present

## 2023-12-23 DIAGNOSIS — R1310 Dysphagia, unspecified: Secondary | ICD-10-CM | POA: Diagnosis not present

## 2023-12-23 DIAGNOSIS — R4789 Other speech disturbances: Secondary | ICD-10-CM | POA: Diagnosis not present

## 2023-12-23 DIAGNOSIS — R488 Other symbolic dysfunctions: Secondary | ICD-10-CM | POA: Diagnosis not present

## 2023-12-23 DIAGNOSIS — R41841 Cognitive communication deficit: Secondary | ICD-10-CM | POA: Diagnosis not present

## 2023-12-25 DIAGNOSIS — R41841 Cognitive communication deficit: Secondary | ICD-10-CM | POA: Diagnosis not present

## 2023-12-25 DIAGNOSIS — R1310 Dysphagia, unspecified: Secondary | ICD-10-CM | POA: Diagnosis not present

## 2023-12-25 DIAGNOSIS — R4185 Anosognosia: Secondary | ICD-10-CM | POA: Diagnosis not present

## 2023-12-25 DIAGNOSIS — R4789 Other speech disturbances: Secondary | ICD-10-CM | POA: Diagnosis not present

## 2023-12-25 DIAGNOSIS — R488 Other symbolic dysfunctions: Secondary | ICD-10-CM | POA: Diagnosis not present

## 2023-12-26 DIAGNOSIS — R4789 Other speech disturbances: Secondary | ICD-10-CM | POA: Diagnosis not present

## 2023-12-26 DIAGNOSIS — R41841 Cognitive communication deficit: Secondary | ICD-10-CM | POA: Diagnosis not present

## 2023-12-26 DIAGNOSIS — R4185 Anosognosia: Secondary | ICD-10-CM | POA: Diagnosis not present

## 2023-12-26 DIAGNOSIS — R488 Other symbolic dysfunctions: Secondary | ICD-10-CM | POA: Diagnosis not present

## 2023-12-26 DIAGNOSIS — R1310 Dysphagia, unspecified: Secondary | ICD-10-CM | POA: Diagnosis not present

## 2023-12-30 DIAGNOSIS — R4789 Other speech disturbances: Secondary | ICD-10-CM | POA: Diagnosis not present

## 2023-12-30 DIAGNOSIS — R4185 Anosognosia: Secondary | ICD-10-CM | POA: Diagnosis not present

## 2023-12-30 DIAGNOSIS — R41841 Cognitive communication deficit: Secondary | ICD-10-CM | POA: Diagnosis not present

## 2023-12-30 DIAGNOSIS — R1310 Dysphagia, unspecified: Secondary | ICD-10-CM | POA: Diagnosis not present

## 2023-12-30 DIAGNOSIS — R488 Other symbolic dysfunctions: Secondary | ICD-10-CM | POA: Diagnosis not present

## 2023-12-31 ENCOUNTER — Ambulatory Visit: Payer: Medicare Other | Admitting: Podiatry

## 2024-01-01 DIAGNOSIS — R4185 Anosognosia: Secondary | ICD-10-CM | POA: Diagnosis not present

## 2024-01-01 DIAGNOSIS — R1310 Dysphagia, unspecified: Secondary | ICD-10-CM | POA: Diagnosis not present

## 2024-01-01 DIAGNOSIS — R488 Other symbolic dysfunctions: Secondary | ICD-10-CM | POA: Diagnosis not present

## 2024-01-01 DIAGNOSIS — R41841 Cognitive communication deficit: Secondary | ICD-10-CM | POA: Diagnosis not present

## 2024-01-01 DIAGNOSIS — R4789 Other speech disturbances: Secondary | ICD-10-CM | POA: Diagnosis not present

## 2024-01-04 ENCOUNTER — Emergency Department (HOSPITAL_COMMUNITY)
Admission: EM | Admit: 2024-01-04 | Discharge: 2024-01-05 | Disposition: A | Discharge status: Home / Self Care | Attending: Emergency Medicine | Admitting: Emergency Medicine

## 2024-01-04 ENCOUNTER — Encounter (HOSPITAL_COMMUNITY): Payer: Self-pay

## 2024-01-04 ENCOUNTER — Other Ambulatory Visit: Payer: Self-pay

## 2024-01-04 DIAGNOSIS — E119 Type 2 diabetes mellitus without complications: Secondary | ICD-10-CM | POA: Diagnosis not present

## 2024-01-04 DIAGNOSIS — Z7984 Long term (current) use of oral hypoglycemic drugs: Secondary | ICD-10-CM | POA: Insufficient documentation

## 2024-01-04 DIAGNOSIS — I10 Essential (primary) hypertension: Secondary | ICD-10-CM | POA: Diagnosis not present

## 2024-01-04 DIAGNOSIS — Z79899 Other long term (current) drug therapy: Secondary | ICD-10-CM | POA: Insufficient documentation

## 2024-01-04 DIAGNOSIS — Z7901 Long term (current) use of anticoagulants: Secondary | ICD-10-CM | POA: Insufficient documentation

## 2024-01-04 DIAGNOSIS — H9201 Otalgia, right ear: Secondary | ICD-10-CM | POA: Insufficient documentation

## 2024-01-04 DIAGNOSIS — M799 Soft tissue disorder, unspecified: Secondary | ICD-10-CM | POA: Diagnosis not present

## 2024-01-04 DIAGNOSIS — R509 Fever, unspecified: Secondary | ICD-10-CM | POA: Diagnosis not present

## 2024-01-04 DIAGNOSIS — H6091 Unspecified otitis externa, right ear: Secondary | ICD-10-CM | POA: Diagnosis not present

## 2024-01-04 DIAGNOSIS — H60501 Unspecified acute noninfective otitis externa, right ear: Secondary | ICD-10-CM

## 2024-01-04 LAB — BASIC METABOLIC PANEL WITH GFR
Anion gap: 7 (ref 5–15)
BUN: 21 mg/dL (ref 8–23)
CO2: 29 mmol/L (ref 22–32)
Calcium: 9.4 mg/dL (ref 8.9–10.3)
Chloride: 104 mmol/L (ref 98–111)
Creatinine, Ser: 0.67 mg/dL (ref 0.44–1.00)
GFR, Estimated: >60 mL/min (ref >60)
Glucose, Bld: 143 mg/dL — ABNORMAL HIGH (ref 70–99)
Potassium: 4.4 mmol/L (ref 3.5–5.1)
Sodium: 140 mmol/L (ref 135–145)

## 2024-01-04 LAB — CBC WITH DIFFERENTIAL/PLATELET
Abs Immature Granulocytes: 0.01 10*3/uL (ref 0.00–0.07)
Basophils Absolute: 0 10*3/uL (ref 0.0–0.1)
Basophils Relative: 1 %
Eosinophils Absolute: 0.1 10*3/uL (ref 0.0–0.5)
Eosinophils Relative: 3 %
HCT: 45.3 % (ref 36.0–46.0)
Hemoglobin: 14.3 g/dL (ref 12.0–15.0)
Immature Granulocytes: 0 %
Lymphocytes Relative: 27 %
Lymphs Abs: 1.4 10*3/uL (ref 0.7–4.0)
MCH: 29.7 pg (ref 26.0–34.0)
MCHC: 31.6 g/dL (ref 30.0–36.0)
MCV: 94 fL (ref 80.0–100.0)
Monocytes Absolute: 0.4 10*3/uL (ref 0.1–1.0)
Monocytes Relative: 7 %
Neutro Abs: 3.2 10*3/uL (ref 1.7–7.7)
Neutrophils Relative %: 62 %
Platelets: 229 10*3/uL (ref 150–400)
RBC: 4.82 MIL/uL (ref 3.87–5.11)
RDW: 13.4 % (ref 11.5–15.5)
WBC: 5.2 10*3/uL (ref 4.0–10.5)
nRBC: 0 % (ref 0.0–0.2)

## 2024-01-04 NOTE — ED Provider Notes (Signed)
 [place] EMERGENCY DEPARTMENT AT [facility] Provider Note   CSN: [id number] Arrival date & time: 01/04/24  1834     History Chief Complaint  Patient presents with   Otalgia    Melissa Bishop is a 78 y.o. female patient with diabetes, hypertension, high cholesterol who presents the emergency department with right-sided ear pain has been bothering her for 3 days.  Patient denies any trauma to the ear, drainage to the ear, fever, chills.  She does endorse decreased hearing.  No recent upper respiratory infection.   Otalgia      Home Medications Prior to Admission medications   Medication Sig Start Date End Date Taking? Authorizing Provider  apixaban  (ELIQUIS ) 5 MG TABS tablet Take 1 tablet (5 mg total) by mouth 2 (two) times daily. 04/22/20   O'NealCathay Clonts, MD  atorvastatin  (LIPITOR ) 80 MG tablet Take 80 mg by mouth at bedtime.  12/24/19   [provider]  Cholecalciferol (VITAMIN D3) 50 MCG (2000 UT) TABS Take 2,000 Units by mouth daily.    [provider]  empagliflozin  (JARDIANCE ) 10 MG TABS tablet TAKE 1 TABLET BY MOUTH EVERY DAY BEFORE BREAKFAST 12/31/22   O'Neal, Cathay Clonts, MD  ezetimibe  (ZETIA ) 10 MG tablet Take 10 mg by mouth daily.    [provider]  furosemide  (LASIX ) 40 MG tablet AS NEEDED FOR LE/ANKLE SWELLING 03/27/23   O'Neal, Cathay Clonts, MD  metFORMIN (GLUCOPHAGE-XR) 750 MG 24 hr tablet Take 750 mg by mouth 2 (two) times daily with a meal. 11/03/21   [provider]  metoprolol  succinate (TOPROL -XL) 50 MG 24 hr tablet Take 1 tablet (50 mg total) by mouth daily. 03/26/22   Vann, Jessica U, DO  Multiple Vitamins-Minerals (CENTRUM SILVER 50+WOMEN) TABS Take 1 tablet by mouth daily with breakfast.    [provider]  Multiple Vitamins-Minerals (PRESERVISION AREDS 2) CAPS Take 1 capsule by mouth daily after breakfast.    [provider]  spironolactone  (ALDACTONE ) 25 MG tablet TAKE 1/2 TABLET BY  MOUTH DAILY 03/27/23   O'Neal, Cathay Clonts, MD  STELARA 45 MG/0.5ML SOSY injection Inject into the skin every 3 (three) months. 06/15/20   [provider]      Allergies    Patient has no known allergies.    Review of Systems   Review of Systems  HENT:  Positive for ear pain.   All other systems reviewed and are negative.   Physical Exam Updated Vital Signs BP (!) 175/83   Pulse 73   Temp 98.3 F (36.8 C) (Oral)   Resp 17   SpO2 98%  Physical Exam Vitals and nursing note reviewed.  Constitutional:      Appearance: Normal appearance.  HENT:     Head: Normocephalic and atraumatic.     Ears:     Comments: Left ear is normal.  Normal external auditory canal.  TM is visualized and normal.  Right sided external auditory canal is swollen and edematous.  Unable to visualize TM.  No mastoid tenderness bilaterally. Eyes:     General:        Right eye: No discharge.        Left eye: No discharge.     Conjunctiva/sclera: Conjunctivae normal.  Pulmonary:     Effort: Pulmonary effort is normal.  Skin:    General: Skin is warm and dry.     Findings: No rash.  Neurological:     General: No focal deficit present.  Mental Status: She is alert.  Psychiatric:        Mood and Affect: Mood normal.        Behavior: Behavior normal.     ED Results / Procedures / Treatments   Labs (all labs ordered are listed, but only abnormal results are displayed) Labs Reviewed - No data to display  EKG None  Radiology No results found.  Procedures Procedures  {Document cardiac monitor, telemetry assessment procedure when appropriate:1}  Medications Ordered in ED Medications - No data to display  ED Course/ Medical Decision Making/ A&P   {   Click here for ABCD2, HEART and other calculatorsREFRESH Note before signing :1}                              Medical Decision Making  ***  {Document critical care time when appropriate:1} {Document review of labs and clinical  decision tools ie heart score, Chads2Vasc2 etc:1}  {Document your independent review of radiology images, and any outside records:1} {Document your discussion with family members, caretakers, and with consultants:1} {Document social determinants of health affecting pt's care:1} {Document your decision making why or why not admission, treatments were needed:1} Final Clinical Impression(s) / ED Diagnoses Final diagnoses:  None    Rx / DC Orders ED Discharge Orders     None

## 2024-01-04 NOTE — ED Triage Notes (Signed)
 BIBA for right ear pain and hearing difficulty x 2 days, states 10/10 pain. 160/100 BP 74 HR 171 cbg 96% room air

## 2024-01-05 ENCOUNTER — Emergency Department (HOSPITAL_COMMUNITY)

## 2024-01-05 DIAGNOSIS — R42 Dizziness and giddiness: Secondary | ICD-10-CM | POA: Diagnosis not present

## 2024-01-05 DIAGNOSIS — R1310 Dysphagia, unspecified: Secondary | ICD-10-CM | POA: Diagnosis not present

## 2024-01-05 DIAGNOSIS — R4789 Other speech disturbances: Secondary | ICD-10-CM | POA: Diagnosis not present

## 2024-01-05 DIAGNOSIS — Z7401 Bed confinement status: Secondary | ICD-10-CM | POA: Diagnosis not present

## 2024-01-05 DIAGNOSIS — R41841 Cognitive communication deficit: Secondary | ICD-10-CM | POA: Diagnosis not present

## 2024-01-05 DIAGNOSIS — M799 Soft tissue disorder, unspecified: Secondary | ICD-10-CM | POA: Diagnosis not present

## 2024-01-05 DIAGNOSIS — R4185 Anosognosia: Secondary | ICD-10-CM | POA: Diagnosis not present

## 2024-01-05 DIAGNOSIS — R488 Other symbolic dysfunctions: Secondary | ICD-10-CM | POA: Diagnosis not present

## 2024-01-05 DIAGNOSIS — R531 Weakness: Secondary | ICD-10-CM | POA: Diagnosis not present

## 2024-01-05 MED ORDER — CIPROFLOXACIN-DEXAMETHASONE 0.3-0.1 % OT SUSP
4.0000 [drp] | Freq: Once | OTIC | Status: AC
  Administered 2024-01-05: 4 [drp] via OTIC
  Filled 2024-01-05: qty 7.5

## 2024-01-05 MED ORDER — IOHEXOL 300 MG/ML  SOLN
100.0000 mL | Freq: Once | INTRAMUSCULAR | Status: AC | PRN
  Administered 2024-01-05: 100 mL via INTRAVENOUS

## 2024-01-05 MED ORDER — CIPROFLOXACIN-DEXAMETHASONE 0.3-0.1 % OT SUSP: 4.0000 [drp] | Freq: Two times a day (BID) | OTIC | 0 refills | Status: AC

## 2024-01-05 NOTE — ED Notes (Signed)
 PT waiting for transport back to her facility. Pt is resting comfortably in the wheelchair in no acute distress. PT offered something to eat and drink, but declines at this time. Pt waiting for PTAR

## 2024-01-05 NOTE — Discharge Instructions (Addendum)
 Please instill 4 drops in the right ear morning and evening for the next 5 to 7 days.

## 2024-01-05 NOTE — ED Notes (Signed)
 RN attempted to reach pts mom for transportation, pts mom unable to be reached at this time. Left message to call this writer back.

## 2024-01-05 NOTE — ED Provider Notes (Signed)
 CT negative for evidence of mastoiditis.  Will treat for otitis externa.  I placed an ear wick and gave the patient the first dose of Ciprodex.  She is stable for discharge.   Sherel Dikes, PA-C 01/05/24 0319    Palumbo, April, MD 01/05/24 [phone]

## 2024-01-07 DIAGNOSIS — R4789 Other speech disturbances: Secondary | ICD-10-CM | POA: Diagnosis not present

## 2024-01-07 DIAGNOSIS — R4185 Anosognosia: Secondary | ICD-10-CM | POA: Diagnosis not present

## 2024-01-07 DIAGNOSIS — R1310 Dysphagia, unspecified: Secondary | ICD-10-CM | POA: Diagnosis not present

## 2024-01-07 DIAGNOSIS — R41841 Cognitive communication deficit: Secondary | ICD-10-CM | POA: Diagnosis not present

## 2024-01-07 DIAGNOSIS — R488 Other symbolic dysfunctions: Secondary | ICD-10-CM | POA: Diagnosis not present

## 2024-01-08 DIAGNOSIS — R41841 Cognitive communication deficit: Secondary | ICD-10-CM | POA: Diagnosis not present

## 2024-01-08 DIAGNOSIS — R1310 Dysphagia, unspecified: Secondary | ICD-10-CM | POA: Diagnosis not present

## 2024-01-08 DIAGNOSIS — R4789 Other speech disturbances: Secondary | ICD-10-CM | POA: Diagnosis not present

## 2024-01-08 DIAGNOSIS — R488 Other symbolic dysfunctions: Secondary | ICD-10-CM | POA: Diagnosis not present

## 2024-01-08 DIAGNOSIS — R4185 Anosognosia: Secondary | ICD-10-CM | POA: Diagnosis not present

## 2024-01-14 NOTE — Progress Notes (Signed)
 Remote ICD transmission.

## 2024-01-15 DIAGNOSIS — R4185 Anosognosia: Secondary | ICD-10-CM | POA: Diagnosis not present

## 2024-01-15 DIAGNOSIS — R41841 Cognitive communication deficit: Secondary | ICD-10-CM | POA: Diagnosis not present

## 2024-01-15 DIAGNOSIS — R488 Other symbolic dysfunctions: Secondary | ICD-10-CM | POA: Diagnosis not present

## 2024-01-15 DIAGNOSIS — R4789 Other speech disturbances: Secondary | ICD-10-CM | POA: Diagnosis not present

## 2024-01-15 DIAGNOSIS — L400 Psoriasis vulgaris: Secondary | ICD-10-CM | POA: Diagnosis not present

## 2024-01-15 DIAGNOSIS — R1310 Dysphagia, unspecified: Secondary | ICD-10-CM | POA: Diagnosis not present

## 2024-01-16 DIAGNOSIS — R4185 Anosognosia: Secondary | ICD-10-CM | POA: Diagnosis not present

## 2024-01-16 DIAGNOSIS — R1310 Dysphagia, unspecified: Secondary | ICD-10-CM | POA: Diagnosis not present

## 2024-01-16 DIAGNOSIS — R488 Other symbolic dysfunctions: Secondary | ICD-10-CM | POA: Diagnosis not present

## 2024-01-16 DIAGNOSIS — R41841 Cognitive communication deficit: Secondary | ICD-10-CM | POA: Diagnosis not present

## 2024-01-16 DIAGNOSIS — R4789 Other speech disturbances: Secondary | ICD-10-CM | POA: Diagnosis not present

## 2024-01-22 DIAGNOSIS — R1310 Dysphagia, unspecified: Secondary | ICD-10-CM | POA: Diagnosis not present

## 2024-01-22 DIAGNOSIS — R4185 Anosognosia: Secondary | ICD-10-CM | POA: Diagnosis not present

## 2024-01-22 DIAGNOSIS — R41841 Cognitive communication deficit: Secondary | ICD-10-CM | POA: Diagnosis not present

## 2024-01-22 DIAGNOSIS — R4789 Other speech disturbances: Secondary | ICD-10-CM | POA: Diagnosis not present

## 2024-01-22 DIAGNOSIS — R488 Other symbolic dysfunctions: Secondary | ICD-10-CM | POA: Diagnosis not present

## 2024-01-23 DIAGNOSIS — R4185 Anosognosia: Secondary | ICD-10-CM | POA: Diagnosis not present

## 2024-01-23 DIAGNOSIS — R4789 Other speech disturbances: Secondary | ICD-10-CM | POA: Diagnosis not present

## 2024-01-23 DIAGNOSIS — R488 Other symbolic dysfunctions: Secondary | ICD-10-CM | POA: Diagnosis not present

## 2024-01-23 DIAGNOSIS — R1310 Dysphagia, unspecified: Secondary | ICD-10-CM | POA: Diagnosis not present

## 2024-01-23 DIAGNOSIS — R41841 Cognitive communication deficit: Secondary | ICD-10-CM | POA: Diagnosis not present

## 2024-01-24 DIAGNOSIS — R1310 Dysphagia, unspecified: Secondary | ICD-10-CM | POA: Diagnosis not present

## 2024-01-24 DIAGNOSIS — R41841 Cognitive communication deficit: Secondary | ICD-10-CM | POA: Diagnosis not present

## 2024-01-24 DIAGNOSIS — R4185 Anosognosia: Secondary | ICD-10-CM | POA: Diagnosis not present

## 2024-01-24 DIAGNOSIS — R488 Other symbolic dysfunctions: Secondary | ICD-10-CM | POA: Diagnosis not present

## 2024-01-24 DIAGNOSIS — R4789 Other speech disturbances: Secondary | ICD-10-CM | POA: Diagnosis not present

## 2024-01-27 DIAGNOSIS — R4185 Anosognosia: Secondary | ICD-10-CM | POA: Diagnosis not present

## 2024-01-27 DIAGNOSIS — R4789 Other speech disturbances: Secondary | ICD-10-CM | POA: Diagnosis not present

## 2024-01-27 DIAGNOSIS — R488 Other symbolic dysfunctions: Secondary | ICD-10-CM | POA: Diagnosis not present

## 2024-01-27 DIAGNOSIS — R1310 Dysphagia, unspecified: Secondary | ICD-10-CM | POA: Diagnosis not present

## 2024-01-27 DIAGNOSIS — R41841 Cognitive communication deficit: Secondary | ICD-10-CM | POA: Diagnosis not present

## 2024-02-03 DIAGNOSIS — E1169 Type 2 diabetes mellitus with other specified complication: Secondary | ICD-10-CM | POA: Diagnosis not present

## 2024-02-03 DIAGNOSIS — M810 Age-related osteoporosis without current pathological fracture: Secondary | ICD-10-CM | POA: Diagnosis not present

## 2024-02-03 DIAGNOSIS — R600 Localized edema: Secondary | ICD-10-CM | POA: Diagnosis not present

## 2024-02-03 DIAGNOSIS — I10 Essential (primary) hypertension: Secondary | ICD-10-CM | POA: Diagnosis not present

## 2024-02-03 DIAGNOSIS — Z79899 Other long term (current) drug therapy: Secondary | ICD-10-CM | POA: Diagnosis not present

## 2024-02-03 DIAGNOSIS — R1310 Dysphagia, unspecified: Secondary | ICD-10-CM | POA: Diagnosis not present

## 2024-02-03 DIAGNOSIS — Z6835 Body mass index (BMI) 35.0-35.9, adult: Secondary | ICD-10-CM | POA: Diagnosis not present

## 2024-02-03 DIAGNOSIS — G8929 Other chronic pain: Secondary | ICD-10-CM | POA: Diagnosis not present

## 2024-02-03 DIAGNOSIS — L409 Psoriasis, unspecified: Secondary | ICD-10-CM | POA: Diagnosis not present

## 2024-02-03 DIAGNOSIS — I5022 Chronic systolic (congestive) heart failure: Secondary | ICD-10-CM | POA: Diagnosis not present

## 2024-02-03 DIAGNOSIS — R4789 Other speech disturbances: Secondary | ICD-10-CM | POA: Diagnosis not present

## 2024-02-03 DIAGNOSIS — R488 Other symbolic dysfunctions: Secondary | ICD-10-CM | POA: Diagnosis not present

## 2024-02-03 DIAGNOSIS — R4185 Anosognosia: Secondary | ICD-10-CM | POA: Diagnosis not present

## 2024-02-03 DIAGNOSIS — E785 Hyperlipidemia, unspecified: Secondary | ICD-10-CM | POA: Diagnosis not present

## 2024-02-03 DIAGNOSIS — M25571 Pain in right ankle and joints of right foot: Secondary | ICD-10-CM | POA: Diagnosis not present

## 2024-02-03 DIAGNOSIS — R41841 Cognitive communication deficit: Secondary | ICD-10-CM | POA: Diagnosis not present

## 2024-02-03 DIAGNOSIS — I4821 Permanent atrial fibrillation: Secondary | ICD-10-CM | POA: Diagnosis not present

## 2024-03-05 ENCOUNTER — Ambulatory Visit: Payer: Medicare Other

## 2024-03-05 DIAGNOSIS — I428 Other cardiomyopathies: Secondary | ICD-10-CM | POA: Diagnosis not present

## 2024-03-05 DIAGNOSIS — R600 Localized edema: Secondary | ICD-10-CM | POA: Diagnosis not present

## 2024-03-06 LAB — CUP PACEART REMOTE DEVICE CHECK
Battery Remaining Longevity: 59 mo
Battery Remaining Percentage: 58 %
Battery Voltage: 2.98 V
Date Time Interrogation Session: 20250724020129
HighPow Impedance: 93 Ohm
Implantable Lead Connection Status: 753985
Implantable Lead Connection Status: 753985
Implantable Lead Implant Date: 20211025
Implantable Lead Implant Date: 20211025
Implantable Lead Location: 753858
Implantable Lead Location: 753860
Implantable Pulse Generator Implant Date: 20211025
Lead Channel Impedance Value: 1500 Ohm
Lead Channel Impedance Value: 450 Ohm
Lead Channel Pacing Threshold Amplitude: 0.625 V
Lead Channel Pacing Threshold Amplitude: 1.125 V
Lead Channel Pacing Threshold Pulse Width: 0.5 ms
Lead Channel Pacing Threshold Pulse Width: 0.5 ms
Lead Channel Sensing Intrinsic Amplitude: 12 mV
Lead Channel Setting Pacing Amplitude: 1.625
Lead Channel Setting Pacing Amplitude: 1.625
Lead Channel Setting Pacing Pulse Width: 0.5 ms
Lead Channel Setting Pacing Pulse Width: 0.5 ms
Lead Channel Setting Sensing Sensitivity: 0.5 mV
Pulse Gen Serial Number: 810006964

## 2024-03-09 ENCOUNTER — Other Ambulatory Visit (HOSPITAL_COMMUNITY): Payer: Self-pay | Admitting: Family Medicine

## 2024-03-09 ENCOUNTER — Ambulatory Visit (HOSPITAL_COMMUNITY)
Admission: RE | Admit: 2024-03-09 | Discharge: 2024-03-09 | Disposition: A | Discharge status: Home / Self Care | Source: Ambulatory Visit | Attending: Surgery | Admitting: Surgery

## 2024-03-09 DIAGNOSIS — R600 Localized edema: Secondary | ICD-10-CM | POA: Diagnosis not present

## 2024-03-11 DIAGNOSIS — R600 Localized edema: Secondary | ICD-10-CM | POA: Diagnosis not present

## 2024-03-11 DIAGNOSIS — I4821 Permanent atrial fibrillation: Secondary | ICD-10-CM | POA: Diagnosis not present

## 2024-03-11 DIAGNOSIS — I5022 Chronic systolic (congestive) heart failure: Secondary | ICD-10-CM | POA: Diagnosis not present

## 2024-03-11 DIAGNOSIS — Z6835 Body mass index (BMI) 35.0-35.9, adult: Secondary | ICD-10-CM | POA: Diagnosis not present

## 2024-03-11 DIAGNOSIS — E785 Hyperlipidemia, unspecified: Secondary | ICD-10-CM | POA: Diagnosis not present

## 2024-03-12 ENCOUNTER — Ambulatory Visit: Payer: Self-pay | Admitting: Cardiology

## 2024-04-16 DIAGNOSIS — L738 Other specified follicular disorders: Secondary | ICD-10-CM | POA: Diagnosis not present

## 2024-04-16 DIAGNOSIS — L400 Psoriasis vulgaris: Secondary | ICD-10-CM | POA: Diagnosis not present

## 2024-04-20 DIAGNOSIS — Z23 Encounter for immunization: Secondary | ICD-10-CM | POA: Diagnosis not present

## 2024-04-29 ENCOUNTER — Encounter: Payer: Self-pay | Admitting: Podiatry

## 2024-04-29 ENCOUNTER — Ambulatory Visit (INDEPENDENT_AMBULATORY_CARE_PROVIDER_SITE_OTHER): Admitting: Podiatry

## 2024-04-29 DIAGNOSIS — M79675 Pain in left toe(s): Secondary | ICD-10-CM

## 2024-04-29 DIAGNOSIS — B351 Tinea unguium: Secondary | ICD-10-CM | POA: Diagnosis not present

## 2024-04-29 DIAGNOSIS — G629 Polyneuropathy, unspecified: Secondary | ICD-10-CM

## 2024-04-29 DIAGNOSIS — E119 Type 2 diabetes mellitus without complications: Secondary | ICD-10-CM

## 2024-04-29 DIAGNOSIS — R0989 Other specified symptoms and signs involving the circulatory and respiratory systems: Secondary | ICD-10-CM | POA: Diagnosis not present

## 2024-04-29 DIAGNOSIS — M79674 Pain in right toe(s): Secondary | ICD-10-CM

## 2024-04-29 NOTE — Patient Instructions (Signed)
 To Nursing at [facility]:  Please apply Ammonium Lactate  Lotion 12% to both lower extremities once daily.

## 2024-05-03 ENCOUNTER — Encounter: Payer: Self-pay | Admitting: Podiatry

## 2024-05-03 NOTE — Progress Notes (Signed)
  Subjective:  Patient ID: Melissa Bishop, female    DOB: [birth year],  MRN: [id number]  Melissa Bishop presents to clinic today for at risk foot care with history of diabetic neuropathy and painful thick toenails that are difficult to trim. Pain interferes with ambulation. Aggravating factors include wearing enclosed shoe gear. Pain is relieved with periodic professional debridement.  Chief Complaint  Patient presents with   RFC     RFC Non diabetic toenail trim. LOV with PCP 02/03/24.   New problem(s): None.   PCP is Aisha Harvey, MD.  No Known Allergies  Review of Systems: Negative except as noted in the HPI.  Objective: No changes noted in today's physical examination. There were no vitals filed for this visit. Melissa Bishop is a pleasant 78 y.o. female obese in NAD. AAO x 3.  Vascular Examination: Diminished pulses b/l. Pedal hair absent. CFT < 3 seconds. No edema. No pain with calf compression b/l. Skin temperature gradient WNL b/l. No cyanosis or clubbing noted b/l LE.  Neurological Examination: Sensation grossly intact b/l with 10 gram monofilament. Vibratory sensation intact b/l.   Dermatological Examination: Pedal skin noted to be dry b/l lower extremities. Toenails 1-5 b/l thick, discolored, elongated with subungual debris and pain on dorsal palpation. No hyperkeratotic lesions noted b/l.   Musculoskeletal Examination: Muscle strength 5/5 to b/l LE. Hammertoe deformity 2-5 b/l.  Radiographs: None  Assessment/Plan: 1. Pain due to onychomycosis of toenails of both feet   2. Diminished pulses in lower extremity   3. Peripheral polyneuropathy   4. Controlled type 2 diabetes mellitus without complication, without long-term current use of insulin  (HCC)     VAS US  ABI WITH/WO TBI -Patient was evaluated today. All questions/concerns addressed on today's visit. -Due to risk factor(s), ordered noninvasive arterial studies ABIs with and without TBIs for b/l lower extremities.  Appointment to be scheduled with niece, [name] at [phone]. -Patient/POA to call should there be question/concern in the interim. -Order written for [facility] facility to apply ammonium lactate  12% lotion to both feet once daily. -Patient to continue soft, supportive shoe gear daily. -Toenails 1-5 b/l were debrided in length and girth with sterile nail nippers and dremel without iatrogenic bleeding.  -Patient/POA to call should there be question/concern in the interim.   Return in about 3 months (around 07/29/2024).  [name], DPM      [place] LOCATION: [address], [place] [address]                   Office [phone]   [facility] LOCATION: [address] [place], [place] [address] Office [phone]

## 2024-05-11 DIAGNOSIS — H35363 Drusen (degenerative) of macula, bilateral: Secondary | ICD-10-CM | POA: Diagnosis not present

## 2024-05-11 DIAGNOSIS — H53413 Scotoma involving central area, bilateral: Secondary | ICD-10-CM | POA: Diagnosis not present

## 2024-05-11 DIAGNOSIS — E119 Type 2 diabetes mellitus without complications: Secondary | ICD-10-CM | POA: Diagnosis not present

## 2024-05-11 DIAGNOSIS — H35372 Puckering of macula, left eye: Secondary | ICD-10-CM | POA: Diagnosis not present

## 2024-05-14 NOTE — Progress Notes (Signed)
 Remote ICD Transmission

## 2024-05-19 ENCOUNTER — Other Ambulatory Visit: Payer: Self-pay | Admitting: Family Medicine

## 2024-05-19 DIAGNOSIS — Z1231 Encounter for screening mammogram for malignant neoplasm of breast: Secondary | ICD-10-CM

## 2024-05-20 DIAGNOSIS — Z23 Encounter for immunization: Secondary | ICD-10-CM | POA: Diagnosis not present

## 2024-05-22 ENCOUNTER — Other Ambulatory Visit: Payer: Self-pay | Admitting: Family Medicine

## 2024-05-22 DIAGNOSIS — E041 Nontoxic single thyroid nodule: Secondary | ICD-10-CM

## 2024-05-26 ENCOUNTER — Ambulatory Visit
Admission: RE | Admit: 2024-05-26 | Discharge: 2024-05-26 | Disposition: A | Discharge status: Home / Self Care | Source: Ambulatory Visit | Attending: Family Medicine | Admitting: Family Medicine

## 2024-05-26 DIAGNOSIS — E041 Nontoxic single thyroid nodule: Secondary | ICD-10-CM | POA: Diagnosis not present

## 2024-06-02 ENCOUNTER — Ambulatory Visit (HOSPITAL_COMMUNITY)
Admission: RE | Admit: 2024-06-02 | Discharge: 2024-06-02 | Disposition: A | Discharge status: Home / Self Care | Source: Ambulatory Visit | Attending: Podiatry | Admitting: Podiatry

## 2024-06-02 DIAGNOSIS — R0989 Other specified symptoms and signs involving the circulatory and respiratory systems: Secondary | ICD-10-CM | POA: Diagnosis not present

## 2024-06-03 LAB — VAS US ABI WITH/WO TBI
Left ABI: 1.08
Right ABI: 1.18

## 2024-06-04 ENCOUNTER — Ambulatory Visit: Payer: Medicare Other

## 2024-06-04 DIAGNOSIS — I428 Other cardiomyopathies: Secondary | ICD-10-CM | POA: Diagnosis not present

## 2024-06-04 LAB — CUP PACEART REMOTE DEVICE CHECK
Battery Remaining Longevity: 58 mo
Battery Remaining Percentage: 56 %
Battery Voltage: 2.98 V
Date Time Interrogation Session: 20251023020225
HighPow Impedance: 110 Ohm
Implantable Lead Connection Status: 753985
Implantable Lead Connection Status: 753985
Implantable Lead Implant Date: 20211025
Implantable Lead Implant Date: 20211025
Implantable Lead Location: 753858
Implantable Lead Location: 753860
Implantable Pulse Generator Implant Date: 20211025
Lead Channel Impedance Value: 1675 Ohm
Lead Channel Impedance Value: 490 Ohm
Lead Channel Pacing Threshold Amplitude: 0.625 V
Lead Channel Pacing Threshold Amplitude: 1.25 V
Lead Channel Pacing Threshold Pulse Width: 0.5 ms
Lead Channel Pacing Threshold Pulse Width: 0.5 ms
Lead Channel Sensing Intrinsic Amplitude: 12 mV
Lead Channel Setting Pacing Amplitude: 1.625
Lead Channel Setting Pacing Amplitude: 1.75 V
Lead Channel Setting Pacing Pulse Width: 0.5 ms
Lead Channel Setting Pacing Pulse Width: 0.5 ms
Lead Channel Setting Sensing Sensitivity: 0.5 mV
Pulse Gen Serial Number: 810006964

## 2024-06-05 NOTE — Progress Notes (Signed)
 Remote ICD Transmission

## 2024-06-08 ENCOUNTER — Ambulatory Visit: Payer: Self-pay | Admitting: Cardiology

## 2024-06-08 ENCOUNTER — Other Ambulatory Visit: Payer: Self-pay | Admitting: Family Medicine

## 2024-06-08 DIAGNOSIS — E041 Nontoxic single thyroid nodule: Secondary | ICD-10-CM

## 2024-06-10 DIAGNOSIS — Z91148 Patient's other noncompliance with medication regimen for other reason: Secondary | ICD-10-CM | POA: Diagnosis not present

## 2024-06-10 DIAGNOSIS — R600 Localized edema: Secondary | ICD-10-CM | POA: Diagnosis not present

## 2024-06-10 DIAGNOSIS — Z6834 Body mass index (BMI) 34.0-34.9, adult: Secondary | ICD-10-CM | POA: Diagnosis not present

## 2024-06-10 DIAGNOSIS — I4821 Permanent atrial fibrillation: Secondary | ICD-10-CM | POA: Diagnosis not present

## 2024-06-10 DIAGNOSIS — I5022 Chronic systolic (congestive) heart failure: Secondary | ICD-10-CM | POA: Diagnosis not present

## 2024-06-11 ENCOUNTER — Other Ambulatory Visit (HOSPITAL_COMMUNITY)
Admission: RE | Admit: 2024-06-11 | Discharge: 2024-06-11 | Disposition: A | Discharge status: Home / Self Care | Source: Ambulatory Visit | Attending: Interventional Radiology | Admitting: Interventional Radiology

## 2024-06-11 ENCOUNTER — Ambulatory Visit
Admission: RE | Admit: 2024-06-11 | Discharge: 2024-06-11 | Disposition: A | Discharge status: Home / Self Care | Source: Ambulatory Visit | Attending: Family Medicine | Admitting: Family Medicine

## 2024-06-11 DIAGNOSIS — E041 Nontoxic single thyroid nodule: Secondary | ICD-10-CM

## 2024-06-12 ENCOUNTER — Ambulatory Visit: Payer: Self-pay | Admitting: Podiatry

## 2024-06-15 LAB — CYTOLOGY - NON PAP

## 2024-06-25 ENCOUNTER — Ambulatory Visit
Admission: RE | Admit: 2024-06-25 | Discharge: 2024-06-25 | Disposition: A | Source: Ambulatory Visit | Attending: Family Medicine | Admitting: Family Medicine

## 2024-06-25 DIAGNOSIS — Z1231 Encounter for screening mammogram for malignant neoplasm of breast: Secondary | ICD-10-CM | POA: Diagnosis not present

## 2024-07-22 DIAGNOSIS — L400 Psoriasis vulgaris: Secondary | ICD-10-CM | POA: Diagnosis not present

## 2024-08-19 ENCOUNTER — Encounter: Payer: Self-pay | Admitting: Podiatry

## 2024-08-19 ENCOUNTER — Ambulatory Visit: Admitting: Podiatry

## 2024-08-19 DIAGNOSIS — E1142 Type 2 diabetes mellitus with diabetic polyneuropathy: Secondary | ICD-10-CM | POA: Diagnosis not present

## 2024-08-19 DIAGNOSIS — L853 Xerosis cutis: Secondary | ICD-10-CM | POA: Diagnosis not present

## 2024-08-19 DIAGNOSIS — M79675 Pain in left toe(s): Secondary | ICD-10-CM | POA: Diagnosis not present

## 2024-08-19 DIAGNOSIS — M79674 Pain in right toe(s): Secondary | ICD-10-CM

## 2024-08-19 DIAGNOSIS — S81801A Unspecified open wound, right lower leg, initial encounter: Secondary | ICD-10-CM | POA: Diagnosis not present

## 2024-08-19 DIAGNOSIS — B351 Tinea unguium: Secondary | ICD-10-CM | POA: Diagnosis not present

## 2024-08-19 MED ORDER — DOXYCYCLINE HYCLATE 100 MG PO CAPS: 100.0000 mg | ORAL_CAPSULE | Freq: Two times a day (BID) | ORAL | 0 refills | Status: AC

## 2024-08-19 NOTE — Patient Instructions (Addendum)
" ° °  APPLY CERAVE HEALING OINTMENT TO DRY SKIN ON BOTH LOWER EXTREMITIES.  DRESSING CHANGES RIGHT LEG:   PHARMACY SHOPPING LIST: Saline or Wound Cleanser for cleaning wound 2 x 2 inch sterile gauze for cleaning wound BETADINE SOLUTION (ASK PHARMACIST)   IF PRESCRIBED ORAL ANTIBIOTICS, TAKE ALL MEDICATION AS PRESCRIBED UNTIL ALL ARE GONE.  CLEANSE ULCER WITH SALINE OR WOUND CLEANSER.  DAB DRY WITH GAUZE SPONGE.  APPLY A LIGHT AMOUNT OF BETADINE TO BASE OF ULCER.  APPLY OUTER DRESSING AS INSTRUCTED.  IF YOU EXPERIENCE ANY FEVER, CHILLS, NIGHTSWEATS, NAUSEA OR VOMITING, ELEVATED OR LOW BLOOD SUGARS, REPORT TO EMERGENCY ROOM.  IF YOU EXPERIENCE INCREASED REDNESS, PAIN, SWELLING, DISCOLORATION, ODOR, PUS, DRAINAGE OR WARMTH OF YOUR FOOT, REPORT TO EMERGENCY ROOM. "

## 2024-08-20 NOTE — Progress Notes (Addendum)
 "  Subjective:  Patient ID: Melissa Bishop, female    DOB: 1946/03/10,  MRN: [id number]  79 y.o. female is seen today for at risk foot care with history of diabetic neuropathy and painful mycotic toenails x 10 which interfere with daily activities. Pain is relieved with periodic professional debridement..  New problem:Patient states she has developed painful area on the back of her right leg. Unsure when it started, but her niece, Melissa Bishop, states this is new. Patient has had a couple of lesions on the front of her right leg which was being monitored.  Patient The patient is having no constitutional symptoms, denying fever, chills, anorexia, or weight loss.  Niece, Melissa Bishop, states Aniyla' mom, Melissa Bishop, is in skilled nursing after suffering a fall and fractured femur.   Medications Ordered Prior to Encounter[1]   Allergies[2]   Objective:  Physical Exam: There were no vitals filed for this visit.   [name] is a pleasant 79 y.o. Caucasian female morbidly obese in NAD. AAO x 3.  Vascular Examination: CFT <3 seconds b/l LE. Faintly palpable pedal pulses b/l. Pedal hair absent. No pain with calf compression b/l. No ischemia or gangrene noted b/l LE. No cyanosis or clubbing noted b/l LE.  Dermatological Examination:      Wound Location: posterior aspect right lower 1/3 leg There is a minimal amount of devitalized tissue present in the wound. Wound Measurement: 1.5 x 4.5 x 0.2 cm. Wound Base: Granular/Healthy Peri-wound: Yellow crusted rim Exudate: None: wound tissue dry Blood Loss during debridement: None; area not debrided No odor, no tracking, no tunneling, no underlying fluctuance.  No interdigital macerations noted b/l LE. Toenails 1-5 bilaterally elongated, discolored, dystrophic, thickened, and crumbly with subungual debris and tenderness to dorsal palpation. Pedal skin dry and cracked b/l lower extremities.       Musculoskeletal  Examination: Muscle strength 5/5 to all lower extremity muscle groups bilaterally. Hammertoe deformity noted 2-5 b/l.  Neurological Examination: Pt has subjective symptoms of neuropathy. Protective sensation intact 5/5 intact bilaterally with 10g monofilament b/l.  No results for input(s): GRAMSTAIN, LABORGA in the last 8760 hours.   Lab Results  Component Value Date   WBC 5.2 01/04/2024   HGB 14.3 01/04/2024   HCT 45.3 01/04/2024   MCV 94.0 01/04/2024   PLT 229 01/04/2024    Assessment:   1. Pain due to onychomycosis of toenails of both feet   2. Wound of right leg, initial encounter   3. Xerosis cutis   4. Diabetic peripheral neuropathy associated with type 2 diabetes mellitus (HCC)    Plan:  -Orders on today's visit:  Orders Placed This Encounter  Procedures   AMB referral to wound care center    Referral Priority:   Urgent    Referral Type:   Consultation    Referral Reason:   Specialty Services Required    Referred to Provider:   Marolyn Nest, MD    Number of Visits Requested:   1   Meds ordered this encounter  Medications   doxycycline  (VIBRAMYCIN ) 100 MG capsule    Sig: Take 1 capsule (100 mg total) by mouth 2 (two) times daily for 10 days.    Dispense:  20 capsule    Refill:  0   -Patient was evaluated and treated and all questions answered. Wound assessed by Dr. Asberry Failing. Discussed treatment plan and need for outpatient referral with niece, Melissa Bishop. She related understanding. Patient also has upcoming appointment with her PCP. -For  her xerosis, instructed patient to apply Cerave Healing Ointment to both legs. Niece is not sure if patient will comply. Patient does live in independent living area of [address]. We did discuss Bobbi and her Mom may need more assistance as both of their needs have changed. -Patient/POA/Family member educated on diagnosis and treatment plan for local wound care and referral to wound care clinic.   -Ulceration cleansed with wound cleanser. Betadine Solution applied to base of ulceration and secured with light dressing. -Wound responded well to today's treatment. [name] given written instructions on daily wound care for right lower extremity ulceration. If needed, patient to see Dr. Sikora in 7-10 days for management until patient can be seen by Wound Care. -Rx for Doxycyline 100 mg, #20, to be taken one capsule twice daily for 10 days. -Mycotic toenails 1-5 bilaterally were debrided in length and girth with sterile nail nippers and dremel without incident. -Patient/POA to call should there be question/concern in the interim. Return in about 3 months (around 11/17/2024).  [name], DPM      [place] LOCATION: [address] [place], [place] [address]                   Office [phone]   [place] LOCATION: [address] [place], [place] [address] Office [phone]     [1]  Current Outpatient Medications on File Prior to Visit  Medication Sig Dispense Refill   apixaban  (ELIQUIS ) 5 MG TABS tablet Take 1 tablet (5 mg total) by mouth 2 (two) times daily. 90 tablet 1   atorvastatin  (LIPITOR ) 80 MG tablet Take 80 mg by mouth at bedtime.      Cholecalciferol (VITAMIN D3) 50 MCG (2000 UT) TABS Take 2,000 Units by mouth daily.     ciprofloxacin -dexamethasone  (CIPRODEX ) OTIC suspension Place 4 drops into the right ear 2 (two) times daily. 7.5 mL 0   empagliflozin  (JARDIANCE ) 10 MG TABS tablet TAKE 1 TABLET BY MOUTH EVERY DAY BEFORE BREAKFAST 90 tablet 3   ezetimibe  (ZETIA ) 10 MG tablet Take 10 mg by mouth daily.     furosemide  (LASIX ) 40 MG tablet AS NEEDED FOR LE/ANKLE SWELLING 90 tablet 1   metFORMIN (GLUCOPHAGE-XR) 750 MG 24 hr tablet Take 750 mg by mouth 2 (two) times daily with a meal.     metoprolol  succinate (TOPROL -XL) 50 MG 24 hr tablet Take 1 tablet (50 mg total) by mouth daily. 30 tablet 0    Multiple Vitamins-Minerals (CENTRUM SILVER 50+WOMEN) TABS Take 1 tablet by mouth daily with breakfast.     Multiple Vitamins-Minerals (PRESERVISION AREDS 2) CAPS Take 1 capsule by mouth daily after breakfast.     spironolactone  (ALDACTONE ) 25 MG tablet TAKE 1/2 TABLET BY MOUTH DAILY 45 tablet 3   STELARA 45 MG/0.5ML SOSY injection Inject into the skin every 3 (three) months.     No current facility-administered medications on file prior to visit.  [2] No Known Allergies  "

## 2024-08-25 ENCOUNTER — Encounter (HOSPITAL_BASED_OUTPATIENT_CLINIC_OR_DEPARTMENT_OTHER): Attending: General Surgery | Admitting: General Surgery

## 2024-08-25 DIAGNOSIS — E11622 Type 2 diabetes mellitus with other skin ulcer: Secondary | ICD-10-CM | POA: Diagnosis present

## 2024-08-25 DIAGNOSIS — L97812 Non-pressure chronic ulcer of other part of right lower leg with fat layer exposed: Secondary | ICD-10-CM | POA: Diagnosis not present

## 2024-08-25 DIAGNOSIS — L97822 Non-pressure chronic ulcer of other part of left lower leg with fat layer exposed: Secondary | ICD-10-CM | POA: Insufficient documentation

## 2024-08-25 DIAGNOSIS — I5020 Unspecified systolic (congestive) heart failure: Secondary | ICD-10-CM | POA: Insufficient documentation

## 2024-08-25 DIAGNOSIS — I872 Venous insufficiency (chronic) (peripheral): Secondary | ICD-10-CM | POA: Diagnosis not present

## 2024-08-25 DIAGNOSIS — R600 Localized edema: Secondary | ICD-10-CM | POA: Insufficient documentation

## 2024-08-31 ENCOUNTER — Encounter (HOSPITAL_BASED_OUTPATIENT_CLINIC_OR_DEPARTMENT_OTHER): Admitting: General Surgery

## 2024-08-31 DIAGNOSIS — E11622 Type 2 diabetes mellitus with other skin ulcer: Secondary | ICD-10-CM | POA: Diagnosis not present

## 2024-09-02 ENCOUNTER — Ambulatory Visit: Admitting: Podiatry

## 2024-09-03 ENCOUNTER — Ambulatory Visit

## 2024-09-03 DIAGNOSIS — I428 Other cardiomyopathies: Secondary | ICD-10-CM | POA: Diagnosis not present

## 2024-09-03 LAB — CUP PACEART REMOTE DEVICE CHECK
Battery Remaining Longevity: 55 mo
Battery Remaining Percentage: 54 %
Battery Voltage: 2.98 V
Date Time Interrogation Session: 20260122020144
HighPow Impedance: 115 Ohm
Implantable Lead Connection Status: 753985
Implantable Lead Connection Status: 753985
Implantable Lead Implant Date: 20211025
Implantable Lead Implant Date: 20211025
Implantable Lead Location: 753858
Implantable Lead Location: 753860
Implantable Pulse Generator Implant Date: 20211025
Lead Channel Impedance Value: 1725 Ohm
Lead Channel Impedance Value: 490 Ohm
Lead Channel Pacing Threshold Amplitude: 0.75 V
Lead Channel Pacing Threshold Amplitude: 2 V
Lead Channel Pacing Threshold Pulse Width: 0.5 ms
Lead Channel Pacing Threshold Pulse Width: 0.5 ms
Lead Channel Sensing Intrinsic Amplitude: 12 mV
Lead Channel Setting Pacing Amplitude: 1.75 V
Lead Channel Setting Pacing Amplitude: 2.5 V
Lead Channel Setting Pacing Pulse Width: 0.5 ms
Lead Channel Setting Pacing Pulse Width: 0.5 ms
Lead Channel Setting Sensing Sensitivity: 0.5 mV
Pulse Gen Serial Number: 810006964

## 2024-09-04 ENCOUNTER — Ambulatory Visit: Payer: Self-pay | Admitting: Cardiology

## 2024-09-05 NOTE — Progress Notes (Signed)
 Remote ICD Transmission

## 2024-12-01 ENCOUNTER — Ambulatory Visit: Admitting: Podiatry

## 2024-12-03 ENCOUNTER — Ambulatory Visit

## 2025-03-04 ENCOUNTER — Ambulatory Visit

## 2025-06-03 ENCOUNTER — Ambulatory Visit

## 2025-09-02 ENCOUNTER — Ambulatory Visit

## 2025-12-02 ENCOUNTER — Ambulatory Visit
